# Patient Record
Sex: Female | Born: 1955 | Race: White | Hispanic: No | Smoking: Former smoker
Health system: Southern US, Community
[De-identification: ages and names within clinical notes are randomized; demographics above are authoritative.]

## PROBLEM LIST (undated history)

## (undated) DIAGNOSIS — M48061 Spinal stenosis, lumbar region without neurogenic claudication: Secondary | ICD-10-CM

## (undated) DIAGNOSIS — C4490 Unspecified malignant neoplasm of skin, unspecified: Secondary | ICD-10-CM

## (undated) DIAGNOSIS — K219 Gastro-esophageal reflux disease without esophagitis: Secondary | ICD-10-CM

## (undated) DIAGNOSIS — F32A Depression, unspecified: Secondary | ICD-10-CM

## (undated) DIAGNOSIS — K649 Unspecified hemorrhoids: Secondary | ICD-10-CM

## (undated) DIAGNOSIS — F329 Major depressive disorder, single episode, unspecified: Secondary | ICD-10-CM

## (undated) DIAGNOSIS — F419 Anxiety disorder, unspecified: Secondary | ICD-10-CM

## (undated) DIAGNOSIS — I10 Essential (primary) hypertension: Secondary | ICD-10-CM

## (undated) DIAGNOSIS — K760 Fatty (change of) liver, not elsewhere classified: Secondary | ICD-10-CM

## (undated) DIAGNOSIS — G8929 Other chronic pain: Secondary | ICD-10-CM

## (undated) DIAGNOSIS — K5790 Diverticulosis of intestine, part unspecified, without perforation or abscess without bleeding: Secondary | ICD-10-CM

## (undated) DIAGNOSIS — R160 Hepatomegaly, not elsewhere classified: Secondary | ICD-10-CM

## (undated) DIAGNOSIS — G510 Bell's palsy: Secondary | ICD-10-CM

## (undated) DIAGNOSIS — M549 Dorsalgia, unspecified: Secondary | ICD-10-CM

## (undated) DIAGNOSIS — C439 Malignant melanoma of skin, unspecified: Secondary | ICD-10-CM

## (undated) HISTORY — PX: MELANOMA EXCISION: SHX5266

## (undated) HISTORY — PX: LEG SURGERY: SHX1003

## (undated) HISTORY — DX: Gastro-esophageal reflux disease without esophagitis: K21.9

## (undated) HISTORY — DX: Malignant melanoma of skin, unspecified: C43.9

## (undated) HISTORY — DX: Other chronic pain: G89.29

## (undated) HISTORY — DX: Unspecified malignant neoplasm of skin, unspecified: C44.90

## (undated) HISTORY — DX: Bell's palsy: G51.0

## (undated) HISTORY — DX: Dorsalgia, unspecified: M54.9

## (undated) HISTORY — DX: Fatty (change of) liver, not elsewhere classified: K76.0

## (undated) HISTORY — DX: Hepatomegaly, not elsewhere classified: R16.0

---

## 2002-05-18 ENCOUNTER — Encounter: Payer: Self-pay | Admitting: Family Medicine

## 2002-05-18 ENCOUNTER — Ambulatory Visit (HOSPITAL_COMMUNITY): Admission: RE | Admit: 2002-05-18 | Discharge: 2002-05-18 | Payer: Self-pay | Admitting: Family Medicine

## 2005-07-22 ENCOUNTER — Ambulatory Visit (HOSPITAL_COMMUNITY): Admission: RE | Admit: 2005-07-22 | Discharge: 2005-07-22 | Payer: Self-pay | Admitting: Family Medicine

## 2005-11-24 ENCOUNTER — Emergency Department (HOSPITAL_COMMUNITY): Admission: EM | Admit: 2005-11-24 | Discharge: 2005-11-24 | Payer: Self-pay | Admitting: Emergency Medicine

## 2007-07-28 DIAGNOSIS — C439 Malignant melanoma of skin, unspecified: Secondary | ICD-10-CM

## 2007-07-28 HISTORY — DX: Malignant melanoma of skin, unspecified: C43.9

## 2007-08-18 ENCOUNTER — Ambulatory Visit (HOSPITAL_COMMUNITY): Admission: RE | Admit: 2007-08-18 | Discharge: 2007-08-18 | Payer: Self-pay | Admitting: Family Medicine

## 2007-10-26 ENCOUNTER — Ambulatory Visit (HOSPITAL_COMMUNITY): Admission: RE | Admit: 2007-10-26 | Discharge: 2007-10-26 | Payer: Self-pay | Admitting: Family Medicine

## 2007-11-23 ENCOUNTER — Ambulatory Visit (HOSPITAL_COMMUNITY): Admission: RE | Admit: 2007-11-23 | Discharge: 2007-11-23 | Payer: Self-pay | Admitting: Dermatology

## 2008-09-11 ENCOUNTER — Ambulatory Visit (HOSPITAL_COMMUNITY): Admission: RE | Admit: 2008-09-11 | Discharge: 2008-09-11 | Payer: Self-pay | Admitting: Family Medicine

## 2009-06-19 ENCOUNTER — Inpatient Hospital Stay (HOSPITAL_COMMUNITY): Admission: EM | Admit: 2009-06-19 | Discharge: 2009-06-28 | Payer: Self-pay | Admitting: Emergency Medicine

## 2010-08-23 ENCOUNTER — Emergency Department (HOSPITAL_COMMUNITY)
Admission: EM | Admit: 2010-08-23 | Discharge: 2010-08-23 | Payer: Self-pay | Source: Home / Self Care | Admitting: Emergency Medicine

## 2010-08-23 LAB — CBC
MCH: 29.7 pg (ref 26.0–34.0)
MCHC: 35.2 g/dL (ref 30.0–36.0)
MCV: 84.5 fL (ref 78.0–100.0)
Platelets: 268 10*3/uL (ref 150–400)
RDW: 13.2 % (ref 11.5–15.5)

## 2010-08-23 LAB — BASIC METABOLIC PANEL
BUN: 15 mg/dL (ref 6–23)
Creatinine, Ser: 1.02 mg/dL (ref 0.4–1.2)
GFR calc non Af Amer: 57 mL/min — ABNORMAL LOW (ref >60)

## 2010-08-23 LAB — DIFFERENTIAL
Eosinophils Absolute: 0 10*3/uL (ref 0.0–0.7)
Eosinophils Relative: 0 % (ref 0–5)
Lymphs Abs: 2 10*3/uL (ref 0.7–4.0)
Monocytes Relative: 6 % (ref 3–12)

## 2010-10-28 LAB — GLUCOSE, CAPILLARY
Glucose-Capillary: 121 mg/dL — ABNORMAL HIGH (ref 70–99)
Glucose-Capillary: 125 mg/dL — ABNORMAL HIGH (ref 70–99)
Glucose-Capillary: 127 mg/dL — ABNORMAL HIGH (ref 70–99)
Glucose-Capillary: 130 mg/dL — ABNORMAL HIGH (ref 70–99)
Glucose-Capillary: 141 mg/dL — ABNORMAL HIGH (ref 70–99)
Glucose-Capillary: 151 mg/dL — ABNORMAL HIGH (ref 70–99)

## 2010-10-28 LAB — BASIC METABOLIC PANEL
CO2: 28 mEq/L (ref 19–32)
Calcium: 10.4 mg/dL (ref 8.4–10.5)
Creatinine, Ser: 0.92 mg/dL (ref 0.4–1.2)
GFR calc Af Amer: >60 mL/min (ref >60)

## 2010-10-28 LAB — CBC
MCHC: 33.7 g/dL (ref 30.0–36.0)
Platelets: 473 10*3/uL — ABNORMAL HIGH (ref 150–400)
RBC: 4.11 MIL/uL (ref 3.87–5.11)
WBC: 7.4 10*3/uL (ref 4.0–10.5)

## 2010-10-28 LAB — DIFFERENTIAL
Basophils Relative: 1 % (ref 0–1)
Monocytes Relative: 9 % (ref 3–12)
Neutro Abs: 4.4 10*3/uL (ref 1.7–7.7)
Neutrophils Relative %: 60 % (ref 43–77)

## 2010-10-29 LAB — GLUCOSE, CAPILLARY
Glucose-Capillary: 107 mg/dL — ABNORMAL HIGH (ref 70–99)
Glucose-Capillary: 109 mg/dL — ABNORMAL HIGH (ref 70–99)
Glucose-Capillary: 110 mg/dL — ABNORMAL HIGH (ref 70–99)
Glucose-Capillary: 115 mg/dL — ABNORMAL HIGH (ref 70–99)
Glucose-Capillary: 124 mg/dL — ABNORMAL HIGH (ref 70–99)
Glucose-Capillary: 130 mg/dL — ABNORMAL HIGH (ref 70–99)
Glucose-Capillary: 137 mg/dL — ABNORMAL HIGH (ref 70–99)
Glucose-Capillary: 143 mg/dL — ABNORMAL HIGH (ref 70–99)
Glucose-Capillary: 144 mg/dL — ABNORMAL HIGH (ref 70–99)
Glucose-Capillary: 148 mg/dL — ABNORMAL HIGH (ref 70–99)
Glucose-Capillary: 94 mg/dL (ref 70–99)

## 2010-10-29 LAB — URINALYSIS, ROUTINE W REFLEX MICROSCOPIC
Glucose, UA: NEGATIVE mg/dL
Ketones, ur: NEGATIVE mg/dL
Protein, ur: NEGATIVE mg/dL
Urobilinogen, UA: 0.2 mg/dL (ref 0.0–1.0)

## 2010-10-29 LAB — CBC
HCT: 32.6 % — ABNORMAL LOW (ref 36.0–46.0)
HCT: 36 % (ref 36.0–46.0)
Hemoglobin: 11.2 g/dL — ABNORMAL LOW (ref 12.0–15.0)
Hemoglobin: 11.8 g/dL — ABNORMAL LOW (ref 12.0–15.0)
Hemoglobin: 11.8 g/dL — ABNORMAL LOW (ref 12.0–15.0)
Hemoglobin: 12.5 g/dL (ref 12.0–15.0)
MCHC: 33.7 g/dL (ref 30.0–36.0)
MCHC: 34.1 g/dL (ref 30.0–36.0)
MCHC: 34.6 g/dL (ref 30.0–36.0)
MCV: 85.4 fL (ref 78.0–100.0)
MCV: 86.4 fL (ref 78.0–100.0)
MCV: 86.9 fL (ref 78.0–100.0)
MCV: 87.1 fL (ref 78.0–100.0)
MCV: 87.4 fL (ref 78.0–100.0)
Platelets: 395 10*3/uL (ref 150–400)
Platelets: 418 10*3/uL — ABNORMAL HIGH (ref 150–400)
RBC: 3.94 MIL/uL (ref 3.87–5.11)
RBC: 3.97 MIL/uL (ref 3.87–5.11)
RBC: 4.22 MIL/uL (ref 3.87–5.11)
RDW: 13 % (ref 11.5–15.5)
RDW: 13.1 % (ref 11.5–15.5)
RDW: 13.3 % (ref 11.5–15.5)
RDW: 13.4 % (ref 11.5–15.5)
RDW: 13.4 % (ref 11.5–15.5)
WBC: 8.8 10*3/uL (ref 4.0–10.5)
WBC: 8.9 10*3/uL (ref 4.0–10.5)
WBC: 9 10*3/uL (ref 4.0–10.5)
WBC: 9.8 10*3/uL (ref 4.0–10.5)

## 2010-10-29 LAB — COMPREHENSIVE METABOLIC PANEL
Alkaline Phosphatase: 49 U/L (ref 39–117)
BUN: 12 mg/dL (ref 6–23)
Chloride: 103 mEq/L (ref 96–112)
Creatinine, Ser: 0.83 mg/dL (ref 0.4–1.2)
Glucose, Bld: 130 mg/dL — ABNORMAL HIGH (ref 70–99)
Potassium: 3.5 mEq/L (ref 3.5–5.1)
Total Bilirubin: 0.4 mg/dL (ref 0.3–1.2)

## 2010-10-29 LAB — MRSA CULTURE: Culture: NO GROWTH

## 2010-10-29 LAB — DIFFERENTIAL
Basophils Absolute: 0 10*3/uL (ref 0.0–0.1)
Basophils Absolute: 0.1 10*3/uL (ref 0.0–0.1)
Basophils Absolute: 0.1 10*3/uL (ref 0.0–0.1)
Basophils Absolute: 0.1 10*3/uL (ref 0.0–0.1)
Basophils Absolute: 0.1 10*3/uL (ref 0.0–0.1)
Basophils Absolute: 0.1 10*3/uL (ref 0.0–0.1)
Basophils Relative: 1 % (ref 0–1)
Basophils Relative: 1 % (ref 0–1)
Basophils Relative: 1 % (ref 0–1)
Basophils Relative: 1 % (ref 0–1)
Basophils Relative: 1 % (ref 0–1)
Eosinophils Absolute: 0.5 10*3/uL (ref 0.0–0.7)
Eosinophils Absolute: 0.6 10*3/uL (ref 0.0–0.7)
Eosinophils Absolute: 0.6 10*3/uL (ref 0.0–0.7)
Lymphocytes Relative: 17 % (ref 12–46)
Lymphocytes Relative: 21 % (ref 12–46)
Lymphocytes Relative: 23 % (ref 12–46)
Lymphocytes Relative: 25 % (ref 12–46)
Lymphs Abs: 2.3 10*3/uL (ref 0.7–4.0)
Monocytes Absolute: 0.6 10*3/uL (ref 0.1–1.0)
Monocytes Absolute: 0.8 10*3/uL (ref 0.1–1.0)
Monocytes Relative: 10 % (ref 3–12)
Monocytes Relative: 7 % (ref 3–12)
Neutro Abs: 4.2 10*3/uL (ref 1.7–7.7)
Neutro Abs: 5.5 10*3/uL (ref 1.7–7.7)
Neutro Abs: 5.6 10*3/uL (ref 1.7–7.7)
Neutro Abs: 5.6 10*3/uL (ref 1.7–7.7)
Neutro Abs: 6.5 10*3/uL (ref 1.7–7.7)
Neutro Abs: 6.8 10*3/uL (ref 1.7–7.7)
Neutrophils Relative %: 51 % (ref 43–77)
Neutrophils Relative %: 62 % (ref 43–77)
Neutrophils Relative %: 63 % (ref 43–77)
Neutrophils Relative %: 63 % (ref 43–77)
Neutrophils Relative %: 63 % (ref 43–77)
Neutrophils Relative %: 74 % (ref 43–77)

## 2010-10-29 LAB — URINE CULTURE
Colony Count: NO GROWTH
Culture: NO GROWTH
Special Requests: NEGATIVE

## 2010-10-29 LAB — BASIC METABOLIC PANEL
BUN: 12 mg/dL (ref 6–23)
CO2: 23 mEq/L (ref 19–32)
CO2: 28 mEq/L (ref 19–32)
CO2: 29 mEq/L (ref 19–32)
Calcium: 8.7 mg/dL (ref 8.4–10.5)
Calcium: 8.8 mg/dL (ref 8.4–10.5)
Calcium: 9.1 mg/dL (ref 8.4–10.5)
Calcium: 9.4 mg/dL (ref 8.4–10.5)
Chloride: 104 mEq/L (ref 96–112)
Chloride: 105 mEq/L (ref 96–112)
Chloride: 99 mEq/L (ref 96–112)
Creatinine, Ser: 0.84 mg/dL (ref 0.4–1.2)
Creatinine, Ser: 0.91 mg/dL (ref 0.4–1.2)
Creatinine, Ser: 0.94 mg/dL (ref 0.4–1.2)
Creatinine, Ser: 0.97 mg/dL (ref 0.4–1.2)
GFR calc Af Amer: >60 mL/min (ref >60)
GFR calc Af Amer: >60 mL/min (ref >60)
GFR calc Af Amer: >60 mL/min (ref >60)
GFR calc Af Amer: >60 mL/min (ref >60)
GFR calc Af Amer: >60 mL/min (ref >60)
GFR calc non Af Amer: 54 mL/min — ABNORMAL LOW (ref >60)
GFR calc non Af Amer: >60 mL/min (ref >60)
Glucose, Bld: 123 mg/dL — ABNORMAL HIGH (ref 70–99)
Glucose, Bld: 127 mg/dL — ABNORMAL HIGH (ref 70–99)
Glucose, Bld: 133 mg/dL — ABNORMAL HIGH (ref 70–99)
Glucose, Bld: 151 mg/dL — ABNORMAL HIGH (ref 70–99)
Potassium: 3.3 mEq/L — ABNORMAL LOW (ref 3.5–5.1)
Potassium: 4.1 mEq/L (ref 3.5–5.1)
Sodium: 137 mEq/L (ref 135–145)
Sodium: 140 mEq/L (ref 135–145)

## 2010-10-29 LAB — CULTURE, BLOOD (ROUTINE X 2)
Culture: NO GROWTH
Culture: NO GROWTH
Report Status: 11292010

## 2010-10-29 LAB — PROTIME-INR
INR: 1.18 (ref 0.00–1.49)
Prothrombin Time: 14.9 seconds (ref 11.6–15.2)

## 2010-10-29 LAB — VANCOMYCIN, TROUGH: Vancomycin Tr: 22.6 ug/mL — ABNORMAL HIGH (ref 10.0–20.0)

## 2010-10-29 LAB — APTT: aPTT: 53 seconds — ABNORMAL HIGH (ref 24–37)

## 2010-10-29 LAB — HEMOGLOBIN A1C: Hgb A1c MFr Bld: 6.3 % — ABNORMAL HIGH (ref 4.6–6.1)

## 2011-01-08 ENCOUNTER — Emergency Department (HOSPITAL_COMMUNITY): Payer: Self-pay

## 2011-01-08 ENCOUNTER — Ambulatory Visit (HOSPITAL_COMMUNITY)
Admit: 2011-01-08 | Discharge: 2011-01-08 | Disposition: A | Discharge status: Home / Self Care | Payer: Self-pay | Attending: Emergency Medicine | Admitting: Emergency Medicine

## 2011-01-08 ENCOUNTER — Emergency Department (HOSPITAL_COMMUNITY)
Admission: EM | Admit: 2011-01-08 | Discharge: 2011-01-08 | Disposition: A | Discharge status: Home / Self Care | Payer: Self-pay | Attending: Emergency Medicine | Admitting: Emergency Medicine

## 2011-01-08 ENCOUNTER — Encounter (HOSPITAL_COMMUNITY): Payer: Self-pay | Admitting: Radiology

## 2011-01-08 DIAGNOSIS — E119 Type 2 diabetes mellitus without complications: Secondary | ICD-10-CM | POA: Insufficient documentation

## 2011-01-08 DIAGNOSIS — I10 Essential (primary) hypertension: Secondary | ICD-10-CM | POA: Insufficient documentation

## 2011-01-08 DIAGNOSIS — G510 Bell's palsy: Secondary | ICD-10-CM | POA: Insufficient documentation

## 2011-01-08 DIAGNOSIS — R1011 Right upper quadrant pain: Secondary | ICD-10-CM | POA: Insufficient documentation

## 2011-01-08 DIAGNOSIS — R29810 Facial weakness: Secondary | ICD-10-CM | POA: Insufficient documentation

## 2011-01-08 HISTORY — DX: Bell's palsy: G51.0

## 2011-01-08 HISTORY — DX: Essential (primary) hypertension: I10

## 2011-01-08 LAB — DIFFERENTIAL
Basophils Absolute: 0.1 10*3/uL (ref 0.0–0.1)
Basophils Absolute: 0 10*3/uL (ref 0.0–0.1)
Basophils Relative: 0 % (ref 0–1)
Eosinophils Absolute: 0 10*3/uL (ref 0.0–0.7)
Eosinophils Relative: 6 % — ABNORMAL HIGH (ref 0–5)
Eosinophils Relative: 0 % (ref 0–5)
Lymphocytes Relative: 30 % (ref 12–46)
Lymphs Abs: 2.3 10*3/uL (ref 0.7–4.0)
Monocytes Absolute: 0.9 10*3/uL (ref 0.1–1.0)
Monocytes Relative: 11 % (ref 3–12)
Neutro Abs: 4 10*3/uL (ref 1.7–7.7)

## 2011-01-08 LAB — URINALYSIS, ROUTINE W REFLEX MICROSCOPIC
Bilirubin Urine: NEGATIVE
Hgb urine dipstick: NEGATIVE
Nitrite: NEGATIVE
Protein, ur: NEGATIVE mg/dL
Urobilinogen, UA: 0.2 mg/dL (ref 0.0–1.0)

## 2011-01-08 LAB — CBC
HCT: 39.1 % (ref 36.0–46.0)
Hemoglobin: 13.7 g/dL (ref 12.0–15.0)
MCH: 28.9 pg (ref 26.0–34.0)
MCHC: 35 g/dL (ref 30.0–36.0)
MCV: 82.5 fL (ref 78.0–100.0)
MCV: 86.9 fL (ref 78.0–100.0)
Platelets: 376 10*3/uL (ref 150–400)
RDW: 13.2 % (ref 11.5–15.5)
RDW: 13.1 % (ref 11.5–15.5)
WBC: 9.5 10*3/uL (ref 4.0–10.5)

## 2011-01-08 LAB — COMPREHENSIVE METABOLIC PANEL
Alkaline Phosphatase: 56 U/L (ref 39–117)
BUN: 14 mg/dL (ref 6–23)
Chloride: 98 mEq/L (ref 96–112)
GFR calc Af Amer: >60 mL/min (ref >60)
Glucose, Bld: 157 mg/dL — ABNORMAL HIGH (ref 70–99)
Potassium: 4 mEq/L (ref 3.5–5.1)
Total Bilirubin: 0.3 mg/dL (ref 0.3–1.2)

## 2011-01-08 LAB — BASIC METABOLIC PANEL
BUN: 13 mg/dL (ref 6–23)
Creatinine, Ser: 0.77 mg/dL (ref 0.4–1.2)
GFR calc non Af Amer: >60 mL/min (ref >60)
Glucose, Bld: 136 mg/dL — ABNORMAL HIGH (ref 70–99)
Potassium: 3.5 mEq/L (ref 3.5–5.1)

## 2011-01-08 LAB — URINE MICROSCOPIC-ADD ON

## 2011-01-08 LAB — HEPATIC FUNCTION PANEL
AST: 43 U/L — ABNORMAL HIGH (ref 0–37)
Bilirubin, Direct: 0.1 mg/dL (ref 0.0–0.3)
Indirect Bilirubin: 0.2 mg/dL — ABNORMAL LOW (ref 0.3–0.9)
Total Bilirubin: 0.3 mg/dL (ref 0.3–1.2)

## 2011-01-08 LAB — LIPASE, BLOOD: Lipase: 34 U/L (ref 11–59)

## 2011-01-08 MED ORDER — IOHEXOL 300 MG/ML  SOLN
100.0000 mL | Freq: Once | INTRAMUSCULAR | Status: AC | PRN
  Administered 2011-01-08: 100 mL via INTRAVENOUS

## 2011-01-29 ENCOUNTER — Encounter: Payer: Self-pay | Admitting: Urgent Care

## 2011-01-29 ENCOUNTER — Ambulatory Visit (INDEPENDENT_AMBULATORY_CARE_PROVIDER_SITE_OTHER): Payer: Self-pay | Admitting: Urgent Care

## 2011-01-29 VITALS — BP 152/88 | HR 98 | Temp 97.0°F | Ht 62.0 in | Wt 251.2 lb

## 2011-01-29 DIAGNOSIS — F411 Generalized anxiety disorder: Secondary | ICD-10-CM

## 2011-01-29 DIAGNOSIS — R7402 Elevation of levels of lactic acid dehydrogenase (LDH): Secondary | ICD-10-CM

## 2011-01-29 DIAGNOSIS — R1310 Dysphagia, unspecified: Secondary | ICD-10-CM | POA: Insufficient documentation

## 2011-01-29 DIAGNOSIS — R1011 Right upper quadrant pain: Secondary | ICD-10-CM

## 2011-01-29 DIAGNOSIS — K625 Hemorrhage of anus and rectum: Secondary | ICD-10-CM

## 2011-01-29 DIAGNOSIS — F419 Anxiety disorder, unspecified: Secondary | ICD-10-CM | POA: Insufficient documentation

## 2011-01-29 DIAGNOSIS — R7401 Elevation of levels of liver transaminase levels: Secondary | ICD-10-CM

## 2011-01-29 DIAGNOSIS — K219 Gastro-esophageal reflux disease without esophagitis: Secondary | ICD-10-CM

## 2011-01-29 DIAGNOSIS — R160 Hepatomegaly, not elsewhere classified: Secondary | ICD-10-CM

## 2011-01-29 MED ORDER — ALPRAZOLAM 0.25 MG PO TABS: 0.2500 mg | ORAL_TABLET | Freq: Three times a day (TID) | ORAL | Status: DC | PRN

## 2011-01-29 MED ORDER — HYDROCODONE-ACETAMINOPHEN 5-500 MG PO TABS: 1.0000 | ORAL_TABLET | Freq: Four times a day (QID) | ORAL | Status: DC | PRN

## 2011-01-29 NOTE — Patient Instructions (Addendum)
Avoid aleve No alcohol Use xanax & vicodin sparingly To ER if severe pain or bleeding

## 2011-01-29 NOTE — Assessment & Plan Note (Signed)
See RUQ pain

## 2011-01-29 NOTE — Assessment & Plan Note (Addendum)
In setting of fatty liver, mildly elevated transaminases.  ? NASH vs. Viral hepatitis (few risk factors) vs. Recurrent melanoma.    Check acute hepatitis panel

## 2011-01-29 NOTE — Progress Notes (Signed)
Cc to [facility] 

## 2011-01-29 NOTE — Assessment & Plan Note (Signed)
?   underlying malignancy.  Urged pt to complete PET asap.

## 2011-01-29 NOTE — Assessment & Plan Note (Signed)
See hepatomegaly

## 2011-01-29 NOTE — Assessment & Plan Note (Signed)
She has intermittent solid food dysphagia in setting of chronic GERD & RUQ pain.  Will need EGD w/ possible esophageal dilation to r/o complicated GERD with esophageal web, ring or stricture.  I have discussed risks & benefits which include, but are not limited to, bleeding, infection, perforation & drug reaction.  The patient agrees with this plan & written consent will be obtained.

## 2011-01-29 NOTE — Assessment & Plan Note (Signed)
Pt gives detailed hx of Ulcerative colitis on 2 previous colonoscopies but denies any maintenance therapy.  Has not had colonoscopy in several yrs.  She is now having large volume bleeding.  Colonoscopy w/ Dr Darrick Penna to r/o colorectal ca, polyp, ulcerative colitis, diverticular bleeding,  NSAID-induced changes, or benign anorectal source.  I have discussed risks & benefits which include, but are not limited to, bleeding, infection, perforation & drug reaction.  The patient agrees with this plan & written consent will be obtained.

## 2011-01-29 NOTE — Assessment & Plan Note (Addendum)
Shelby Morgan is a 55 y.o. caucasian female w/ almost 1 yr hx of right upper quadrant pain that radiates to her back & shoulder.  Recent CT shows hepatomegaly & fatty liver.  She also had mildly elevated AST & ALT.  She has typical GERD symptoms as well.  She has hx melanoma 3 yrs ago & recent CT also showed adenopathy abd/pelvis.  She is supposed to have PET for further evaluation, but has postponed due to awaiting insurance coverage.  She has a multitude of GI concerns today.    Etiology of pain is unclear.  Differentials include pain secondary to hepatomegaly, gallbladder disease, upper GI source such as PUD, gastritis or malignancy.  There is concern for recurrent melanoma.

## 2011-01-29 NOTE — Progress Notes (Signed)
Referring Provider: Kizzie Furnish [facility]) Primary Care Physician:  [facility] Primary Gastroenterologist:  Dr. Darrick Penna  Chief Complaint  Patient presents with  . Abdominal Pain  . Rectal Bleeding    HPI:  Shelby Morgan is a 55 y.o. female here as a referral from [facility] for RUQ pain, blood in stools & diarrhea.  Pt gives he of ER visit for Bell's Palsy 6/14.  Had labs & CT done at that time.  C/o RUQ pain, radiates to right shoulder x 1 yr.  "Felt like gallbladder"  Denies nausea or vomiting.  Constant pain 7/10.  Taking ALEVE once or twice daily.  Was taking ASA QID prior to this.  C/o heartburn & indigestion.  Takes OTC acid reducer (Zantac 150mg ) prn & TUMS prn -seems to help some.  C/o dysphagia w/ meats & breads.  Feels like stuck in mid-esophagus.    BM loose once daily w/ blood x 6 days.  Denies any recent diarrhea.  Hx chronic constipation.  Large amts bright red blood without clots.  C/o sores in mouth.  Gives hx being dx with Ulcerative colitis yrs ago on 2 colonoscopies by Dr Linna Darner.  She did not have any follow-up or long-term treatment for this.   Hx melanoma right knee 11/10/07 Dr. Margo Aye.  CT ABD/pelvis w/ IV contrast  01/08/2011->Hepatomegaly and hepatic steatosis, Mild lower thoracic and retroperitoneal abdominal adenopathy.  She is supposed to have PET scan for further evaluation, but as not arranged this because she does not have insurance.  LFTS 6/14 show AST 43 & ALT 58 (mildly elevated) & otherwise normal.  CBC, lipase & CMP normal except glucose 136 and CALCIUM 11.  No hx hepatitis.  No jaundice.  No hx IV drug use.  Occasional glasses of wine.  No tattoos or blood transfusion.  Less than 5 lifetime partners.  No chronic pain meds. Past Medical History  Diagnosis Date  . Diabetes mellitus   . Hypertension   . Bell's palsy 01/08/2011  . Hepatomegaly   . Chronic back pain   . Melanoma 2009    Dr Margo Aye, Stage 2,  required only surgery   Past Surgical History  Procedure Date  . Cesarean section   . Leg surgery     plates/screws/hx fx   Current Outpatient Prescriptions  Medication Sig Dispense Refill  . hydrochlorothiazide 25 MG tablet Take 25 mg by mouth daily.        . metformin (FORTAMET) 500 MG (OSM) 24 hr tablet Take 500 mg by mouth daily with breakfast.        . Multiple Vitamin (MULTIVITAMIN) capsule Take 1 capsule by mouth daily.        . naproxen sodium (ANAPROX) 220 MG tablet Take 220 mg by mouth 2 (two) times daily with a meal.        . ranitidine (ZANTAC) 150 MG tablet Take 150 mg by mouth 2 (two) times daily.        Marland Kitchen UNABLE TO FIND Reishi Mushroom 460 mg bid        . ALPRAZolam (XANAX) 0.25 MG tablet Take 1 tablet (0.25 mg total) by mouth 3 (three) times daily as needed for anxiety.  30 tablet  0  . HYDROcodone-acetaminophen (VICODIN) 5-500 MG per tablet Take 1 tablet by mouth every 6 (six) hours as needed for pain.  20 tablet  0  . DISCONTD: oxyCODONE-acetaminophen (PERCOCET) 5-325 MG per tablet Take 1 tablet by  mouth every 4 (four) hours as needed. Stop 2 weeks ago        . DISCONTD: predniSONE (DELTASONE) 20 MG tablet Take 20 mg by mouth daily. Stop 2 weeks ago         Allergies as of 01/29/2011 - Review Complete 01/29/2011  Allergen Reaction Noted  . Codeine Nausea And Vomiting 01/08/2011   Family History:  There is no known family history of colorectal carcinoma , liver disease, or inflammatory bowel disease.  History   Social History  . Marital Status: Married    Spouse Name: N/A    Number of Children: 1  . Years of Education: N/A   Occupational History  . [employer]     part time   Social History Main Topics  . Smoking status: Never Smoker   . Smokeless tobacco: Not on file  . Alcohol Use: Yes     wine 2-3 glasses per weekend  . Drug Use: No  . Sexually Active: Not on file   Review of Systems: Gen: Denies any fever, chills, sweats, anorexia, fatigue,  weakness, malaise, weight loss, and sleep disorder CV: Denies chest pain, angina, palpitations, syncope, orthopnea, PND, peripheral edema, and claudication. Resp: Denies dyspnea at rest, dyspnea with exercise, cough, sputum, wheezing, coughing up blood, and pleurisy. GI: Denies vomiting blood, jaundice, and fecal incontinence.   Denies dysphagia or odynophagia. GU : Denies urinary burning, blood in urine, urinary frequency, urinary hesitancy, nocturnal urination, and urinary incontinence. MS: c/o chronic back pain & degenerative disc disease.   Denies muscle weakness, cramps, atrophy.  Derm: RLE erythema-recent cellulitis 07/2010.  Denies itching, hives, moles, warts, or unhealing ulcers.  Psych: Denies depression.  C/o significant anxiety.  Not on meds "Afraid I have cancer"  Denies memory loss, suicidal ideation, hallucinations, paranoia, and confusion. Heme: Denies bruising, bleeding, and enlarged lymph nodes. Neuro:  Recent Bell's palsy.  Rash, numbness, tingling around mouth  Physical Exam: BP 152/88  Pulse 98  Temp(Src) 97 F (36.1 C) (Temporal)  Ht 5\' 2"  (1.575 m)  Wt 251 lb 3.2 oz (113.944 kg)  BMI 45.95 kg/m2 General:   Alert,  Well-developed, obese, pleasant and cooperative in NAD Head:  Normocephalic and atraumatic. Eyes:  Sclera clear, no icterus.   Conjunctiva pink. Ears:  Normal auditory acuity. Nose:  No deformity, discharge,  or lesions. Mouth:  No deformity or lesions, dentition normal.  Dry peeling peri-oral skin. Neck:  Supple; no masses or thyromegaly. Lungs:  Clear throughout to auscultation.   No wheezes, crackles, or rhonchi. No acute distress. Heart:  Regular rate and rhythm; no murmurs, clicks, rubs,  or gallops. Abdomen:  Soft, nontender and nondistended. Erythematous, cool to touch 3x1 cm rash intertrigo, slightly raised just below umbilicus.  No exudates.   +hepatomegaly liver palpable 5FB below RCM, TTP.  Unable to palpate splenomegaly.  No masses or hernias  noted. Normal bowel sounds, without guarding, and without rebound.  Exam limited given pt's body habitus.   Rectal:  Deferred until time of colonoscopy.   Msk:  Symmetrical without gross deformities. Normal posture. Pulses:  Normal pulses noted. Extremities:  Trace pretibial edema bilat.  Mild erythema bilat pretibial area. Neurologic:  Alert and  oriented x4;  grossly normal neurologically. Skin: see abd/extremities Cervical Nodes:  No significant cervical adenopathy. Psych:  Alert and cooperative. Normal mood and affect.

## 2011-01-29 NOTE — Assessment & Plan Note (Addendum)
Pt significantly anxious about her health.  Will give 1 time rx for xanax, however she is instructed to get all further meds & work-up from her PCP.  She agrees that this is not for long-term use and has not been on anxiolytics previously.   Avoid aleve No alcohol Use xanax & vicodin sparingly To ER if severe pain or bleeding

## 2011-01-30 LAB — HEPATITIS PANEL, ACUTE
Hep A IgM: NEGATIVE
Hep B C IgM: NEGATIVE
Hepatitis B Surface Ag: NEGATIVE

## 2011-02-03 ENCOUNTER — Telehealth: Payer: Self-pay

## 2011-02-03 NOTE — Telephone Encounter (Signed)
NOTED

## 2011-02-03 NOTE — Telephone Encounter (Signed)
Pt had to reschedule her procedures from 02/16/2011 til 03/09/2011 for insurance purposes. Shelby Morgan is aware.

## 2011-02-16 ENCOUNTER — Encounter: Payer: Self-pay | Admitting: Gastroenterology

## 2011-02-19 NOTE — Progress Notes (Signed)
Pt most likely has HM & elevated liver enzymes 2o to fatty liver disease/MORBID OBESITY. Doubt pt has recurrent melanoma or GB disease with nl HFP. Agree with endoscopy-NEEDS TO BE DONE IN THE OR DUE TO POLYPHARMACY AND PT'S BMI> 45.  RUQ PAIN most likely 2o to functional abd pain. If EGD nl, pt needs to see a [facility] for anxiety.

## 2011-02-23 NOTE — Progress Notes (Signed)
Please be sure procedures are scheduled in OR per [facility]'s note.

## 2011-02-23 NOTE — Progress Notes (Signed)
Pt was rescheduled to OR by Durward Mallard.

## 2011-02-24 ENCOUNTER — Telehealth: Payer: Self-pay

## 2011-02-24 NOTE — Telephone Encounter (Signed)
Reviewed pt's meds. She had to reschedule procedure due to new insurance coverage. She has not had any new medical problems.

## 2011-02-25 NOTE — Telephone Encounter (Signed)
Reviewed. Proceed w/ tcs/egd.

## 2011-02-26 NOTE — Telephone Encounter (Signed)
noted 

## 2011-03-02 ENCOUNTER — Ambulatory Visit: Payer: Self-pay | Admitting: Family Medicine

## 2011-03-04 ENCOUNTER — Encounter (HOSPITAL_COMMUNITY)
Admission: RE | Admit: 2011-03-04 | Discharge: 2011-03-04 | Disposition: A | Discharge status: Home / Self Care | Payer: PRIVATE HEALTH INSURANCE | Source: Ambulatory Visit | Attending: Gastroenterology | Admitting: Gastroenterology

## 2011-03-04 ENCOUNTER — Other Ambulatory Visit: Payer: Self-pay

## 2011-03-04 ENCOUNTER — Encounter (HOSPITAL_COMMUNITY): Payer: Self-pay

## 2011-03-04 HISTORY — DX: Depression, unspecified: F32.A

## 2011-03-04 HISTORY — DX: Anxiety disorder, unspecified: F41.9

## 2011-03-04 HISTORY — DX: Major depressive disorder, single episode, unspecified: F32.9

## 2011-03-04 LAB — BASIC METABOLIC PANEL
CO2: 26 mEq/L (ref 19–32)
Calcium: 10.2 mg/dL (ref 8.4–10.5)
Creatinine, Ser: 0.72 mg/dL (ref 0.50–1.10)
GFR calc Af Amer: >60 mL/min (ref >60)
GFR calc non Af Amer: >60 mL/min (ref >60)

## 2011-03-04 LAB — CBC
MCH: 28.4 pg (ref 26.0–34.0)
MCHC: 32.7 g/dL (ref 30.0–36.0)
MCV: 86.7 fL (ref 78.0–100.0)
Platelets: 317 10*3/uL (ref 150–400)
RDW: 12.9 % (ref 11.5–15.5)

## 2011-03-04 MED ORDER — LACTATED RINGERS IV SOLN: INTRAVENOUS | Status: DC

## 2011-03-04 NOTE — Patient Instructions (Addendum)
[address]  03/04/2011   Your procedure is scheduled on: 03/09/11  Report to [facility] at [place] AM.  Call this number if you have problems the morning of surgery: [phone]   Remember:   Do not eat food:After Midnight.  Do not drink clear liquids: After Midnight.  Take these medicines the morning of surgery with A SIP OF WATER:xanax, hydrocodone, HCTZ, zantac   Do not wear jewelry, make-up or nail polish.  Do not wear lotions, powders, or perfumes. You may wear deodorant.  Do not shave 48 hours prior to surgery.  Do not bring valuables to the hospital.  Contacts, dentures or bridgework may not be worn into surgery.  Leave suitcase in the car. After surgery it may be brought to your room.  For patients admitted to the hospital, checkout time is 11:00 AM the day of discharge.   Patients discharged the day of surgery will not be allowed to drive home.  Name and phone number of your driver: family  Special Instructions: N/A   Please read over the following fact sheets that you were given: Pain Booklet, Anesthesia Post-op Instructions and Care and Recovery After Surgery   PATIENT INSTRUCTIONS POST-ANESTHESIA  IMMEDIATELY FOLLOWING SURGERY:  Do not drive or operate machinery for the first twenty four hours after surgery.  Do not make any important decisions for twenty four hours after surgery or while taking narcotic pain medications or sedatives.  If you develop intractable nausea and vomiting or a severe headache please notify your doctor immediately.  FOLLOW-UP:  Please make an appointment with your surgeon as instructed. You do not need to follow up with anesthesia unless specifically instructed to do so.  WOUND CARE INSTRUCTIONS (if applicable):  Keep a dry clean dressing on the anesthesia/puncture wound site if there is drainage.  Once the wound has quit draining you may leave it open to air.  Generally you should leave the bandage intact for twenty four hours unless there is  drainage.  If the epidural site drains for more than 36-48 hours please call the anesthesia department.  QUESTIONS?:  Please feel free to call your physician or the hospital operator if you have any questions, and they will be happy to assist you.     [facility] Anesthesia Department [address] [place] [phone]

## 2011-03-09 ENCOUNTER — Ambulatory Visit (HOSPITAL_COMMUNITY): Payer: PRIVATE HEALTH INSURANCE | Admitting: Anesthesiology

## 2011-03-09 ENCOUNTER — Encounter: Payer: Self-pay | Admitting: Gastroenterology

## 2011-03-09 ENCOUNTER — Encounter (HOSPITAL_COMMUNITY)
Admission: RE | Disposition: A | Discharge status: Home / Self Care | Payer: Self-pay | Source: Ambulatory Visit | Attending: Gastroenterology

## 2011-03-09 ENCOUNTER — Other Ambulatory Visit: Payer: Self-pay | Admitting: Gastroenterology

## 2011-03-09 ENCOUNTER — Encounter (HOSPITAL_COMMUNITY): Payer: Self-pay | Admitting: Anesthesiology

## 2011-03-09 ENCOUNTER — Encounter (HOSPITAL_COMMUNITY): Payer: Self-pay | Admitting: *Deleted

## 2011-03-09 ENCOUNTER — Ambulatory Visit (HOSPITAL_COMMUNITY)
Admission: RE | Admit: 2011-03-09 | Discharge: 2011-03-09 | Disposition: A | Discharge status: Home / Self Care | Payer: PRIVATE HEALTH INSURANCE | Source: Ambulatory Visit | Attending: Gastroenterology | Admitting: Gastroenterology

## 2011-03-09 DIAGNOSIS — K644 Residual hemorrhoidal skin tags: Secondary | ICD-10-CM | POA: Insufficient documentation

## 2011-03-09 DIAGNOSIS — Z79899 Other long term (current) drug therapy: Secondary | ICD-10-CM | POA: Insufficient documentation

## 2011-03-09 DIAGNOSIS — K648 Other hemorrhoids: Secondary | ICD-10-CM | POA: Insufficient documentation

## 2011-03-09 DIAGNOSIS — K2970 Gastritis, unspecified, without bleeding: Secondary | ICD-10-CM

## 2011-03-09 DIAGNOSIS — E119 Type 2 diabetes mellitus without complications: Secondary | ICD-10-CM | POA: Insufficient documentation

## 2011-03-09 DIAGNOSIS — K449 Diaphragmatic hernia without obstruction or gangrene: Secondary | ICD-10-CM | POA: Insufficient documentation

## 2011-03-09 DIAGNOSIS — K625 Hemorrhage of anus and rectum: Secondary | ICD-10-CM | POA: Insufficient documentation

## 2011-03-09 DIAGNOSIS — K222 Esophageal obstruction: Secondary | ICD-10-CM

## 2011-03-09 DIAGNOSIS — I10 Essential (primary) hypertension: Secondary | ICD-10-CM | POA: Insufficient documentation

## 2011-03-09 DIAGNOSIS — K2940 Chronic atrophic gastritis without bleeding: Secondary | ICD-10-CM | POA: Insufficient documentation

## 2011-03-09 DIAGNOSIS — Z01810 Encounter for preprocedural cardiovascular examination: Secondary | ICD-10-CM | POA: Insufficient documentation

## 2011-03-09 DIAGNOSIS — K2980 Duodenitis without bleeding: Secondary | ICD-10-CM | POA: Insufficient documentation

## 2011-03-09 DIAGNOSIS — Z01812 Encounter for preprocedural laboratory examination: Secondary | ICD-10-CM | POA: Insufficient documentation

## 2011-03-09 DIAGNOSIS — K2990 Gastroduodenitis, unspecified, without bleeding: Secondary | ICD-10-CM

## 2011-03-09 DIAGNOSIS — K5730 Diverticulosis of large intestine without perforation or abscess without bleeding: Secondary | ICD-10-CM | POA: Insufficient documentation

## 2011-03-09 DIAGNOSIS — R1310 Dysphagia, unspecified: Secondary | ICD-10-CM

## 2011-03-09 HISTORY — PX: SAVORY DILATION: SHX5439

## 2011-03-09 HISTORY — PX: ESOPHAGOGASTRODUODENOSCOPY: SHX5428

## 2011-03-09 HISTORY — PX: COLONOSCOPY: SHX5424

## 2011-03-09 LAB — GLUCOSE, CAPILLARY: Glucose-Capillary: 141 mg/dL — ABNORMAL HIGH (ref 70–99)

## 2011-03-09 SURGERY — COLONOSCOPY
Anesthesia: Monitor Anesthesia Care

## 2011-03-09 MED ORDER — MIDAZOLAM HCL 2 MG/2ML IJ SOLN
INTRAMUSCULAR | Status: AC
  Filled 2011-03-09: qty 2

## 2011-03-09 MED ORDER — LACTATED RINGERS IV SOLN
INTRAVENOUS | Status: DC
  Administered 2011-03-09: 08:00:00 via INTRAVENOUS

## 2011-03-09 MED ORDER — MIDAZOLAM HCL 2 MG/2ML IJ SOLN
INTRAMUSCULAR | Status: AC
  Administered 2011-03-09: 2 mg via INTRAVENOUS
  Filled 2011-03-09: qty 2

## 2011-03-09 MED ORDER — PROPOFOL 10 MG/ML IV EMUL
INTRAVENOUS | Status: AC
  Filled 2011-03-09: qty 20

## 2011-03-09 MED ORDER — FENTANYL CITRATE 0.05 MG/ML IJ SOLN
INTRAMUSCULAR | Status: DC | PRN
  Administered 2011-03-09 (×2): 25 ug via INTRAVENOUS
  Administered 2011-03-09: 50 ug via INTRAVENOUS

## 2011-03-09 MED ORDER — FENTANYL CITRATE 0.05 MG/ML IJ SOLN: 25.0000 ug | INTRAMUSCULAR | Status: DC | PRN

## 2011-03-09 MED ORDER — STERILE WATER FOR IRRIGATION IR SOLN
Status: DC | PRN
  Administered 2011-03-09: 08:00:00

## 2011-03-09 MED ORDER — MIDAZOLAM HCL 2 MG/2ML IJ SOLN
1.0000 mg | INTRAMUSCULAR | Status: DC | PRN
Start: 2011-03-09 — End: 2011-03-09
  Administered 2011-03-09 (×2): 2 mg via INTRAVENOUS

## 2011-03-09 MED ORDER — ONDANSETRON HCL 4 MG/2ML IJ SOLN
4.0000 mg | Freq: Once | INTRAMUSCULAR | Status: AC
  Administered 2011-03-09: 4 mg via INTRAVENOUS

## 2011-03-09 MED ORDER — GLYCOPYRROLATE 0.2 MG/ML IJ SOLN
0.2000 mg | Freq: Once | INTRAMUSCULAR | Status: AC
  Administered 2011-03-09: 0.2 mg via INTRAVENOUS

## 2011-03-09 MED ORDER — PROPOFOL 10 MG/ML IV EMUL
INTRAVENOUS | Status: DC | PRN
  Administered 2011-03-09: 55 ug/kg/min via INTRAVENOUS

## 2011-03-09 MED ORDER — MINERAL OIL PO OIL
TOPICAL_OIL | ORAL | Status: AC
  Filled 2011-03-09: qty 30

## 2011-03-09 MED ORDER — FENTANYL CITRATE 0.05 MG/ML IJ SOLN
INTRAMUSCULAR | Status: AC
  Filled 2011-03-09: qty 2

## 2011-03-09 MED ORDER — LACTATED RINGERS IV SOLN
INTRAVENOUS | Status: DC | PRN
  Administered 2011-03-09: 07:00:00 via INTRAVENOUS

## 2011-03-09 MED ORDER — MIDAZOLAM HCL 5 MG/5ML IJ SOLN
INTRAMUSCULAR | Status: DC | PRN
  Administered 2011-03-09: 2 mg via INTRAVENOUS

## 2011-03-09 MED ORDER — LIDOCAINE HCL (PF) 1 % IJ SOLN
INTRAMUSCULAR | Status: AC
  Filled 2011-03-09: qty 5

## 2011-03-09 MED ORDER — LIDOCAINE IN DEXTROSE 5-7.5 % IV SOLN
INTRAVENOUS | Status: AC
  Filled 2011-03-09: qty 6

## 2011-03-09 MED ORDER — GLYCOPYRROLATE 0.2 MG/ML IJ SOLN
INTRAMUSCULAR | Status: AC
  Filled 2011-03-09: qty 1

## 2011-03-09 MED ORDER — MINERAL OIL LIGHT 100 % EX OIL
TOPICAL_OIL | CUTANEOUS | Status: DC | PRN
  Administered 2011-03-09: 1 via TOPICAL

## 2011-03-09 MED ORDER — ONDANSETRON HCL 4 MG/2ML IJ SOLN
INTRAMUSCULAR | Status: AC
  Filled 2011-03-09: qty 2

## 2011-03-09 MED ORDER — LIDOCAINE HCL 1 % IJ SOLN
INTRAMUSCULAR | Status: DC | PRN
  Administered 2011-03-09: 50 mg via INTRADERMAL

## 2011-03-09 MED ORDER — ONDANSETRON HCL 4 MG/2ML IJ SOLN: 4.0000 mg | Freq: Once | INTRAMUSCULAR | Status: DC | PRN

## 2011-03-09 MED ORDER — PANTOPRAZOLE SODIUM 40 MG PO TBEC: DELAYED_RELEASE_TABLET | ORAL | Status: DC

## 2011-03-09 MED ORDER — BUTAMBEN-TETRACAINE-BENZOCAINE 2-2-14 % EX AERO
1.0000 | INHALATION_SPRAY | Freq: Once | CUTANEOUS | Status: AC
  Administered 2011-03-09: 1 via TOPICAL
  Filled 2011-03-09: qty 56

## 2011-03-09 MED ORDER — MIDAZOLAM HCL 2 MG/2ML IJ SOLN: 1.0000 mg | INTRAMUSCULAR | Status: DC | PRN

## 2011-03-09 SURGICAL SUPPLY — 25 items
BLOCK BITE 60FR ADLT L/F BLUE (MISCELLANEOUS) ×2 IMPLANT
ELECT REM PT RETURN 9FT ADLT (ELECTROSURGICAL)
ELECTRODE REM PT RTRN 9FT ADLT (ELECTROSURGICAL) IMPLANT
FCP BXJMBJMB 240X2.8X (CUTTING FORCEPS)
FLOOR PAD 36X40 (MISCELLANEOUS) ×2
FORCEP RJ3 GP 1.8X160 W-NEEDLE (CUTTING FORCEPS) IMPLANT
FORCEPS BIOP RAD 4 LRG CAP 4 (CUTTING FORCEPS) ×2 IMPLANT
FORCEPS BIOP RJ4 240 W/NDL (CUTTING FORCEPS)
FORCEPS BXJMBJMB 240X2.8X (CUTTING FORCEPS) IMPLANT
INJECTOR/SNARE I SNARE (MISCELLANEOUS) IMPLANT
LUBRICANT JELLY 4.5OZ STERILE (MISCELLANEOUS) ×1 IMPLANT
NDL SCLEROTHERAPY 25GX240 (NEEDLE) IMPLANT
NEEDLE SCLEROTHERAPY 25GX240 (NEEDLE) IMPLANT
PAD FLOOR 36X40 (MISCELLANEOUS) ×1 IMPLANT
PROBE APC STR FIRE (PROBE) IMPLANT
PROBE INJECTION GOLD (MISCELLANEOUS)
PROBE INJECTION GOLD 7FR (MISCELLANEOUS) IMPLANT
SNARE ROTATE MED OVAL 20MM (MISCELLANEOUS) IMPLANT
SNARE SHORT THROW 13M SML OVAL (MISCELLANEOUS) IMPLANT
SYR 50ML LL SCALE MARK (SYRINGE) ×1 IMPLANT
TRAP SPECIMEN MUCOUS 40CC (MISCELLANEOUS) IMPLANT
TUBING ENDO SMARTCAP (MISCELLANEOUS) ×2 IMPLANT
TUBING ENDO SMARTCAP PENTAX (MISCELLANEOUS) IMPLANT
TUBING IRRIGATION ENDOGATOR (MISCELLANEOUS) ×2 IMPLANT
WATER STERILE IRR 1000ML POUR (IV SOLUTION) ×2 IMPLANT

## 2011-03-09 NOTE — Transfer of Care (Signed)
Immediate Anesthesia Transfer of Care Note  Patient: Shelby Morgan  Procedure(s) Performed:  COLONOSCOPY - with propofol sedation at cecum 0816 procedure completed at 0830; ESOPHAGOGASTRODUODENOSCOPY (EGD) - with propofol sedation procedure began at 0835; SAVORY DILATION - with propofol sedation  savory dilator 12,13,15,16  Patient Location: PACU and NICU  Anesthesia Type: MAC  Level of Consciousness: awake, alert , oriented and patient cooperative  Airway & Oxygen Therapy: Patient Spontanous Breathing and Patient connected to face mask oxygen  Post-op Assessment: Report given to PACU RN, Post -op Vital signs reviewed and stable and Patient moving all extremities X 4  Post vital signs: Reviewed and stable  Complications: No apparent anesthesia complications

## 2011-03-09 NOTE — Anesthesia Preprocedure Evaluation (Addendum)
Anesthesia Evaluation  Name, MR# and DOB Patient awake  General Assessment Comment  Reviewed: Allergy & Precautions, H&P , NPO status , Patient's Chart, lab work & pertinent test results  History of Anesthesia Complications Negative for: history of anesthetic complications  Airway Mallampati: I  Neck ROM: Full    Dental  (+) Teeth Intact   Pulmonary    pulmonary exam normalPulmonary Exam Normal     Cardiovascular hypertension, Pt. on medications Regular Normal    Neuro/Psych    (+) Anxiety, Depression,    GI/Hepatic/Renal (+)  GERD Medicated and Controlled     Endo/Other  (+) Diabetes mellitus-, Well Controlled, Type 2, Oral Hypoglycemic Agents,     Abdominal (+) obese,   Musculoskeletal   Hematology   Peds  Reproductive/Obstetrics    Anesthesia Other Findings             Anesthesia Physical Anesthesia Plan  ASA: III  Anesthesia Plan: MAC   Post-op Pain Management:    Induction: Intravenous  Airway Management Planned: Simple Face Mask  Additional Equipment:   Intra-op Plan:   Post-operative Plan:   Informed Consent: I have reviewed the patients History and Physical, chart, labs and discussed the procedure including the risks, benefits and alternatives for the proposed anesthesia with the patient or authorized representative who has indicated his/her understanding and acceptance.     Plan Discussed with:   Anesthesia Plan Comments:         Anesthesia Quick Evaluation

## 2011-03-09 NOTE — Anesthesia Postprocedure Evaluation (Signed)
  Anesthesia Post-op Note  Patient: Shelby Morgan  Procedure(s) Performed:  COLONOSCOPY - with propofol sedation at cecum 0816 procedure completed at 0830; ESOPHAGOGASTRODUODENOSCOPY (EGD) - with propofol sedation procedure began at 0835; SAVORY DILATION - with propofol sedation  savory dilator 12,13,15,16  Patient Location: PACU  Anesthesia Type: MAC  Level of Consciousness: awake, alert , oriented and patient cooperative  Airway and Oxygen Therapy: Patient Spontanous Breathing  Post-op Pain: none  Post-op Assessment: Post-op Vital signs reviewed, Patient's Cardiovascular Status Stable, Respiratory Function Stable, No signs of Nausea or vomiting and Pain level controlled  Post-op Vital Signs: Reviewed and stable  Complications: No apparent anesthesia complications

## 2011-03-09 NOTE — H&P (Addendum)
Current Vitals       Recorded User        01/29/2011  9:58 AM  Ginger L Walker, NT           BP Pulse Temp (Src) Resp Ht Wt    152/88  98  97 F (36.1 C) (Temporal)  N/A  5\' 2"  (1.575 m)  251 lb 3.2 oz (113.944 kg)       BMI SpO2 PF LMP    45.95 kg/m2  N/A  N/A  N/A          Progress Notes     Lorenza Burton, NP  01/29/2011  1:06 PM  Signed   Referring Provider: Kizzie Furnish [facility]) Primary Care Physician:  [facility] Primary Gastroenterologist:  Dr. Darrick Penna    Chief Complaint   Patient presents with   .  Abdominal Pain   .  Rectal Bleeding      HPI:  Shelby Morgan is a 55 y.o. female here as a referral from [facility] for RUQ pain, blood in stools & diarrhea.  Pt gives he of ER visit for Bell's Palsy 6/14.  Had labs & CT done at that time.  C/o RUQ pain, radiates to right shoulder x 1 yr.  "Felt like gallbladder"  Denies nausea or vomiting.  Constant pain 7/10.  Taking ALEVE once or twice daily.  Was taking ASA QID prior to this.  C/o heartburn & indigestion.  Takes OTC acid reducer (Zantac 150mg ) prn & TUMS prn -seems to help some.  C/o dysphagia w/ meats & breads.  Feels like stuck in mid-esophagus.     BM loose once daily w/ blood x 6 days.  Denies any recent diarrhea.  Hx chronic constipation.  Large amts bright red blood without clots.  C/o sores in mouth.  Gives hx being dx with Ulcerative colitis yrs ago on 2 colonoscopies by Dr Linna Darner.  She did not have any follow-up or long-term treatment for this.    Hx melanoma right knee 11/10/07 Dr. Margo Aye.  CT ABD/pelvis w/ IV contrast  01/08/2011->Hepatomegaly and hepatic steatosis, Mild lower thoracic and retroperitoneal abdominal adenopathy.  She is supposed to have PET scan for further evaluation, but as not arranged this because she does not have insurance.   LFTS 6/14 show AST 43 & ALT 58 (mildly elevated) & otherwise normal.  CBC, lipase & CMP normal except glucose 136  and CALCIUM 11.  No hx hepatitis.  No jaundice.  No hx IV drug use.  Occasional glasses of wine.  No tattoos or blood transfusion.  Less than 5 lifetime partners.  No chronic pain meds. Past Medical History   Diagnosis  Date   .  Diabetes mellitus     .  Hypertension     .  Bell's palsy  01/08/2011   .  Hepatomegaly     .  Chronic back pain     .  Melanoma  2009       Dr Margo Aye, Stage 2, required only surgery    Past Surgical History   Procedure  Date   .  Cesarean section     .  Leg surgery         plates/screws/hx fx    Current Outpatient Prescriptions   Medication  Sig  Dispense  Refill   .  hydrochlorothiazide 25 MG tablet  Take 25 mg by mouth daily.           Marland Kitchen  metformin (FORTAMET) 500 MG (OSM) 24 hr tablet  Take 500 mg by mouth daily with breakfast.           .  Multiple Vitamin (MULTIVITAMIN) capsule  Take 1 capsule by mouth daily.           .  naproxen sodium (ANAPROX) 220 MG tablet  Take 220 mg by mouth 2 (two) times daily with a meal.           .  ranitidine (ZANTAC) 150 MG tablet  Take 150 mg by mouth 2 (two) times daily.           Marland Kitchen  UNABLE TO FIND  Reishi Mushroom 460 mg bid            .  ALPRAZolam (XANAX) 0.25 MG tablet  Take 1 tablet (0.25 mg total) by mouth 3 (three) times daily as needed for anxiety.   30 tablet   0   .  HYDROcodone-acetaminophen (VICODIN) 5-500 MG per tablet  Take 1 tablet by mouth every 6 (six) hours as needed for pain.   20 tablet   0   .  DISCONTD: oxyCODONE-acetaminophen (PERCOCET) 5-325 MG per tablet  Take 1 tablet by mouth every 4 (four) hours as needed. Stop 2 weeks ago            .  DISCONTD: predniSONE (DELTASONE) 20 MG tablet  Take 20 mg by mouth daily. Stop 2 weeks ago             Allergies as of 01/29/2011 - Review Complete 01/29/2011   Allergen  Reaction  Noted   .  Codeine  Nausea And Vomiting  01/08/2011    Family History:  There is no known family history of colorectal carcinoma , liver disease, or inflammatory bowel disease.      History       Social History   .  Marital Status:  Married       Spouse Name:  N/A       Number of Children:  1   .  Years of Education:  N/A       Occupational History   .  [employer]/bookeeper         part time       Social History Main Topics   .  Smoking status:  Never Smoker    .  Smokeless tobacco:  Not on file   .  Alcohol Use:  Yes         wine 2-3 glasses per weekend   .  Drug Use:  No   .  Sexually Active:  Not on file      Review of Systems: Gen: Denies any fever, chills, sweats, anorexia, fatigue, weakness, malaise, weight loss, and sleep disorder CV: Denies chest pain, angina, palpitations, syncope, orthopnea, PND, peripheral edema, and claudication. Resp: Denies dyspnea at rest, dyspnea with exercise, cough, sputum, wheezing, coughing up blood, and pleurisy. GI: Denies vomiting blood, jaundice, and fecal incontinence.   Denies dysphagia or odynophagia. GU : Denies urinary burning, blood in urine, urinary frequency, urinary hesitancy, nocturnal urination, and urinary incontinence. MS: c/o chronic back pain & degenerative disc disease.   Denies muscle weakness, cramps, atrophy.   Derm: RLE erythema-recent cellulitis 07/2010.  Denies itching, hives, moles, warts, or unhealing ulcers.   Psych: Denies depression.  C/o significant anxiety.  Not on meds "Afraid I have cancer"  Denies memory loss, suicidal ideation, hallucinations, paranoia, and confusion. Heme: Denies bruising, bleeding,  and enlarged lymph nodes. Neuro:  Recent Bell's palsy.  Rash, numbness, tingling around mouth   Physical Exam: BP 152/88  Pulse 98  Temp(Src) 97 F (36.1 C) (Temporal)  Ht 5\' 2"  (1.575 m)  Wt 251 lb 3.2 oz (113.944 kg)  BMI 45.95 kg/m2 General:   Alert,  Well-developed, obese, pleasant and cooperative in NAD Head:  Normocephalic and atraumatic. Eyes:  Sclera clear, no icterus.   Conjunctiva pink. Ears:  Normal auditory acuity. Nose:  No deformity, discharge,  or lesions. Mouth:   No deformity or lesions, dentition normal.  Dry peeling peri-oral skin. Neck:  Supple; no masses or thyromegaly. Lungs:  Clear throughout to auscultation.   No wheezes, crackles, or rhonchi. No acute distress. Heart:  Regular rate and rhythm; no murmurs, clicks, rubs,  or gallops. Abdomen:  Soft, nontender and nondistended. Erythematous, cool to touch 3x1 cm rash intertrigo, slightly raised just below umbilicus.  No exudates.   +hepatomegaly liver palpable 5FB below RCM, TTP.  Unable to palpate splenomegaly.  No masses or hernias noted. Normal bowel sounds, without guarding, and without rebound.  Exam limited given pt's body habitus.    Rectal:  Deferred until time of colonoscopy.    Msk:  Symmetrical without gross deformities. Normal posture. Pulses:  Normal pulses noted. Extremities:  Trace pretibial edema bilat.  Mild erythema bilat pretibial area. Neurologic:  Alert and  oriented x4;  grossly normal neurologically. Skin: see abd/extremities Cervical Nodes:  No significant cervical adenopathy. Psych:  Alert and cooperative. Normal mood and affect.         Shelby Morgan  01/29/2011  3:17 PM  Signed Cc to [facility]  Jonette Eva, MD  02/19/2011  1:30 PM  Signed Pt most likely has HM & elevated liver enzymes 2o to fatty liver disease/MORBID OBESITY. Doubt pt has recurrent melanoma or GB disease with nl HFP. Agree with endoscopy-NEEDS TO BE DONE IN THE OR DUE TO POLYPHARMACY AND PT'S BMI> 45.  RUQ PAIN most likely 2o to functional abd pain. If EGD nl, pt needs to see a [facility] for anxiety.  Lorenza Burton, NP  02/23/2011  9:01 AM  Signed Please be sure procedures are scheduled in OR per [facility]'s note.  Cloria Spring, LPN, LPN  1/61/0960 45:40 AM  Signed Pt was rescheduled to OR by [facility].          RUQ pain Lorenza Burton, NP  01/29/2011 12:53 PM  Addendum Shelby Morgan is a 55 y.o. caucasian female w/ almost 1 yr hx of right upper quadrant pain that radiates to her back & shoulder.   Recent CT shows hepatomegaly & fatty liver.  She also had mildly elevated AST & ALT.  She has typical GERD symptoms as well.  She has hx melanoma 3 yrs ago & recent CT also showed adenopathy abd/pelvis.  She is supposed to have PET for further evaluation, but has postponed due to awaiting insurance coverage.  She has a multitude of GI concerns today.     Etiology of pain is unclear.  Differentials include pain secondary to hepatomegaly, gallbladder disease, upper GI source such as PUD, gastritis or malignancy.  There is concern for recurrent melanoma.  Previous Version  Hypercalcemia Lorenza Burton, NP  01/29/2011 12:54 PM  Signed ? underlying malignancy.  Urged pt to complete PET asap.    Hepatomegaly - Lorenza Burton, NP  01/29/2011  1:06 PM  Addendum In setting of fatty liver, mildly elevated transaminases.  ?  NASH vs. Viral hepatitis (few risk factors) vs. Recurrent melanoma.     Check acute hepatitis panel  Previous Version  Transaminitis Lorenza Burton, NP  01/29/2011 12:56 PM  Signed See hepatomegaly  Anxiety - Lorenza Burton, NP  01/29/2011  1:05 PM  Addendum Pt significantly anxious about her health.  Will give 1 time rx for xanax, however she is instructed to get all further meds & work-up from her PCP.  She agrees that this is not for long-term use and has not been on anxiolytics previously.     Avoid aleve No alcohol Use xanax & vicodin sparingly To ER if severe pain or bleeding  Previous Version  Dysphagia Lorenza Burton, NP  01/29/2011  1:01 PM  Signed She has intermittent solid food dysphagia in setting of chronic GERD & RUQ pain.  Will need EGD w/ possible esophageal dilation to r/o complicated GERD with esophageal web, ring or stricture.   I have discussed risks & benefits which include, but are not limited to, bleeding, infection, perforation & drug reaction.  The patient agrees with this plan & written consent will be obtained.       Rectal bleed Yetta Barre,  Tommy Rainwater, NP  01/29/2011  1:05 PM  Signed Pt gives detailed hx of Ulcerative colitis on 2 previous colonoscopies but denies any maintenance therapy.  Has not had colonoscopy in several yrs.  She is now having large volume bleeding.  Colonoscopy w/ Dr Darrick Penna to r/o colorectal ca, polyp, ulcerative colitis, diverticular bleeding,  NSAID-induced changes, or benign anorectal source.   I have discussed risks & benefits which include, but are not limited to, bleeding, infection, perforation & drug reaction.  The patient agrees with this plan & written consent will be obtained.       GERD (gastroesophageal reflux disease) Lorenza Burton, NP  01/29/2011  1:05 PM  Signed See RUQ pain     [id number] Primary Care Physician:  Colette Ribas, MD Primary Gastroenterologist:  Dr. Darrick Penna  Pre-Procedure History & Physical: HPI:  Shelby Morgan is a 55 y.o. female is here for an EGD.  Past Medical History  Diagnosis Date  . Diabetes mellitus   . Hypertension   . Bell's palsy 01/08/2011  . Hepatomegaly   . Chronic back pain   . Melanoma 2009    Dr Margo Aye, Stage 2, required only surgery  . Anxiety   . Depression     Past Surgical History  Procedure Date  . Cesarean section   . Leg surgery     plates/screws/hx fx-right leg  . Melanoma excision     right knee    Prior to Admission medications   Medication Sig Start Date End Date Taking? Authorizing Provider  calcium carbonate (TUMS - DOSED IN MG ELEMENTAL CALCIUM) 500 MG chewable tablet Chew 1 tablet by mouth daily. As needed    Yes Historical Provider, MD  fish oil-omega-3 fatty acids 1000 MG capsule Take 1 g by mouth daily.     Yes Historical Provider, MD  hydrochlorothiazide 25 MG tablet Take 25 mg by mouth daily.     Yes Historical Provider, MD  metformin (FORTAMET) 500 MG (OSM) 24 hr tablet Take 500 mg by mouth daily with breakfast.     Yes Historical Provider, MD  Multiple Vitamin (MULTIVITAMIN) capsule Take 1 capsule by mouth daily.     Yes  Historical Provider, MD  naproxen sodium (ANAPROX) 220 MG tablet Take 220 mg by mouth 2 (two) times daily  with a meal.     Yes Historical Provider, MD  ALPRAZolam (XANAX) 0.25 MG tablet Take 1 tablet (0.25 mg total) by mouth 3 (three) times daily as needed for anxiety. 01/29/11 01/29/12  Lorenza Burton, NP  HYDROcodone-acetaminophen (VICODIN) 5-500 MG per tablet Take 1 tablet by mouth every 6 (six) hours as needed for pain. 01/29/11 01/29/12  Lorenza Burton, NP  ranitidine (ZANTAC) 150 MG tablet Take 150 mg by mouth 2 (two) times daily as needed. Heartburn    Historical Provider, MD  UNABLE TO FIND Reishi Mushroom 460 mg bid      Historical Provider, MD    Allergies as of 01/29/2011 - Review Complete 01/29/2011  Allergen Reaction Noted  . Codeine Nausea And Vomiting 01/08/2011    Family History  Problem Relation Age of Onset  . Anesthesia problems Neg Hx   . Hypotension Neg Hx   . Malignant hyperthermia Neg Hx   . Pseudochol deficiency Neg Hx     History   Social History  . Marital Status: Married    Spouse Name: N/A    Number of Children: 1  . Years of Education: N/A   Occupational History  . [employer]/bookeeper     part time   Social History Main Topics  . Smoking status: Former Smoker -- 0.5 packs/day for 11 years    Types: Cigarettes    Quit date: 03/03/1993  . Smokeless tobacco: Not on file  . Alcohol Use: Yes     wine 2-3 glasses per weekend  . Drug Use: No  . Sexually Active: Yes    Birth Control/ Protection: Post-menopausal   Other Topics Concern  . Not on file   Social History Narrative  . No narrative on file    Review of Systems: See HPI, otherwise negative ROS  Physical Exam: BP 120/72  Temp(Src) 98 F (36.7 C) (Oral)  Resp 20  SpO2 97% General:   Alert,  pleasant and cooperative in NAD Head:  Normocephalic and atraumatic.  Lungs:  Clear throughout to auscultation.    Heart:  Regular rate and rhythm. Abdomen:  Soft, nontender and nondistended. Normal  bowel sounds, without guarding, and without rebound.     Impression/Plan: Shelby Morgan is here for an EGD/DIL/TCS to be performed for screening for varices and RUQ pain.  Risks, benefits, limitations, imponderables and alternatives regarding EGD/DIL/TCS have been reviewed with the patient. Questions have been answered. All parties agreeable.

## 2011-03-11 ENCOUNTER — Telehealth: Payer: Self-pay | Admitting: Gastroenterology

## 2011-03-11 NOTE — Telephone Encounter (Signed)
Please call pt. Her colon biopsies are normal. 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 TCS in 10 years. High fiber diet.

## 2011-03-11 NOTE — Telephone Encounter (Signed)
Results Cc to PCP  

## 2011-03-12 NOTE — Telephone Encounter (Signed)
Pt informed

## 2011-03-12 NOTE — Telephone Encounter (Signed)
LMOM to call.

## 2011-03-16 ENCOUNTER — Encounter (HOSPITAL_COMMUNITY): Payer: Self-pay | Admitting: Gastroenterology

## 2011-03-16 ENCOUNTER — Telehealth: Payer: Self-pay

## 2011-03-16 NOTE — Telephone Encounter (Signed)
What type of pain? If abd pain, needs OV. If other pain, needs to see PCP. Thanks

## 2011-03-16 NOTE — Telephone Encounter (Signed)
Pt called and said she had endoscopy/colonoscopy last week. Was told she has irritation in her stomach from Aleve. That is the only thing that she has had to take for her pain. Requesting something for pian. Said she was told at the ER that she has swollen liver and lymph nodes. She was only given reflux medication.

## 2011-03-16 NOTE — Telephone Encounter (Signed)
LMOM for pt to call. 

## 2011-03-16 NOTE — Telephone Encounter (Signed)
Pt uses [facility] in [place].

## 2011-03-16 NOTE — Telephone Encounter (Signed)
Patient called she is having pain in side,abd pain, and chest pain. She is coming in for an office appointment Tuesday at 11:30.

## 2011-03-16 NOTE — Telephone Encounter (Signed)
Reminder in epic to have tcs in 10 years 

## 2011-03-17 ENCOUNTER — Ambulatory Visit: Payer: PRIVATE HEALTH INSURANCE | Admitting: Gastroenterology

## 2011-03-25 ENCOUNTER — Other Ambulatory Visit (HOSPITAL_COMMUNITY): Payer: Self-pay | Admitting: Oncology

## 2011-03-25 ENCOUNTER — Encounter (HOSPITAL_COMMUNITY): Payer: PRIVATE HEALTH INSURANCE | Attending: Oncology | Admitting: Oncology

## 2011-03-25 ENCOUNTER — Encounter (HOSPITAL_COMMUNITY): Payer: Self-pay | Admitting: Oncology

## 2011-03-25 DIAGNOSIS — R1011 Right upper quadrant pain: Secondary | ICD-10-CM

## 2011-03-25 DIAGNOSIS — R160 Hepatomegaly, not elsewhere classified: Secondary | ICD-10-CM

## 2011-03-25 DIAGNOSIS — Z139 Encounter for screening, unspecified: Secondary | ICD-10-CM

## 2011-03-25 NOTE — Patient Instructions (Signed)
[facility]  Discharge Instructions  RECOMMENDATIONS MADE BY THE CONSULTANT AND ANY TEST RESULTS WILL BE SENT TO YOUR REFERRING DOCTOR.         SPECIAL INSTRUCTIONS/FOLLOW-UP: We will schedule another CT scan of your abdomen. We will be looking to see if there are any changes to your lymph nodes. It would be beneficial for you to lose some weight. We may not be able to find a cause for your side pain. Get some exercise. Return to clinic as scheduled.   I acknowledge that I have been informed and understand all the instructions given to me and received a copy. I do not have any more questions at this time, but understand that I may call the Specialty Clinic at [facility] at [phone] during business hours should I have any further questions or need assistance in obtaining follow-up care.    __________________________________________  _____________  __________ Signature of Patient or Authorized Representative            Date                   Time    __________________________________________ Nurse's Signature

## 2011-03-25 NOTE — Progress Notes (Signed)
CC:   Shelby Morgan, M.D. Shelby Morgan, M.D. [facility]  DIAGNOSES: 1. Hepatomegaly. 2. Mildly enlarged lymph node seen on CT scan from June, 2012 after a     visit in the ER. 3. History of Bell's palsy. 4. Cellulitis of the right leg on two occasions. She thinks the one     event was November, 2009 and that the second event was January,     2011. She was admitted for the first time for approximately two     weeks, the second time she states she was seen only in the     emergency room and given antibiotics. 5. Marked obesity. 6. History of melanoma, removed by Dr. Suan Halter from the right lower     leg at the knee laterally 3 years ago she states.  This is a pleasant lady who is 55, accompanied by her husband who has been married to her for approximately 26 years. They have a 66 year old son together. She was not pregnant any other times.  She has a history of melanoma which she had resected by Dr. Suan Halter, but we do not have the details on the depth of the melanoma, she thinks it was very early disease according to what he said.  She did not need any further therapy other than resection.  She has been worried about it ever since. She went to the emergency room for right upper quadrant pain in June.  She has had some blood in her stools and was referred to Dr. Jonette Morgan who did EGD and colonoscopy and was said to have esophagitis she states and diverticulosis. She had confused the diverticulosis with diverticulitis. It was recommended that she also start something for gastritis and the gastritis was felt to be due to Naproxen.  She was recommended a high fiber diet. She also mentions that Dr. Darrick Penna wanted her to lose some weight.  Mrs. Hurd is not aware of family history of colon cancer or melanoma. She has 3 brothers, younger, all in good health to the best of her knowledge.  She did have some biopsies of her colon, rectum and stomach which showed the  above-mentioned mild chronic gastritis, negative for H. Pylori and rectal mucosa biopsy showed no evidence for malignancy or inflammatory bowel disease.  LABORATORY DATA:  Her lab work most recently was done on the 8th of August.  She had normal renal function, normal calcium, normal electrolytes, normal CBC. She prior to that in June had an unremarkable CBC, unremarkable differential with a mild neutrophilia and she had an SGPT of only 51 which is minimally elevated. She had a hepatitis panel which was negative.  She had a urinalysis which showed 11 to 20 white cells at that time I believe they treated with some antibiotics.  She has lots of fears, lots of concerns about recurrent melanoma, why her right upper quadrant is hurting intermittently and no one can find a reason for it.  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  Her husband is with her today as I mentioned.  REVIEW OF SYSTEMS:  No fevers, no chills, no night sweats. She has some hot flashes, but no true night sweats. She is not of any lumps anywhere. She has not lost weight in the past six months. She has been over 200 pounds  ever since her baby was born 23 years ago. She was over 180 pounds before she got pregnant.  The rest of the Review of Systems is negative.  PHYSICAL EXAMINATION:  Vital Signs; She has a weight of 249 pounds. Her blood pressure is 133/82.  Her height is 5 feet 2 inches.  Her BMI is calculated to 45.95 kg per meter squared.  Pulse 80 and regular.  Respirations 16 to 18 and unlabored. Skin is warm and dry to touch.  She is in no acute distress, but does complain of pain in the right upper quadrant.  Her exam, she is obese, alert, oriented and in no acute distress. She has freckles on her back, but no lesions. The rest of the skin is unremarkable for any  suspicious lesions. She has no adenopathy in the cervical, supraclavicular or infraclavicular or axillary inguinal areas. Her abdomen is soft and nontender without obvious organomegaly or masses. Only in a certain place in the right upper quadrant is there minimal tenderness to direct palpation.  Bowel sounds are diminished. Breast exam is negative for masses.  Heart shows a regular rhythm and rate without murmur, gallop or rub.  Lungs are clear to auscultation and percussion. She has a trace pitting edema of the right lower extremity, nothing on the left. Pulses 1 to 2+ and symmetrical. There is minimal erythema to pinkness really around the ankle, very low in the leg. It has been there for as long as she can remember since the cellulitis took place. She has no inguinal nodes specifically. She is alert and oriented. Pupils are equally round and reactive to light.  Throat is clear. Tongue is normal in the midline.  I think this lady needs a repeat CT scan first and foremost to see if there is any evidence for changes in the liver or adenopathy. I suspect she has fatty infiltration of her liver from tremendous obesity. The lymph nodes that were mentioned on the CT scan were nonspecific and only minimally enlarged.  The one precaval node was 1.5 cm and a portacaval node was 1.3 cm and none were huge by any means.  I think it is time to repeat her scans as mentioned. I do not think that I need to do any blood work first. I think I would like to compare the CT scan with what we had before in June, rather than going right to a PET scan.  She is in agreement with this and I think that this is the best way to proceed at this time.  We will see her back after the scan and go from there.    ______________________________ Shelby Morgan. Shelby Sleet, MD ESN/MEDQ  D:  03/25/2011  T:  03/25/2011  Job:  [id number]

## 2011-03-25 NOTE — Progress Notes (Signed)
This office note has been dictated.

## 2011-03-26 ENCOUNTER — Ambulatory Visit (HOSPITAL_COMMUNITY)
Admission: RE | Admit: 2011-03-26 | Discharge: 2011-03-26 | Disposition: A | Discharge status: Home / Self Care | Payer: PRIVATE HEALTH INSURANCE | Source: Ambulatory Visit | Attending: Oncology | Admitting: Oncology

## 2011-03-26 DIAGNOSIS — R599 Enlarged lymph nodes, unspecified: Secondary | ICD-10-CM | POA: Insufficient documentation

## 2011-03-26 DIAGNOSIS — Z1231 Encounter for screening mammogram for malignant neoplasm of breast: Secondary | ICD-10-CM | POA: Insufficient documentation

## 2011-03-26 DIAGNOSIS — R1031 Right lower quadrant pain: Secondary | ICD-10-CM | POA: Insufficient documentation

## 2011-03-26 DIAGNOSIS — R160 Hepatomegaly, not elsewhere classified: Secondary | ICD-10-CM

## 2011-03-26 DIAGNOSIS — R1011 Right upper quadrant pain: Secondary | ICD-10-CM

## 2011-03-26 DIAGNOSIS — Z139 Encounter for screening, unspecified: Secondary | ICD-10-CM

## 2011-03-26 MED ORDER — IOHEXOL 300 MG/ML  SOLN
100.0000 mL | Freq: Once | INTRAMUSCULAR | Status: AC | PRN
  Administered 2011-03-26: 100 mL via INTRAVENOUS

## 2011-03-27 ENCOUNTER — Telehealth (HOSPITAL_COMMUNITY): Payer: Self-pay | Admitting: *Deleted

## 2011-03-27 NOTE — Telephone Encounter (Signed)
Message copied by Dennie Maizes on Fri Mar 27, 2011  4:50 PM ------      Message from: Mariel Sleet, ERIC S      Created: Fri Mar 27, 2011  4:02 PM       Call her-CT looks better than previous.

## 2011-03-27 NOTE — Telephone Encounter (Signed)
Message left on pt's answering machine as below. 

## 2011-04-02 NOTE — Progress Notes (Signed)
noted 

## 2011-04-20 ENCOUNTER — Encounter (HOSPITAL_COMMUNITY): Payer: Self-pay | Admitting: Oncology

## 2011-04-20 ENCOUNTER — Encounter (HOSPITAL_COMMUNITY): Payer: PRIVATE HEALTH INSURANCE | Attending: Oncology | Admitting: Oncology

## 2011-04-20 VITALS — BP 128/79 | HR 69 | Temp 98.0°F | Ht 62.0 in | Wt 248.4 lb

## 2011-04-20 DIAGNOSIS — Z85820 Personal history of malignant melanoma of skin: Secondary | ICD-10-CM

## 2011-04-20 DIAGNOSIS — R160 Hepatomegaly, not elsewhere classified: Secondary | ICD-10-CM

## 2011-04-20 NOTE — Patient Instructions (Signed)
[facility]  Discharge Instructions  RECOMMENDATIONS MADE BY THE CONSULTANT AND ANY TEST RESULTS WILL BE SENT TO YOUR REFERRING DOCTOR.    Released from clinic. Continue to follow up with your primary medical doctor.      I acknowledge that I have been informed and understand all the instructions given to me and received a copy. I do not have any more questions at this time, but understand that I may call the Specialty Clinic at [facility] at [phone] during business hours should I have any further questions or need assistance in obtaining follow-up care.    __________________________________________  _____________  __________ Signature of Patient or Authorized Representative            Date                   Time    __________________________________________ Nurse's Signature

## 2011-04-20 NOTE — Progress Notes (Signed)
This office note has been dictated.

## 2011-04-29 ENCOUNTER — Ambulatory Visit (HOSPITAL_COMMUNITY): Payer: PRIVATE HEALTH INSURANCE | Admitting: Oncology

## 2011-05-14 ENCOUNTER — Ambulatory Visit (INDEPENDENT_AMBULATORY_CARE_PROVIDER_SITE_OTHER): Payer: PRIVATE HEALTH INSURANCE | Admitting: Urgent Care

## 2011-05-14 ENCOUNTER — Encounter: Payer: Self-pay | Admitting: Urgent Care

## 2011-05-14 DIAGNOSIS — K7689 Other specified diseases of liver: Secondary | ICD-10-CM

## 2011-05-14 DIAGNOSIS — E669 Obesity, unspecified: Secondary | ICD-10-CM | POA: Insufficient documentation

## 2011-05-14 DIAGNOSIS — K219 Gastro-esophageal reflux disease without esophagitis: Secondary | ICD-10-CM

## 2011-05-14 DIAGNOSIS — K760 Fatty (change of) liver, not elsewhere classified: Secondary | ICD-10-CM | POA: Insufficient documentation

## 2011-05-14 DIAGNOSIS — K649 Unspecified hemorrhoids: Secondary | ICD-10-CM | POA: Insufficient documentation

## 2011-05-14 DIAGNOSIS — R1011 Right upper quadrant pain: Secondary | ICD-10-CM

## 2011-05-14 DIAGNOSIS — R1310 Dysphagia, unspecified: Secondary | ICD-10-CM

## 2011-05-14 MED ORDER — HYDROCORTISONE ACETATE 25 MG RE SUPP: 25.0000 mg | Freq: Two times a day (BID) | RECTAL | Status: DC

## 2011-05-14 MED ORDER — DOCUSATE SODIUM 100 MG PO CAPS: 100.0000 mg | ORAL_CAPSULE | Freq: Two times a day (BID) | ORAL | Status: AC | PRN

## 2011-05-14 MED ORDER — PANTOPRAZOLE SODIUM 40 MG PO TBEC: 40.0000 mg | DELAYED_RELEASE_TABLET | Freq: Every day | ORAL | Status: DC

## 2011-05-14 NOTE — Progress Notes (Signed)
Referring Provider: Colette Ribas, MD Primary Care Physician:  Colette Ribas, MD Primary Gastroenterologist:  Dr. Darrick Penna  Chief Complaint  Patient presents with  . Medication Refill    HPI:  Shelby Morgan is a 55 y.o. female here for follow up for NAFLD, NSAID-induced gastritis, and rectal bleeding.  She was last seen by Dr. Jettie Booze 03/09/11 and underwent colonoscopy and EGD. She was placed on protonic 40 mg daily, however she misinterpreted the prescription and so she was supposed to take it before each meal so she had actually been taking it 3 times a day. Some have esophageal stricture which was dilated with savory dilator. She has done well her. Her dysphagia is resolved. Her upper abdominal symptoms including upper and indigestion have resolved. Hx bleeding hemorrhoids.  She never picked up the prescription for hemorrhoid treatment at the pharmacy. She has been using treadmill.  Trying to eat healthy foods, more veggies, more whole wheat.   8/30 CT ABD/Pelvis w/ IV contrast->no acute intra-abdominal or pelvic pathology. Specifically, no abnormality is seen to explain the history of right-sided abdominal pain, for which the patient was previously scanned 01/08/2011, also with no demonstrable etiology. Previously seen pericaval and  aortocaval nodes are decreased in size, without new lymphadenopathy or other evidence for metastatic melanoma. She has been followed by Dr. Mariel Sleet given her history of melanoma and hepatomegaly. Lab Summary Latest Ref Rng 03/04/2011 01/08/2011 01/08/2011 01/08/2011  Hemoglobin 12.0 - 15.0 g/dL 16.1 09.6  04.5  Hematocrit 36.0 - 46.0 % 39.1 40.5  39.1  White count 4.0 - 10.5 K/uL 5.8 9.5  7.7  Platelet count 150 - 400 K/uL 317 376  329  Sodium 135 - 145 mEq/L 139 136  138  Potassium 3.5 - 5.1 mEq/L 4.1 4.0  3.5  Calcium 8.4 - 10.5 mg/dL 40.9 81.1  91.4 (H)  Creatinine 0.50 - 1.10 mg/dL 7.82 9.56  2.13  AST 0 - 37 U/L  31 43 (H)   Alk Phos 39 - 117 U/L  56  56   Bilirubin 0.3 - 1.2 mg/dL  0.3 0.3   Glucose 70 - 99 mg/dL 086 (H) 578 (H)  469 (H)  Total protein 6.0 - 8.3 g/dL  8.0 7.7   Albumin 3.5 - 5.2 g/dL  3.7 3.5    Past Medical History  Diagnosis Date  . Diabetes mellitus   . Hypertension   . Bell's palsy 01/08/2011  . Hepatomegaly   . Chronic back pain   . Melanoma 2009    Dr Margo Aye, Stage 2, required only surgery  . Anxiety   . Depression   . NAFLD (nonalcoholic fatty liver disease)    Past Surgical History  Procedure Date  . Cesarean section   . Leg surgery     plates/screws/hx fx-right leg  . Melanoma excision     right knee  . Colonoscopy 03/09/2011    Dr Darrick Penna diverticulosis, internal and external hemorrhoids  . Esophagogastroduodenoscopy 03/09/2011    Esophageal stricture dilated to 16 MM savory, onset induced gastritis and duodenitis  . Savory dilation 03/09/2011    Current Outpatient Prescriptions  Medication Sig Dispense Refill  . CALCIUM & MAGNESIUM CARBONATES PO Take 333 mg by mouth 2 (two) times daily. Calcium 333mg , mag 133mg , zinc 5mg  (combo pill)       . calcium carbonate (TUMS - DOSED IN MG ELEMENTAL CALCIUM) 500 MG chewable tablet Chew 1 tablet by mouth daily. As needed       . Echinacea  500 MG CAPS Take 760 mg by mouth 2 (two) times daily.        Marland Kitchen escitalopram (LEXAPRO) 10 MG tablet Take 10 mg by mouth daily.        . fish oil-omega-3 fatty acids 1000 MG capsule Take 1 g by mouth daily.        Marland Kitchen glucosamine-chondroitin 500-400 MG tablet Take 1 tablet by mouth 2 (two) times daily.        . hydrochlorothiazide 25 MG tablet Take 25 mg by mouth daily.        . metformin (FORTAMET) 500 MG (OSM) 24 hr tablet Take 500 mg by mouth daily with breakfast.        . Multiple Vitamin (MULTIVITAMIN) capsule Take 1 capsule by mouth daily.        . naproxen sodium (ANAPROX) 220 MG tablet Take 220 mg by mouth 2 (two) times daily with a meal.        . vitamin B-12 (CYANOCOBALAMIN) 500 MCG tablet Take 500 mcg by mouth  daily.        Marland Kitchen docusate sodium (COLACE) 100 MG capsule Take 1 capsule (100 mg total) by mouth 2 (two) times daily as needed for constipation.  62 capsule  5  . hydrocortisone (ANUSOL-HC) 25 MG suppository Place 1 suppository (25 mg total) rectally every 12 (twelve) hours.  20 suppository  0  . pantoprazole (PROTONIX) 40 MG tablet Take 1 tablet (40 mg total) by mouth daily.  30 tablet  11    Allergies as of 05/14/2011 - Review Complete 05/14/2011  Allergen Reaction Noted  . Codeine Nausea And Vomiting 01/08/2011    Review of Systems: Gen: Denies any fever, chills, sweats, anorexia, fatigue, weakness, malaise, weight loss, and sleep disorder CV: Denies chest pain, angina, palpitations, syncope, orthopnea, PND, peripheral edema, and claudication. Resp: Denies dyspnea at rest, dyspnea with exercise, cough, sputum, wheezing, coughing up blood, and pleurisy. GI: Denies vomiting blood, jaundice, and fecal incontinence.   Denies dysphagia or odynophagia. Derm: Denies rash, itching, dry skin, hives, moles, warts, or unhealing ulcers.  Psych: Denies depression, anxiety, memory loss, suicidal ideation, hallucinations, paranoia, and confusion. Heme: Denies bruising, bleeding, and enlarged lymph nodes.  Physical Exam: BP 128/80  Pulse 67  Temp(Src) 97.6 F (36.4 C) (Temporal)  Ht 5\' 2"  (1.575 m)  Wt 252 lb 9.6 oz (114.579 kg)  BMI 46.20 kg/m2 General:   Alert,  Well-developed, obese, pleasant and cooperative in NAD Head:  Normocephalic and atraumatic. Eyes:  Sclera clear, no icterus.   Conjunctiva pink. Mouth:  No deformity or lesions, oropharynx pink and moist. Neck:  Supple; no masses or thyromegaly. Heart:  Regular rate and rhythm; no murmurs, clicks, rubs,  or gallops. Abdomen:  Soft, obese, nontender and nondistended. No masses, hepatosplenomegaly or hernias noted. Normal bowel sounds, without guarding, and without rebound.   Msk:  Symmetrical without gross deformities. Normal  posture. Pulses:  Normal pulses noted. Extremities:  Without clubbing or edema. Neurologic:  Alert and  oriented x4;  grossly normal neurologically. Skin:  Intact without significant lesions or rashes. Cervical Nodes:  No significant cervical adenopathy. Psych:  Alert and cooperative. Normal mood and affect.

## 2011-05-14 NOTE — Assessment & Plan Note (Signed)
Anusol-HC suppositories per rectum twice a day for 10 days.

## 2011-05-14 NOTE — Assessment & Plan Note (Signed)
Well controlled on Protonix. Patient was inadvertently taking 3 pills per day instead of once daily. She will resume once daily protonic and call if she has any breakthrough symptoms. Encouraged weight loss.

## 2011-05-14 NOTE — Assessment & Plan Note (Signed)
Secondary to esophageal stricture. Resolved.

## 2011-05-14 NOTE — Assessment & Plan Note (Addendum)
Suspected hepatomegaly and mild transaminitis related to nonalcoholic fatty liver disease.  She was commended on her weight loss efforts and healthy diet. Diabetes mellitus has been controlled with diet and she tells me she is doing well. Instructions for fatty liver: Recommend 1-2# weight loss per week until ideal body weight through exercise & diet. Low fat/cholesterol diet. Gradually increase exercise from 15 min daily up to 1 hr per day 5 days/week. Limit alcohol use. Office visit in 6 months or sooner if needed. Recheck LFTs and hemoglobin A1c.

## 2011-05-14 NOTE — Assessment & Plan Note (Signed)
Gradual weight loss 

## 2011-05-14 NOTE — Progress Notes (Signed)
Cc to PCP 

## 2011-05-14 NOTE — Assessment & Plan Note (Signed)
Resolved

## 2011-05-14 NOTE — Patient Instructions (Addendum)
Anusol suppositories twice per day Instructions for fatty liver: Recommend 1-2# weight loss per week until ideal body weight through exercise & diet. Low fat/cholesterol diet. Gradually increase exercise from 15 min daily up to 1 hr per day 5 days/week. Limit alcohol use. Limit use of anti-inflammatories Change protonix to 40mg  daily   Fatty Liver Fatty liver is the accumulation of fat in liver cells. It is also called hepatosteatosis or steatohepatitis. It is normal for your liver to contain some fat. If fat is more than 5 to 10% of your liver's weight, you have fatty liver.  There are often no symptoms (problems) for years while damage is still occurring. People often learn about their fatty liver when they have medical tests for other reasons. Fat can damage your liver for years or even decades without causing problems. When it becomes severe, it can cause fatigue, weight loss, weakness, and confusion. This makes you more likely to develop more serious liver problems. The liver is the largest organ in the body. It does a lot of work and often gives no warning signs when it is sick until late in a disease. The liver has many important jobs including:  Breaking down foods.   Storing vitamins, iron, and other minerals.   Making proteins.   Making bile for food digestion.   Breaking down many products including medications, alcohol and some poisons.  CAUSES  There are a number of different conditions, medications, and poisons that can cause a fatty liver. Eating too many calories causes fat to build up in the liver. Not processing and breaking fats down normally may also cause this. Certain conditions, such as obesity, diabetes, and high triglycerides also cause this. Most fatty liver patients tend to be middle-aged and over weight.  Some causes of fatty liver are:  Alcohol over consumption.   Malnutrition.   Steroid use.   Valproic acid toxicity.   Obesity.   Cushing's  syndrome.   Poisons.   Tetracycline in high dosages.   Pregnancy.   Diabetes.   Hyperlipidemia.   Rapid weight loss.  Some people develop fatty liver even having none of these conditions. SYMPTOMS  Fatty liver most often causes no problems. This is called asymptomatic.  It can be diagnosed with blood tests and also by a liver biopsy.   It is one of the most common causes of minor elevations of liver enzymes on routine blood tests.   Specialized Imaging of the liver using ultrasound, CT (computed tomography) scan, or MRI (magnetic resonance imaging) can suggest a fatty liver but a biopsy is needed to confirm it.   A biopsy involves taking a small sample of liver tissue. This is done by using a needle. It is then looked at under a microscope by a specialist.  TREATMENT  It is important to treat the cause. Simple fatty liver without a medical reason may not need treatment.  Weight loss, fat restriction, and exercise in overweight patients produces inconsistent results but is worth trying.   Fatty liver due to alcohol toxicity may not improve even with stopping drinking.   Good control of diabetes may reduce fatty liver.   Lower your triglycerides through diet, medication or both.   Eat a balanced, healthy diet.   Increase your physical activity.   Get regular checkups from a liver specialist.   There are no medical or surgical treatments for a fatty liver or NASH, but improving your diet and increasing your exercise may help prevent or reverse  some of the damage.  PROGNOSIS  Fatty liver may cause no damage or it can lead to an inflammation of the liver. This is, called steatohepatitis. When it is linked to alcohol abuse, it is called alcoholic steatohepatitis. It often is not linked to alcohol. It is then called nonalcoholic steatohepatitis, or NASH. Over time the liver may become scarred and hardened. This condition is called cirrhosis. Cirrhosis is serious and may lead to  liver failure or cancer. NASH is one of the leading causes of cirrhosis. About 10-20% of Americans have fatty liver and a smaller 2-5% has NASH. Document Released: 08/28/2005 Document Revised: 03/25/2011 Document Reviewed: 10/21/2005 [facility] Patient Information 2012 [place], [place].

## 2011-05-16 LAB — HEPATIC FUNCTION PANEL
AST: 30 U/L (ref 0–37)
Alkaline Phosphatase: 53 U/L (ref 39–117)
Bilirubin, Direct: 0.1 mg/dL (ref 0.0–0.3)
Indirect Bilirubin: 0.4 mg/dL (ref 0.0–0.9)
Total Bilirubin: 0.5 mg/dL (ref 0.3–1.2)

## 2011-05-16 LAB — HEMOGLOBIN A1C: Hgb A1c MFr Bld: 6.6 % — ABNORMAL HIGH (ref <5.7)

## 2011-05-18 NOTE — Progress Notes (Signed)
Quick Note:  Please call patient. Liver tests are normal. Her hemoglobin A1c is 6.6. This is still too high. She needs to followup with Colette Ribas, MD Cc: Colette Ribas, MD  ______

## 2011-05-18 NOTE — Progress Notes (Signed)
Quick Note:  Pt returned call and was informed. ______ 

## 2011-05-19 NOTE — Progress Notes (Signed)
Results Cc to PCP  

## 2011-06-11 ENCOUNTER — Encounter: Payer: Self-pay | Admitting: Gastroenterology

## 2012-05-01 ENCOUNTER — Encounter (HOSPITAL_COMMUNITY): Payer: Self-pay

## 2012-05-01 ENCOUNTER — Inpatient Hospital Stay (HOSPITAL_COMMUNITY)
Admission: EM | Admit: 2012-05-01 | Discharge: 2012-05-04 | DRG: 603 | Disposition: A | Discharge status: Home / Self Care | Payer: MEDICAID | Attending: Internal Medicine | Admitting: Internal Medicine

## 2012-05-01 DIAGNOSIS — F419 Anxiety disorder, unspecified: Secondary | ICD-10-CM

## 2012-05-01 DIAGNOSIS — Z23 Encounter for immunization: Secondary | ICD-10-CM

## 2012-05-01 DIAGNOSIS — Z87891 Personal history of nicotine dependence: Secondary | ICD-10-CM

## 2012-05-01 DIAGNOSIS — R7989 Other specified abnormal findings of blood chemistry: Secondary | ICD-10-CM | POA: Diagnosis present

## 2012-05-01 DIAGNOSIS — A419 Sepsis, unspecified organism: Secondary | ICD-10-CM

## 2012-05-01 DIAGNOSIS — F329 Major depressive disorder, single episode, unspecified: Secondary | ICD-10-CM | POA: Diagnosis present

## 2012-05-01 DIAGNOSIS — F411 Generalized anxiety disorder: Secondary | ICD-10-CM | POA: Diagnosis present

## 2012-05-01 DIAGNOSIS — R739 Hyperglycemia, unspecified: Secondary | ICD-10-CM

## 2012-05-01 DIAGNOSIS — Z79899 Other long term (current) drug therapy: Secondary | ICD-10-CM

## 2012-05-01 DIAGNOSIS — L02419 Cutaneous abscess of limb, unspecified: Principal | ICD-10-CM | POA: Diagnosis present

## 2012-05-01 DIAGNOSIS — E871 Hypo-osmolality and hyponatremia: Secondary | ICD-10-CM

## 2012-05-01 DIAGNOSIS — F3289 Other specified depressive episodes: Secondary | ICD-10-CM | POA: Diagnosis present

## 2012-05-01 DIAGNOSIS — R51 Headache: Secondary | ICD-10-CM | POA: Diagnosis present

## 2012-05-01 DIAGNOSIS — Z85820 Personal history of malignant melanoma of skin: Secondary | ICD-10-CM

## 2012-05-01 DIAGNOSIS — L03119 Cellulitis of unspecified part of limb: Principal | ICD-10-CM

## 2012-05-01 DIAGNOSIS — E669 Obesity, unspecified: Secondary | ICD-10-CM

## 2012-05-01 DIAGNOSIS — E872 Acidosis, unspecified: Secondary | ICD-10-CM

## 2012-05-01 DIAGNOSIS — I10 Essential (primary) hypertension: Secondary | ICD-10-CM | POA: Diagnosis present

## 2012-05-01 DIAGNOSIS — E876 Hypokalemia: Secondary | ICD-10-CM

## 2012-05-01 DIAGNOSIS — L03115 Cellulitis of right lower limb: Secondary | ICD-10-CM | POA: Diagnosis present

## 2012-05-01 DIAGNOSIS — K760 Fatty (change of) liver, not elsewhere classified: Secondary | ICD-10-CM

## 2012-05-01 DIAGNOSIS — Z6841 Body Mass Index (BMI) 40.0 and over, adult: Secondary | ICD-10-CM

## 2012-05-01 DIAGNOSIS — E119 Type 2 diabetes mellitus without complications: Secondary | ICD-10-CM

## 2012-05-01 DIAGNOSIS — D649 Anemia, unspecified: Secondary | ICD-10-CM | POA: Diagnosis present

## 2012-05-01 DIAGNOSIS — K219 Gastro-esophageal reflux disease without esophagitis: Secondary | ICD-10-CM

## 2012-05-01 DIAGNOSIS — E1169 Type 2 diabetes mellitus with other specified complication: Secondary | ICD-10-CM | POA: Diagnosis present

## 2012-05-01 DIAGNOSIS — K7689 Other specified diseases of liver: Secondary | ICD-10-CM

## 2012-05-01 LAB — HEPATIC FUNCTION PANEL
AST: 39 U/L — ABNORMAL HIGH (ref 0–37)
Albumin: 3.4 g/dL — ABNORMAL LOW (ref 3.5–5.2)
Alkaline Phosphatase: 54 U/L (ref 39–117)
Total Bilirubin: 0.4 mg/dL (ref 0.3–1.2)

## 2012-05-01 LAB — CBC WITH DIFFERENTIAL/PLATELET
Eosinophils Absolute: 0 10*3/uL (ref 0.0–0.7)
HCT: 39.9 % (ref 36.0–46.0)
Hemoglobin: 13.5 g/dL (ref 12.0–15.0)
Lymphs Abs: 1.1 10*3/uL (ref 0.7–4.0)
MCH: 28.7 pg (ref 26.0–34.0)
Monocytes Absolute: 0.5 10*3/uL (ref 0.1–1.0)
Monocytes Relative: 3 % (ref 3–12)
Neutrophils Relative %: 91 % — ABNORMAL HIGH (ref 43–77)
RBC: 4.7 MIL/uL (ref 3.87–5.11)

## 2012-05-01 LAB — HEMOGLOBIN A1C: Hgb A1c MFr Bld: 7 % — ABNORMAL HIGH (ref <5.7)

## 2012-05-01 LAB — PROTIME-INR: INR: 1.09 (ref 0.00–1.49)

## 2012-05-01 LAB — GLUCOSE, CAPILLARY
Glucose-Capillary: 196 mg/dL — ABNORMAL HIGH (ref 70–99)
Glucose-Capillary: 192 mg/dL — ABNORMAL HIGH (ref 70–99)
Glucose-Capillary: 157 mg/dL — ABNORMAL HIGH (ref 70–99)
Glucose-Capillary: 119 mg/dL — ABNORMAL HIGH (ref 70–99)

## 2012-05-01 LAB — BASIC METABOLIC PANEL
CO2: 25 mEq/L (ref 19–32)
Calcium: 9.6 mg/dL (ref 8.4–10.5)
Glucose, Bld: 241 mg/dL — ABNORMAL HIGH (ref 70–99)
Potassium: 3.1 mEq/L — ABNORMAL LOW (ref 3.5–5.1)
Sodium: 132 mEq/L — ABNORMAL LOW (ref 135–145)

## 2012-05-01 LAB — TSH: TSH: 1.011 u[IU]/mL (ref 0.350–4.500)

## 2012-05-01 LAB — MAGNESIUM: Magnesium: 1.5 mg/dL (ref 1.5–2.5)

## 2012-05-01 MED ORDER — TRAZODONE HCL 50 MG PO TABS: 25.0000 mg | ORAL_TABLET | Freq: Every evening | ORAL | Status: DC | PRN

## 2012-05-01 MED ORDER — HYDROMORPHONE HCL PF 1 MG/ML IJ SOLN
0.5000 mg | INTRAMUSCULAR | Status: DC | PRN
  Administered 2012-05-01 – 2012-05-02 (×4): 0.5 mg via INTRAVENOUS
  Filled 2012-05-01 (×4): qty 1

## 2012-05-01 MED ORDER — BISACODYL 5 MG PO TBEC: 5.0000 mg | DELAYED_RELEASE_TABLET | Freq: Every day | ORAL | Status: DC | PRN

## 2012-05-01 MED ORDER — ONDANSETRON HCL 4 MG/2ML IJ SOLN: 4.0000 mg | Freq: Four times a day (QID) | INTRAMUSCULAR | Status: DC | PRN

## 2012-05-01 MED ORDER — ACETAMINOPHEN 650 MG RE SUPP: 650.0000 mg | Freq: Four times a day (QID) | RECTAL | Status: DC | PRN

## 2012-05-01 MED ORDER — SODIUM CHLORIDE 0.9 % IV BOLUS (SEPSIS)
1000.0000 mL | Freq: Once | INTRAVENOUS | Status: AC
  Administered 2012-05-01: 1000 mL via INTRAVENOUS

## 2012-05-01 MED ORDER — ENOXAPARIN SODIUM 40 MG/0.4ML ~~LOC~~ SOLN
40.0000 mg | SUBCUTANEOUS | Status: DC
  Administered 2012-05-01 – 2012-05-04 (×4): 40 mg via SUBCUTANEOUS
  Filled 2012-05-01 (×5): qty 0.4

## 2012-05-01 MED ORDER — MAGNESIUM SULFATE 50 % IJ SOLN: 2.0000 g | Freq: Once | INTRAVENOUS | Status: DC

## 2012-05-01 MED ORDER — POTASSIUM CHLORIDE IN NACL 20-0.9 MEQ/L-% IV SOLN
INTRAVENOUS | Status: DC
  Administered 2012-05-01 – 2012-05-02 (×2): via INTRAVENOUS
  Administered 2012-05-03: 20 mL/h via INTRAVENOUS

## 2012-05-01 MED ORDER — INSULIN ASPART 100 UNIT/ML ~~LOC~~ SOLN
0.0000 [IU] | Freq: Three times a day (TID) | SUBCUTANEOUS | Status: DC
  Administered 2012-05-01 (×3): 2 [IU] via SUBCUTANEOUS
  Administered 2012-05-02 (×2): 1 [IU] via SUBCUTANEOUS
  Administered 2012-05-02: 2 [IU] via SUBCUTANEOUS
  Administered 2012-05-03: 1 [IU] via SUBCUTANEOUS
  Administered 2012-05-03 – 2012-05-04 (×2): 2 [IU] via SUBCUTANEOUS
  Administered 2012-05-04: 1 [IU] via SUBCUTANEOUS

## 2012-05-01 MED ORDER — OMEGA-3-ACID ETHYL ESTERS 1 G PO CAPS
1.0000 | ORAL_CAPSULE | Freq: Every day | ORAL | Status: DC
  Administered 2012-05-01 – 2012-05-04 (×4): 1 g via ORAL
  Filled 2012-05-01 (×4): qty 1

## 2012-05-01 MED ORDER — ACETAMINOPHEN 500 MG PO TABS: 1000.0000 mg | ORAL_TABLET | Freq: Once | ORAL | Status: DC

## 2012-05-01 MED ORDER — METFORMIN HCL ER 500 MG PO TB24
500.0000 mg | ORAL_TABLET | Freq: Two times a day (BID) | ORAL | Status: DC
  Administered 2012-05-01 – 2012-05-04 (×7): 500 mg via ORAL
  Filled 2012-05-01 (×11): qty 1

## 2012-05-01 MED ORDER — PANTOPRAZOLE SODIUM 40 MG PO TBEC
40.0000 mg | DELAYED_RELEASE_TABLET | Freq: Every day | ORAL | Status: DC
  Administered 2012-05-01 – 2012-05-02 (×2): 40 mg via ORAL
  Filled 2012-05-01 (×2): qty 1

## 2012-05-01 MED ORDER — FLEET ENEMA 7-19 GM/118ML RE ENEM: 1.0000 | ENEMA | Freq: Once | RECTAL | Status: AC | PRN

## 2012-05-01 MED ORDER — ONDANSETRON HCL 4 MG PO TABS: 4.0000 mg | ORAL_TABLET | Freq: Four times a day (QID) | ORAL | Status: DC | PRN

## 2012-05-01 MED ORDER — ACETAMINOPHEN 500 MG PO TABS
1000.0000 mg | ORAL_TABLET | Freq: Once | ORAL | Status: AC
  Administered 2012-05-01: 1000 mg via ORAL
  Filled 2012-05-01: qty 2

## 2012-05-01 MED ORDER — POTASSIUM CHLORIDE CRYS ER 20 MEQ PO TBCR
40.0000 meq | EXTENDED_RELEASE_TABLET | Freq: Two times a day (BID) | ORAL | Status: AC
  Administered 2012-05-01 – 2012-05-02 (×2): 40 meq via ORAL
  Filled 2012-05-01 (×2): qty 2

## 2012-05-01 MED ORDER — VANCOMYCIN HCL IN DEXTROSE 1-5 GM/200ML-% IV SOLN
1000.0000 mg | Freq: Once | INTRAVENOUS | Status: AC
  Administered 2012-05-01: 1000 mg via INTRAVENOUS
  Filled 2012-05-01: qty 200

## 2012-05-01 MED ORDER — KETOROLAC TROMETHAMINE 30 MG/ML IJ SOLN
30.0000 mg | Freq: Once | INTRAMUSCULAR | Status: AC
  Administered 2012-05-01: 30 mg via INTRAVENOUS
  Filled 2012-05-01: qty 1

## 2012-05-01 MED ORDER — DOXYCYCLINE HYCLATE 100 MG IV SOLR
INTRAVENOUS | Status: AC
  Filled 2012-05-01: qty 100

## 2012-05-01 MED ORDER — DOXYCYCLINE HYCLATE 100 MG IV SOLR
100.0000 mg | Freq: Two times a day (BID) | INTRAVENOUS | Status: DC
  Administered 2012-05-01 – 2012-05-03 (×5): 100 mg via INTRAVENOUS
  Filled 2012-05-01 (×5): qty 100

## 2012-05-01 MED ORDER — ACETAMINOPHEN 500 MG PO TABS
500.0000 mg | ORAL_TABLET | Freq: Four times a day (QID) | ORAL | Status: DC | PRN
  Administered 2012-05-01 – 2012-05-03 (×3): 500 mg via ORAL
  Filled 2012-05-01 (×4): qty 1

## 2012-05-01 MED ORDER — HYDROMORPHONE HCL PF 1 MG/ML IJ SOLN
0.5000 mg | Freq: Once | INTRAMUSCULAR | Status: AC
  Administered 2012-05-01: 0.5 mg via INTRAVENOUS
  Filled 2012-05-01: qty 1

## 2012-05-01 MED ORDER — MAGNESIUM SULFATE 40 MG/ML IJ SOLN
2.0000 g | Freq: Once | INTRAMUSCULAR | Status: AC
  Administered 2012-05-01: 2 g via INTRAVENOUS
  Filled 2012-05-01: qty 50

## 2012-05-01 MED ORDER — SODIUM CHLORIDE 0.9 % IV SOLN
INTRAVENOUS | Status: DC
  Administered 2012-05-01: 04:00:00 via INTRAVENOUS

## 2012-05-01 MED ORDER — INSULIN ASPART 100 UNIT/ML ~~LOC~~ SOLN: 0.0000 [IU] | Freq: Every day | SUBCUTANEOUS | Status: DC

## 2012-05-01 NOTE — ED Notes (Signed)
Redness and pain to right lower leg, states she gets cellulitis in same leg at this time every year, states fevers at home today

## 2012-05-01 NOTE — ED Provider Notes (Signed)
History     CSN: [id number]  Arrival date & time 05/01/12  Rich Fuchs   First MD Initiated Contact with Patient 05/01/12 [phone]      Chief Complaint  Patient presents with  . Recurrent Skin Infections    (Consider location/radiation/quality/duration/timing/severity/associated sxs/prior treatment) HPI Comments: 56 year old female with a history of hypertension, diabetes and recurrent skin infections who presents with a complaint of right lower extremity pain swelling and redness. She states that she first noticed this earlier in the day yesterday October 5, it has gradually gotten worse, nothing makes it better or worse, associated with fevers as high as 103 at home. She does get recurrent skin infections similar to this and has been hospitalized in the past because of severe cellulitis. She denies coughing, shortness of breath, back pain, dysuria or diarrhea, headache, blurred vision, nausea or vomiting.  The history is provided by the patient and the spouse.    Past Medical History  Diagnosis Date  . Diabetes mellitus   . Hypertension   . Bell's palsy 01/08/2011  . Hepatomegaly   . Chronic back pain   . Melanoma 2009    Dr Margo Aye, Stage 2, required only surgery  . Anxiety   . Depression   . NAFLD (nonalcoholic fatty liver disease)     Past Surgical History  Procedure Date  . Cesarean section   . Leg surgery     plates/screws/hx fx-right leg  . Melanoma excision     right knee  . Colonoscopy 03/09/2011    Dr Darrick Penna diverticulosis, internal and external hemorrhoids  . Esophagogastroduodenoscopy 03/09/2011    Esophageal stricture dilated to 16 MM savory, NSAID- induced gastritis and duodenitis  . Savory dilation 03/09/2011    Family History  Problem Relation Age of Onset  . Anesthesia problems Neg Hx   . Hypotension Neg Hx   . Malignant hyperthermia Neg Hx   . Pseudochol deficiency Neg Hx     History  Substance Use Topics  . Smoking status: Former Smoker -- 0.5 packs/day for  11 years    Types: Cigarettes    Quit date: 03/03/1993  . Smokeless tobacco: Not on file  . Alcohol Use: No     wine 2-3 glasses per weekend    OB History    Grav Para Term Preterm Abortions TAB SAB Ect Mult Living                  Review of Systems  All other systems reviewed and are negative.    Allergies  Codeine  Home Medications   Current Outpatient Rx  Name Route Sig Dispense Refill  . OMEGA-3 FATTY ACIDS 1000 MG PO CAPS Oral Take 1 g by mouth daily.      Marland Kitchen HYDROCHLOROTHIAZIDE 25 MG PO TABS Oral Take 25 mg by mouth daily.      Marland Kitchen METFORMIN HCL ER (OSM) 500 MG PO TB24 Oral Take 500 mg by mouth daily with breakfast.      . MULTIVITAMINS PO CAPS Oral Take 1 capsule by mouth daily.      Marland Kitchen NAPROXEN SODIUM 220 MG PO TABS Oral Take 220 mg by mouth 2 (two) times daily with a meal.      . PANTOPRAZOLE SODIUM 40 MG PO TBEC Oral Take 1 tablet (40 mg total) by mouth daily. 30 tablet 11  . CALCIUM & MAGNESIUM CARBONATES PO Oral Take 333 mg by mouth 2 (two) times daily. Calcium 333mg , mag 133mg , zinc 5mg  (combo pill)     .  CALCIUM CARBONATE ANTACID 500 MG PO CHEW Oral Chew 1 tablet by mouth daily. As needed     . ECHINACEA 500 MG PO CAPS Oral Take 760 mg by mouth 2 (two) times daily.      Marland Kitchen ESCITALOPRAM OXALATE 10 MG PO TABS Oral Take 10 mg by mouth daily.      Marland Kitchen GLUCOSAMINE-CHONDROITIN 500-400 MG PO TABS Oral Take 1 tablet by mouth 2 (two) times daily.      Marland Kitchen HYDROCORTISONE ACETATE 25 MG RE SUPP Rectal Place 1 suppository (25 mg total) rectally every 12 (twelve) hours. 20 suppository 0  . VITAMIN B-12 500 MCG PO TABS Oral Take 500 mcg by mouth daily.        BP 111/62  Pulse 88  Temp 99.1 F (37.3 C) (Oral)  Resp 18  Ht 5\' 2"  (1.575 m)  Wt 260 lb (117.935 kg)  BMI 47.55 kg/m2  SpO2 97%  Physical Exam  Nursing note and vitals reviewed. Constitutional: She appears well-developed and well-nourished.       Obese, in no acute distress  HENT:  Head: Normocephalic and  atraumatic.  Mouth/Throat: Oropharynx is clear and moist. No oropharyngeal exudate.  Eyes: Conjunctivae normal and EOM are normal. Pupils are equal, round, and reactive to light. Right eye exhibits no discharge. Left eye exhibits no discharge. No scleral icterus.  Neck: Normal range of motion. Neck supple. No JVD present. No thyromegaly present.  Cardiovascular: Regular rhythm, normal heart sounds and intact distal pulses.  Exam reveals no gallop and no friction rub.   No murmur heard.      Tachycardic to 115 on my exam  Pulmonary/Chest: Effort normal and breath sounds normal. No respiratory distress. She has no wheezes. She has no rales.  Abdominal: Soft. Bowel sounds are normal. She exhibits no distension and no mass. There is no tenderness.  Musculoskeletal: Normal range of motion. She exhibits tenderness (tenderness over the cellulitis of the right lower extremity). She exhibits no edema.  Lymphadenopathy:    She has no cervical adenopathy.  Neurological: She is alert. Coordination normal.       Motor and sensation intact to the bilateral lower extremities  Skin: Skin is warm and dry. Rash noted. There is erythema.       Erythema and warmth surrounding the right lower extremity below the knee and above the ankle. There is no tenderness of the joints, no pustules or vesicles. It is not circumferential, there is sparing on the lateral lower extremity above the ankle  Psychiatric: She has a normal mood and affect. Her behavior is normal.    ED Course  Procedures (including critical care time)  Labs Reviewed  CBC WITH DIFFERENTIAL - Abnormal; Notable for the following:    WBC 16.8 (*)     Neutrophils Relative 91 (*)     Neutro Abs 15.3 (*)     Lymphocytes Relative 7 (*)     All other components within normal limits  LACTIC ACID, PLASMA - Abnormal; Notable for the following:    Lactic Acid, Venous 3.5 (*)     All other components within normal limits  BASIC METABOLIC PANEL - Abnormal;  Notable for the following:    Sodium 132 (*)     Potassium 3.1 (*)     Chloride 93 (*)     Glucose, Bld 241 (*)     GFR calc non Af Amer 81 (*)     All other components within normal limits   No  results found.   1. Sepsis   2. Hyponatremia   3. Hypokalemia   4. Hyperglycemia   5. Cellulitis of right lower leg       MDM  The patient has signs and symptoms consistent with a cellulitis, she is febrile and tachycardic which I suspect is related to pain and fever. Will check a CBC, treat with medications, obtain lactic acid to rule out severe sepsis, vancomycin, reevaluate.  White blood cell count is close to 17,000, there is a leftward shift, she is hyponatremic, hypokalemic but has preserved renal function. She does have mild hyperglycemia. Her lactic acid is 3.5 which is elevated and is consistent with early sepsis. Her blood pressure has remained stable, her tachycardia has improved and is currently just below 100 and her fever has started to defervesce after Tylenol. At this time I have discussed with her the indications for admission including extensive cellulitis of her right lower extremity along with an elevated lactic acid. I will call the hospitalist for admission to the hospital. IV antibiotics have been given.  CRITICAL CARE Performed by: Vida Roller   Total critical care time: 30  Critical care time was exclusive of separately billable procedures and treating other patients.  Critical care was necessary to treat or prevent imminent or life-threatening deterioration.  Critical care was time spent personally by me on the following activities: development of treatment plan with patient and/or surrogate as well as nursing, discussions with consultants, evaluation of patient's response to treatment, examination of patient, obtaining history from patient or surrogate, ordering and performing treatments and interventions, ordering and review of laboratory studies, ordering and  review of radiographic studies, pulse oximetry and re-evaluation of patient's condition.       Vida Roller, MD 05/01/12 [phone]

## 2012-05-01 NOTE — ED Notes (Signed)
MD at bedside. 

## 2012-05-01 NOTE — H&P (Signed)
[facility] History and Physical  Shelby Morgan  ZOX:[id number]  DOB: 12-25-1955   DOA: 05/01/2012   PCP:   Colette Ribas, MD   Chief Complaint:  Inflammation and redness of the right leg since today  HPI: Shelby Morgan is an 56 y.o. female.   Obese, Middle-aged, Caucasian lady with a history of nonalcoholic fatty liver disease, and recurrent episodes of cellulitis of the right leg occurring about once a, reports that she started having fever and chills yesterday during the day than yesterday evening noticed sudden onset of extensive redness of the right leg. She came to the emergency room immediately hoping to be able to be treated as an outpatient but because of abnormal lab is a hospitalist was called to assist with management.  She denies headache chest pains or shortness of breath she denies nausea or vomiting denies passage of bloody or black stool.   No evidence of trauma to the leg Rewiew of Systems:   All systems negative except as marked bold or noted in the HPI;  Constitutional: Negative for malaise, . ; fever and chills Eyes: Negative for eye pain, redness and discharge. ;  ENMT: Negative for ear pain, hoarseness, nasal congestion, sinus pressure and sore throat. ;  Cardiovascular: Negative for chest pain, palpitations, diaphoresis, dyspnea and peripheral edema. ;  Respiratory: Negative for cough, hemoptysis, wheezing and stridor. ;  Gastrointestinal: Negative for nausea, vomiting, diarrhea, constipation, abdominal pain, melena, blood in stool, hematemesis, jaundice and rectal bleeding. unusual weight loss..   Genitourinary: Negative for frequency, dysuria, incontinence,flank pain and hematuria; Musculoskeletal: Negative for back pain and neck pain. Negative for swelling and trauma.;  Skin: . Negative for pruritus, rash, abrasions, bruising and skin lesion.; ulcerations Neuro: Negative for headache, lightheadedness and neck stiffness. Negative for weakness,  altered level of consciousness , altered mental status, extremity weakness, burning feet, involuntary movement, seizure and syncope.  Psych: negative for insomnia, tearfulness, panic attacks, hallucinations, paranoia, suicidal or homicidal ideation    Past Medical History  Diagnosis Date  . Diabetes mellitus   . Hypertension   . Bell's palsy 01/08/2011  . Hepatomegaly   . Chronic back pain   . Melanoma 2009    Dr Margo Aye, Stage 2, required only surgery  . Anxiety   . Depression   . NAFLD (nonalcoholic fatty liver disease)     Past Surgical History  Procedure Date  . Cesarean section   . Leg surgery     plates/screws/hx fx-right leg  . Melanoma excision     right knee  . Colonoscopy 03/09/2011    Dr Darrick Penna diverticulosis, internal and external hemorrhoids  . Esophagogastroduodenoscopy 03/09/2011    Esophageal stricture dilated to 16 MM savory, NSAID- induced gastritis and duodenitis  . Savory dilation 03/09/2011    Medications:  HOME MEDS: Prior to Admission medications   Medication Sig Start Date End Date Taking? Authorizing Provider  fish oil-omega-3 fatty acids 1000 MG capsule Take 1 g by mouth daily.     Yes Historical Provider, MD  hydrochlorothiazide 25 MG tablet Take 25 mg by mouth daily.     Yes Historical Provider, MD  metformin (FORTAMET) 500 MG (OSM) 24 hr tablet Take 500 mg by mouth daily with breakfast.     Yes Historical Provider, MD  Multiple Vitamin (MULTIVITAMIN) capsule Take 1 capsule by mouth daily.     Yes Historical Provider, MD  naproxen sodium (ANAPROX) 220 MG tablet Take 220 mg by mouth 2 (two) times daily with  a meal.     Yes Historical Provider, MD  pantoprazole (PROTONIX) 40 MG tablet Take 1 tablet (40 mg total) by mouth daily. 05/14/11 05/13/12 Yes Joselyn Arrow, NP  CALCIUM & MAGNESIUM CARBONATES PO Take 333 mg by mouth 2 (two) times daily. Calcium 333mg , mag 133mg , zinc 5mg  (combo pill)     Historical Provider, MD  calcium carbonate (TUMS - DOSED  IN MG ELEMENTAL CALCIUM) 500 MG chewable tablet Chew 1 tablet by mouth daily. As needed     Historical Provider, MD  Echinacea 500 MG CAPS Take 760 mg by mouth 2 (two) times daily.      Historical Provider, MD  escitalopram (LEXAPRO) 10 MG tablet Take 10 mg by mouth daily.      Historical Provider, MD  glucosamine-chondroitin 500-400 MG tablet Take 1 tablet by mouth 2 (two) times daily.      Historical Provider, MD  hydrocortisone (ANUSOL-HC) 25 MG suppository Place 1 suppository (25 mg total) rectally every 12 (twelve) hours. 05/14/11 05/13/12  Joselyn Arrow, NP  vitamin B-12 (CYANOCOBALAMIN) 500 MCG tablet Take 500 mcg by mouth daily.      Historical Provider, MD     Allergies:  Allergies  Allergen Reactions  . Codeine Nausea And Vomiting    Social History:   reports that she quit smoking about 19 years ago. Her smoking use included Cigarettes. She has a 5.5 pack-year smoking history. She does not have any smokeless tobacco history on file. She reports that she does not drink alcohol or use illicit drugs.  Family History: Family History  Problem Relation Age of Onset  . Anesthesia problems Neg Hx   . Hypotension Neg Hx   . Malignant hyperthermia Neg Hx   . Pseudochol deficiency Neg Hx      Physical Exam: Filed Vitals:   05/01/12 0202 05/01/12 0225 05/01/12 0242 05/01/12 0312  BP:  112/66  111/62  Pulse:  94  88  Temp: 99.8 F (37.7 C)  98.9 F (37.2 C) 99.1 F (37.3 C)  TempSrc: Oral  Oral Oral  Resp:  18    Height:      Weight:      SpO2:  98%  97%   Blood pressure 111/62, pulse 88, temperature 99.1 F (37.3 C), temperature source Oral, resp. rate 18, height 5\' 2"  (1.575 m), weight 117.935 kg (260 lb), SpO2 97.00%.  GEN:  Pleasant Caucasian and lady lying in the stretcher in no acute distress; cooperative with exam PSYCH:  alert and oriented x4;  does appear somewhat anxious; affect is appropriate. HEENT: Mucous membranes pink and anicteric; PERRLA; EOM intact; no  cervical lymphadenopathy nor thyromegaly or carotid bruit; no JVD; Breasts:: Not examined CHEST WALL: No tenderness CHEST: Normal respiration, clear to auscultation bilaterally HEART: Regular rate and rhythm; no murmurs rubs or gallops BACK: No kyphosis or scoliosis; no CVA tenderness ABDOMEN: Obese, distended by fat, soft non-tender; no masses, no organomegaly, bilateral flank dullness normal abdominal bowel sounds; no pannus; no intertriginous candida. Rectal Exam: Not done EXTREMITIES: Redness, tenderness of the leg from about an inch above the ankle to the midcalf ; no ulcerations Genitalia: not examined PULSES: 2+ and symmetric SKIN: Normal hydration, other than noted above, no rash or ulceration CNS: Cranial nerves 2-12 grossly intact no focal lateralizing neurologic deficit   Labs on Admission:  Basic Metabolic Panel:  Lab 05/01/12 3086  NA 132*  K 3.1*  CL 93*  CO2 25  GLUCOSE 241*  BUN 14  CREATININE 0.80  CALCIUM 9.6  MG --  PHOS --   Liver Function Tests: No results found for this basename: AST:5,ALT:5,ALKPHOS:5,BILITOT:5,PROT:5,ALBUMIN:5 in the last 168 hours No results found for this basename: LIPASE:5,AMYLASE:5 in the last 168 hours No results found for this basename: AMMONIA:5 in the last 168 hours CBC:  Lab 05/01/12 0114  WBC 16.8*  NEUTROABS 15.3*  HGB 13.5  HCT 39.9  MCV 84.9  PLT 319   Cardiac Enzymes: No results found for this basename: CKTOTAL:5,CKMB:5,CKMBINDEX:5,TROPONINI:5 in the last 168 hours BNP: No components found with this basename: POCBNP:5 D-dimer: No components found with this basename: D-DIMER:5 CBG: No results found for this basename: GLUCAP:5 in the last 168 hours  Radiological Exams on Admission: No results found.    Assessment/Plan Present on Admission:  .Cellulitis of right leg .GERD (gastroesophageal reflux disease) .NAFLD (nonalcoholic fatty liver disease) .Anxiety .Obesity  .Diabetes mellitus type 2 in  obese   PLAN: Admit this lady for IV fluid and intravenous antibiotics. Continue management of her chronic medical conditions, including encouragement for weight loss  Other plans as per orders.  Code Status: FULL CODE  Family Communication: Plans discussed with patient and her husband was at the interview. Disposition Plan: Home in a couple of days on oral antibiotics   Katryn Plummer Nocturnist [facility] Pager [phone]   05/01/2012, 5:05 AM

## 2012-05-01 NOTE — Progress Notes (Signed)
The patient is a 56 year old woman with a history significant for right lower extremity cellulitis, diabetes mellitus, and nonalcoholic fatty liver disease, who was admitted this morning by Dr. Orvan Falconer for recurrent right lower extremity cellulitis. She was briefly seen. Her chart, vital signs, laboratory studies were reviewed. We'll continue antibiotics as ordered. We'll continue IV fluid hydration as ordered. We'll add potassium chloride supplementation. We'll give her IV magnesium sulfate for borderline hypomagnesemia. Will continue to treat her supportively.

## 2012-05-02 ENCOUNTER — Observation Stay (HOSPITAL_COMMUNITY): Payer: Self-pay

## 2012-05-02 DIAGNOSIS — R7989 Other specified abnormal findings of blood chemistry: Secondary | ICD-10-CM | POA: Diagnosis present

## 2012-05-02 DIAGNOSIS — E872 Acidosis, unspecified: Secondary | ICD-10-CM

## 2012-05-02 LAB — GLUCOSE, CAPILLARY: Glucose-Capillary: 147 mg/dL — ABNORMAL HIGH (ref 70–99)

## 2012-05-02 LAB — CBC
MCH: 28.7 pg (ref 26.0–34.0)
MCHC: 33.1 g/dL (ref 30.0–36.0)
RDW: 13.7 % (ref 11.5–15.5)

## 2012-05-02 LAB — COMPREHENSIVE METABOLIC PANEL
ALT: 48 U/L — ABNORMAL HIGH (ref 0–35)
Albumin: 2.7 g/dL — ABNORMAL LOW (ref 3.5–5.2)
Alkaline Phosphatase: 53 U/L (ref 39–117)
Calcium: 8.7 mg/dL (ref 8.4–10.5)
GFR calc Af Amer: 87 mL/min — ABNORMAL LOW (ref >90)
Potassium: 4 mEq/L (ref 3.5–5.1)
Sodium: 140 mEq/L (ref 135–145)
Total Protein: 6.5 g/dL (ref 6.0–8.3)

## 2012-05-02 MED ORDER — SODIUM CHLORIDE 0.9 % IJ SOLN
INTRAMUSCULAR | Status: AC
  Administered 2012-05-02: 10 mL
  Filled 2012-05-02: qty 3

## 2012-05-02 MED ORDER — VANCOMYCIN HCL IN DEXTROSE 1-5 GM/200ML-% IV SOLN
1000.0000 mg | Freq: Three times a day (TID) | INTRAVENOUS | Status: DC
  Administered 2012-05-02 – 2012-05-04 (×6): 1000 mg via INTRAVENOUS
  Filled 2012-05-02 (×9): qty 200

## 2012-05-02 MED ORDER — INFLUENZA VIRUS VACC SPLIT PF IM SUSP
0.5000 mL | INTRAMUSCULAR | Status: AC
  Administered 2012-05-03: 0.5 mL via INTRAMUSCULAR
  Filled 2012-05-02: qty 0.5

## 2012-05-02 MED ORDER — PNEUMOCOCCAL VAC POLYVALENT 25 MCG/0.5ML IJ INJ
0.5000 mL | INJECTION | INTRAMUSCULAR | Status: AC
  Administered 2012-05-03: 0.5 mL via INTRAMUSCULAR
  Filled 2012-05-02: qty 0.5

## 2012-05-02 MED ORDER — HYDROCODONE-ACETAMINOPHEN 5-325 MG PO TABS
1.0000 | ORAL_TABLET | ORAL | Status: DC | PRN
  Administered 2012-05-03 – 2012-05-04 (×3): 1 via ORAL
  Filled 2012-05-02 (×3): qty 1

## 2012-05-02 MED ORDER — IBUPROFEN 600 MG PO TABS
600.0000 mg | ORAL_TABLET | Freq: Three times a day (TID) | ORAL | Status: DC
  Administered 2012-05-02 – 2012-05-03 (×4): 600 mg via ORAL
  Filled 2012-05-02 (×4): qty 1

## 2012-05-02 NOTE — Progress Notes (Signed)
ANTIBIOTIC CONSULT NOTE - INITIAL  Pharmacy Consult for Vancomycin Indication: cellulitis  Allergies  Allergen Reactions  . Codeine Nausea And Vomiting    Patient Measurements: Height: 5\' 2"  (157.5 cm) Weight: 260 lb (117.935 kg) IBW/kg (Calculated) : 50.1   Vital Signs: Temp: 98.6 F (37 C) (10/07 1118) Temp src: Oral (10/07 1118) BP: 144/78 mmHg (10/07 1118) Pulse Rate: 75  (10/07 1118) Intake/Output from previous day: 10/06 0701 - 10/07 0700 In: 640 [P.O.:640] Out: -  Intake/Output from this shift: Total I/O In: 10 [I.V.:10] Out: -   Labs:  Basename 05/02/12 0513 05/01/12 0114  WBC 8.6 16.8*  HGB 11.7* 13.5  PLT 262 319  LABCREA -- --  CREATININE 0.86 0.80   Estimated Creatinine Clearance: 90.1 ml/min (by C-G formula based on Cr of 0.86). No results found for this basename: VANCOTROUGH:2,VANCOPEAK:2,VANCORANDOM:2,GENTTROUGH:2,GENTPEAK:2,GENTRANDOM:2,TOBRATROUGH:2,TOBRAPEAK:2,TOBRARND:2,AMIKACINPEAK:2,AMIKACINTROU:2,AMIKACIN:2, in the last 72 hours   Microbiology: No results found for this or any previous visit (from the past 720 hour(s)).  Medical History: Past Medical History  Diagnosis Date  . Diabetes mellitus   . Hypertension   . Bell's palsy 01/08/2011  . Hepatomegaly   . Chronic back pain   . Melanoma 2009    Dr Margo Aye, Stage 2, required only surgery  . Anxiety   . Depression   . NAFLD (nonalcoholic fatty liver disease)     Medications:  Scheduled:    . doxycycline (VIBRAMYCIN) IV  100 mg Intravenous Q12H  . enoxaparin (LOVENOX) injection  40 mg Subcutaneous Q24H  . ibuprofen  600 mg Oral TID  . insulin aspart  0-5 Units Subcutaneous QHS  . insulin aspart  0-9 Units Subcutaneous TID WC  . magnesium sulfate 1 - 4 g bolus IVPB  2 g Intravenous Once  . metformin  500 mg Oral BID WC  . omega-3 acid ethyl esters  1 capsule Oral Daily  . pantoprazole  40 mg Oral Daily  . potassium chloride  40 mEq Oral BID  . sodium chloride      . vancomycin   1,000 mg Intravenous Q8H   Assessment: 56yo female with recurrent LE cellulitis.  Currently on Doxycycline.  MD adding Vancomycin until improved.  Good renal fxn.  Pt is obese.  Estimated Creatinine Clearance: 90.1 ml/min (by C-G formula based on Cr of 0.86).  Goal of Therapy:  Vancomycin trough level 10-15 mcg/ml  Plan: Vancomycin 1gm iv q8hrs Trough at steady state Monitor labs, renal fxn, and cultures per protocol  Valrie Hart A 05/02/2012,11:19 AM

## 2012-05-02 NOTE — Progress Notes (Signed)
Subjective: The patient complains of a global headache. She is not sure if it's from the IV Dilaudid or the IV antibiotics. She denies visual changes, nausea, or dizziness.  Objective: Vital signs in last 24 hours: Filed Vitals:   05/01/12 1023 05/01/12 1458 05/01/12 2216 05/02/12 0525  BP: 135/58 116/72 119/79 117/78  Pulse: 80 86 77 85  Temp: 98.2 F (36.8 C) 98.8 F (37.1 C) 98.3 F (36.8 C) 98.7 F (37.1 C)  TempSrc: Oral Oral Oral Oral  Resp: 18 18 17 18   Height:      Weight:      SpO2: 95% 98% 97% 94%    Intake/Output Summary (Last 24 hours) at 05/02/12 1056 Last data filed at 05/02/12 0806  Gross per 24 hour  Intake    650 ml  Output      0 ml  Net    650 ml    Weight change:   Physical exam: General: Pleasant obese   56 year old Caucasian woman lying in bed, in no acute distress. HEENT: Head is normocephalic, nontraumatic. Pupils are equal, round, and reactive to light. Lungs: Clear to auscultation bilaterally. Heart: S1, S2, with a soft systolic murmur. Abdomen: Obese, positive bowel sounds, soft, nontender, nondistended. Extremities: Extensive erythema on the right leg, below the knee, and just above the ankle. There is some mild fading of the erythema below the knee. There is global 1+ nonpitting edema and mild to moderate warmth and tenderness to palpation. Pedal pulses palpable. Neurologic: She is alert and oriented x3. Cranial nerves II through XII are intact.   Lab Results: Basic Metabolic Panel:  Basename 05/02/12 0513 05/01/12 0114  NA 140 132*  K 4.0 3.1*  CL 105 93*  CO2 26 25  GLUCOSE 142* 241*  BUN 11 14  CREATININE 0.86 0.80  CALCIUM 8.7 9.6  MG -- 1.5  PHOS -- --   Liver Function Tests:  Basename 05/02/12 0513 05/01/12 0114  AST 26 39*  ALT 48* 72*  ALKPHOS 53 54  BILITOT 0.3 0.4  PROT 6.5 7.4  ALBUMIN 2.7* 3.4*   No results found for this basename: LIPASE:2,AMYLASE:2 in the last 72 hours No results found for this basename:  AMMONIA:2 in the last 72 hours CBC:  Basename 05/02/12 0513 05/01/12 0114  WBC 8.6 16.8*  NEUTROABS -- 15.3*  HGB 11.7* 13.5  HCT 35.4* 39.9  MCV 87.0 84.9  PLT 262 319   Cardiac Enzymes: No results found for this basename: CKTOTAL:3,CKMB:3,CKMBINDEX:3,TROPONINI:3 in the last 72 hours BNP: No results found for this basename: PROBNP:3 in the last 72 hours D-Dimer: No results found for this basename: DDIMER:2 in the last 72 hours CBG:  Basename 05/02/12 0729 05/01/12 2118 05/01/12 1631 05/01/12 1213 05/01/12 0746  GLUCAP 147* 119* 157* 192* 196*   Hemoglobin A1C:  Basename 05/01/12 0111  HGBA1C 7.0*   Fasting Lipid Panel: No results found for this basename: CHOL,HDL,LDLCALC,TRIG,CHOLHDL,LDLDIRECT in the last 72 hours Thyroid Function Tests:  Basename 05/01/12 0111  TSH 1.011  T4TOTAL --  FREET4 --  T3FREE --  THYROIDAB --   Anemia Panel: No results found for this basename: VITAMINB12,FOLATE,FERRITIN,TIBC,IRON,RETICCTPCT in the last 72 hours Coagulation:  Basename 05/01/12 0111  LABPROT 14.0  INR 1.09   Urine Drug Screen: Drugs of Abuse  No results found for this basename: labopia,  cocainscrnur,  labbenz,  amphetmu,  thcu,  labbarb    Alcohol Level: No results found for this basename: ETH:2 in the last 72 hours Urinalysis: No  results found for this basename: COLORURINE:2,APPERANCEUR:2,LABSPEC:2,PHURINE:2,GLUCOSEU:2,HGBUR:2,BILIRUBINUR:2,KETONESUR:2,PROTEINUR:2,UROBILINOGEN:2,NITRITE:2,LEUKOCYTESUR:2 in the last 72 hours Misc. Labs:   Micro: No results found for this or any previous visit (from the past 240 hour(s)).  Studies/Results: No results found.  Medications:  Scheduled:   . doxycycline (VIBRAMYCIN) IV  100 mg Intravenous Q12H  . enoxaparin (LOVENOX) injection  40 mg Subcutaneous Q24H  . ibuprofen  600 mg Oral TID  . insulin aspart  0-5 Units Subcutaneous QHS  . insulin aspart  0-9 Units Subcutaneous TID WC  . magnesium sulfate 1 - 4 g bolus  IVPB  2 g Intravenous Once  . metformin  500 mg Oral BID WC  . omega-3 acid ethyl esters  1 capsule Oral Daily  . pantoprazole  40 mg Oral Daily  . potassium chloride  40 mEq Oral BID  . sodium chloride       Continuous:   . 0.9 % NaCl with KCl 20 mEq / L 50 mL/hr at 05/02/12 1046   ZOX:[id number], bisacodyl, HYDROcodone-acetaminophen, ondansetron (ZOFRAN) IV, ondansetron, sodium phosphate, traZODone, DISCONTD:  HYDROmorphone (DILAUDID) injection  Assessment: Principal Problem:  *Cellulitis of right leg Active Problems:  Anxiety  GERD (gastroesophageal reflux disease)  NAFLD (nonalcoholic fatty liver disease)  Obesity  Diabetes mellitus type 2 in obese  Hypokalemia  Hyponatremia  Elevated LFTs  Lactic acidosis    1. Recurrent right lower extremity cellulitis. The erythema is virtually unchanged from yesterday, but her fever and leukocytosis have resolved. The patient is on IV doxycycline. Apparently she was given IV vancomycin in the emergency department, but it was not continued. Will add vancomycin and continue doxycycline until more noticeable improvement in the cellulitis.  Hyponatremia, secondary to hypovolemia. Resolved with IV fluid hydration.  Type 2 diabetes mellitus.  Improving blood glucose on metformin and add sliding scale NovoLog. Hemoglobin A1c is 7.0. Of note, her lactic acid level was elevated at 3.5 on admission. Will check another lactic acid level, and if it is still elevated, consider discontinuing metformin.  Hypokalemia and borderline hypomagnesemia. Resolved. Status post oral potassium chloride and IV magnesium sulfate.  Elevated LFTs secondary to nonalcoholic fatty liver disease. Stable.  Mild global headache. Possibly secondary to IV hydromorphone. This will be discontinued.  Anemia, likely dilutional from IV fluids.  Plan:  1. We'll add IV vancomycin. We'll continue IV doxycycline for another day or 2 for double coverage in the setting of  recurrent right leg cellulitis. 2. KVO IV fluids. 3. Discontinue IV hydromorphone. We'll start as needed hydrocodone. We'll also add ibuprofen 3 times a day for the next 24 hours to help with headache and inflammation. 4. Check another lactic acid level. If it is still elevated, consider discontinuing metformin. 5. We'll order a right lower extremity venous ultrasound to rule out DVT.   LOS: 1 day   Sie Formisano 05/02/2012, 10:56 AM

## 2012-05-02 NOTE — Care Management Note (Signed)
    Page 1 of 1   05/04/2012     2:46:02 PM   CARE MANAGEMENT NOTE 05/04/2012  Patient:  [facility]   Account Number:  [id number]  Date Initiated:  05/02/2012  Documentation initiated by:  Rosemary Holms  Subjective/Objective Assessment:   Pt admitted with cellulitis on the right leg. Lives at home with her spouse who "takes good care of her". States she has previously been hospitalized with this but it was worse and she had to go home on IV antibiotics. She does not...     Action/Plan:   anticipate DC on IVs but PO antibiotics.   Anticipated DC Date:  05/04/2012   Anticipated DC Plan:  HOME/SELF CARE      DC Planning Services  CM consult      Choice offered to / List presented to:             Status of service:  Completed, signed off Medicare Important Message given?   (If response is "NO", the following Medicare IM given date fields will be blank) Date Medicare IM given:   Date Additional Medicare IM given:    Discharge Disposition:  HOME/SELF CARE  Per UR Regulation:    If discussed at Long Length of Stay Meetings, dates discussed:    Comments:  05/04/12 1445 Jeriah Skufca Leanord Hawking RN BSN CM [facility] RN ordered. CM spoke to pt. She and spouse declined [facility] RN stating that they would watch for leg to worsen and follow up imediately.  05/02/12 1145 Layna Roeper Leanord Hawking RN BSN CM

## 2012-05-03 DIAGNOSIS — F411 Generalized anxiety disorder: Secondary | ICD-10-CM

## 2012-05-03 LAB — GLUCOSE, CAPILLARY
Glucose-Capillary: 154 mg/dL — ABNORMAL HIGH (ref 70–99)
Glucose-Capillary: 126 mg/dL — ABNORMAL HIGH (ref 70–99)
Glucose-Capillary: 120 mg/dL — ABNORMAL HIGH (ref 70–99)
Glucose-Capillary: 140 mg/dL — ABNORMAL HIGH (ref 70–99)

## 2012-05-03 LAB — VANCOMYCIN, TROUGH: Vancomycin Tr: 10.8 ug/mL (ref 10.0–20.0)

## 2012-05-03 MED ORDER — SODIUM CHLORIDE 0.9 % IJ SOLN
INTRAMUSCULAR | Status: AC
  Administered 2012-05-03: 17:00:00
  Filled 2012-05-03: qty 3

## 2012-05-03 MED ORDER — SODIUM CHLORIDE 0.9 % IJ SOLN
INTRAMUSCULAR | Status: AC
  Filled 2012-05-03: qty 3

## 2012-05-03 MED ORDER — DOXYCYCLINE HYCLATE 100 MG PO TABS
100.0000 mg | ORAL_TABLET | Freq: Two times a day (BID) | ORAL | Status: DC
  Administered 2012-05-03 – 2012-05-04 (×2): 100 mg via ORAL
  Filled 2012-05-03 (×2): qty 1

## 2012-05-03 MED ORDER — PANTOPRAZOLE SODIUM 40 MG PO TBEC
40.0000 mg | DELAYED_RELEASE_TABLET | Freq: Every day | ORAL | Status: DC
  Administered 2012-05-03 – 2012-05-04 (×2): 40 mg via ORAL
  Filled 2012-05-03 (×2): qty 1

## 2012-05-03 NOTE — Progress Notes (Signed)
ANTIBIOTIC CONSULT NOTE   Pharmacy Consult for Vancomycin Indication: cellulitis  Allergies  Allergen Reactions  . Codeine Nausea And Vomiting   Patient Measurements: Height: 5\' 2"  (157.5 cm) Weight: 231 lb 14.8 oz (105.2 kg) IBW/kg (Calculated) : 50.1   Vital Signs: Temp: 97.6 F (36.4 C) (10/08 1131) Temp src: Oral (10/08 1131) BP: 137/67 mmHg (10/08 0600) Pulse Rate: 68  (10/08 0832) Intake/Output from previous day: 10/07 0701 - 10/08 0700 In: 2349 [P.O.:960; I.V.:289; IV Piggyback:1100] Out: -  Intake/Output from this shift: Total I/O In: 480 [P.O.:480] Out: -   Labs:  Basename 05/02/12 0513 05/01/12 0114  WBC 8.6 16.8*  HGB 11.7* 13.5  PLT 262 319  LABCREA -- --  CREATININE 0.86 0.80   Estimated Creatinine Clearance: 84.1 ml/min (by C-G formula based on Cr of 0.86).  Basename 05/03/12 1240  VANCOTROUGH 10.8  VANCOPEAK --  VANCORANDOM --  GENTTROUGH --  GENTPEAK --  GENTRANDOM --  TOBRATROUGH --  TOBRAPEAK --  TOBRARND --  AMIKACINPEAK --  AMIKACINTROU --  AMIKACIN --    Microbiology: No results found for this or any previous visit (from the past 720 hour(s)).  Medical History: Past Medical History  Diagnosis Date  . Diabetes mellitus   . Hypertension   . Bell's palsy 01/08/2011  . Hepatomegaly   . Chronic back pain   . Melanoma 2009    Dr Margo Aye, Stage 2, required only surgery  . Anxiety   . Depression   . NAFLD (nonalcoholic fatty liver disease)    Medications:  Scheduled:     . doxycycline  100 mg Oral Q12H  . enoxaparin (LOVENOX) injection  40 mg Subcutaneous Q24H  . ibuprofen  600 mg Oral TID  . influenza  inactive virus vaccine  0.5 mL Intramuscular Tomorrow-1000  . insulin aspart  0-5 Units Subcutaneous QHS  . insulin aspart  0-9 Units Subcutaneous TID WC  . metformin  500 mg Oral BID WC  . omega-3 acid ethyl esters  1 capsule Oral Daily  . pantoprazole  40 mg Oral QAC breakfast  . pneumococcal 23 valent vaccine  0.5 mL  Intramuscular Tomorrow-1000  . sodium chloride      . vancomycin  1,000 mg Intravenous Q8H  . DISCONTD: doxycycline (VIBRAMYCIN) IV  100 mg Intravenous Q12H  . DISCONTD: pantoprazole  40 mg Oral Daily   Assessment: 56yo female with recurrent LE cellulitis.  Currently on Doxycycline.  MD adding Vancomycin until improved.  Good renal fxn.  Pt is obese.  Estimated Creatinine Clearance: 84.1 ml/min (by C-G formula based on Cr of 0.86).  Trough level is on target.  Goal of Therapy:  Vancomycin trough level 10-15 mcg/ml  Plan: Vancomycin 1gm iv q8hrs Trough weekly while on Vancomycin Monitor labs, renal fxn, and cultures per protocol  Valrie Hart A 05/03/2012,1:55 PM

## 2012-05-03 NOTE — Progress Notes (Signed)
UR Chart Review Completed  

## 2012-05-03 NOTE — Progress Notes (Signed)
Pt's IV leaking at site.  IV removed.  Pt tolerated well.

## 2012-05-03 NOTE — Plan of Care (Signed)
Problem: Phase III Progression Outcomes Goal: Discharge plan remains appropriate-arrangements made Outcome: Completed/Met Date Met:  05/03/12 Plans to discharge home.

## 2012-05-03 NOTE — Plan of Care (Signed)
Problem: Phase II Progression Outcomes Goal: Discharge plan established Outcome: Completed/Met Date Met:  05/03/12 Plans to discharge home.

## 2012-05-03 NOTE — Progress Notes (Signed)
[facility]             Progress Note   Subjective: Feels right leg is doing a little better today.  It is still swollen and somewhat painful, but she feels it is improving  Objective: Vital signs in last 24 hours: Temp:  [97.4 F (36.3 C)-98.4 F (36.9 C)] 97.6 F (36.4 C) (10/08 1131) Pulse Rate:  [68-79] 68  (10/08 0832) Resp:  [18-20] 20  (10/08 0600) BP: (123-137)/(67-93) 137/67 mmHg (10/08 0600) SpO2:  [96 %-99 %] 96 % (10/08 0832) Weight:  [105.2 kg (231 lb 14.8 oz)] 105.2 kg (231 lb 14.8 oz) (10/08 0500) Weight change:  Last BM Date: 05/02/12  Intake/Output from previous day: 10/07 0701 - 10/08 0700 In: 2349 [P.O.:960; I.V.:289; IV Piggyback:1100] Out: -  Total I/O In: 480 [P.O.:480] Out: -    Physical Exam: General: Alert, awake, oriented x3, in no acute distress. HEENT: No bruits, no goiter. Heart: Regular rate and rhythm, without murmurs, rubs, gallops. Lungs: Clear to auscultation bilaterally. Abdomen: Soft, nontender, nondistended, positive bowel sounds. Extremities: Right lower extremity erythema is retracting from pen marked borders, no visible drainage Neuro: Grossly intact, nonfocal.    Lab Results: Basic Metabolic Panel:  Basename 05/02/12 0513 05/01/12 0114  NA 140 132*  K 4.0 3.1*  CL 105 93*  CO2 26 25  GLUCOSE 142* 241*  BUN 11 14  CREATININE 0.86 0.80  CALCIUM 8.7 9.6  MG -- 1.5  PHOS -- --   Liver Function Tests:  Basename 05/02/12 0513 05/01/12 0114  AST 26 39*  ALT 48* 72*  ALKPHOS 53 54  BILITOT 0.3 0.4  PROT 6.5 7.4  ALBUMIN 2.7* 3.4*   No results found for this basename: LIPASE:2,AMYLASE:2 in the last 72 hours No results found for this basename: AMMONIA:2 in the last 72 hours CBC:  Basename 05/02/12 0513 05/01/12 0114  WBC 8.6 16.8*  NEUTROABS -- 15.3*  HGB 11.7* 13.5  HCT 35.4* 39.9  MCV 87.0 84.9  PLT 262 319   Cardiac Enzymes: No results found for this basename:  CKTOTAL:3,CKMB:3,CKMBINDEX:3,TROPONINI:3 in the last 72 hours BNP: No results found for this basename: PROBNP:3 in the last 72 hours D-Dimer: No results found for this basename: DDIMER:2 in the last 72 hours CBG:  Basename 05/03/12 1125 05/03/12 0721 05/02/12 2113 05/02/12 1643 05/02/12 1200 05/02/12 0729  GLUCAP 126* 154* 170* 126* 161* 147*   Hemoglobin A1C:  Basename 05/01/12 0111  HGBA1C 7.0*   Fasting Lipid Panel: No results found for this basename: CHOL,HDL,LDLCALC,TRIG,CHOLHDL,LDLDIRECT in the last 72 hours Thyroid Function Tests:  Basename 05/01/12 0111  TSH 1.011  T4TOTAL --  FREET4 --  T3FREE --  THYROIDAB --   Anemia Panel: No results found for this basename: VITAMINB12,FOLATE,FERRITIN,TIBC,IRON,RETICCTPCT in the last 72 hours Coagulation:  Basename 05/01/12 0111  LABPROT 14.0  INR 1.09   Urine Drug Screen: Drugs of Abuse  No results found for this basename: labopia, cocainscrnur, labbenz, amphetmu, thcu, labbarb    Alcohol Level: No results found for this basename: ETH:2 in the last 72 hours Urinalysis: No results found for this basename: COLORURINE:2,APPERANCEUR:2,LABSPEC:2,PHURINE:2,GLUCOSEU:2,HGBUR:2,BILIRUBINUR:2,KETONESUR:2,PROTEINUR:2,UROBILINOGEN:2,NITRITE:2,LEUKOCYTESUR:2 in the last 72 hours  No results found for this or any previous visit (from the past 240 hour(s)).  Studies/Results: US Venous Img Lower Unilateral Right  05/02/2012  *RADIOLOGY REPORT*  Clinical Data: Right lower extremity swelling and redness.  History of diabetes.  RIGHT LOWER EXTREMITY VENOUS DUPLEX ULTRASOUND  Technique:  Gray-scale sonography with graded compression, as well  as color Doppler and duplex ultrasound were performed to evaluate the deep venous system of the lower extremity from the level of the common femoral vein through the popliteal and proximal calf veins. Spectral Doppler was utilized to evaluate flow at rest and with distal augmentation maneuvers.   Comparison:  06/19/2009  Findings:  Normal compressibility of the common femoral, superficial femoral, and popliteal veins is demonstrated, as well as the visualized proximal calf veins.  No filling defects to suggest DVT on grayscale or color Doppler imaging.  Doppler waveforms show normal direction of venous flow, normal respiratory phasicity and response to augmentation. Soft tissue edema is noted in the right calf area.  IMPRESSION: No evidence of lower extremity deep vein thrombosis.   Original Report Authenticated By: Harrel Lemon, M.D.     Medications: Scheduled Meds:   . doxycycline  100 mg Oral Q12H  . enoxaparin (LOVENOX) injection  40 mg Subcutaneous Q24H  . ibuprofen  600 mg Oral TID  . influenza  inactive virus vaccine  0.5 mL Intramuscular Tomorrow-1000  . insulin aspart  0-5 Units Subcutaneous QHS  . insulin aspart  0-9 Units Subcutaneous TID WC  . metformin  500 mg Oral BID WC  . omega-3 acid ethyl esters  1 capsule Oral Daily  . pantoprazole  40 mg Oral QAC breakfast  . pneumococcal 23 valent vaccine  0.5 mL Intramuscular Tomorrow-1000  . sodium chloride      . vancomycin  1,000 mg Intravenous Q8H  . DISCONTD: doxycycline (VIBRAMYCIN) IV  100 mg Intravenous Q12H  . DISCONTD: pantoprazole  40 mg Oral Daily   Continuous Infusions:   . 0.9 % NaCl with KCl 20 mEq / L 20 mL/hr (05/03/12 0846)   PRN Meds:.acetaminophen, bisacodyl, HYDROcodone-acetaminophen, ondansetron (ZOFRAN) IV, ondansetron, traZODone  Assessment/Plan:  Principal Problem:  *Cellulitis of right leg Active Problems:  Anxiety  GERD (gastroesophageal reflux disease)  NAFLD (nonalcoholic fatty liver disease)  Obesity  Diabetes mellitus type 2 in obese  Hypokalemia  Hyponatremia  Elevated LFTs  Lactic acidosis  1. Cellulitis.  She is on empiric antibiotics. She is now afebrile and it appears that her cellulitis is improving. She has been encouraged to keep her leg elevated.  If she continues to  improve, then she can likely be transitioned to po abx tomorrow. Venous doppler negative for DVT.  2. Type 2 DM.  On metformin, a1c is 7  3. Elevated LFTs, likely due to NAFLD, trending down  4. Dispo.  If she continues to improve, then possible discharge home in AM     Time spent coordinating care:   LOS: 2 days   Hali Balgobin [facility] Pager: [phone] 05/03/2012, 3:26 PM

## 2012-05-04 LAB — GLUCOSE, CAPILLARY
Glucose-Capillary: 124 mg/dL — ABNORMAL HIGH (ref 70–99)
Glucose-Capillary: 158 mg/dL — ABNORMAL HIGH (ref 70–99)

## 2012-05-04 MED ORDER — ONDANSETRON HCL 4 MG/2ML IJ SOLN: 4.0000 mg | Freq: Three times a day (TID) | INTRAMUSCULAR | Status: DC | PRN

## 2012-05-04 MED ORDER — SULFAMETHOXAZOLE-TRIMETHOPRIM 800-160 MG PO TABS: 1.0000 | ORAL_TABLET | Freq: Two times a day (BID) | ORAL | Status: DC

## 2012-05-04 MED ORDER — VANCOMYCIN HCL IN DEXTROSE 1-5 GM/200ML-% IV SOLN: 1000.0000 mg | INTRAVENOUS | Status: DC

## 2012-05-04 MED ORDER — HYDROCODONE-ACETAMINOPHEN 5-325 MG PO TABS: 1.0000 | ORAL_TABLET | Freq: Four times a day (QID) | ORAL | Status: DC | PRN

## 2012-05-04 NOTE — Progress Notes (Signed)
Discharge instructions given to pt. With understanding and teach back given to RN. Pt. Placed in W/C and taken to car.  Pt. Refused Home Health at home.

## 2012-05-04 NOTE — Discharge Summary (Signed)
Physician Discharge Summary  Shelby Morgan:[id number] DOB: [birth year] DOA: 05/01/2012  PCP: Colette Ribas, MD  Admit date: 05/01/2012 Discharge date: 05/04/2012  Recommendations for Outpatient Follow-up:  1. Follow up with primary doctor in 1-2 weeks 2. Home health nursing will be set up to check on cellulitis  Discharge Diagnoses:  Principal Problem:  *Cellulitis of right leg Active Problems:  Anxiety  GERD (gastroesophageal reflux disease)  NAFLD (nonalcoholic fatty liver disease)  Obesity  Diabetes mellitus type 2 in obese  Hypokalemia  Hyponatremia  Elevated LFTs  Lactic acidosis   Discharge Condition: improved  Diet recommendation: low calorie, low salt  Filed Weights   05/01/12 0036 05/01/12 0557 05/03/12 0500  Weight: 117.935 kg (260 lb) 117.935 kg (260 lb) 105.2 kg (231 lb 14.8 oz)    History of present illness:  Shelby Morgan is an 56 y.o. female. Obese, Middle-aged, Caucasian lady with a history of nonalcoholic fatty liver disease, and recurrent episodes of cellulitis of the right leg occurring about once a, reports that she started having fever and chills yesterday during the day than yesterday evening noticed sudden onset of extensive redness of the right leg. She came to the emergency room immediately hoping to be able to be treated as an outpatient but because of abnormal lab is a hospitalist was called to assist with management.  She denies headache chest pains or shortness of breath she denies nausea or vomiting denies passage of bloody or black stool.   Hospital Course:  This lady was admitted to the hospital with a cellulitis. Venous dopplers of the right lower extremity did not reveal any underlying DVT.  She was started on empiric IV antibiotics. Through her hospital course her cellulitis has significantly improved.  She has not had a fever and has a normal wbc count.  Her erythema has significantly retracted from the marked margins.  She has  developed a large blister, but overall her swelling and erythema has improved.  She will be transitioned to po abx and will be discharged home today to follow up with her primary care doctor.  Procedures:  none  Consultations:  none  Discharge Exam: Filed Vitals:   05/03/12 2052 05/04/12 0200 05/04/12 0612 05/04/12 1047  BP: 138/83 137/86 142/81 136/84  Pulse: 74 72 71 71  Temp: 98 F (36.7 C) 98.4 F (36.9 C) 98.1 F (36.7 C) 99.2 F (37.3 C)  TempSrc: Oral Oral Oral Oral  Resp: 18 18 20 20   Height:      Weight:      SpO2: 97% 97%  94%    General: NAD Cardiovascular: s1 s2 rrr Respiratory: cta b Ext: redness over RLE is continuing to improve.  Discharge Instructions  Discharge Orders    Future Orders Please Complete By Expires   Diet - low sodium heart healthy      Home Health      Questions: Responses:   To provide the following care/treatments RN   Face-to-face encounter      Comments:   I Masaru Chamberlin certify that this patient is under my care and that I, or a nurse practitioner or physician's assistant working with me, had a face-to-face encounter that meets the physician face-to-face encounter requirements with this patient on 05/04/2012.   Questions: Responses:   The encounter with the patient was in whole, or in part, for the following medical condition, which is the primary reason for home health care cellulitis   I certify that, based on my findings,  the following services are medically necessary home health services Nursing   My clinical findings support the need for the above services Patients with multiple co-morbidities (wounds and uncontrolled DM for example)   Further, I certify that my clinical findings support that this patient is homebound due to: Open/draining pressure/stasis ulcer   To provide the following care/treatments RN   Increase activity slowly      Discharge wound care:      Comments:   Place dry clean gauze on blister after it  ruptures   Call MD for:  temperature >100.4      Call MD for:  redness, tenderness, or signs of infection (pain, swelling, redness, odor or green/yellow discharge around incision site)      Call MD for:  severe uncontrolled pain          Medication List     As of 05/04/2012  2:07 PM    TAKE these medications         ALEVE 220 MG tablet   Generic drug: naproxen sodium   Take 220 mg by mouth 2 (two) times daily as needed. Back pain.      fish oil-omega-3 fatty acids 1000 MG capsule   Take 1 g by mouth daily.      hydrochlorothiazide 25 MG tablet   Commonly known as: HYDRODIURIL   Take 25 mg by mouth daily.      HYDROcodone-acetaminophen 5-325 MG per tablet   Commonly known as: NORCO/VICODIN   Take 1 tablet by mouth every 6 (six) hours as needed for pain.      metFORMIN 500 MG 24 hr tablet   Commonly known as: GLUCOPHAGE-XR   Take 500 mg by mouth 2 (two) times daily.      multivitamin capsule   Take 1 capsule by mouth daily.      pantoprazole 40 MG tablet   Commonly known as: PROTONIX   Take 1 tablet (40 mg total) by mouth daily.      sulfamethoxazole-trimethoprim 800-160 MG per tablet   Commonly known as: BACTRIM DS,SEPTRA DS   Take 1 tablet by mouth 2 (two) times daily.           Follow-up Information    Follow up with Colette Ribas, MD. Schedule an appointment as soon as possible for a visit in 2 weeks.   Contact information:   [address]  [place] [address] [phone]           The results of significant diagnostics from this hospitalization (including imaging, microbiology, ancillary and laboratory) are listed below for reference.    Significant Diagnostic Studies: US Venous Img Lower Unilateral Right  05/02/2012  *RADIOLOGY REPORT*  Clinical Data: Right lower extremity swelling and redness.  History of diabetes.  RIGHT LOWER EXTREMITY VENOUS DUPLEX ULTRASOUND  Technique:  Gray-scale sonography with graded  compression, as well as color Doppler and duplex ultrasound were performed to evaluate the deep venous system of the lower extremity from the level of the common femoral vein through the popliteal and proximal calf veins. Spectral Doppler was utilized to evaluate flow at rest and with distal augmentation maneuvers.  Comparison:  06/19/2009  Findings:  Normal compressibility of the common femoral, superficial femoral, and popliteal veins is demonstrated, as well as the visualized proximal calf veins.  No filling defects to suggest DVT on grayscale or color Doppler imaging.  Doppler waveforms show normal direction of venous flow, normal respiratory phasicity and response to  augmentation. Soft tissue edema is noted in the right calf area.  IMPRESSION: No evidence of lower extremity deep vein thrombosis.   Original Report Authenticated By: Harrel Lemon, M.D.     Microbiology: No results found for this or any previous visit (from the past 240 hour(s)).   Labs: Basic Metabolic Panel:  Lab 05/02/12 1610 05/01/12 0114  NA 140 132*  K 4.0 3.1*  CL 105 93*  CO2 26 25  GLUCOSE 142* 241*  BUN 11 14  CREATININE 0.86 0.80  CALCIUM 8.7 9.6  MG -- 1.5  PHOS -- --   Liver Function Tests:  Lab 05/02/12 0513 05/01/12 0114  AST 26 39*  ALT 48* 72*  ALKPHOS 53 54  BILITOT 0.3 0.4  PROT 6.5 7.4  ALBUMIN 2.7* 3.4*   No results found for this basename: LIPASE:5,AMYLASE:5 in the last 168 hours No results found for this basename: AMMONIA:5 in the last 168 hours CBC:  Lab 05/02/12 0513 05/01/12 0114  WBC 8.6 16.8*  NEUTROABS -- 15.3*  HGB 11.7* 13.5  HCT 35.4* 39.9  MCV 87.0 84.9  PLT 262 319   Cardiac Enzymes: No results found for this basename: CKTOTAL:5,CKMB:5,CKMBINDEX:5,TROPONINI:5 in the last 168 hours BNP: BNP (last 3 results) No results found for this basename: PROBNP:3 in the last 8760 hours CBG:  Lab 05/04/12 1113 05/04/12 0727 05/03/12 2044 05/03/12 1639 05/03/12 1125  GLUCAP  158* 124* 140* 120* 126*    Time coordinating discharge: greater than 30 minutes  Signed:  Randle Shatzer  [facility] 05/04/2012, 2:07 PM

## 2012-06-06 ENCOUNTER — Other Ambulatory Visit: Payer: Self-pay

## 2012-06-06 MED ORDER — PANTOPRAZOLE SODIUM 40 MG PO TBEC: 40.0000 mg | DELAYED_RELEASE_TABLET | Freq: Every day | ORAL | Status: DC

## 2012-06-06 NOTE — Telephone Encounter (Signed)
Patient overdue for OV with [facility]. RX provided for Protonix.

## 2012-06-07 NOTE — Telephone Encounter (Signed)
Mailed Pt letter to call and make follow up appointment.

## 2013-01-11 ENCOUNTER — Telehealth: Payer: Self-pay | Admitting: Gastroenterology

## 2013-01-11 NOTE — Telephone Encounter (Signed)
Routing to Refill box 

## 2013-01-11 NOTE — Telephone Encounter (Signed)
Patient is asking for a refill on her Pantoprazole 40 mg called to [facility] in [place]

## 2013-01-12 MED ORDER — PANTOPRAZOLE SODIUM 40 MG PO TBEC: 40.0000 mg | DELAYED_RELEASE_TABLET | Freq: Every day | ORAL | Status: DC

## 2013-01-12 NOTE — Telephone Encounter (Signed)
RF X 3 given. Patient needs OV within next 3 months.

## 2013-01-12 NOTE — Addendum Note (Signed)
Addended by: Tiffany Kocher on: 01/12/2013 01:22 PM   Modules accepted: Orders

## 2013-01-16 NOTE — Telephone Encounter (Signed)
LMOM that OV appt needed.

## 2013-01-19 NOTE — Telephone Encounter (Signed)
Reminder in epic °

## 2013-05-01 ENCOUNTER — Other Ambulatory Visit: Payer: Self-pay

## 2013-05-01 NOTE — Telephone Encounter (Signed)
She has an a appointment on Oct 23 @ 10:30 with [facility]

## 2013-05-02 NOTE — Telephone Encounter (Signed)
As per 12/2012 note. Patient needs OV.

## 2013-05-18 ENCOUNTER — Encounter (INDEPENDENT_AMBULATORY_CARE_PROVIDER_SITE_OTHER): Payer: Self-pay

## 2013-05-18 ENCOUNTER — Encounter: Payer: Self-pay | Admitting: Gastroenterology

## 2013-05-18 ENCOUNTER — Ambulatory Visit (INDEPENDENT_AMBULATORY_CARE_PROVIDER_SITE_OTHER): Payer: BC Managed Care – PPO | Admitting: Gastroenterology

## 2013-05-18 VITALS — BP 132/82 | HR 91 | Temp 97.6°F | Wt 251.6 lb

## 2013-05-18 DIAGNOSIS — K7689 Other specified diseases of liver: Secondary | ICD-10-CM

## 2013-05-18 DIAGNOSIS — K219 Gastro-esophageal reflux disease without esophagitis: Secondary | ICD-10-CM

## 2013-05-18 DIAGNOSIS — K760 Fatty (change of) liver, not elsewhere classified: Secondary | ICD-10-CM

## 2013-05-18 DIAGNOSIS — K649 Unspecified hemorrhoids: Secondary | ICD-10-CM

## 2013-05-18 MED ORDER — PANTOPRAZOLE SODIUM 40 MG PO TBEC: 40.0000 mg | DELAYED_RELEASE_TABLET | Freq: Every day | ORAL | Status: DC

## 2013-05-18 MED ORDER — HYDROCORTISONE 2.5 % RE CREA: TOPICAL_CREAM | Freq: Two times a day (BID) | RECTAL | Status: DC

## 2013-05-18 NOTE — Progress Notes (Signed)
Referring Provider: Corrie Mckusick, MD Primary Care Physician:  Colette Ribas, MD Primary GI: Dr. Darrick Penna   Chief Complaint  Patient presents with  . Medication Refill    HPI:   Shelby Morgan presents today in follow-up with a history of NAFLD, NSAID-induced gastritis, dysphagia. Last TCS/EGD in Aug 2012 by Dr. Darrick Penna. Dilation of esophageal stricture at that time. Ran out of Protonix a few weeks ago. Can really tell a difference without it. Needs more. No dysphagia. No abdominal pain, constipation, diarrhea. Scant hematochezia with intermittent constipation. Rare. Has treadmill, walking. Cutting out bread and carbs. Trying to stick with veggies and protein.   Past Medical History  Diagnosis Date  . Diabetes mellitus   . Hypertension   . Bell's palsy 01/08/2011  . Hepatomegaly   . Chronic back pain   . Melanoma 2009    Dr Margo Aye, Stage 2, required only surgery  . Anxiety   . Depression   . NAFLD (nonalcoholic fatty liver disease)     Past Surgical History  Procedure Laterality Date  . Cesarean section    . Leg surgery      plates/screws/hx fx-right leg  . Melanoma excision      right knee  . Colonoscopy  03/09/2011    Dr Darrick Penna diverticulosis, internal and external hemorrhoids  . Esophagogastroduodenoscopy  03/09/2011    Esophageal stricture dilated to 16 MM savory, NSAID- induced gastritis and duodenitis  . Savory dilation  03/09/2011    Current Outpatient Prescriptions  Medication Sig Dispense Refill  . fish oil-omega-3 fatty acids 1000 MG capsule Take 1 g by mouth daily.        . hydrochlorothiazide 25 MG tablet Take 25 mg by mouth daily.        . metFORMIN (GLUCOPHAGE-XR) 500 MG 24 hr tablet Take 500 mg by mouth 2 (two) times daily.      . Multiple Vitamin (MULTIVITAMIN) capsule Take 1 capsule by mouth daily.        . naproxen sodium (ALEVE) 220 MG tablet Take 220 mg by mouth 2 (two) times daily as needed. Back pain.      . pantoprazole (PROTONIX) 40 MG  tablet Take 1 tablet (40 mg total) by mouth daily.  30 tablet  11  . hydrocortisone (ANUSOL-HC) 2.5 % rectal cream Place rectally 2 (two) times daily.  30 g  1   No current facility-administered medications for this visit.    Allergies as of 05/18/2013 - Review Complete 05/18/2013  Allergen Reaction Noted  . Codeine Nausea And Vomiting 01/08/2011    Family History  Problem Relation Age of Onset  . Anesthesia problems Neg Hx   . Hypotension Neg Hx   . Malignant hyperthermia Neg Hx   . Pseudochol deficiency Neg Hx   . Colon cancer Neg Hx     History   Social History  . Marital Status: Married    Spouse Name: N/A    Number of Children: 1  . Years of Education: N/A   Occupational History  . [employer]     part time   Social History Main Topics  . Smoking status: Former Smoker -- 0.50 packs/day for 11 years    Types: Cigarettes    Quit date: 03/03/1993  . Smokeless tobacco: None  . Alcohol Use: No     Comment: wine 2-3 glasses per weekend  . Drug Use: No  . Sexual Activity: Yes    Birth Control/ Protection: Post-menopausal  Other Topics Concern  . None   Social History Narrative  . None    Review of Systems: Negative unless mentioned in HPI.   Physical Exam: BP 132/82  Pulse 91  Temp(Src) 97.6 F (36.4 C) (Oral)  Wt 251 lb 9.6 oz (114.125 kg)  BMI 46.01 kg/m2 General:   Alert and oriented. No distress noted. Pleasant and cooperative.  Head:  Normocephalic and atraumatic. Eyes:  Conjuctiva clear without scleral icterus. Mouth:  Oral mucosa pink and moist. Good dentition. No lesions. Neck:  Supple, without mass or thyromegaly. Heart:  S1, S2 present without murmurs, rubs, or gallops. Regular rate and rhythm. Abdomen:  +BS, soft, largely obese, non-tender and non-distended. No rebound or guarding. Difficult to appreciate any HSM due to large body habitus.  Msk:  Symmetrical without gross deformities. Normal posture. Extremities:  Without  edema. Neurologic:  Alert and  oriented x4;  grossly normal neurologically. Skin:  Intact without significant lesions or rashes. Cervical Nodes:  No significant cervical adenopathy. Psych:  Alert and cooperative. Normal mood and affect.

## 2013-05-18 NOTE — Assessment & Plan Note (Signed)
Encouraged continued diet and lifestyle changes. Retrieve most recent LFTs from Dr. Lamar Blinks office.

## 2013-05-18 NOTE — Progress Notes (Signed)
Received HFP from PCP dated Aug 2014. All normal.  Will be abstracted.

## 2013-05-18 NOTE — Assessment & Plan Note (Signed)
Resolved dysphagia after dilation of esophageal stricture in Aug 2012. Doing well without complicating features on Protonix once daily. Refill X 1 year. Return in 1 year.

## 2013-05-18 NOTE — Assessment & Plan Note (Signed)
Known internal hemorrhoids, last colonoscopy 2012. Scant low-volume hematochezia in the setting of rare constipation. Discussed [facility] banding. Patient would like to think about this first. Brochure and education provided. Anusol cream sent to pharmacy in interim. Patient will call if she decides to proceed. Continue high fiber diet, avoid straining.

## 2013-05-18 NOTE — Progress Notes (Signed)
cc'd to pcp 

## 2013-05-18 NOTE — Patient Instructions (Signed)
I have sent Anusol cream to your pharmacy to use if you see any rectal bleeding. You can use this twice a day for 5 days. Review the handout on the banding procedure; I think you would do well with this.   We will see you back in 1 year! Please call if you would like to proceed with the banding procedure.

## 2013-05-24 LAB — COMPREHENSIVE METABOLIC PANEL
AST: 22 U/L
Albumin: 3.8
Alkaline Phosphatase: 55 U/L

## 2013-10-17 ENCOUNTER — Other Ambulatory Visit (HOSPITAL_COMMUNITY): Payer: Self-pay | Admitting: Family Medicine

## 2013-10-17 DIAGNOSIS — Z139 Encounter for screening, unspecified: Secondary | ICD-10-CM

## 2013-10-23 ENCOUNTER — Ambulatory Visit (HOSPITAL_COMMUNITY)
Admission: RE | Admit: 2013-10-23 | Discharge: 2013-10-23 | Disposition: A | Discharge status: Home / Self Care | Payer: BC Managed Care – PPO | Source: Ambulatory Visit | Attending: Family Medicine | Admitting: Family Medicine

## 2013-10-23 DIAGNOSIS — Z139 Encounter for screening, unspecified: Secondary | ICD-10-CM

## 2013-10-23 DIAGNOSIS — Z1231 Encounter for screening mammogram for malignant neoplasm of breast: Secondary | ICD-10-CM | POA: Insufficient documentation

## 2014-04-05 ENCOUNTER — Encounter: Payer: Self-pay | Admitting: Gastroenterology

## 2014-05-31 ENCOUNTER — Telehealth: Payer: Self-pay | Admitting: Gastroenterology

## 2014-05-31 NOTE — Telephone Encounter (Signed)
LMOM for pt that I would forward the request to the refill box.

## 2014-05-31 NOTE — Telephone Encounter (Signed)
Patient LMOM that she had received a letter from [place] that it was time to make a follow up visit and she was out of her acid reflux pills and her pharmacy should be calling us about that. I called patient back and LMOM that her OV would be on 12/17 at 0830 with SF and that I forwarded her message to the nurse's VM regarding her prescription.

## 2014-06-01 MED ORDER — PANTOPRAZOLE SODIUM 40 MG PO TBEC: 40.0000 mg | DELAYED_RELEASE_TABLET | Freq: Every day | ORAL | Status: DC

## 2014-06-01 NOTE — Addendum Note (Signed)
Addended by: Mahala Menghini on: 06/01/2014 11:15 AM   Modules accepted: Orders

## 2014-06-05 ENCOUNTER — Other Ambulatory Visit: Payer: Self-pay

## 2014-06-05 MED ORDER — PANTOPRAZOLE SODIUM 40 MG PO TBEC: 40.0000 mg | DELAYED_RELEASE_TABLET | Freq: Every day | ORAL | Status: DC

## 2014-07-12 ENCOUNTER — Ambulatory Visit: Payer: BC Managed Care – PPO | Admitting: Gastroenterology

## 2014-07-16 ENCOUNTER — Encounter: Payer: Self-pay | Admitting: Gastroenterology

## 2015-02-15 NOTE — Progress Notes (Signed)
REVIEWED.  

## 2015-07-11 ENCOUNTER — Other Ambulatory Visit: Payer: Self-pay | Admitting: Gastroenterology

## 2016-06-02 DIAGNOSIS — M25571 Pain in right ankle and joints of right foot: Secondary | ICD-10-CM | POA: Diagnosis not present

## 2016-06-02 DIAGNOSIS — Z1389 Encounter for screening for other disorder: Secondary | ICD-10-CM | POA: Diagnosis not present

## 2016-06-02 DIAGNOSIS — M79671 Pain in right foot: Secondary | ICD-10-CM | POA: Diagnosis not present

## 2016-06-02 DIAGNOSIS — B353 Tinea pedis: Secondary | ICD-10-CM | POA: Diagnosis not present

## 2016-06-02 DIAGNOSIS — Z6841 Body Mass Index (BMI) 40.0 and over, adult: Secondary | ICD-10-CM | POA: Diagnosis not present

## 2016-06-03 ENCOUNTER — Ambulatory Visit (HOSPITAL_COMMUNITY)
Admission: RE | Admit: 2016-06-03 | Discharge: 2016-06-03 | Disposition: A | Discharge status: Home / Self Care | Payer: BLUE CROSS/BLUE SHIELD | Source: Ambulatory Visit | Attending: Registered Nurse | Admitting: Registered Nurse

## 2016-06-03 ENCOUNTER — Other Ambulatory Visit (HOSPITAL_COMMUNITY): Payer: Self-pay | Admitting: Registered Nurse

## 2016-06-03 DIAGNOSIS — M19071 Primary osteoarthritis, right ankle and foot: Secondary | ICD-10-CM | POA: Diagnosis not present

## 2016-06-03 DIAGNOSIS — M25571 Pain in right ankle and joints of right foot: Secondary | ICD-10-CM

## 2016-06-05 DIAGNOSIS — Z681 Body mass index (BMI) 19 or less, adult: Secondary | ICD-10-CM | POA: Diagnosis not present

## 2016-06-05 DIAGNOSIS — C4371 Malignant melanoma of right lower limb, including hip: Secondary | ICD-10-CM | POA: Diagnosis not present

## 2016-06-05 DIAGNOSIS — M722 Plantar fascial fibromatosis: Secondary | ICD-10-CM | POA: Diagnosis not present

## 2016-06-05 DIAGNOSIS — L84 Corns and callosities: Secondary | ICD-10-CM | POA: Diagnosis not present

## 2016-06-05 DIAGNOSIS — Z23 Encounter for immunization: Secondary | ICD-10-CM | POA: Diagnosis not present

## 2016-06-05 DIAGNOSIS — Z1389 Encounter for screening for other disorder: Secondary | ICD-10-CM | POA: Diagnosis not present

## 2016-06-05 DIAGNOSIS — I10 Essential (primary) hypertension: Secondary | ICD-10-CM | POA: Diagnosis not present

## 2016-06-05 DIAGNOSIS — Z0000 Encounter for general adult medical examination without abnormal findings: Secondary | ICD-10-CM | POA: Diagnosis not present

## 2016-06-05 DIAGNOSIS — E119 Type 2 diabetes mellitus without complications: Secondary | ICD-10-CM | POA: Diagnosis not present

## 2016-06-16 DIAGNOSIS — Z6841 Body Mass Index (BMI) 40.0 and over, adult: Secondary | ICD-10-CM | POA: Diagnosis not present

## 2016-06-16 DIAGNOSIS — E1165 Type 2 diabetes mellitus with hyperglycemia: Secondary | ICD-10-CM | POA: Diagnosis not present

## 2016-06-16 DIAGNOSIS — B353 Tinea pedis: Secondary | ICD-10-CM | POA: Diagnosis not present

## 2016-06-16 DIAGNOSIS — L03115 Cellulitis of right lower limb: Secondary | ICD-10-CM | POA: Diagnosis not present

## 2016-07-18 ENCOUNTER — Other Ambulatory Visit: Payer: Self-pay | Admitting: Gastroenterology

## 2016-07-24 ENCOUNTER — Encounter: Payer: Self-pay | Admitting: Orthopedic Surgery

## 2016-08-07 DIAGNOSIS — Z961 Presence of intraocular lens: Secondary | ICD-10-CM | POA: Diagnosis not present

## 2016-08-07 DIAGNOSIS — H2511 Age-related nuclear cataract, right eye: Secondary | ICD-10-CM | POA: Diagnosis not present

## 2016-08-07 DIAGNOSIS — H02839 Dermatochalasis of unspecified eye, unspecified eyelid: Secondary | ICD-10-CM | POA: Diagnosis not present

## 2016-08-07 DIAGNOSIS — H35371 Puckering of macula, right eye: Secondary | ICD-10-CM | POA: Diagnosis not present

## 2016-08-07 DIAGNOSIS — H18413 Arcus senilis, bilateral: Secondary | ICD-10-CM | POA: Diagnosis not present

## 2016-08-19 ENCOUNTER — Ambulatory Visit: Payer: BLUE CROSS/BLUE SHIELD | Admitting: Gastroenterology

## 2016-08-27 DIAGNOSIS — D473 Essential (hemorrhagic) thrombocythemia: Secondary | ICD-10-CM | POA: Diagnosis not present

## 2016-08-28 DIAGNOSIS — H25811 Combined forms of age-related cataract, right eye: Secondary | ICD-10-CM | POA: Diagnosis not present

## 2016-08-28 DIAGNOSIS — H2511 Age-related nuclear cataract, right eye: Secondary | ICD-10-CM | POA: Diagnosis not present

## 2016-09-02 ENCOUNTER — Ambulatory Visit: Payer: BLUE CROSS/BLUE SHIELD | Admitting: Gastroenterology

## 2016-09-03 ENCOUNTER — Other Ambulatory Visit (HOSPITAL_COMMUNITY): Payer: Self-pay | Admitting: Registered Nurse

## 2016-09-03 DIAGNOSIS — Z1231 Encounter for screening mammogram for malignant neoplasm of breast: Secondary | ICD-10-CM

## 2016-09-04 DIAGNOSIS — H5213 Myopia, bilateral: Secondary | ICD-10-CM | POA: Diagnosis not present

## 2016-09-07 ENCOUNTER — Ambulatory Visit (HOSPITAL_COMMUNITY): Payer: BLUE CROSS/BLUE SHIELD

## 2016-09-10 ENCOUNTER — Other Ambulatory Visit (HOSPITAL_COMMUNITY): Payer: Self-pay | Admitting: Internal Medicine

## 2016-09-10 DIAGNOSIS — Z1231 Encounter for screening mammogram for malignant neoplasm of breast: Secondary | ICD-10-CM

## 2016-09-15 DIAGNOSIS — H35371 Puckering of macula, right eye: Secondary | ICD-10-CM | POA: Diagnosis not present

## 2016-09-17 ENCOUNTER — Ambulatory Visit (HOSPITAL_COMMUNITY)
Admission: RE | Admit: 2016-09-17 | Discharge: 2016-09-17 | Disposition: A | Discharge status: Home / Self Care | Payer: BLUE CROSS/BLUE SHIELD | Source: Ambulatory Visit | Attending: Internal Medicine | Admitting: Internal Medicine

## 2016-09-17 DIAGNOSIS — Z1231 Encounter for screening mammogram for malignant neoplasm of breast: Secondary | ICD-10-CM

## 2016-09-21 DIAGNOSIS — I10 Essential (primary) hypertension: Secondary | ICD-10-CM | POA: Diagnosis not present

## 2016-09-21 DIAGNOSIS — E782 Mixed hyperlipidemia: Secondary | ICD-10-CM | POA: Diagnosis not present

## 2016-09-21 DIAGNOSIS — Z6841 Body Mass Index (BMI) 40.0 and over, adult: Secondary | ICD-10-CM | POA: Diagnosis not present

## 2016-09-21 DIAGNOSIS — E119 Type 2 diabetes mellitus without complications: Secondary | ICD-10-CM | POA: Diagnosis not present

## 2016-09-21 DIAGNOSIS — N644 Mastodynia: Secondary | ICD-10-CM | POA: Diagnosis not present

## 2016-09-25 ENCOUNTER — Other Ambulatory Visit (HOSPITAL_COMMUNITY): Payer: Self-pay | Admitting: Registered Nurse

## 2016-09-25 DIAGNOSIS — N644 Mastodynia: Secondary | ICD-10-CM

## 2016-09-29 DIAGNOSIS — H35373 Puckering of macula, bilateral: Secondary | ICD-10-CM | POA: Diagnosis not present

## 2016-09-29 DIAGNOSIS — H3581 Retinal edema: Secondary | ICD-10-CM | POA: Diagnosis not present

## 2016-09-29 DIAGNOSIS — H43393 Other vitreous opacities, bilateral: Secondary | ICD-10-CM | POA: Diagnosis not present

## 2016-09-29 DIAGNOSIS — H35432 Paving stone degeneration of retina, left eye: Secondary | ICD-10-CM | POA: Diagnosis not present

## 2016-10-12 DIAGNOSIS — H3581 Retinal edema: Secondary | ICD-10-CM | POA: Diagnosis not present

## 2016-10-12 DIAGNOSIS — H35371 Puckering of macula, right eye: Secondary | ICD-10-CM | POA: Diagnosis not present

## 2016-10-13 ENCOUNTER — Ambulatory Visit (HOSPITAL_COMMUNITY)
Admission: RE | Admit: 2016-10-13 | Discharge: 2016-10-13 | Disposition: A | Discharge status: Home / Self Care | Payer: BLUE CROSS/BLUE SHIELD | Source: Ambulatory Visit | Attending: Registered Nurse | Admitting: Registered Nurse

## 2016-10-13 DIAGNOSIS — N6489 Other specified disorders of breast: Secondary | ICD-10-CM | POA: Diagnosis not present

## 2016-10-13 DIAGNOSIS — R928 Other abnormal and inconclusive findings on diagnostic imaging of breast: Secondary | ICD-10-CM | POA: Diagnosis not present

## 2016-10-13 DIAGNOSIS — N644 Mastodynia: Secondary | ICD-10-CM

## 2016-10-21 DIAGNOSIS — H35371 Puckering of macula, right eye: Secondary | ICD-10-CM | POA: Diagnosis not present

## 2016-10-21 DIAGNOSIS — H3581 Retinal edema: Secondary | ICD-10-CM | POA: Diagnosis not present

## 2016-10-21 DIAGNOSIS — H35373 Puckering of macula, bilateral: Secondary | ICD-10-CM | POA: Diagnosis not present

## 2016-11-11 DIAGNOSIS — E113211 Type 2 diabetes mellitus with mild nonproliferative diabetic retinopathy with macular edema, right eye: Secondary | ICD-10-CM | POA: Diagnosis not present

## 2016-11-11 DIAGNOSIS — H35372 Puckering of macula, left eye: Secondary | ICD-10-CM | POA: Diagnosis not present

## 2016-12-08 DIAGNOSIS — E113211 Type 2 diabetes mellitus with mild nonproliferative diabetic retinopathy with macular edema, right eye: Secondary | ICD-10-CM | POA: Diagnosis not present

## 2016-12-29 DIAGNOSIS — H43812 Vitreous degeneration, left eye: Secondary | ICD-10-CM | POA: Diagnosis not present

## 2016-12-29 DIAGNOSIS — E113213 Type 2 diabetes mellitus with mild nonproliferative diabetic retinopathy with macular edema, bilateral: Secondary | ICD-10-CM | POA: Diagnosis not present

## 2016-12-29 DIAGNOSIS — H35372 Puckering of macula, left eye: Secondary | ICD-10-CM | POA: Diagnosis not present

## 2016-12-29 DIAGNOSIS — H43392 Other vitreous opacities, left eye: Secondary | ICD-10-CM | POA: Diagnosis not present

## 2017-03-19 DIAGNOSIS — E113291 Type 2 diabetes mellitus with mild nonproliferative diabetic retinopathy without macular edema, right eye: Secondary | ICD-10-CM | POA: Diagnosis not present

## 2017-03-19 DIAGNOSIS — E113212 Type 2 diabetes mellitus with mild nonproliferative diabetic retinopathy with macular edema, left eye: Secondary | ICD-10-CM | POA: Diagnosis not present

## 2017-03-19 DIAGNOSIS — H35432 Paving stone degeneration of retina, left eye: Secondary | ICD-10-CM | POA: Diagnosis not present

## 2017-03-19 DIAGNOSIS — H35372 Puckering of macula, left eye: Secondary | ICD-10-CM | POA: Diagnosis not present

## 2017-05-26 DIAGNOSIS — I10 Essential (primary) hypertension: Secondary | ICD-10-CM | POA: Diagnosis not present

## 2017-05-26 DIAGNOSIS — E1165 Type 2 diabetes mellitus with hyperglycemia: Secondary | ICD-10-CM | POA: Diagnosis not present

## 2017-05-26 DIAGNOSIS — E1140 Type 2 diabetes mellitus with diabetic neuropathy, unspecified: Secondary | ICD-10-CM | POA: Diagnosis not present

## 2017-05-26 DIAGNOSIS — Z6841 Body Mass Index (BMI) 40.0 and over, adult: Secondary | ICD-10-CM | POA: Diagnosis not present

## 2017-05-26 DIAGNOSIS — Z23 Encounter for immunization: Secondary | ICD-10-CM | POA: Diagnosis not present

## 2017-05-26 DIAGNOSIS — E782 Mixed hyperlipidemia: Secondary | ICD-10-CM | POA: Diagnosis not present

## 2017-05-26 DIAGNOSIS — Z1389 Encounter for screening for other disorder: Secondary | ICD-10-CM | POA: Diagnosis not present

## 2017-06-22 DIAGNOSIS — E113211 Type 2 diabetes mellitus with mild nonproliferative diabetic retinopathy with macular edema, right eye: Secondary | ICD-10-CM | POA: Diagnosis not present

## 2017-06-22 DIAGNOSIS — E113292 Type 2 diabetes mellitus with mild nonproliferative diabetic retinopathy without macular edema, left eye: Secondary | ICD-10-CM | POA: Diagnosis not present

## 2017-06-22 DIAGNOSIS — H43812 Vitreous degeneration, left eye: Secondary | ICD-10-CM | POA: Diagnosis not present

## 2017-06-22 DIAGNOSIS — H43392 Other vitreous opacities, left eye: Secondary | ICD-10-CM | POA: Diagnosis not present

## 2017-07-26 DIAGNOSIS — H43812 Vitreous degeneration, left eye: Secondary | ICD-10-CM | POA: Diagnosis not present

## 2017-07-26 DIAGNOSIS — E113213 Type 2 diabetes mellitus with mild nonproliferative diabetic retinopathy with macular edema, bilateral: Secondary | ICD-10-CM | POA: Diagnosis not present

## 2017-07-26 DIAGNOSIS — H35432 Paving stone degeneration of retina, left eye: Secondary | ICD-10-CM | POA: Diagnosis not present

## 2017-07-26 DIAGNOSIS — H35372 Puckering of macula, left eye: Secondary | ICD-10-CM | POA: Diagnosis not present

## 2017-08-24 DIAGNOSIS — H35372 Puckering of macula, left eye: Secondary | ICD-10-CM | POA: Diagnosis not present

## 2017-08-24 DIAGNOSIS — E113511 Type 2 diabetes mellitus with proliferative diabetic retinopathy with macular edema, right eye: Secondary | ICD-10-CM | POA: Diagnosis not present

## 2017-08-24 DIAGNOSIS — E113312 Type 2 diabetes mellitus with moderate nonproliferative diabetic retinopathy with macular edema, left eye: Secondary | ICD-10-CM | POA: Diagnosis not present

## 2017-08-24 DIAGNOSIS — H43812 Vitreous degeneration, left eye: Secondary | ICD-10-CM | POA: Diagnosis not present

## 2017-09-21 DIAGNOSIS — H35372 Puckering of macula, left eye: Secondary | ICD-10-CM | POA: Diagnosis not present

## 2017-09-21 DIAGNOSIS — E113513 Type 2 diabetes mellitus with proliferative diabetic retinopathy with macular edema, bilateral: Secondary | ICD-10-CM | POA: Diagnosis not present

## 2017-09-21 DIAGNOSIS — H4053X4 Glaucoma secondary to other eye disorders, bilateral, indeterminate stage: Secondary | ICD-10-CM | POA: Diagnosis not present

## 2017-09-21 DIAGNOSIS — H211X3 Other vascular disorders of iris and ciliary body, bilateral: Secondary | ICD-10-CM | POA: Diagnosis not present

## 2017-09-23 DIAGNOSIS — E113391 Type 2 diabetes mellitus with moderate nonproliferative diabetic retinopathy without macular edema, right eye: Secondary | ICD-10-CM | POA: Diagnosis not present

## 2017-09-23 DIAGNOSIS — H4052X4 Glaucoma secondary to other eye disorders, left eye, indeterminate stage: Secondary | ICD-10-CM | POA: Diagnosis not present

## 2017-09-23 DIAGNOSIS — E113511 Type 2 diabetes mellitus with proliferative diabetic retinopathy with macular edema, right eye: Secondary | ICD-10-CM | POA: Diagnosis not present

## 2017-09-23 DIAGNOSIS — H4051X3 Glaucoma secondary to other eye disorders, right eye, severe stage: Secondary | ICD-10-CM | POA: Diagnosis not present

## 2017-09-23 DIAGNOSIS — H3582 Retinal ischemia: Secondary | ICD-10-CM | POA: Diagnosis not present

## 2017-10-04 DIAGNOSIS — E113592 Type 2 diabetes mellitus with proliferative diabetic retinopathy without macular edema, left eye: Secondary | ICD-10-CM | POA: Diagnosis not present

## 2017-10-04 DIAGNOSIS — H4052X3 Glaucoma secondary to other eye disorders, left eye, severe stage: Secondary | ICD-10-CM | POA: Diagnosis not present

## 2017-10-04 DIAGNOSIS — H4312 Vitreous hemorrhage, left eye: Secondary | ICD-10-CM | POA: Diagnosis not present

## 2017-10-04 DIAGNOSIS — H4052X4 Glaucoma secondary to other eye disorders, left eye, indeterminate stage: Secondary | ICD-10-CM | POA: Diagnosis not present

## 2017-10-05 DIAGNOSIS — H4052X4 Glaucoma secondary to other eye disorders, left eye, indeterminate stage: Secondary | ICD-10-CM | POA: Diagnosis not present

## 2017-10-05 DIAGNOSIS — E113592 Type 2 diabetes mellitus with proliferative diabetic retinopathy without macular edema, left eye: Secondary | ICD-10-CM | POA: Diagnosis not present

## 2017-11-23 DIAGNOSIS — E113513 Type 2 diabetes mellitus with proliferative diabetic retinopathy with macular edema, bilateral: Secondary | ICD-10-CM | POA: Diagnosis not present

## 2017-12-08 DIAGNOSIS — E113511 Type 2 diabetes mellitus with proliferative diabetic retinopathy with macular edema, right eye: Secondary | ICD-10-CM | POA: Diagnosis not present

## 2017-12-29 DIAGNOSIS — H3582 Retinal ischemia: Secondary | ICD-10-CM | POA: Diagnosis not present

## 2017-12-29 DIAGNOSIS — E113513 Type 2 diabetes mellitus with proliferative diabetic retinopathy with macular edema, bilateral: Secondary | ICD-10-CM | POA: Diagnosis not present

## 2017-12-29 DIAGNOSIS — E113511 Type 2 diabetes mellitus with proliferative diabetic retinopathy with macular edema, right eye: Secondary | ICD-10-CM | POA: Diagnosis not present

## 2018-01-12 DIAGNOSIS — H4389 Other disorders of vitreous body: Secondary | ICD-10-CM | POA: Diagnosis not present

## 2018-01-12 DIAGNOSIS — E113513 Type 2 diabetes mellitus with proliferative diabetic retinopathy with macular edema, bilateral: Secondary | ICD-10-CM | POA: Diagnosis not present

## 2018-01-19 DIAGNOSIS — E113512 Type 2 diabetes mellitus with proliferative diabetic retinopathy with macular edema, left eye: Secondary | ICD-10-CM | POA: Diagnosis not present

## 2018-01-19 DIAGNOSIS — H3582 Retinal ischemia: Secondary | ICD-10-CM | POA: Diagnosis not present

## 2018-01-19 DIAGNOSIS — H4053X4 Glaucoma secondary to other eye disorders, bilateral, indeterminate stage: Secondary | ICD-10-CM | POA: Diagnosis not present

## 2018-01-19 DIAGNOSIS — E113513 Type 2 diabetes mellitus with proliferative diabetic retinopathy with macular edema, bilateral: Secondary | ICD-10-CM | POA: Diagnosis not present

## 2018-02-08 DIAGNOSIS — H4053X4 Glaucoma secondary to other eye disorders, bilateral, indeterminate stage: Secondary | ICD-10-CM | POA: Diagnosis not present

## 2018-02-08 DIAGNOSIS — E113513 Type 2 diabetes mellitus with proliferative diabetic retinopathy with macular edema, bilateral: Secondary | ICD-10-CM | POA: Diagnosis not present

## 2018-02-20 DIAGNOSIS — W57XXXA Bitten or stung by nonvenomous insect and other nonvenomous arthropods, initial encounter: Secondary | ICD-10-CM | POA: Diagnosis not present

## 2018-02-20 DIAGNOSIS — L089 Local infection of the skin and subcutaneous tissue, unspecified: Secondary | ICD-10-CM | POA: Diagnosis not present

## 2018-02-20 DIAGNOSIS — S90861A Insect bite (nonvenomous), right foot, initial encounter: Secondary | ICD-10-CM | POA: Diagnosis not present

## 2018-02-20 DIAGNOSIS — A46 Erysipelas: Secondary | ICD-10-CM | POA: Diagnosis not present

## 2018-03-09 ENCOUNTER — Telehealth: Payer: Self-pay | Admitting: Gastroenterology

## 2018-03-09 NOTE — Telephone Encounter (Signed)
Forwarding to refill box and I have changed to correct pharmacy.

## 2018-03-09 NOTE — Telephone Encounter (Signed)
[phone]  Patient said her pharmacy was supposed to send [place] a refill request for her medication and it needs to go to [facility] on [address]. It's  pantoprazole

## 2018-03-10 NOTE — Telephone Encounter (Signed)
Noted, filled per Rx Refill box.

## 2018-03-25 DIAGNOSIS — Z1389 Encounter for screening for other disorder: Secondary | ICD-10-CM | POA: Diagnosis not present

## 2018-03-25 DIAGNOSIS — E1140 Type 2 diabetes mellitus with diabetic neuropathy, unspecified: Secondary | ICD-10-CM | POA: Diagnosis not present

## 2018-03-25 DIAGNOSIS — I10 Essential (primary) hypertension: Secondary | ICD-10-CM | POA: Diagnosis not present

## 2018-03-25 DIAGNOSIS — E782 Mixed hyperlipidemia: Secondary | ICD-10-CM | POA: Diagnosis not present

## 2018-03-25 DIAGNOSIS — Z6841 Body Mass Index (BMI) 40.0 and over, adult: Secondary | ICD-10-CM | POA: Diagnosis not present

## 2018-03-29 DIAGNOSIS — E119 Type 2 diabetes mellitus without complications: Secondary | ICD-10-CM | POA: Diagnosis not present

## 2018-03-30 DIAGNOSIS — Z1389 Encounter for screening for other disorder: Secondary | ICD-10-CM | POA: Diagnosis not present

## 2018-03-30 DIAGNOSIS — Z6841 Body Mass Index (BMI) 40.0 and over, adult: Secondary | ICD-10-CM | POA: Diagnosis not present

## 2018-04-12 DIAGNOSIS — H26491 Other secondary cataract, right eye: Secondary | ICD-10-CM | POA: Diagnosis not present

## 2018-04-12 DIAGNOSIS — H35371 Puckering of macula, right eye: Secondary | ICD-10-CM | POA: Diagnosis not present

## 2018-04-12 DIAGNOSIS — E113513 Type 2 diabetes mellitus with proliferative diabetic retinopathy with macular edema, bilateral: Secondary | ICD-10-CM | POA: Diagnosis not present

## 2018-04-12 DIAGNOSIS — H3582 Retinal ischemia: Secondary | ICD-10-CM | POA: Diagnosis not present

## 2018-05-12 DIAGNOSIS — Z23 Encounter for immunization: Secondary | ICD-10-CM | POA: Diagnosis not present

## 2018-05-16 ENCOUNTER — Encounter (HOSPITAL_COMMUNITY): Payer: Self-pay

## 2018-05-16 ENCOUNTER — Other Ambulatory Visit (HOSPITAL_COMMUNITY): Payer: Self-pay | Admitting: Physician Assistant

## 2018-05-16 ENCOUNTER — Ambulatory Visit (HOSPITAL_COMMUNITY)
Admission: RE | Admit: 2018-05-16 | Discharge: 2018-05-16 | Disposition: A | Discharge status: Home / Self Care | Payer: BLUE CROSS/BLUE SHIELD | Source: Ambulatory Visit | Attending: Physician Assistant | Admitting: Physician Assistant

## 2018-05-16 ENCOUNTER — Ambulatory Visit (HOSPITAL_COMMUNITY): Payer: Self-pay

## 2018-05-16 DIAGNOSIS — I10 Essential (primary) hypertension: Secondary | ICD-10-CM | POA: Diagnosis not present

## 2018-05-16 DIAGNOSIS — R600 Localized edema: Secondary | ICD-10-CM | POA: Diagnosis not present

## 2018-05-16 DIAGNOSIS — M25561 Pain in right knee: Secondary | ICD-10-CM

## 2018-05-16 DIAGNOSIS — S8991XA Unspecified injury of right lower leg, initial encounter: Secondary | ICD-10-CM | POA: Diagnosis not present

## 2018-05-16 DIAGNOSIS — M1711 Unilateral primary osteoarthritis, right knee: Secondary | ICD-10-CM | POA: Insufficient documentation

## 2018-05-16 DIAGNOSIS — Z1389 Encounter for screening for other disorder: Secondary | ICD-10-CM | POA: Diagnosis not present

## 2018-05-16 DIAGNOSIS — Z6839 Body mass index (BMI) 39.0-39.9, adult: Secondary | ICD-10-CM | POA: Diagnosis not present

## 2018-05-16 DIAGNOSIS — I872 Venous insufficiency (chronic) (peripheral): Secondary | ICD-10-CM | POA: Diagnosis not present

## 2018-05-24 ENCOUNTER — Ambulatory Visit: Payer: BLUE CROSS/BLUE SHIELD | Admitting: Orthopaedic Surgery

## 2018-06-06 ENCOUNTER — Other Ambulatory Visit: Payer: Self-pay

## 2018-06-06 ENCOUNTER — Observation Stay (HOSPITAL_COMMUNITY)
Admission: EM | Admit: 2018-06-06 | Discharge: 2018-06-07 | Disposition: A | Discharge status: Home / Self Care | Payer: BLUE CROSS/BLUE SHIELD | Attending: Internal Medicine | Admitting: Internal Medicine

## 2018-06-06 ENCOUNTER — Emergency Department (HOSPITAL_COMMUNITY): Payer: BLUE CROSS/BLUE SHIELD

## 2018-06-06 ENCOUNTER — Encounter (HOSPITAL_COMMUNITY): Payer: Self-pay | Admitting: Emergency Medicine

## 2018-06-06 DIAGNOSIS — E669 Obesity, unspecified: Secondary | ICD-10-CM | POA: Diagnosis present

## 2018-06-06 DIAGNOSIS — E119 Type 2 diabetes mellitus without complications: Secondary | ICD-10-CM | POA: Insufficient documentation

## 2018-06-06 DIAGNOSIS — E785 Hyperlipidemia, unspecified: Secondary | ICD-10-CM | POA: Diagnosis not present

## 2018-06-06 DIAGNOSIS — Z79899 Other long term (current) drug therapy: Secondary | ICD-10-CM | POA: Diagnosis not present

## 2018-06-06 DIAGNOSIS — K0889 Other specified disorders of teeth and supporting structures: Secondary | ICD-10-CM

## 2018-06-06 DIAGNOSIS — R079 Chest pain, unspecified: Secondary | ICD-10-CM | POA: Diagnosis not present

## 2018-06-06 DIAGNOSIS — R590 Localized enlarged lymph nodes: Secondary | ICD-10-CM | POA: Diagnosis not present

## 2018-06-06 DIAGNOSIS — Z7982 Long term (current) use of aspirin: Secondary | ICD-10-CM | POA: Diagnosis not present

## 2018-06-06 DIAGNOSIS — Z7984 Long term (current) use of oral hypoglycemic drugs: Secondary | ICD-10-CM | POA: Insufficient documentation

## 2018-06-06 DIAGNOSIS — R0789 Other chest pain: Secondary | ICD-10-CM | POA: Diagnosis not present

## 2018-06-06 DIAGNOSIS — R7989 Other specified abnormal findings of blood chemistry: Secondary | ICD-10-CM | POA: Diagnosis not present

## 2018-06-06 DIAGNOSIS — I10 Essential (primary) hypertension: Secondary | ICD-10-CM | POA: Diagnosis not present

## 2018-06-06 DIAGNOSIS — Z87891 Personal history of nicotine dependence: Secondary | ICD-10-CM | POA: Insufficient documentation

## 2018-06-06 DIAGNOSIS — K219 Gastro-esophageal reflux disease without esophagitis: Secondary | ICD-10-CM | POA: Diagnosis present

## 2018-06-06 DIAGNOSIS — K760 Fatty (change of) liver, not elsewhere classified: Secondary | ICD-10-CM | POA: Diagnosis not present

## 2018-06-06 DIAGNOSIS — E876 Hypokalemia: Secondary | ICD-10-CM | POA: Diagnosis not present

## 2018-06-06 DIAGNOSIS — E1169 Type 2 diabetes mellitus with other specified complication: Secondary | ICD-10-CM | POA: Diagnosis present

## 2018-06-06 DIAGNOSIS — I152 Hypertension secondary to endocrine disorders: Secondary | ICD-10-CM | POA: Insufficient documentation

## 2018-06-06 DIAGNOSIS — E1159 Type 2 diabetes mellitus with other circulatory complications: Secondary | ICD-10-CM | POA: Insufficient documentation

## 2018-06-06 LAB — CBC
HCT: 38 % (ref 36.0–46.0)
Hemoglobin: 11.9 g/dL — ABNORMAL LOW (ref 12.0–15.0)
MCH: 26.8 pg (ref 26.0–34.0)
MCHC: 31.3 g/dL (ref 30.0–36.0)
MCV: 85.6 fL (ref 80.0–100.0)
PLATELETS: 336 10*3/uL (ref 150–400)
RBC: 4.44 MIL/uL (ref 3.87–5.11)
RDW: 13.7 % (ref 11.5–15.5)
WBC: 9.1 10*3/uL (ref 4.0–10.5)
nRBC: 0 % (ref 0.0–0.2)

## 2018-06-06 LAB — BASIC METABOLIC PANEL
Anion gap: 8 (ref 5–15)
BUN: 20 mg/dL (ref 8–23)
CALCIUM: 9.1 mg/dL (ref 8.9–10.3)
CO2: 27 mmol/L (ref 22–32)
Chloride: 102 mmol/L (ref 98–111)
Creatinine, Ser: 0.8 mg/dL (ref 0.44–1.00)
GFR calc Af Amer: >60 mL/min (ref >60)
Glucose, Bld: 102 mg/dL — ABNORMAL HIGH (ref 70–99)
Potassium: 3.4 mmol/L — ABNORMAL LOW (ref 3.5–5.1)
SODIUM: 137 mmol/L (ref 135–145)

## 2018-06-06 LAB — HEPATIC FUNCTION PANEL
ALBUMIN: 4 g/dL (ref 3.5–5.0)
ALK PHOS: 45 U/L (ref 38–126)
ALT: 21 U/L (ref 0–44)
AST: 21 U/L (ref 15–41)
BILIRUBIN TOTAL: 0.9 mg/dL (ref 0.3–1.2)
Bilirubin, Direct: 0.1 mg/dL (ref 0.0–0.2)
Indirect Bilirubin: 0.8 mg/dL (ref 0.3–0.9)
TOTAL PROTEIN: 7.9 g/dL (ref 6.5–8.1)

## 2018-06-06 LAB — TROPONIN I: Troponin I: <0.03 ng/mL (ref <0.03)

## 2018-06-06 LAB — D-DIMER, QUANTITATIVE: D-Dimer, Quant: 1.08 ug/mL-FEU — ABNORMAL HIGH (ref 0.00–0.50)

## 2018-06-06 LAB — GLUCOSE, CAPILLARY: GLUCOSE-CAPILLARY: 115 mg/dL — AB (ref 70–99)

## 2018-06-06 MED ORDER — ALUM & MAG HYDROXIDE-SIMETH 200-200-20 MG/5ML PO SUSP
30.0000 mL | Freq: Once | ORAL | Status: AC
  Administered 2018-06-06: 30 mL via ORAL
  Filled 2018-06-06: qty 30

## 2018-06-06 MED ORDER — POTASSIUM CHLORIDE 20 MEQ/15ML (10%) PO SOLN
40.0000 meq | Freq: Once | ORAL | Status: AC
  Administered 2018-06-06: 40 meq via ORAL
  Filled 2018-06-06: qty 30

## 2018-06-06 MED ORDER — ENOXAPARIN SODIUM 40 MG/0.4ML ~~LOC~~ SOLN
40.0000 mg | SUBCUTANEOUS | Status: DC
  Administered 2018-06-06: 40 mg via SUBCUTANEOUS
  Filled 2018-06-06: qty 0.4

## 2018-06-06 MED ORDER — IOPAMIDOL (ISOVUE-370) INJECTION 76%
100.0000 mL | Freq: Once | INTRAVENOUS | Status: AC | PRN
  Administered 2018-06-06: 100 mL via INTRAVENOUS

## 2018-06-06 MED ORDER — ACETAMINOPHEN 325 MG PO TABS
650.0000 mg | ORAL_TABLET | ORAL | Status: DC | PRN
  Administered 2018-06-07: 650 mg via ORAL
  Filled 2018-06-06: qty 2

## 2018-06-06 MED ORDER — NITROGLYCERIN 0.4 MG SL SUBL: 0.4000 mg | SUBLINGUAL_TABLET | SUBLINGUAL | Status: DC | PRN

## 2018-06-06 MED ORDER — ONDANSETRON HCL 4 MG/2ML IJ SOLN: 4.0000 mg | Freq: Four times a day (QID) | INTRAMUSCULAR | Status: DC | PRN

## 2018-06-06 MED ORDER — NITROGLYCERIN 0.4 MG SL SUBL
0.4000 mg | SUBLINGUAL_TABLET | Freq: Once | SUBLINGUAL | Status: AC
  Administered 2018-06-06: 0.4 mg via SUBLINGUAL
  Filled 2018-06-06: qty 1

## 2018-06-06 MED ORDER — ASPIRIN 325 MG PO TABS
325.0000 mg | ORAL_TABLET | Freq: Once | ORAL | Status: DC
  Filled 2018-06-06: qty 1

## 2018-06-06 MED ORDER — NITROGLYCERIN 0.4 MG SL SUBL
0.4000 mg | SUBLINGUAL_TABLET | Freq: Once | SUBLINGUAL | Status: AC
  Administered 2018-06-06: 0.4 mg via SUBLINGUAL

## 2018-06-06 MED ORDER — PANTOPRAZOLE SODIUM 40 MG PO TBEC
40.0000 mg | DELAYED_RELEASE_TABLET | Freq: Every morning | ORAL | Status: DC
  Administered 2018-06-07: 40 mg via ORAL
  Filled 2018-06-06: qty 1

## 2018-06-06 MED ORDER — LIDOCAINE VISCOUS HCL 2 % MT SOLN
15.0000 mL | Freq: Once | OROMUCOSAL | Status: AC
  Administered 2018-06-06: 15 mL via ORAL
  Filled 2018-06-06: qty 15

## 2018-06-06 MED ORDER — SIMVASTATIN 20 MG PO TABS
40.0000 mg | ORAL_TABLET | Freq: Every evening | ORAL | Status: DC
  Administered 2018-06-06: 40 mg via ORAL
  Filled 2018-06-06: qty 4

## 2018-06-06 MED ORDER — INSULIN ASPART 100 UNIT/ML ~~LOC~~ SOLN
0.0000 [IU] | Freq: Three times a day (TID) | SUBCUTANEOUS | Status: DC
  Administered 2018-06-07: 1 [IU] via SUBCUTANEOUS

## 2018-06-06 MED ORDER — LISINOPRIL 10 MG PO TABS
10.0000 mg | ORAL_TABLET | Freq: Every morning | ORAL | Status: DC
  Administered 2018-06-07: 10 mg via ORAL
  Filled 2018-06-06: qty 1

## 2018-06-06 MED ORDER — ASPIRIN EC 81 MG PO TBEC
81.0000 mg | DELAYED_RELEASE_TABLET | Freq: Every day | ORAL | Status: DC
  Administered 2018-06-07: 81 mg via ORAL
  Filled 2018-06-06: qty 1

## 2018-06-06 NOTE — ED Triage Notes (Signed)
Pt c/o L sided CP that radiates to L neck, shoulder, and down L arm that has been intermittent for a year.

## 2018-06-06 NOTE — ED Notes (Signed)
Pt has taken a full strength ASA today.

## 2018-06-06 NOTE — ED Provider Notes (Addendum)
[facility] EMERGENCY DEPARTMENT Provider Note   CSN: [id number] Arrival date & time: 06/06/18  1412     History   Chief Complaint Chief Complaint  Patient presents with  . Chest Pain    HPI Shelby Morgan is a 62 y.o. female.  Aching chest pain for several months, worse the past few days.  Pain radiates to left shoulder, left neck, left jaw, left back.  No dyspnea, diaphoresis, nausea.  Cardiac risk factors include diabetes, hypertension, hypercholesterolemia.  No cigarette smoking or family history of hart disease.  Past medical history includes melanoma (stage II) 8 years ago.  Certain positions make pain worse.  Severity is moderate.  Review of systems positive for toothache left lower anterior tooth     Past Medical History:  Diagnosis Date  . Anxiety   . Bell's palsy 01/08/2011  . Chronic back pain   . Depression   . Diabetes mellitus   . Hepatomegaly   . Hypertension   . Melanoma [facility]) 2009   Dr Nevada Crane, Stage 2, required only surgery  . NAFLD (nonalcoholic fatty liver disease)     Patient Active Problem List   Diagnosis Date Noted  . Chest pain 06/06/2018  . Elevated LFTs 05/02/2012  . Lactic acidosis 05/02/2012  . Cellulitis of right leg 05/01/2012  . Diabetes mellitus type 2 in obese ([place]) 05/01/2012  . Hypokalemia 05/01/2012  . Hyponatremia 05/01/2012  . NAFLD (nonalcoholic fatty liver disease) 05/14/2011  . Obesity 05/14/2011  . Hemorrhoids 05/14/2011  . Transaminitis 01/29/2011  . Hepatomegaly 01/29/2011  . Rectal bleed 01/29/2011  . Dysphagia 01/29/2011  . RUQ pain 01/29/2011  . Hypercalcemia 01/29/2011  . Anxiety 01/29/2011  . GERD (gastroesophageal reflux disease) 01/29/2011    Past Surgical History:  Procedure Laterality Date  . CESAREAN SECTION    . COLONOSCOPY  03/09/2011   Dr Oneida Alar diverticulosis, internal and external hemorrhoids  . ESOPHAGOGASTRODUODENOSCOPY  03/09/2011   Esophageal stricture dilated to 16 MM savory, NSAID- induced  gastritis and duodenitis  . LEG SURGERY     plates/screws/hx fx-right leg  . MELANOMA EXCISION     right knee  . SAVORY DILATION  03/09/2011     OB History   None      Home Medications    Prior to Admission medications   Medication Sig Start Date End Date Taking? Authorizing Provider  acetaminophen (TYLENOL) 500 MG tablet Take 1,000 mg by mouth every 6 (six) hours as needed for mild pain or moderate pain.   Yes [provider]  aspirin 325 MG tablet Take 325 mg by mouth once as needed for mild pain or moderate pain.   Yes [provider]  co-enzyme Q-10 30 MG capsule Take 30 mg by mouth daily.   Yes [provider]  dorzolamide (TRUSOPT) 2 % ophthalmic solution Place 1 drop into the right eye 2 (two) times daily.   Yes [provider]  furosemide (LASIX) 20 MG tablet Take 20 mg by mouth every morning.   Yes [provider]  lisinopril (PRINIVIL,ZESTRIL) 10 MG tablet Take 10 mg by mouth every morning.  05/16/18  Yes [provider]  meloxicam (MOBIC) 15 MG tablet Take 15 mg by mouth every morning.   Yes [provider]  metFORMIN (GLUCOPHAGE-XR) 500 MG 24 hr tablet Take 1,000 mg by mouth 2 (two) times daily.    Yes [provider]  Multiple Vitamins-Minerals (MULTI-BETIC DIABETES) TABS Take 2 tablets by mouth daily.   Yes  [provider]  naproxen sodium (ALEVE) 220 MG tablet Take 220 mg by mouth 2 (two) times daily as needed (for back pain).    Yes [provider]  NON FORMULARY Take 1 capsule by mouth 3 (three) times daily.   Yes [provider]  pantoprazole (PROTONIX) 40 MG tablet TAKE ONE TABLET BY MOUTH ONCE DAILY Patient taking differently: Take 40 mg by mouth every morning.  07/12/15  Yes Mahala Menghini, PA-C  simvastatin (ZOCOR) 40 MG tablet Take 40 mg by mouth every evening.   Yes [provider]  sitaGLIPtin (JANUVIA) 100 MG tablet Take 100 mg by mouth every morning.     Yes [provider]    Family History Family History  Problem Relation Age of Onset  . Anesthesia problems Neg Hx   . Hypotension Neg Hx   . Malignant hyperthermia Neg Hx   . Pseudochol deficiency Neg Hx   . Colon cancer Neg Hx     Social History Social History   Tobacco Use  . Smoking status: Former Smoker    Packs/day: 0.50    Years: 11.00    Pack years: 5.50    Types: Cigarettes    Last attempt to quit: 03/03/1993    Years since quitting: 25.2  . Smokeless tobacco: Never Used  Substance Use Topics  . Alcohol use: No    Comment: wine 2-3 glasses per weekend  . Drug use: No     Allergies   Codeine   Review of Systems Review of Systems  All other systems reviewed and are negative.    Physical Exam Updated Vital Signs BP (!) 150/70   Pulse 73   Temp (!) 97.1 F (36.2 C) (Oral)   Resp 13   SpO2 99%   Physical Exam  Constitutional: She is oriented to person, place, and time. She appears well-developed and well-nourished.  Overweight;  nad  HENT:  Head: Normocephalic and atraumatic.  Oral exam: Missing tooth left lower anterior with gingival tenderness  Eyes: Conjunctivae are normal.  Neck: Neck supple.  Cardiovascular: Normal rate and regular rhythm.  Pulmonary/Chest: Effort normal and breath sounds normal.  Abdominal: Soft. Bowel sounds are normal.  Musculoskeletal: Normal range of motion.  Neurological: She is alert and oriented to person, place, and time.  Skin: Skin is warm and dry.  Psychiatric: She has a normal mood and affect. Her behavior is normal.  Nursing note and vitals reviewed.    ED Treatments / Results  Labs (all labs ordered are listed, but only abnormal results are displayed) Labs Reviewed  BASIC METABOLIC PANEL - Abnormal; Notable for the following components:      Result Value   Potassium 3.4 (*)    Glucose, Bld 102 (*)    All other components within normal limits  CBC - Abnormal; Notable for the following  components:   Hemoglobin 11.9 (*)    All other components within normal limits  D-DIMER, QUANTITATIVE (NOT AT [facility]) - Abnormal; Notable for the following components:   D-Dimer, Quant 1.08 (*)    All other components within normal limits  TROPONIN I  TROPONIN I    EKG EKG Interpretation  Date/Time:  Monday June 06 2018 14:19:52 EST Ventricular Rate:  76 PR Interval:  170 QRS Duration: 76 QT Interval:  372 QTC Calculation: 418 R Axis:   -11 Text Interpretation:  Normal sinus rhythm Left ventricular hypertrophy Possible Lateral infarct , age undetermined Abnormal ECG Confirmed by Nat Christen [id number])  on 06/06/2018 6:04:30 PM   Radiology Dg Chest 2 View  Result Date: 06/06/2018 CLINICAL DATA:  Chest and left arm pain for the past month. History of diabetes and hypertension. Former smoker. EXAM: CHEST - 2 VIEW COMPARISON:  PA and lateral chest x-ray of January 08, 2011 FINDINGS: The lungs are adequately inflated and clear. There is no pneumothorax or pleural effusion. The heart and pulmonary vascularity are normal. The mediastinum is normal in width. There is tortuosity of the descending thoracic aorta. There is faint calcification in the wall of the aortic arch. There is multilevel degenerative disc disease of the thoracic spine. IMPRESSION: There is no pneumonia, CHF, nor other acute cardiopulmonary abnormality. Thoracic aortic atherosclerosis. Electronically Signed   By: David  Martinique M.D.   On: 06/06/2018 15:07   Ct Angio Chest Pe W And/or Wo Contrast  Result Date: 06/06/2018 CLINICAL DATA:  62 year old female with a history of positive D-dimer EXAM: CT ANGIOGRAPHY CHEST WITH CONTRAST TECHNIQUE: Multidetector CT imaging of the chest was performed using the standard protocol during bolus administration of intravenous contrast. Multiplanar CT image reconstructions and MIPs were obtained to evaluate the vascular anatomy. CONTRAST:  165mL ISOVUE-370 IOPAMIDOL (ISOVUE-370) INJECTION 76%  COMPARISON:  None. FINDINGS: Cardiovascular: Heart: No cardiomegaly. No pericardial fluid/thickening. No significant coronary calcifications. Aorta: Unremarkable course, caliber, contour of the thoracic aorta. No aneurysm or dissection flap. No periaortic fluid. Pulmonary arteries: No central, lobar, segmental, or proximal subsegmental filling defects. Mediastinum/Nodes: Multiple mediastinal lymph nodes, with numerous small lymph nodes of the upper mediastinum, including prevascular, paratracheal, AP window nodes. There are enlarged nodes in the lowest paratracheal nodal stations, on the left measuring 19 mm, subcarinal measuring 12 mm. Borderline enlarged nodes of the bilateral hilar region. Unremarkable appearance of the thoracic esophagus. Unremarkable appearance of the thyroid. Lungs/Pleura: Central airways are clear. No pleural effusion. No confluent airspace disease. No pneumothorax. Upper Abdomen: No acute. Musculoskeletal: No acute displaced fracture. Degenerative changes of the spine. Review of the MIP images confirms the above findings. IMPRESSION: CT is negative for pulmonary embolism. No acute CT finding. Mediastinal and bilateral hilar adenopathy, without pulmonary findings to account for the changes. Referral for primary care/oncologic follow-up is recommended given the patient's history, with correlation with labs and potentially PET imaging. Electronically Signed   By: Corrie Mckusick D.O.   On: 06/06/2018 17:32    Procedures Procedures (including critical care time)  Medications Ordered in ED Medications  aspirin tablet 325 mg (has no administration in time range)  nitroGLYCERIN (NITROSTAT) SL tablet 0.4 mg (0.4 mg Sublingual Given 06/06/18 1629)  nitroGLYCERIN (NITROSTAT) SL tablet 0.4 mg (0.4 mg Sublingual Given 06/06/18 1654)  iopamidol (ISOVUE-370) 76 % injection 100 mL (100 mLs Intravenous Contrast Given 06/06/18 1713)     Initial Impression / Assessment and Plan / ED Course  I  have reviewed the triage vital signs and the nursing notes.  Pertinent labs & imaging results that were available during my care of the patient were reviewed by me and considered in my medical decision making (see chart for details).     Patient with multiple cardiac risk factors presents with worsening chest pain.  EKG, troponin negative.  CT angiogram reveals extensive mediastinal adenopathy.  Will discuss chest pain issue with cardiologist.  Nitroglycerin and aspirin administered.  Discussed mediastinal adenopathy with the patient.  She understands she will need further follow-up for this.  2100: Discussed with hospitalist.  Admit to general medicine.  CRITICAL CARE Performed by: Nat Christen Total  critical care time: 30 minutes Critical care time was exclusive of separately billable procedures and treating other patients. Critical care was necessary to treat or prevent imminent or life-threatening deterioration. Critical care was time spent personally by me on the following activities: development of treatment plan with patient and/or surrogate as well as nursing, discussions with consultants, evaluation of patient's response to treatment, examination of patient, obtaining history from patient or surrogate, ordering and performing treatments and interventions, ordering and review of laboratory studies, ordering and review of radiographic studies, pulse oximetry and re-evaluation of patient's condition.  Final Clinical Impressions(s) / ED Diagnoses   Final diagnoses:  Chest pain, unspecified type  Mediastinal adenopathy  Toothache    ED Discharge Orders    None       Nat Christen, MD 06/06/18 [place]    Nat Christen, MD 06/06/18 2122    Nat Christen, MD 06/06/18 2137    Nat Christen, MD 06/06/18 2222

## 2018-06-06 NOTE — H&P (Signed)
History and Physical    ARDEAN SIMONICH ZOX:[id number] DOB: [birth year] DOA: 06/06/2018  PCP: Redmond School, MD  Patient coming from: Home  I have personally briefly reviewed patient's old medical records in [place]  Chief Complaint: Left-sided chest pain  HPI: ADDILEE NEU is a 62 y.o. female with medical history significant for type 2 diabetes, hypertension, hyperlipidemia, NAFLD, depression/anxiety, obesity, and reported hx of melanoma s/p excision who presents to the ED with chest pain.  Patient reports the death of her husband over 1 year ago.  She says since then she has been having a lot of stress.  She has noticed having on and off episodes of left-sided chest aching/throbbing sensation which has radiated to her left shoulder, left upper back, left upper arm, and up her neck to her left jaw.  These episodes can occur at rest but more noticeable and worse when either laying flat or bending forward.  Some of the pain is exacerbated by lifting her left arm straight up overhead.  She says this time her pain began 2 days ago and persisted therefore she decided to come to the emergency department.  Her pain was 8/10 and improved to 5/10 with nitroglycerin.  She took two Aspirin 325 mg tablets for her jaw pain.  She reports associated palpitations and some lightheadedness without syncope.  She takes meloxicam daily for back pain and over-the-counter Tylenol and/or Aleve as needed for back pain.  She denies any dyspnea, diaphoresis, nausea, vomiting.  ED Course:  Initial vitals showed BP 174/83, pulse 94, RR 18, temp 97.14F, SPO2 96% on room air.  Lab work was notable for K 3.4, Cr 0.8, WBC 9.1, Hgb 11.9, Plt 336.  EKG showed normal sinus rhythm with low voltage in the lateral leads, no acute ischemic changes or significant changes compared to prior on 03/04/2011.  Initial troponin I was <0.03.  D-dimer was 1.08.  A CTA chest PE study was obtained which was negative for pulmonary  embolism or acute finding but did show mediastinal and bilateral hilar adenopathy without any pulmonary findings to account for the changes.  Per EDP, case was discussed with on-call cardiologist who recommended admission for chest pain observation.  The hospitalist service was consulted for admission.  Review of Systems: As per HPI otherwise 10 point review of systems negative.    Past Medical History:  Diagnosis Date  . Anxiety   . Bell's palsy 01/08/2011  . Chronic back pain   . Depression   . Diabetes mellitus   . Hepatomegaly   . Hypertension   . Melanoma [facility]) 2009   Dr [place] Crane, Stage 2, required only surgery  . NAFLD (nonalcoholic fatty liver disease)     Past Surgical History:  Procedure Laterality Date  . CESAREAN SECTION    . COLONOSCOPY  03/09/2011   Dr Oneida Alar diverticulosis, internal and external hemorrhoids  . ESOPHAGOGASTRODUODENOSCOPY  03/09/2011   Esophageal stricture dilated to 16 MM savory, NSAID- induced gastritis and duodenitis  . LEG SURGERY     plates/screws/hx fx-right leg  . MELANOMA EXCISION     right knee  . SAVORY DILATION  03/09/2011     reports that she quit smoking about 25 years ago. Her smoking use included cigarettes. She has a 5.50 pack-year smoking history. She has never used smokeless tobacco. She reports that she drinks alcohol. She reports that she does not use drugs.  Allergies  Allergen Reactions  . Codeine Nausea And Vomiting    Family  History  Problem Relation Age of Onset  . Heart disease Maternal Grandmother   . Heart disease Maternal Grandfather   . Anesthesia problems Neg Hx   . Hypotension Neg Hx   . Malignant hyperthermia Neg Hx   . Pseudochol deficiency Neg Hx   . Colon cancer Neg Hx      Prior to Admission medications   Medication Sig Start Date End Date Taking? Authorizing Provider  acetaminophen (TYLENOL) 500 MG tablet Take 1,000 mg by mouth every 6 (six) hours as needed for mild pain or moderate pain.   Yes  [provider]  aspirin 325 MG tablet Take 325 mg by mouth once as needed for mild pain or moderate pain.   Yes [provider]  co-enzyme Q-10 30 MG capsule Take 30 mg by mouth daily.   Yes [provider]  dorzolamide (TRUSOPT) 2 % ophthalmic solution Place 1 drop into the right eye 2 (two) times daily.   Yes [provider]  furosemide (LASIX) 20 MG tablet Take 20 mg by mouth every morning.   Yes [provider]  lisinopril (PRINIVIL,ZESTRIL) 10 MG tablet Take 10 mg by mouth every morning.  05/16/18  Yes [provider]  meloxicam (MOBIC) 15 MG tablet Take 15 mg by mouth every morning.   Yes [provider]  metFORMIN (GLUCOPHAGE-XR) 500 MG 24 hr tablet Take 1,000 mg by mouth 2 (two) times daily.    Yes [provider]  Multiple Vitamins-Minerals (MULTI-BETIC DIABETES) TABS Take 2 tablets by mouth daily.   Yes [provider]  naproxen sodium (ALEVE) 220 MG tablet Take 220 mg by mouth 2 (two) times daily as needed (for back pain).    Yes [provider]  NON FORMULARY Take 1 capsule by mouth 3 (three) times daily.   Yes [provider]  pantoprazole (PROTONIX) 40 MG tablet TAKE ONE TABLET BY MOUTH ONCE DAILY Patient taking differently: Take 40 mg by mouth every morning.  07/12/15  Yes Mahala Menghini, PA-C  simvastatin (ZOCOR) 40 MG tablet Take 40 mg by mouth every evening.   Yes [provider]  sitaGLIPtin (JANUVIA) 100 MG tablet Take 100 mg by mouth every morning.    Yes [provider]    Physical Exam: Vitals:   06/06/18 1730 06/06/18 1800 06/06/18 1834 06/06/18 2058  BP: (!) 142/70 139/71 (!) 150/70   Pulse: 75 77 71 73  Resp: 15 19 13 13   Temp:      TempSrc:      SpO2: 100% 99% 100% 99%    Constitutional: Obese woman, NAD, calm, comfortable Eyes: PERRL, lids and conjunctivae normal ENMT: Mucous membranes are moist. Posterior pharynx clear of any exudate or  lesions. Poor dentition. Neck: normal, supple, no masses. Respiratory: clear to auscultation bilaterally, no wheezing, no crackles. Normal respiratory effort. No accessory muscle use.  Cardiovascular: Regular rate and rhythm, no murmurs / rubs / gallops. No extremity edema. 2+ pedal pulses. Abdomen: no tenderness, no masses palpated. No hepatosplenomegaly. Bowel sounds positive.  Musculoskeletal: Mild reproducible chest tenderness with palpation across the lower left pectoralis muscle and over the left AC joint. No clubbing / cyanosis. No joint deformity upper and lower extremities. Good ROM, no contractures. Normal muscle tone.  Skin: no rashes, lesions, ulcers. No induration Neurologic: CN 2-12 grossly intact. Sensation intact. Strength 5/5 in all 4.  Psychiatric: Normal judgment and insight. Alert and oriented x 3. Normal mood.     Labs on Admission:  I have personally reviewed following labs and imaging studies  CBC: Recent Labs  Lab 06/06/18 1530  WBC 9.1  HGB 11.9*  HCT 38.0  MCV 85.6  PLT 505   Basic Metabolic Panel: Recent Labs  Lab 06/06/18 1530  NA 137  K 3.4*  CL 102  CO2 27  GLUCOSE 102*  BUN 20  CREATININE 0.80  CALCIUM 9.1   GFR: CrCl cannot be calculated (Unknown ideal weight.). Liver Function Tests: No results for input(s): AST, ALT, ALKPHOS, BILITOT, PROT, ALBUMIN in the last 168 hours. No results for input(s): LIPASE, AMYLASE in the last 168 hours. No results for input(s): AMMONIA in the last 168 hours. Coagulation Profile: No results for input(s): INR, PROTIME in the last 168 hours. Cardiac Enzymes: Recent Labs  Lab 06/06/18 1530 06/06/18 2108  TROPONINI <0.03 <0.03   BNP (last 3 results) No results for input(s): PROBNP in the last 8760 hours. HbA1C: No results for input(s): HGBA1C in the last 72 hours. CBG: No results for input(s): GLUCAP in the last 168 hours. Lipid Profile: No results for input(s): CHOL, HDL, LDLCALC, TRIG, CHOLHDL,  LDLDIRECT in the last 72 hours. Thyroid Function Tests: No results for input(s): TSH, T4TOTAL, FREET4, T3FREE, THYROIDAB in the last 72 hours. Anemia Panel: No results for input(s): VITAMINB12, FOLATE, FERRITIN, TIBC, IRON, RETICCTPCT in the last 72 hours. Urine analysis:    Component Value Date/Time   COLORURINE YELLOW 01/08/2011 1649   APPEARANCEUR HAZY (A) 01/08/2011 1649   LABSPEC 1.025 01/08/2011 1649   PHURINE 5.5 01/08/2011 1649   GLUCOSEU NEGATIVE 01/08/2011 1649   HGBUR NEGATIVE 01/08/2011 1649   BILIRUBINUR NEGATIVE 01/08/2011 1649   KETONESUR NEGATIVE 01/08/2011 1649   PROTEINUR NEGATIVE 01/08/2011 1649   UROBILINOGEN 0.2 01/08/2011 1649   NITRITE NEGATIVE 01/08/2011 1649   LEUKOCYTESUR MODERATE (A) 01/08/2011 1649    Radiological Exams on Admission: Dg Chest 2 View  Result Date: 06/06/2018 CLINICAL DATA:  Chest and left arm pain for the past month. History of diabetes and hypertension. Former smoker. EXAM: CHEST - 2 VIEW COMPARISON:  PA and lateral chest x-ray of January 08, 2011 FINDINGS: The lungs are adequately inflated and clear. There is no pneumothorax or pleural effusion. The heart and pulmonary vascularity are normal. The mediastinum is normal in width. There is tortuosity of the descending thoracic aorta. There is faint calcification in the wall of the aortic arch. There is multilevel degenerative disc disease of the thoracic spine. IMPRESSION: There is no pneumonia, CHF, nor other acute cardiopulmonary abnormality. Thoracic aortic atherosclerosis. Electronically Signed   By: David  Martinique M.D.   On: 06/06/2018 15:07   Ct Angio Chest Pe W And/or Wo Contrast  Result Date: 06/06/2018 CLINICAL DATA:  62 year old female with a history of positive D-dimer EXAM: CT ANGIOGRAPHY CHEST WITH CONTRAST TECHNIQUE: Multidetector CT imaging of the chest was performed using the standard protocol during bolus administration of intravenous contrast. Multiplanar CT image reconstructions  and MIPs were obtained to evaluate the vascular anatomy. CONTRAST:  133mL ISOVUE-370 IOPAMIDOL (ISOVUE-370) INJECTION 76% COMPARISON:  None. FINDINGS: Cardiovascular: Heart: No cardiomegaly. No pericardial fluid/thickening. No significant coronary calcifications. Aorta: Unremarkable course, caliber, contour of the thoracic aorta. No aneurysm or dissection flap. No periaortic fluid. Pulmonary arteries: No central, lobar, segmental, or proximal subsegmental filling defects. Mediastinum/Nodes: Multiple mediastinal lymph nodes, with numerous small lymph nodes of the upper mediastinum, including prevascular, paratracheal, AP window nodes. There are enlarged nodes in the lowest paratracheal nodal stations, on the left measuring 19  mm, subcarinal measuring 12 mm. Borderline enlarged nodes of the bilateral hilar region. Unremarkable appearance of the thoracic esophagus. Unremarkable appearance of the thyroid. Lungs/Pleura: Central airways are clear. No pleural effusion. No confluent airspace disease. No pneumothorax. Upper Abdomen: No acute. Musculoskeletal: No acute displaced fracture. Degenerative changes of the spine. Review of the MIP images confirms the above findings. IMPRESSION: CT is negative for pulmonary embolism. No acute CT finding. Mediastinal and bilateral hilar adenopathy, without pulmonary findings to account for the changes. Referral for primary care/oncologic follow-up is recommended given the patient's history, with correlation with labs and potentially PET imaging. Electronically Signed   By: Corrie Mckusick D.O.   On: 06/06/2018 17:32    EKG: Independently reviewed. Normal sinus rhythm with low voltage in the lateral leads, no acute ischemic changes or significant changes compared to prior on 03/04/2011.  Assessment/Plan Principal Problem:   Chest pain Active Problems:   GERD (gastroesophageal reflux disease)   NAFLD (nonalcoholic fatty liver disease)   Diabetes mellitus type 2 in obese (HCC)    Hypokalemia   Hypertension associated with diabetes ([place])   Hyperlipidemia associated with type 2 diabetes mellitus ([place])   Mediastinal adenopathy   LETTA CARGILE is a 62 y.o. female with medical history significant for type 2 diabetes, hypertension, hyperlipidemia, NAFLD, depression/anxiety, obesity, and reported hx of melanoma s/p excision who presents to the ED with chest pain.  Chest pain: Has atypical chest pain with some musculoskeletal features, however does have risk factors including T2DM, HTN, HLD, and obesity.  She also reports significant stress which could be contributing.  First 2 troponins are negative and EKG is without any acute ischemic changes.  Her left jaw pain may be related to poor dentition. -Repeat Troponin -ASA 81 mg daily -NTG prn -Consider inpatient vs outpatient stress test  T2DM: Takes metformin and Januvia at home. -Hold home oral meds -SSI -Check A1c  HTN: -Resume home lisinopril  HLD: -Resume home simvastatin  NAFLD: Previously seen by gastroenterology.  She has been working on weight loss. -Check LFTs -Encouraged on continued weight loss efforts and dietary changes  Hypokalemia: K 3.4 on admission. Replete with 40 mEq once.  Mediastinal and Hilar Adenopathy: Seen on CTA chest without pulmonary findings to account for changes.  She reports a history of melanoma s/p excision about 10 years ago. -Can likely follow up with oncology outpatient  GERD: Continue home Protonix   DVT prophylaxis: Lovenox Code Status: Full code Family Communication: No family present, patient's friend and pastor at bedside Disposition Plan: Pending inpatient versus outpatient stress testing Consults called: none  Admission status: Observation   Zada Finders MD [facility] Pager [phone]  If 7PM-7AM, please contact night-coverage [email or web address] Password [email or web address]  06/06/2018, 10:16 PM

## 2018-06-06 NOTE — ED Notes (Signed)
Went to give 325mg  aspirin, pt stated she took home aspirin an hour ago. 2 81mg  tablets. Dr Posey Pronto in room. Told to hold off for now.

## 2018-06-06 NOTE — ED Notes (Addendum)
EKG handed off to Dr. Lacinda Axon

## 2018-06-07 ENCOUNTER — Encounter (HOSPITAL_COMMUNITY): Payer: Self-pay | Admitting: *Deleted

## 2018-06-07 ENCOUNTER — Observation Stay (HOSPITAL_BASED_OUTPATIENT_CLINIC_OR_DEPARTMENT_OTHER): Payer: BLUE CROSS/BLUE SHIELD

## 2018-06-07 ENCOUNTER — Other Ambulatory Visit: Payer: Self-pay

## 2018-06-07 DIAGNOSIS — R590 Localized enlarged lymph nodes: Secondary | ICD-10-CM

## 2018-06-07 DIAGNOSIS — E1169 Type 2 diabetes mellitus with other specified complication: Secondary | ICD-10-CM

## 2018-06-07 DIAGNOSIS — E1159 Type 2 diabetes mellitus with other circulatory complications: Secondary | ICD-10-CM

## 2018-06-07 DIAGNOSIS — I10 Essential (primary) hypertension: Secondary | ICD-10-CM

## 2018-06-07 DIAGNOSIS — R079 Chest pain, unspecified: Secondary | ICD-10-CM

## 2018-06-07 DIAGNOSIS — K219 Gastro-esophageal reflux disease without esophagitis: Secondary | ICD-10-CM

## 2018-06-07 DIAGNOSIS — E785 Hyperlipidemia, unspecified: Secondary | ICD-10-CM

## 2018-06-07 DIAGNOSIS — E669 Obesity, unspecified: Secondary | ICD-10-CM

## 2018-06-07 LAB — TROPONIN I: Troponin I: <0.03 ng/mL (ref <0.03)

## 2018-06-07 LAB — GLUCOSE, CAPILLARY
GLUCOSE-CAPILLARY: 126 mg/dL — AB (ref 70–99)
Glucose-Capillary: 116 mg/dL — ABNORMAL HIGH (ref 70–99)

## 2018-06-07 LAB — NM MYOCAR MULTI W/SPECT W/WALL MOTION / EF
CHL CUP NUCLEAR SDS: 4
CHL CUP RESTING HR STRESS: 65 {beats}/min
CSEPPHR: 96 {beats}/min
LVDIAVOL: 83 mL (ref 46–106)
LVSYSVOL: 27 mL
RATE: 0.55
SRS: 5
SSS: 9
TID: 1.36

## 2018-06-07 LAB — HEMOGLOBIN A1C
Hgb A1c MFr Bld: 6.5 % — ABNORMAL HIGH (ref 4.8–5.6)
Mean Plasma Glucose: 139.85 mg/dL

## 2018-06-07 MED ORDER — REGADENOSON 0.4 MG/5ML IV SOLN
INTRAVENOUS | Status: AC
  Administered 2018-06-07: 0.4 mg via INTRAVENOUS
  Filled 2018-06-07: qty 5

## 2018-06-07 MED ORDER — TECHNETIUM TC 99M TETROFOSMIN IV KIT
30.0000 | PACK | Freq: Once | INTRAVENOUS | Status: AC | PRN
  Administered 2018-06-07: 30 via INTRAVENOUS

## 2018-06-07 MED ORDER — TECHNETIUM TC 99M TETROFOSMIN IV KIT
10.0000 | PACK | Freq: Once | INTRAVENOUS | Status: AC | PRN
  Administered 2018-06-07: 10.3 via INTRAVENOUS

## 2018-06-07 MED ORDER — SODIUM CHLORIDE 0.9% FLUSH
INTRAVENOUS | Status: AC
  Administered 2018-06-07: 10 mL via INTRAVENOUS
  Filled 2018-06-07: qty 10

## 2018-06-07 NOTE — Progress Notes (Signed)
Patient states understanding of discharge instructions.  

## 2018-06-07 NOTE — Consult Note (Addendum)
Cardiology Consult    Patient ID: Shelby Morgan; [id number]; 1955-10-25   Admit date: 06/06/2018 Date of Consult: 06/07/2018  Primary Care Provider: Redmond School, MD Primary Cardiologist: New to [facility] - Dr. Bronson Ing  Patient Profile    Shelby Morgan is a 62 y.o. female with past medical history of HTN, HLD, and Type 2 DM who is being seen today for the evaluation of chest pain at the request of Dr. Manuella Ghazi.   History of Present Illness    Shelby Morgan presented to [facility] ED on 06/06/2018 for evaluation of left-sided chest discomfort.  In talking with the patient today, she reports having intermittent episodes of left-sided chest pain over the past year which can occur a few times per week. Reports that her pain typically comes on when lifting objects off the floor or being in certain positions. She has not noticed any association with exertion. Was evaluated by her PCP for this several months ago and watchful waiting was recommended with her not having been referred for further evaluation.  She reports that starting 2 days ago, she developed pain that radiated into her neck and shoulder which was new for her. The pain started in the early morning hours and persisted throughout the entire day. Pain was not worse with inspiration or exertion. She does report having to bend down to pick up an object and her pain intensified with this. No recent orthopnea, PND, or lower extremity edema.   She has a known history of HTN, HLD, Type II DM, and family history of CAD in her grandparents.  Denies any known history of CAD for herself. Reports social alcohol use but denies any binge drinking or recreational drug use. She is a former smoker but quit 20+ years ago. Does report being under increased stress over the past year due to the death of her husband.   Initial labs showed WBC 9.1, Hgb 11.9, platelets 336, Na+ 137, K+ 3.4, and creatinine 0.80.  D-dimer elevated to 1.08.  Initial and cyclic  troponin values have been negative. EKG shows NSR, HR 76, borderline LVH, and no acute ST changes when compared to prior tracings. CXR showed no acute cardiopulmonary abnormalities with thoracic aortic atherosclerosis present. CTA was negative for a PE but did show mediastinal and bilateral hilar adenopathy. The report does mention no significant coronary calcifications were present.  Past Medical History:  Diagnosis Date  . Anxiety   . Bell's palsy 01/08/2011  . Chronic back pain   . Depression   . Diabetes mellitus   . Hepatomegaly   . Hypertension   . Melanoma [facility]) 2009   Dr Nevada Crane, Stage 2, required only surgery  . NAFLD (nonalcoholic fatty liver disease)     Past Surgical History:  Procedure Laterality Date  . CESAREAN SECTION    . COLONOSCOPY  03/09/2011   Dr Oneida Alar diverticulosis, internal and external hemorrhoids  . ESOPHAGOGASTRODUODENOSCOPY  03/09/2011   Esophageal stricture dilated to 16 MM savory, NSAID- induced gastritis and duodenitis  . LEG SURGERY     plates/screws/hx fx-right leg  . MELANOMA EXCISION     right knee  . SAVORY DILATION  03/09/2011     Home Medications:  Prior to Admission medications   Medication Sig Start Date End Date Taking? Authorizing Provider  acetaminophen (TYLENOL) 500 MG tablet Take 1,000 mg by mouth every 6 (six) hours as needed for mild pain or moderate pain.   Yes [provider]  aspirin 325 MG  tablet Take 325 mg by mouth once as needed for mild pain or moderate pain.   Yes [provider]  co-enzyme Q-10 30 MG capsule Take 30 mg by mouth daily.   Yes [provider]  dorzolamide (TRUSOPT) 2 % ophthalmic solution Place 1 drop into the right eye 2 (two) times daily.   Yes [provider]  furosemide (LASIX) 20 MG tablet Take 20 mg by mouth every morning.   Yes [provider]  lisinopril (PRINIVIL,ZESTRIL) 10 MG tablet Take 10 mg by mouth every morning.  05/16/18  Yes [provider]    meloxicam (MOBIC) 15 MG tablet Take 15 mg by mouth every morning.   Yes [provider]  metFORMIN (GLUCOPHAGE-XR) 500 MG 24 hr tablet Take 1,000 mg by mouth 2 (two) times daily.    Yes [provider]  Multiple Vitamins-Minerals (MULTI-BETIC DIABETES) TABS Take 2 tablets by mouth daily.   Yes [provider]  naproxen sodium (ALEVE) 220 MG tablet Take 220 mg by mouth 2 (two) times daily as needed (for back pain).    Yes [provider]  NON FORMULARY Take 1 capsule by mouth 3 (three) times daily.   Yes [provider]  pantoprazole (PROTONIX) 40 MG tablet TAKE ONE TABLET BY MOUTH ONCE DAILY Patient taking differently: Take 40 mg by mouth every morning.  07/12/15  Yes Mahala Menghini, PA-C  simvastatin (ZOCOR) 40 MG tablet Take 40 mg by mouth every evening.   Yes [provider]  sitaGLIPtin (JANUVIA) 100 MG tablet Take 100 mg by mouth every morning.    Yes [provider]    Inpatient Medications: Scheduled Meds: . aspirin EC  81 mg Oral Daily  . enoxaparin (LOVENOX) injection  40 mg Subcutaneous Q24H  . insulin aspart  0-9 Units Subcutaneous TID WC  . lisinopril  10 mg Oral q morning - 10a  . pantoprazole  40 mg Oral q morning - 10a  . simvastatin  40 mg Oral QPM   Continuous Infusions:  PRN Meds: acetaminophen, nitroGLYCERIN, ondansetron (ZOFRAN) IV  Allergies:    Allergies  Allergen Reactions  . Codeine Nausea And Vomiting    Social History:   Social History   Socioeconomic History  . Marital status: Married    Spouse name: Not on file  . Number of children: 1  . Years of education: Not on file  . Highest education level: Not on file  Occupational History  . 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 Financial resource strain: Patient refused  . Food insecurity:    Worry: Patient refused    Inability: Patient refused  . Transportation needs:     Medical: Patient refused    Non-medical: Patient refused  Tobacco Use  . Smoking status: Former Smoker    Packs/day: 0.50    Years: 11.00    Pack years: 5.50    Types: Cigarettes    Last attempt to quit: 03/03/1993    Years since quitting: 25.2  . Smokeless tobacco: Never Used  Substance and Sexual Activity  . Alcohol use: Yes    Comment: wine 2-3 glasses per weekend  . Drug use: No  . Sexual activity: Yes    Birth control/protection: Post-menopausal  Lifestyle  . Physical activity:    Days per week: Patient refused    Minutes per session: Patient refused  . Stress: Patient refused  Relationships  .  Social connections:    Talks on phone: Patient refused    Gets together: Patient refused    Attends religious service: Patient refused    Active member of club or organization: Patient refused    Attends meetings of clubs or organizations: Patient refused    Relationship status: Patient refused  . Intimate partner violence:    Fear of current or ex partner: Patient refused    Emotionally abused: Patient refused    Physically abused: Patient refused    Forced sexual activity: Patient refused  Other Topics Concern  . Not on file  Social History Narrative  . Not on file     Family History:    Family History  Problem Relation Age of Onset  . Heart disease Maternal Grandmother   . Heart disease Maternal Grandfather   . Anesthesia problems Neg Hx   . Hypotension Neg Hx   . Malignant hyperthermia Neg Hx   . Pseudochol deficiency Neg Hx   . Colon cancer Neg Hx       Review of Systems    General:  No chills, fever, night sweats or weight changes.  Cardiovascular:  No dyspnea on exertion, edema, orthopnea, palpitations, paroxysmal nocturnal dyspnea. Positive for chest pain.  Dermatological: No rash, lesions/masses Respiratory: No cough, dyspnea Urologic: No hematuria, dysuria Abdominal:   No nausea, vomiting, diarrhea, bright red blood per rectum, melena, or  hematemesis Neurologic:  No visual changes, wkns, changes in mental status. All other systems reviewed and are otherwise negative except as noted above.  Physical Exam/Data    Vitals:   06/07/18 0604 06/07/18 0630 06/07/18 0816 06/07/18 0820  BP:   (!) 163/89   Pulse: 66 62 75   Resp: 14 15 20    Temp:   98.4 F (36.9 C)   TempSrc:   Oral   SpO2: 95% 93% 100%   Weight:    99.5 kg  Height:    5\' 2"  (1.575 m)    Intake/Output Summary (Last 24 hours) at 06/07/2018 1035 Last data filed at 06/07/2018 0642 Gross per 24 hour  Intake -  Output 500 ml  Net -500 ml   Filed Weights   06/07/18 0820  Weight: 99.5 kg   Body mass index is 40.12 kg/m.   General: Pleasant, obese Caucasian female, currently in NAD Psych: Normal affect. Neuro: Alert and oriented X 3. Moves all extremities spontaneously. HEENT: Normal  Neck: Supple without bruits or JVD. Lungs:  Resp regular and unlabored, CTA without wheezing or rales. Heart: RRR no s3, s4, or murmurs. Abdomen: Soft, non-tender, non-distended, BS + x 4.  Extremities: No clubbing, cyanosis or lower extremity edema. Right knee in brace.  DP/PT/Radials 2+ and equal bilaterally.    Labs/Studies     Relevant CV Studies:  None on file.   Laboratory Data:  Chemistry Recent Labs  Lab 06/06/18 1530  NA 137  K 3.4*  CL 102  CO2 27  GLUCOSE 102*  BUN 20  CREATININE 0.80  CALCIUM 9.1  GFRNONAA >60  GFRAA >60  ANIONGAP 8    Recent Labs  Lab 06/06/18 1530  PROT 7.9  ALBUMIN 4.0  AST 21  ALT 21  ALKPHOS 45  BILITOT 0.9   Hematology Recent Labs  Lab 06/06/18 1530  WBC 9.1  RBC 4.44  HGB 11.9*  HCT 38.0  MCV 85.6  MCH 26.8  MCHC 31.3  RDW 13.7  PLT 336   Cardiac Enzymes Recent Labs  Lab 06/06/18 1530 06/06/18  2108 06/07/18 0317  TROPONINI <0.03 <0.03 <0.03   No results for input(s): TROPIPOC in the last 168 hours.  BNPNo results for input(s): BNP, PROBNP in the last 168 hours.  DDimer  Recent Labs   Lab 06/06/18 1536  DDIMER 1.08*    Radiology/Studies:  Dg Chest 2 View  Result Date: 06/06/2018 CLINICAL DATA:  Chest and left arm pain for the past month. History of diabetes and hypertension. Former smoker. EXAM: CHEST - 2 VIEW COMPARISON:  PA and lateral chest x-ray of January 08, 2011 FINDINGS: The lungs are adequately inflated and clear. There is no pneumothorax or pleural effusion. The heart and pulmonary vascularity are normal. The mediastinum is normal in width. There is tortuosity of the descending thoracic aorta. There is faint calcification in the wall of the aortic arch. There is multilevel degenerative disc disease of the thoracic spine. IMPRESSION: There is no pneumonia, CHF, nor other acute cardiopulmonary abnormality. Thoracic aortic atherosclerosis. Electronically Signed   By: David  Martinique M.D.   On: 06/06/2018 15:07   Ct Angio Chest Pe W And/or Wo Contrast  Result Date: 06/06/2018 CLINICAL DATA:  62 year old female with a history of positive D-dimer EXAM: CT ANGIOGRAPHY CHEST WITH CONTRAST TECHNIQUE: Multidetector CT imaging of the chest was performed using the standard protocol during bolus administration of intravenous contrast. Multiplanar CT image reconstructions and MIPs were obtained to evaluate the vascular anatomy. CONTRAST:  113mL ISOVUE-370 IOPAMIDOL (ISOVUE-370) INJECTION 76% COMPARISON:  None. FINDINGS: Cardiovascular: Heart: No cardiomegaly. No pericardial fluid/thickening. No significant coronary calcifications. Aorta: Unremarkable course, caliber, contour of the thoracic aorta. No aneurysm or dissection flap. No periaortic fluid. Pulmonary arteries: No central, lobar, segmental, or proximal subsegmental filling defects. Mediastinum/Nodes: Multiple mediastinal lymph nodes, with numerous small lymph nodes of the upper mediastinum, including prevascular, paratracheal, AP window nodes. There are enlarged nodes in the lowest paratracheal nodal stations, on the left measuring  19 mm, subcarinal measuring 12 mm. Borderline enlarged nodes of the bilateral hilar region. Unremarkable appearance of the thoracic esophagus. Unremarkable appearance of the thyroid. Lungs/Pleura: Central airways are clear. No pleural effusion. No confluent airspace disease. No pneumothorax. Upper Abdomen: No acute. Musculoskeletal: No acute displaced fracture. Degenerative changes of the spine. Review of the MIP images confirms the above findings. IMPRESSION: CT is negative for pulmonary embolism. No acute CT finding. Mediastinal and bilateral hilar adenopathy, without pulmonary findings to account for the changes. Referral for primary care/oncologic follow-up is recommended given the patient's history, with correlation with labs and potentially PET imaging. Electronically Signed   By: Corrie Mckusick D.O.   On: 06/06/2018 17:32     Assessment & Plan    1. Atypical Chest Pain - She presents with intermittent episodes of left-sided chest discomfort over the past year which acutely worsened over the past 2 days. Her discomfort has now started to radiate into her neck and shoulders. Her pain can last for an entire day and is sometimes worse with leaning forward or positional changes. No association with exertion. - She denies any known history of CAD but does have multiple cardiac risk factors including HTN, HLD, Type 2 DM, family history of CAD, and prior tobacco use.  - D-dimer elevated to 1.08  Initial and cyclic troponin values have been negative. EKG shows no acute ST changes when compared to prior tracings. CTA was negative for a PE but did show adenopathy as outlined below. Reviewed with Dr. Bronson Ing and will plan for a Lexiscan Myoview this morning for ischemic evaluation.  She has been NPO since midnight. Continue ASA and statin therapy.   2. HTN - BP has been variable at 113/55 - 174/89 since admission.  - remains on Lisinopril 10mg  daily which can be further titrated if BP remains above goal.     3. HLD - followed by PCP. She has been continued on PTA Simvastatin 40mg  daily.   4. Type 2 DM - Hgb A1c is pending. On Metformin and Januvia as an outpatient.   5. Abnormal Chest CT - CTA on admission did show mediastinal and bilateral hilar adenopathy, without pulmonary findings to account for the changes. The patient is aware of these findings and plans to follow-up with Oncology.    For questions or updates, please contact [place] Please consult [email or web address] for contact info under Cardiology/STEMI.  Signed, Erma Heritage, PA-C 06/07/2018, 10:35 AM Pager: [phone]  The patient was seen and examined, and I agree with the history, physical exam, assessment and plan as documented above, with modifications as noted below. I have also personally reviewed all relevant documentation, old records, labs, and both radiographic and cardiovascular studies. I have also independently interpreted old and new ECG's.  Briefly, this is a 62 year old woman with a history of morbid obesity, hypertension, hyperlipidemia, and type 2 diabetes mellitus who presented to the ED with chest pain.  Is been going on for over a year and can occur both at rest and with exertion.  She noticed it before her husband passed away a year ago.  He had CHF and was followed by the [place].  Over the past week and she noticed it occurring more frequently and with greater intensity.  She describes it as a soreness over the upper part of her left breast.  She also noticed left shoulder soreness and pain in the left side of her neck.  She mentioned being at [facility] and bending down to pick up something off the shelf and chest pain got worse.  She denies orthopnea, leg swelling, and paroxysmal nocturnal dyspnea.  Troponins have been normal.  ECG has nonspecific T wave abnormalities and LVH.  Chest x-ray showed no acute cardiopulmonary abnormalities.  Chest CT showed no evidence of pulmonary embolism but did  show bilateral hilar and mediastinal lymphadenopathy.  She does have a history of melanoma.  No coronary artery calcifications were noted.  Symptoms are somewhat atypical with a positional component noted.  That being said, she has several cardiovascular risk factors as detailed above.    We will plan for an inpatient [employer] for further evaluation.  She will need outpatient follow-up with oncology regarding her history of melanoma with bilateral hilar and mediastinal lymphadenopathy.   Kate Sable, MD, [facility]  06/07/2018 11:07 AM

## 2018-06-07 NOTE — Discharge Summary (Signed)
Physician Discharge Summary  Shelby Morgan STM:[id number] DOB: 12-12-1955 DOA: 06/06/2018  PCP: Redmond School, MD  Admit date: 06/06/2018  Discharge date: 06/07/2018  Admitted From:Home   Disposition:  Home  Recommendations for Outpatient Follow-up:  1. Follow up with PCP in 1-2 weeks 2. Will need further evaluation of chest pain to see whether there is GI etiology or anxiety or musculoskeletal involvement 3. Will need follow-up to oncology for evaluation of mediastinal and bilateral hilar adenopathy with prior history of melanoma.  Please refer to oncologist of choice.  Home Health: None  Equipment/Devices: None  Discharge Condition: Stable  CODE STATUS: Full  Diet recommendation: Heart Healthy-carb modified  Brief/Interim Summary: Shelby Morgan is a 62 y.o. female with medical history significant for type 2 diabetes, hypertension, hyperlipidemia, NAFLD, depression/anxiety, obesity, and reported hx of melanoma s/p excision who presents to the ED with chest pain.  Patient reports the death of her husband over 1 year ago.  She says since then she has been having a lot of stress.  She has noticed having on and off episodes of left-sided chest aching/throbbing sensation which has radiated to her left shoulder, left upper back, left upper arm, and up her neck to her left jaw.    Patient was admitted with atypical chest pain and was noted to have negative cardiac enzymes as well as EKG with no suspicion of MI.  Cardiology was consulted with inpatient stress test that was also found to be within normal limits with EF 68%.  She was encouraged to follow-up with her PCP regarding other etiology of her chest pain and to follow-up with oncology regarding mediastinal and hilar lymphadenopathy noted incidentally on CT chest, especially in light of prior history of melanoma.  Discharge Diagnoses:  Principal Problem:   Chest pain Active Problems:   GERD (gastroesophageal reflux disease)    NAFLD (nonalcoholic fatty liver disease)   Diabetes mellitus type 2 in obese (HCC)   Hypokalemia   Hypertension associated with diabetes ([place])   Hyperlipidemia associated with type 2 diabetes mellitus ([place])   Mediastinal adenopathy  Principal diagnosis: Atypical chest pain.  Discharge Instructions  Discharge Instructions    Diet - low sodium heart healthy   Complete by:  As directed    Increase activity slowly   Complete by:  As directed      Allergies as of 06/07/2018      Reactions   Codeine Nausea And Vomiting      Medication List    TAKE these medications   acetaminophen 500 MG tablet Commonly known as:  TYLENOL Take 1,000 mg by mouth every 6 (six) hours as needed for mild pain or moderate pain.   ALEVE 220 MG tablet Generic drug:  naproxen sodium Take 220 mg by mouth 2 (two) times daily as needed (for back pain).   aspirin 325 MG tablet Take 325 mg by mouth once as needed for mild pain or moderate pain.   co-enzyme Q-10 30 MG capsule Take 30 mg by mouth daily.   dorzolamide 2 % ophthalmic solution Commonly known as:  TRUSOPT Place 1 drop into the right eye 2 (two) times daily.   furosemide 20 MG tablet Commonly known as:  LASIX Take 20 mg by mouth every morning.   lisinopril 10 MG tablet Commonly known as:  PRINIVIL,ZESTRIL Take 10 mg by mouth every morning.   meloxicam 15 MG tablet Commonly known as:  MOBIC Take 15 mg by mouth every morning.   metFORMIN 500  MG 24 hr tablet Commonly known as:  GLUCOPHAGE-XR Take 1,000 mg by mouth 2 (two) times daily.   MULTI-BETIC DIABETES Tabs Take 2 tablets by mouth daily.   NON FORMULARY Take 1 capsule by mouth 3 (three) times daily.   pantoprazole 40 MG tablet Commonly known as:  PROTONIX TAKE ONE TABLET BY MOUTH ONCE DAILY What changed:  when to take this   simvastatin 40 MG tablet Commonly known as:  ZOCOR Take 40 mg by mouth every evening.   sitaGLIPtin 100 MG tablet Commonly known as:   JANUVIA Take 100 mg by mouth every morning.      Follow-up Information    Redmond School, MD. Schedule an appointment as soon as possible for a visit in 1 week(s).   Specialty:  Internal Medicine Contact information: [address] [place] [address] [phone]          Allergies  Allergen Reactions  . Codeine Nausea And Vomiting    Consultations:  Cardiology-Dr. Bronson Ing   Procedures/Studies: Dg Chest 2 View  Result Date: 06/06/2018 CLINICAL DATA:  Chest and left arm pain for the past month. History of diabetes and hypertension. Former smoker. EXAM: CHEST - 2 VIEW COMPARISON:  PA and lateral chest x-ray of January 08, 2011 FINDINGS: The lungs are adequately inflated and clear. There is no pneumothorax or pleural effusion. The heart and pulmonary vascularity are normal. The mediastinum is normal in width. There is tortuosity of the descending thoracic aorta. There is faint calcification in the wall of the aortic arch. There is multilevel degenerative disc disease of the thoracic spine. IMPRESSION: There is no pneumonia, CHF, nor other acute cardiopulmonary abnormality. Thoracic aortic atherosclerosis. Electronically Signed   By: David  Martinique M.D.   On: 06/06/2018 15:07   Ct Angio Chest Pe W And/or Wo Contrast  Result Date: 06/06/2018 CLINICAL DATA:  62 year old female with a history of positive D-dimer EXAM: CT ANGIOGRAPHY CHEST WITH CONTRAST TECHNIQUE: Multidetector CT imaging of the chest was performed using the standard protocol during bolus administration of intravenous contrast. Multiplanar CT image reconstructions and MIPs were obtained to evaluate the vascular anatomy. CONTRAST:  125mL ISOVUE-370 IOPAMIDOL (ISOVUE-370) INJECTION 76% COMPARISON:  None. FINDINGS: Cardiovascular: Heart: No cardiomegaly. No pericardial fluid/thickening. No significant coronary calcifications. Aorta: Unremarkable course, caliber, contour of the thoracic aorta. No aneurysm or  dissection flap. No periaortic fluid. Pulmonary arteries: No central, lobar, segmental, or proximal subsegmental filling defects. Mediastinum/Nodes: Multiple mediastinal lymph nodes, with numerous small lymph nodes of the upper mediastinum, including prevascular, paratracheal, AP window nodes. There are enlarged nodes in the lowest paratracheal nodal stations, on the left measuring 19 mm, subcarinal measuring 12 mm. Borderline enlarged nodes of the bilateral hilar region. Unremarkable appearance of the thoracic esophagus. Unremarkable appearance of the thyroid. Lungs/Pleura: Central airways are clear. No pleural effusion. No confluent airspace disease. No pneumothorax. Upper Abdomen: No acute. Musculoskeletal: No acute displaced fracture. Degenerative changes of the spine. Review of the MIP images confirms the above findings. IMPRESSION: CT is negative for pulmonary embolism. No acute CT finding. Mediastinal and bilateral hilar adenopathy, without pulmonary findings to account for the changes. Referral for primary care/oncologic follow-up is recommended given the patient's history, with correlation with labs and potentially PET imaging. Electronically Signed   By: Corrie Mckusick D.O.   On: 06/06/2018 17:32   Nm Myocar Multi W/spect W/wall Motion / Ef  Result Date: 06/07/2018  There was no ST segment deviation noted during stress.  Defect 1: There is a  medium defect of mild severity present in the mid inferior and apical inferior location. This is likely due to soft tissue attenuation.  The study is normal. No myocardial ischemia or scar.  This is a low risk study.  Nuclear stress EF: 68%.    Dg Knee Complete 4 Views Right  Result Date: 05/17/2018 CLINICAL DATA:  62 year old female injured knee while moving boxes. Initial encounter. EXAM: RIGHT KNEE - COMPLETE 4+ VIEW COMPARISON:  None. FINDINGS: No fracture or dislocation.  No joint effusion noted. Minimal lateral tibiofemoral joint space narrowing.  Minimal patellofemoral joint degenerative changes. 9 mm nonspecific eggshell calcification anteromedial aspect of the proximal tibia. IMPRESSION: 1.  No fracture or dislocation. 2. Minimal lateral tibiofemoral joint space narrowing. 3. Minimal patellofemoral joint degenerative changes. Electronically Signed   By: Genia Del M.D.   On: 05/17/2018 08:40    Discharge Exam: Vitals:   06/07/18 0630 06/07/18 0816  BP:  (!) 163/89  Pulse: 62 75  Resp: 15 20  Temp:  98.4 F (36.9 C)  SpO2: 93% 100%   Vitals:   06/07/18 0604 06/07/18 0630 06/07/18 0816 06/07/18 0820  BP:   (!) 163/89   Pulse: 66 62 75   Resp: 14 15 20    Temp:   98.4 F (36.9 C)   TempSrc:   Oral   SpO2: 95% 93% 100%   Weight:    99.5 kg  Height:    5\' 2"  (1.575 m)    General: Pt is alert, awake, not in acute distress Cardiovascular: RRR, S1/S2 +, no rubs, no gallops Respiratory: CTA bilaterally, no wheezing, no rhonchi Abdominal: Soft, NT, ND, bowel sounds + Extremities: no edema, no cyanosis    The results of significant diagnostics from this hospitalization (including imaging, microbiology, ancillary and laboratory) are listed below for reference.     Microbiology: No results found for this or any previous visit (from the past 240 hour(s)).   Labs: BNP (last 3 results) No results for input(s): BNP in the last 8760 hours. Basic Metabolic Panel: Recent Labs  Lab 06/06/18 1530  NA 137  K 3.4*  CL 102  CO2 27  GLUCOSE 102*  BUN 20  CREATININE 0.80  CALCIUM 9.1   Liver Function Tests: Recent Labs  Lab 06/06/18 1530  AST 21  ALT 21  ALKPHOS 45  BILITOT 0.9  PROT 7.9  ALBUMIN 4.0   No results for input(s): LIPASE, AMYLASE in the last 168 hours. No results for input(s): AMMONIA in the last 168 hours. CBC: Recent Labs  Lab 06/06/18 1530  WBC 9.1  HGB 11.9*  HCT 38.0  MCV 85.6  PLT 336   Cardiac Enzymes: Recent Labs  Lab 06/06/18 1530 06/06/18 2108 06/07/18 0317  TROPONINI <0.03  <0.03 <0.03   BNP: Invalid input(s): POCBNP CBG: Recent Labs  Lab 06/06/18 2330 06/07/18 0849 06/07/18 1202  GLUCAP 115* 116* 126*   D-Dimer Recent Labs    06/06/18 1536  DDIMER 1.08*   Hgb A1c Recent Labs    06/06/18 1530  HGBA1C 6.5*   Lipid Profile No results for input(s): CHOL, HDL, LDLCALC, TRIG, CHOLHDL, LDLDIRECT in the last 72 hours. Thyroid function studies No results for input(s): TSH, T4TOTAL, T3FREE, THYROIDAB in the last 72 hours.  Invalid input(s): FREET3 Anemia work up No results for input(s): VITAMINB12, FOLATE, FERRITIN, TIBC, IRON, RETICCTPCT in the last 72 hours. Urinalysis    Component Value Date/Time   COLORURINE YELLOW 01/08/2011 1649   APPEARANCEUR HAZY (A) 01/08/2011  1649   LABSPEC 1.025 01/08/2011 1649   PHURINE 5.5 01/08/2011 1649   GLUCOSEU NEGATIVE 01/08/2011 1649   HGBUR NEGATIVE 01/08/2011 1649   BILIRUBINUR NEGATIVE 01/08/2011 1649   KETONESUR NEGATIVE 01/08/2011 1649   PROTEINUR NEGATIVE 01/08/2011 1649   UROBILINOGEN 0.2 01/08/2011 1649   NITRITE NEGATIVE 01/08/2011 1649   LEUKOCYTESUR MODERATE (A) 01/08/2011 1649   Sepsis Labs Invalid input(s): PROCALCITONIN,  WBC,  LACTICIDVEN Microbiology No results found for this or any previous visit (from the past 240 hour(s)).   Time coordinating discharge: 35 minutes  SIGNED:   Rodena Goldmann, DO [facility] 06/07/2018, 2:53 PM Pager [phone]  If 7PM-7AM, please contact night-coverage [email or web address] Password [email or web address]

## 2018-06-08 ENCOUNTER — Ambulatory Visit: Payer: BLUE CROSS/BLUE SHIELD | Admitting: Orthopaedic Surgery

## 2018-06-08 ENCOUNTER — Encounter: Payer: Self-pay | Admitting: Orthopaedic Surgery

## 2018-06-08 VITALS — BP 150/87 | HR 82 | Ht 62.0 in | Wt 221.0 lb

## 2018-06-08 DIAGNOSIS — G8929 Other chronic pain: Secondary | ICD-10-CM

## 2018-06-08 DIAGNOSIS — M25561 Pain in right knee: Secondary | ICD-10-CM | POA: Diagnosis not present

## 2018-06-08 LAB — HIV ANTIBODY (ROUTINE TESTING W REFLEX): HIV Screen 4th Generation wRfx: NONREACTIVE

## 2018-06-08 NOTE — Progress Notes (Signed)
Subjective:    Patient ID: Shelby Morgan, female    DOB: 1955-11-02, 62 y.o.   MRN: [id number]  HPI She has had pain in the right knee for six weeks.  She took a Loss adjuster, chartered. Her pain continued.  She has had swelling, popping and medial pain with the feeling it will give way but has not.  She went to [facility] and was seen on October 21st.  X-rays were done and were negative except for slight DJD.  She continues to hurt. She has been on Mobic and also tried Aleve with little help.  She uses a brace which helps.  She has no new trauma.  She is concerned it is still hurting.  She has no redness, no numbness.  She has chronic lower back pain.   Review of Systems  Constitutional: Positive for activity change.  Musculoskeletal: Positive for arthralgias, back pain, gait problem and joint swelling.  Psychiatric/Behavioral: The patient is nervous/anxious.   All other systems reviewed and are negative.  For Review of Systems, all other systems reviewed and are negative.  The following is a summary of the past history medically, past history surgically, known current medicines, social history and family history.  This information is gathered electronically by the computer from prior information and documentation.  I review this each visit and have found including this information at this point in the chart is beneficial and informative.   Past Medical History:  Diagnosis Date  . Anxiety   . Bell's palsy 01/08/2011  . Chronic back pain   . Depression   . Diabetes mellitus   . GERD (gastroesophageal reflux disease)   . Hepatomegaly   . Hypertension   . Melanoma [facility]) 2009   Dr Nevada Crane, Stage 2, required only surgery  . NAFLD (nonalcoholic fatty liver disease)     Past Surgical History:  Procedure Laterality Date  . CESAREAN SECTION    . COLONOSCOPY  03/09/2011   Dr Oneida Alar diverticulosis, internal and external hemorrhoids  . ESOPHAGOGASTRODUODENOSCOPY  03/09/2011   Esophageal stricture  dilated to 16 MM savory, NSAID- induced gastritis and duodenitis  . LEG SURGERY     plates/screws/hx fx-right leg  . MELANOMA EXCISION     right knee  . SAVORY DILATION  03/09/2011    Current Outpatient Medications on File Prior to Visit  Medication Sig Dispense Refill  . acetaminophen (TYLENOL) 500 MG tablet Take 1,000 mg by mouth every 6 (six) hours as needed for mild pain or moderate pain.    Marland Kitchen aspirin 325 MG tablet Take 325 mg by mouth once as needed for mild pain or moderate pain.    Marland Kitchen co-enzyme Q-10 30 MG capsule Take 30 mg by mouth daily.    . dorzolamide (TRUSOPT) 2 % ophthalmic solution Place 1 drop into the right eye 2 (two) times daily.    . furosemide (LASIX) 20 MG tablet Take 20 mg by mouth every morning.    Marland Kitchen lisinopril (PRINIVIL,ZESTRIL) 10 MG tablet Take 10 mg by mouth every morning.   0  . meloxicam (MOBIC) 15 MG tablet Take 15 mg by mouth every morning.    . metFORMIN (GLUCOPHAGE-XR) 500 MG 24 hr tablet Take 1,000 mg by mouth 2 (two) times daily.     . Multiple Vitamins-Minerals (MULTI-BETIC DIABETES) TABS Take 2 tablets by mouth daily.    . naproxen sodium (ALEVE) 220 MG tablet Take 220 mg by mouth 2 (two) times daily as needed (for back pain).     Marland Kitchen  NON FORMULARY Take 1 capsule by mouth 3 (three) times daily.    . pantoprazole (PROTONIX) 40 MG tablet TAKE ONE TABLET BY MOUTH ONCE DAILY (Patient taking differently: Take 40 mg by mouth every morning. ) 30 tablet 11  . simvastatin (ZOCOR) 40 MG tablet Take 40 mg by mouth every evening.    . sitaGLIPtin (JANUVIA) 100 MG tablet Take 100 mg by mouth every morning.      No current facility-administered medications on file prior to visit.     Social History   Socioeconomic History  . Marital status: Married    Spouse name: Not on file  . Number of children: 1  . Years of education: Not on file  . Highest education level: Not on file  Occupational History  . 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 Financial resource strain: Patient refused  . Food insecurity:    Worry: Patient refused    Inability: Patient refused  . Transportation needs:    Medical: Patient refused    Non-medical: Patient refused  Tobacco Use  . Smoking status: Former Smoker    Packs/day: 0.50    Years: 11.00    Pack years: 5.50    Types: Cigarettes    Last attempt to quit: 03/03/1993    Years since quitting: 25.2  . Smokeless tobacco: Never Used  Substance and Sexual Activity  . Alcohol use: Yes    Comment: wine 2-3 glasses per weekend  . Drug use: No  . Sexual activity: Yes    Birth control/protection: Post-menopausal  Lifestyle  . Physical activity:    Days per week: Patient refused    Minutes per session: Patient refused  . Stress: Patient refused  Relationships  . Social connections:    Talks on phone: Patient refused    Gets together: Patient refused    Attends religious service: Patient refused    Active member of club or organization: Patient refused    Attends meetings of clubs or organizations: Patient refused    Relationship status: Patient refused  . Intimate partner violence:    Fear of current or ex partner: Patient refused    Emotionally abused: Patient refused    Physically abused: Patient refused    Forced sexual activity: Patient refused  Other Topics Concern  . Not on file  Social History Narrative  . Not on file    Family History  Problem Relation Age of Onset  . Heart disease Mother   . Heart disease Maternal Grandmother   . Heart disease Maternal Grandfather   . COPD Father   . Bladder Cancer Father   . Anesthesia problems Neg Hx   . Hypotension Neg Hx   . Malignant hyperthermia Neg Hx   . Pseudochol deficiency Neg Hx   . Colon cancer Neg Hx     BP (!) 150/87   Pulse 82   Ht 5\' 2"  (1.575 m)   Wt 221 lb (100.2 kg)   BMI 40.42 kg/m   Body mass index is 40.42 kg/m.      Objective:   Physical Exam  Constitutional:  She is oriented to person, place, and time. She appears well-developed and well-nourished.  HENT:  Head: Normocephalic and atraumatic.  Eyes: Pupils are equal, round, and reactive to light. Conjunctivae and EOM are normal.  Neck: Normal range of motion. Neck supple.  Cardiovascular: Normal rate, regular rhythm and intact distal pulses.  Pulmonary/Chest:  Effort normal.  Abdominal: Soft.  Musculoskeletal:       Right knee: She exhibits decreased range of motion, swelling and effusion. Tenderness found. Medial joint line tenderness noted.       Legs: Neurological: She is alert and oriented to person, place, and time. She has normal reflexes. She displays normal reflexes. No cranial nerve deficit. She exhibits normal muscle tone. Coordination normal.  Skin: Skin is warm and dry.  Psychiatric: She has a normal mood and affect. Her behavior is normal. Judgment and thought content normal.    I have reviewed the notes from [facility], the x-rays and report.      Assessment & Plan:   Encounter Diagnosis  Name Primary?  . Chronic pain of right knee Yes   I am concerned about a medial meniscus injury.  She has to wait six weeks from when first seen before her insurance will pay for MRI.  She has to wait another three weeks.  PROCEDURE NOTE:  The patient requests injections of the right knee , verbal consent was obtained.  The right knee was prepped appropriately after time out was performed.   Sterile technique was observed and injection of 1 cc of Depo-Medrol 40 mg with several cc's of plain xylocaine. Anesthesia was provided by ethyl chloride and a 20-gauge needle was used to inject the knee area. The injection was tolerated well.  A band aid dressing was applied.  The patient was advised to apply ice later today and tomorrow to the injection sight as needed.  She is to continue her knee sleeve and present medicine.  I will see in three weeks. Order MRI then if not improved. Call  if any problem.  Precautions discussed.   Electronically Signed Sanjuana Kava, MD 11/13/20192:32 PM

## 2018-06-08 NOTE — Patient Instructions (Signed)

## 2018-06-13 DIAGNOSIS — Z0001 Encounter for general adult medical examination with abnormal findings: Secondary | ICD-10-CM | POA: Diagnosis not present

## 2018-06-13 DIAGNOSIS — F419 Anxiety disorder, unspecified: Secondary | ICD-10-CM | POA: Diagnosis not present

## 2018-06-13 DIAGNOSIS — Z6839 Body mass index (BMI) 39.0-39.9, adult: Secondary | ICD-10-CM | POA: Diagnosis not present

## 2018-06-13 DIAGNOSIS — Z1389 Encounter for screening for other disorder: Secondary | ICD-10-CM | POA: Diagnosis not present

## 2018-06-13 DIAGNOSIS — R590 Localized enlarged lymph nodes: Secondary | ICD-10-CM | POA: Diagnosis not present

## 2018-06-17 DIAGNOSIS — R590 Localized enlarged lymph nodes: Secondary | ICD-10-CM | POA: Diagnosis not present

## 2018-06-17 DIAGNOSIS — Z0001 Encounter for general adult medical examination with abnormal findings: Secondary | ICD-10-CM | POA: Diagnosis not present

## 2018-06-17 DIAGNOSIS — Z6839 Body mass index (BMI) 39.0-39.9, adult: Secondary | ICD-10-CM | POA: Diagnosis not present

## 2018-06-20 ENCOUNTER — Encounter (HOSPITAL_COMMUNITY): Payer: Self-pay | Admitting: Hematology

## 2018-06-20 ENCOUNTER — Inpatient Hospital Stay (HOSPITAL_COMMUNITY): Payer: BLUE CROSS/BLUE SHIELD

## 2018-06-20 ENCOUNTER — Other Ambulatory Visit: Payer: Self-pay

## 2018-06-20 ENCOUNTER — Inpatient Hospital Stay (HOSPITAL_COMMUNITY): Payer: BLUE CROSS/BLUE SHIELD | Attending: Hematology | Admitting: Hematology

## 2018-06-20 VITALS — BP 131/71 | HR 64 | Temp 98.2°F | Resp 20 | Ht 62.0 in | Wt 221.0 lb

## 2018-06-20 DIAGNOSIS — Z79899 Other long term (current) drug therapy: Secondary | ICD-10-CM | POA: Diagnosis not present

## 2018-06-20 DIAGNOSIS — K760 Fatty (change of) liver, not elsewhere classified: Secondary | ICD-10-CM | POA: Insufficient documentation

## 2018-06-20 DIAGNOSIS — R079 Chest pain, unspecified: Secondary | ICD-10-CM | POA: Insufficient documentation

## 2018-06-20 DIAGNOSIS — K219 Gastro-esophageal reflux disease without esophagitis: Secondary | ICD-10-CM | POA: Insufficient documentation

## 2018-06-20 DIAGNOSIS — Z8052 Family history of malignant neoplasm of bladder: Secondary | ICD-10-CM | POA: Diagnosis not present

## 2018-06-20 DIAGNOSIS — Z7982 Long term (current) use of aspirin: Secondary | ICD-10-CM | POA: Diagnosis not present

## 2018-06-20 DIAGNOSIS — E119 Type 2 diabetes mellitus without complications: Secondary | ICD-10-CM | POA: Diagnosis not present

## 2018-06-20 DIAGNOSIS — F419 Anxiety disorder, unspecified: Secondary | ICD-10-CM | POA: Insufficient documentation

## 2018-06-20 DIAGNOSIS — Z803 Family history of malignant neoplasm of breast: Secondary | ICD-10-CM | POA: Diagnosis not present

## 2018-06-20 DIAGNOSIS — Z87891 Personal history of nicotine dependence: Secondary | ICD-10-CM | POA: Insufficient documentation

## 2018-06-20 DIAGNOSIS — Z7984 Long term (current) use of oral hypoglycemic drugs: Secondary | ICD-10-CM | POA: Diagnosis not present

## 2018-06-20 DIAGNOSIS — R590 Localized enlarged lymph nodes: Secondary | ICD-10-CM | POA: Insufficient documentation

## 2018-06-20 DIAGNOSIS — Z791 Long term (current) use of non-steroidal anti-inflammatories (NSAID): Secondary | ICD-10-CM | POA: Diagnosis not present

## 2018-06-20 DIAGNOSIS — Z85820 Personal history of malignant melanoma of skin: Secondary | ICD-10-CM | POA: Diagnosis not present

## 2018-06-20 DIAGNOSIS — I10 Essential (primary) hypertension: Secondary | ICD-10-CM | POA: Insufficient documentation

## 2018-06-20 LAB — LACTATE DEHYDROGENASE: LDH: 133 U/L (ref 98–192)

## 2018-06-20 NOTE — Patient Instructions (Signed)
[facility] Discharge Instructions     Thank you for choosing [facility] at [facility] to provide your oncology and hematology care.  To afford each patient quality time with our provider, please arrive at least 15 minutes before your scheduled appointment time.   If you have a lab appointment with the Cancer Center please come in thru the  Main Entrance and check in at the main information desk  You need to re-schedule your appointment should you arrive 10 or more minutes late.  We strive to give you quality time with our providers, and arriving late affects you and other patients whose appointments are after yours.  Also, if you no show three or more times for appointments you may be dismissed from the clinic at the providers discretion.     Again, thank you for choosing [facility].  Our hope is that these requests will decrease the amount of time that you wait before being seen by our physicians.       _____________________________________________________________  Should you have questions after your visit to [facility], please contact our office at [phone] between the hours of 8:00 a.m. and 4:30 p.m.  Voicemails left after 4:00 p.m. will not be returned until the following business day.  For prescription refill requests, have your pharmacy contact our office and allow 72 hours.    Cancer Center Support Programs:   > Cancer Support Group  2nd Tuesday of the month 1pm-2pm, Journey Room    

## 2018-06-20 NOTE — Assessment & Plan Note (Signed)
1.  Mediastinal and hilar adenopathy: - History of early-stage melanoma, resected with wide excision in 2012 by Dr. Nevada Crane - Patient was followed by Dr. Tressie Stalker and discharged from the clinic.  She had fatty liver and some pericaval adenopathy at that time. - She reports having chest pain on and off for over a year since her husband passed away.  After the chest pain radiated to her neck and shoulder, she went to the ER on 06/06/2018. -CT scan of the chest PE protocol was done which showed multiple mediastinal lymph nodes in the prevascular, paratracheal, AP window.  Largest measures 19 mm.  Subcarinal lymph node measures 12 mm.  Borderline enlarged nodes of the bilateral hilar region.  No pulmonary findings. - I have also reviewed CT of the abdomen and pelvis from 03/26/2011 which showed a pericaval lymph node measuring 0.8 cm, higher to kill node measuring 1.1 cm. -Denies any fevers, night sweats or weight loss in the last 6 months. - Given her history of melanoma, I have recommended doing a whole-body PET CT scan.  We will schedule it.  We will see her back after the PET CT scan. -I have reviewed her labs from recent hospitalization which are grossly unremarkable.  I will order LDH level.

## 2018-06-20 NOTE — Progress Notes (Signed)
[facility] NOTE  Patient Care Team: Redmond School, MD as PCP - General (Internal Medicine) Danie Binder, MD (Gastroenterology) Sharilyn Sites, MD (Family Medicine)  CHIEF COMPLAINTS/PURPOSE OF CONSULTATION:  Mediastinal and hilar adenopathy.  HISTORY OF PRESENTING ILLNESS:  Shelby Morgan 62 y.o. female is seen in consultation today for further work-up and management of adenopathy on recent CT scan.  She has been having chest pain on and off for the last 1 year, since her husband passed away.  As the pain has radiated to her neck and left shoulder for 2 to 3 days, she presented to the ER on 06/06/2018.  Cardiac etiology was ruled out.  CT scan of the chest with PE protocol showed multiple mediastinal lymph nodes involving prevascular, pretracheal, AP window nodes.  Largest node was measuring 1.9 cm.  Subcarinal node measures 1.2 cm.  Borderline enlarged lymph nodes in the bilateral hilar regions.  She denies any fevers, night sweats or weight loss in the last 6 months.  Denies any cough or hemoptysis.  She smoked one third of pack of cigarettes for close to 10 years in her 20s.  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  She is scheduled for a mammogram.  She had Pap smear on Friday. She reportedly had at least his melanoma resected from the skin of right lateral knee in 2012.  She has seen Dr. Abran Duke and a CT of the abdomen and pelvis was done in 2012.  This also showed some pericaval adenopathy at that time which improved on the subsequent scan.  Energy levels and appetite are 100%. Family history significant for maternal grandfather had bladder cancer.  Maternal aunt had breast cancer.  MEDICAL HISTORY:  Past Medical History:  Diagnosis Date  . Anxiety   . Bell's palsy 01/08/2011  . Chronic back pain   . Depression   . Diabetes mellitus   . GERD (gastroesophageal  reflux disease)   . Hepatomegaly   . Hypertension   . Melanoma [facility]) 2009   Dr Nevada Crane, Stage 2, required only surgery  . NAFLD (nonalcoholic fatty liver disease)   . Skin cancer     SURGICAL HISTORY: Past Surgical History:  Procedure Laterality Date  . CESAREAN SECTION    . COLONOSCOPY  03/09/2011   Dr Oneida Alar diverticulosis, internal and external hemorrhoids  . ESOPHAGOGASTRODUODENOSCOPY  03/09/2011   Esophageal stricture dilated to 16 MM savory, NSAID- induced gastritis and duodenitis  . LEG SURGERY     plates/screws/hx fx-right leg  . MELANOMA EXCISION     right knee  . SAVORY DILATION  03/09/2011    SOCIAL HISTORY: Social History   Socioeconomic History  . Marital status: Widowed    Spouse name: Not on file  . Number of children: 1  . Years of education: Not on file  . Highest education level: Not on file  Occupational History  . 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 Financial resource strain: Patient refused  . Food insecurity:    Worry: Patient refused    Inability: Patient refused  . Transportation needs:    Medical: Patient refused    Non-medical: Patient refused  Tobacco Use  . Smoking status: Former Smoker    Packs/day: 0.50    Years: 11.00    Pack years: 5.50  Types: Cigarettes    Last attempt to quit: 03/03/1993    Years since quitting: 25.3  . Smokeless tobacco: Never Used  Substance and Sexual Activity  . Alcohol use: Yes    Comment: wine 2-3 glasses per weekend  . Drug use: No  . Sexual activity: Yes    Birth control/protection: Post-menopausal  Lifestyle  . Physical activity:    Days per week: Patient refused    Minutes per session: Patient refused  . Stress: Patient refused  Relationships  . Social connections:    Talks on phone: Patient refused    Gets together: Patient refused    Attends religious service: Patient refused    Active member of club or organization: Patient  refused    Attends meetings of clubs or organizations: Patient refused    Relationship status: Patient refused  . Intimate partner violence:    Fear of current or ex partner: Patient refused    Emotionally abused: Patient refused    Physically abused: Patient refused    Forced sexual activity: Patient refused  Other Topics Concern  . Not on file  Social History Narrative  . Not on file    FAMILY HISTORY: Family History  Problem Relation Age of Onset  . Heart disease Mother   . Heart disease Maternal Grandmother   . Heart disease Maternal Grandfather   . COPD Father   . Bladder Cancer Father   . Anesthesia problems Neg Hx   . Hypotension Neg Hx   . Malignant hyperthermia Neg Hx   . Pseudochol deficiency Neg Hx   . Colon cancer Neg Hx     ALLERGIES:  is allergic to codeine.  MEDICATIONS:  Current Outpatient Medications  Medication Sig Dispense Refill  . aspirin EC 81 MG tablet Take 81 mg by mouth daily.    . busPIRone (BUSPAR) 5 MG tablet   5  . co-enzyme Q-10 30 MG capsule Take 30 mg by mouth daily.    . dorzolamide (TRUSOPT) 2 % ophthalmic solution Place 1 drop into the right eye 2 (two) times daily.    . furosemide (LASIX) 20 MG tablet Take 20 mg by mouth every morning.    Marland Kitchen lisinopril (PRINIVIL,ZESTRIL) 10 MG tablet Take 10 mg by mouth every morning.   0  . meloxicam (MOBIC) 15 MG tablet Take 15 mg by mouth every morning.    . metFORMIN (GLUCOPHAGE-XR) 500 MG 24 hr tablet Take 1,000 mg by mouth 2 (two) times daily.     . Multiple Vitamins-Minerals (MULTI-BETIC DIABETES) TABS Take 2 tablets by mouth daily.    . naproxen sodium (ALEVE) 220 MG tablet Take 220 mg by mouth 2 (two) times daily as needed (for back pain).     . NON FORMULARY Take 1 capsule by mouth 3 (three) times daily.    . pantoprazole (PROTONIX) 40 MG tablet TAKE ONE TABLET BY MOUTH ONCE DAILY (Patient taking differently: Take 40 mg by mouth every morning. ) 30 tablet 11  . simvastatin (ZOCOR) 40 MG tablet  Take 40 mg by mouth every evening.    . sitaGLIPtin (JANUVIA) 100 MG tablet Take 100 mg by mouth every morning.     . traZODone (DESYREL) 100 MG tablet   5   No current facility-administered medications for this visit.     REVIEW OF SYSTEMS:   Constitutional: Denies fevers, chills or abnormal night sweats Eyes: Denies blurriness of vision, double vision or watery eyes Ears, nose, mouth, throat, and face: Denies mucositis  or sore throat Respiratory: Denies cough, dyspnea or wheezes Cardiovascular: Denies palpitation, chest discomfort or lower extremity swelling Gastrointestinal:  Denies nausea, heartburn or change in bowel habits Skin: Denies abnormal skin rashes Lymphatics: Denies new lymphadenopathy or easy bruising Neurological: Numbness in the toes present. Behavioral/Psych: Mood is stable, no new changes  All other systems were reviewed with the patient and are negative.  PHYSICAL EXAMINATION: ECOG PERFORMANCE STATUS: 0 - Asymptomatic  Vitals:   06/20/18 1327  BP: 131/71  Pulse: 64  Resp: 20  Temp: 98.2 F (36.8 C)  SpO2: 98%   Filed Weights   06/20/18 1327  Weight: 221 lb (100.2 kg)    GENERAL:alert, no distress and comfortable SKIN: skin color, texture, turgor are normal, no rashes or significant lesions EYES: normal, conjunctiva are pink and non-injected, sclera clear OROPHARYNX:no exudate, no erythema and lips, buccal mucosa, and tongue normal  NECK: supple, thyroid normal size, non-tender, without nodularity LYMPH:  no palpable lymphadenopathy in the cervical, axillary or inguinal LUNGS: clear to auscultation and percussion with normal breathing effort HEART: regular rate & rhythm and no murmurs and no lower extremity edema ABDOMEN:abdomen soft, non-tender and normal bowel sounds Musculoskeletal:no cyanosis of digits and no clubbing  PSYCH: alert & oriented x 3 with fluent speech NEURO: no focal motor/sensory deficits  LABORATORY DATA:  I have reviewed the  data as listed Lab Results  Component Value Date   WBC 9.1 06/06/2018   HGB 11.9 (L) 06/06/2018   HCT 38.0 06/06/2018   MCV 85.6 06/06/2018   PLT 336 06/06/2018     Chemistry      Component Value Date/Time   NA 137 06/06/2018 1530   K 3.4 (L) 06/06/2018 1530   CL 102 06/06/2018 1530   CO2 27 06/06/2018 1530   BUN 20 06/06/2018 1530   CREATININE 0.80 06/06/2018 1530      Component Value Date/Time   CALCIUM 9.1 06/06/2018 1530   ALKPHOS 45 06/06/2018 1530   ALKPHOS 55 03/06/2013   AST 21 06/06/2018 1530   AST 22 03/06/2013   ALT 21 06/06/2018 1530   BILITOT 0.9 06/06/2018 1530   BILITOT 0.4 03/06/2013       RADIOGRAPHIC STUDIES: I have personally reviewed the radiological images as listed and agreed with the findings in the report. Dg Chest 2 View  Result Date: 06/06/2018 CLINICAL DATA:  Chest and left arm pain for the past month. History of diabetes and hypertension. Former smoker. EXAM: CHEST - 2 VIEW COMPARISON:  PA and lateral chest x-ray of January 08, 2011 FINDINGS: The lungs are adequately inflated and clear. There is no pneumothorax or pleural effusion. The heart and pulmonary vascularity are normal. The mediastinum is normal in width. There is tortuosity of the descending thoracic aorta. There is faint calcification in the wall of the aortic arch. There is multilevel degenerative disc disease of the thoracic spine. IMPRESSION: There is no pneumonia, CHF, nor other acute cardiopulmonary abnormality. Thoracic aortic atherosclerosis. Electronically Signed   By: David  Martinique M.D.   On: 06/06/2018 15:07   Ct Angio Chest Pe W And/or Wo Contrast  Result Date: 06/06/2018 CLINICAL DATA:  62 year old female with a history of positive D-dimer EXAM: CT ANGIOGRAPHY CHEST WITH CONTRAST TECHNIQUE: Multidetector CT imaging of the chest was performed using the standard protocol during bolus administration of intravenous contrast. Multiplanar CT image reconstructions and MIPs were  obtained to evaluate the vascular anatomy. CONTRAST:  121mL ISOVUE-370 IOPAMIDOL (ISOVUE-370) INJECTION 76% COMPARISON:  None. FINDINGS:  Cardiovascular: Heart: No cardiomegaly. No pericardial fluid/thickening. No significant coronary calcifications. Aorta: Unremarkable course, caliber, contour of the thoracic aorta. No aneurysm or dissection flap. No periaortic fluid. Pulmonary arteries: No central, lobar, segmental, or proximal subsegmental filling defects. Mediastinum/Nodes: Multiple mediastinal lymph nodes, with numerous small lymph nodes of the upper mediastinum, including prevascular, paratracheal, AP window nodes. There are enlarged nodes in the lowest paratracheal nodal stations, on the left measuring 19 mm, subcarinal measuring 12 mm. Borderline enlarged nodes of the bilateral hilar region. Unremarkable appearance of the thoracic esophagus. Unremarkable appearance of the thyroid. Lungs/Pleura: Central airways are clear. No pleural effusion. No confluent airspace disease. No pneumothorax. Upper Abdomen: No acute. Musculoskeletal: No acute displaced fracture. Degenerative changes of the spine. Review of the MIP images confirms the above findings. IMPRESSION: CT is negative for pulmonary embolism. No acute CT finding. Mediastinal and bilateral hilar adenopathy, without pulmonary findings to account for the changes. Referral for primary care/oncologic follow-up is recommended given the patient's history, with correlation with labs and potentially PET imaging. Electronically Signed   By: Corrie Mckusick D.O.   On: 06/06/2018 17:32   Nm Myocar Multi W/spect W/wall Motion / Ef  Result Date: 06/07/2018  There was no ST segment deviation noted during stress.  Defect 1: There is a medium defect of mild severity present in the mid inferior and apical inferior location. This is likely due to soft tissue attenuation.  The study is normal. No myocardial ischemia or scar.  This is a low risk study.  Nuclear stress  EF: 68%.     ASSESSMENT & PLAN:  Mediastinal adenopathy 1.  Mediastinal and hilar adenopathy: - History of early-stage melanoma, resected with wide excision in 2012 by Dr. Nevada Crane - Patient was followed by Dr. Tressie Stalker and discharged from the clinic.  She had fatty liver and some pericaval adenopathy at that time. - She reports having chest pain on and off for over a year since her husband passed away.  After the chest pain radiated to her neck and shoulder, she went to the ER on 06/06/2018. -CT scan of the chest PE protocol was done which showed multiple mediastinal lymph nodes in the prevascular, paratracheal, AP window.  Largest measures 19 mm.  Subcarinal lymph node measures 12 mm.  Borderline enlarged nodes of the bilateral hilar region.  No pulmonary findings. - I have also reviewed CT of the abdomen and pelvis from 03/26/2011 which showed a pericaval lymph node measuring 0.8 cm, higher to kill node measuring 1.1 cm. -Denies any fevers, night sweats or weight loss in the last 6 months. - Given her history of melanoma, I have recommended doing a whole-body PET CT scan.  We will schedule it.  We will see her back after the PET CT scan. -I have reviewed her labs from recent hospitalization which are grossly unremarkable.  I will order LDH level.  Orders Placed This Encounter  Procedures  . NM PET Image Initial (PI) Whole Body    Standing Status:   Future    Standing Expiration Date:   06/20/2019    Order Specific Question:   ** REASON FOR EXAM (FREE TEXT)    Answer:   history of melanoma resected 2012, recent CT showing chest adenopathy    Order Specific Question:   If indicated for the ordered procedure, I authorize the administration of a radiopharmaceutical per Radiology protocol    Answer:   Yes    Order Specific Question:   Preferred imaging location?  Answer:   [facility]    Order Specific Question:   Radiology Contrast Protocol - do NOT remove file path    Answer:    \\[email or web address]  . Lactate dehydrogenase    Standing Status:   Future    Number of Occurrences:   1    Standing Expiration Date:   06/20/2019    All questions were answered. The patient knows to call the clinic with any problems, questions or concerns.     Derek Jack, MD 06/20/2018 1:59 PM

## 2018-06-29 ENCOUNTER — Ambulatory Visit: Payer: BLUE CROSS/BLUE SHIELD | Admitting: Orthopaedic Surgery

## 2018-07-01 ENCOUNTER — Encounter (HOSPITAL_COMMUNITY): Payer: BLUE CROSS/BLUE SHIELD

## 2018-07-05 ENCOUNTER — Ambulatory Visit (HOSPITAL_COMMUNITY): Payer: BLUE CROSS/BLUE SHIELD | Admitting: Hematology

## 2018-07-05 ENCOUNTER — Other Ambulatory Visit (HOSPITAL_COMMUNITY): Payer: Self-pay | Admitting: Nurse Practitioner

## 2018-07-05 DIAGNOSIS — R590 Localized enlarged lymph nodes: Secondary | ICD-10-CM

## 2018-07-05 DIAGNOSIS — Z85820 Personal history of malignant melanoma of skin: Secondary | ICD-10-CM

## 2018-07-06 ENCOUNTER — Ambulatory Visit: Payer: BLUE CROSS/BLUE SHIELD | Admitting: Orthopaedic Surgery

## 2018-07-07 ENCOUNTER — Encounter (HOSPITAL_COMMUNITY): Payer: BLUE CROSS/BLUE SHIELD

## 2018-07-08 ENCOUNTER — Ambulatory Visit (HOSPITAL_COMMUNITY): Admission: RE | Admit: 2018-07-08 | Payer: BLUE CROSS/BLUE SHIELD | Source: Ambulatory Visit

## 2018-07-14 ENCOUNTER — Ambulatory Visit: Payer: BLUE CROSS/BLUE SHIELD | Admitting: Student

## 2018-07-28 ENCOUNTER — Other Ambulatory Visit (HOSPITAL_COMMUNITY): Payer: Self-pay | Admitting: Physician Assistant

## 2018-07-28 DIAGNOSIS — Z1231 Encounter for screening mammogram for malignant neoplasm of breast: Secondary | ICD-10-CM

## 2018-08-01 ENCOUNTER — Ambulatory Visit (HOSPITAL_COMMUNITY): Payer: BLUE CROSS/BLUE SHIELD | Admitting: Hematology

## 2018-08-05 ENCOUNTER — Encounter (HOSPITAL_COMMUNITY): Payer: Self-pay

## 2018-08-05 ENCOUNTER — Ambulatory Visit (HOSPITAL_COMMUNITY)
Admission: RE | Admit: 2018-08-05 | Discharge: 2018-08-05 | Disposition: A | Discharge status: Home / Self Care | Payer: BLUE CROSS/BLUE SHIELD | Source: Ambulatory Visit | Attending: Nurse Practitioner | Admitting: Nurse Practitioner

## 2018-08-05 ENCOUNTER — Ambulatory Visit (HOSPITAL_COMMUNITY)
Admission: RE | Admit: 2018-08-05 | Discharge: 2018-08-05 | Disposition: A | Discharge status: Home / Self Care | Payer: BLUE CROSS/BLUE SHIELD | Source: Ambulatory Visit | Attending: Physician Assistant | Admitting: Physician Assistant

## 2018-08-05 DIAGNOSIS — Z1231 Encounter for screening mammogram for malignant neoplasm of breast: Secondary | ICD-10-CM | POA: Diagnosis not present

## 2018-08-05 DIAGNOSIS — R590 Localized enlarged lymph nodes: Secondary | ICD-10-CM | POA: Diagnosis not present

## 2018-08-05 DIAGNOSIS — Z85820 Personal history of malignant melanoma of skin: Secondary | ICD-10-CM | POA: Diagnosis not present

## 2018-08-05 DIAGNOSIS — K5730 Diverticulosis of large intestine without perforation or abscess without bleeding: Secondary | ICD-10-CM | POA: Diagnosis not present

## 2018-08-05 LAB — POCT I-STAT CREATININE: CREATININE: 1.2 mg/dL — AB (ref 0.44–1.00)

## 2018-08-05 MED ORDER — IOPAMIDOL (ISOVUE-300) INJECTION 61%
100.0000 mL | Freq: Once | INTRAVENOUS | Status: AC | PRN
  Administered 2018-08-05: 100 mL via INTRAVENOUS

## 2018-08-11 ENCOUNTER — Inpatient Hospital Stay (HOSPITAL_COMMUNITY): Payer: BLUE CROSS/BLUE SHIELD | Attending: Hematology | Admitting: Hematology

## 2018-08-11 ENCOUNTER — Encounter (HOSPITAL_COMMUNITY): Payer: Self-pay | Admitting: Hematology

## 2018-08-11 ENCOUNTER — Other Ambulatory Visit: Payer: Self-pay

## 2018-08-11 VITALS — BP 131/74 | HR 65 | Temp 98.4°F | Resp 18 | Wt 221.4 lb

## 2018-08-11 DIAGNOSIS — E119 Type 2 diabetes mellitus without complications: Secondary | ICD-10-CM

## 2018-08-11 DIAGNOSIS — Z85820 Personal history of malignant melanoma of skin: Secondary | ICD-10-CM

## 2018-08-11 DIAGNOSIS — Z87891 Personal history of nicotine dependence: Secondary | ICD-10-CM | POA: Diagnosis not present

## 2018-08-11 DIAGNOSIS — I10 Essential (primary) hypertension: Secondary | ICD-10-CM | POA: Diagnosis not present

## 2018-08-11 DIAGNOSIS — Z7984 Long term (current) use of oral hypoglycemic drugs: Secondary | ICD-10-CM | POA: Diagnosis not present

## 2018-08-11 DIAGNOSIS — Z7982 Long term (current) use of aspirin: Secondary | ICD-10-CM | POA: Diagnosis not present

## 2018-08-11 DIAGNOSIS — R590 Localized enlarged lymph nodes: Secondary | ICD-10-CM | POA: Diagnosis not present

## 2018-08-11 DIAGNOSIS — Z79899 Other long term (current) drug therapy: Secondary | ICD-10-CM

## 2018-08-11 NOTE — Progress Notes (Signed)
Shelby Morgan, [place] [id number]   CLINIC:  Medical Oncology/Hematology  PCP:  Cory Munch, PA-C [place] [address] [phone]   REASON FOR VISIT: Follow-up for mediastinal and hilar adenopathy.  CURRENT THERAPY: observation   INTERVAL HISTORY:  Shelby Morgan 63 y.o. female returns for routine follow-up for mediastinal and hilar adenopathy. Shelby Morgan is still having right sided pain. Shelby Morgan still has occasional chest wall pain that comes and goes. Denies any nausea, vomiting, or diarrhea. Denies any new pains. Had not noticed any recent bleeding such as epistaxis, hematuria or hematochezia. Denies recent chest pain on exertion, shortness of breath on minimal exertion, pre-syncopal episodes, or palpitations. Denies any numbness or tingling in hands or feet. Denies any recent fevers, infections, or recent hospitalizations. Shelby Morgan reports appetite at 100% and energy level at 100%.   REVIEW OF SYSTEMS:  Review of Systems  Musculoskeletal: Positive for flank pain.  All other systems reviewed and are negative.    PAST MEDICAL/SURGICAL HISTORY:  Past Medical History:  Diagnosis Date  . Anxiety   . Bell's palsy 01/08/2011  . Chronic back pain   . Depression   . Diabetes mellitus   . GERD (gastroesophageal reflux disease)   . Hepatomegaly   . Hypertension   . Melanoma [facility]) 2009   Dr Nevada Crane, Stage 2, required only surgery  . NAFLD (nonalcoholic fatty liver disease)   . Skin cancer    Past Surgical History:  Procedure Laterality Date  . CESAREAN SECTION    . COLONOSCOPY  03/09/2011   Dr Oneida Alar diverticulosis, internal and external hemorrhoids  . ESOPHAGOGASTRODUODENOSCOPY  03/09/2011   Esophageal stricture dilated to 16 MM savory, NSAID- induced gastritis and duodenitis  . LEG SURGERY     plates/screws/hx fx-right leg  . MELANOMA EXCISION     right knee  . SAVORY DILATION  03/09/2011     SOCIAL HISTORY:  Social  History   Socioeconomic History  . Marital status: Widowed    Spouse name: Not on file  . Number of children: 1  . Years of education: Not on file  . Highest education level: Not on file  Occupational History  . 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 Financial resource strain: Shelby Morgan refused  . Food insecurity:    Worry: Shelby Morgan refused    Inability: Shelby Morgan refused  . Transportation needs:    Medical: Shelby Morgan refused    Non-medical: Shelby Morgan refused  Tobacco Use  . Smoking status: Former Smoker    Packs/day: 0.50    Years: 11.00    Pack years: 5.50    Types: Cigarettes    Last attempt to quit: 03/03/1993    Years since quitting: 25.4  . Smokeless tobacco: Never Used  Substance and Sexual Activity  . Alcohol use: Yes    Comment: wine 2-3 glasses per weekend  . Drug use: No  . Sexual activity: Yes    Birth control/protection: Post-menopausal  Lifestyle  . Physical activity:    Days per week: Shelby Morgan refused    Minutes per session: Shelby Morgan refused  . Stress: Shelby Morgan refused  Relationships  . Social connections:    Talks on phone: Shelby Morgan refused    Gets together: Shelby Morgan refused    Attends religious service: Shelby Morgan refused    Active member of club or organization: Shelby Morgan refused    Attends meetings of clubs or organizations:  Shelby Morgan refused    Relationship status: Shelby Morgan refused  . Intimate partner violence:    Fear of current or ex partner: Shelby Morgan refused    Emotionally abused: Shelby Morgan refused    Physically abused: Shelby Morgan refused    Forced sexual activity: Shelby Morgan refused  Other Topics Concern  . Not on file  Social History Narrative  . Not on file    FAMILY HISTORY:  Family History  Problem Relation Age of Onset  . Heart disease Mother   . Heart disease Maternal Grandmother   . Heart disease Maternal Grandfather   . COPD Father   . Bladder Cancer Father   . Anesthesia problems Neg Hx   .  Hypotension Neg Hx   . Malignant hyperthermia Neg Hx   . Pseudochol deficiency Neg Hx   . Colon cancer Neg Hx     CURRENT MEDICATIONS:  Outpatient Encounter Medications as of 08/11/2018  Medication Sig  . aspirin EC 81 MG tablet Take 81 mg by mouth daily.  . busPIRone (BUSPAR) 5 MG tablet   . dorzolamide (TRUSOPT) 2 % ophthalmic solution Place 1 drop into the right eye 2 (two) times daily.  . furosemide (LASIX) 20 MG tablet Take 20 mg by mouth every morning.  Marland Kitchen lisinopril (PRINIVIL,ZESTRIL) 10 MG tablet Take 10 mg by mouth every morning.   . meloxicam (MOBIC) 15 MG tablet Take 15 mg by mouth every morning.  . metFORMIN (GLUCOPHAGE-XR) 500 MG 24 hr tablet Take 1,000 mg by mouth 2 (two) times daily.   . Multiple Vitamins-Minerals (MULTI-BETIC DIABETES) TABS Take 2 tablets by mouth daily.  . pantoprazole (PROTONIX) 40 MG tablet TAKE ONE TABLET BY MOUTH ONCE DAILY (Shelby Morgan taking differently: Take 40 mg by mouth every morning. )  . simvastatin (ZOCOR) 40 MG tablet Take 40 mg by mouth every evening.  . sitaGLIPtin (JANUVIA) 100 MG tablet Take 100 mg by mouth every morning.   . traZODone (DESYREL) 100 MG tablet   . naproxen sodium (ALEVE) 220 MG tablet Take 220 mg by mouth 2 (two) times daily as needed (for back pain).   . [DISCONTINUED] co-enzyme Q-10 30 MG capsule Take 30 mg by mouth daily.  . [DISCONTINUED] NON FORMULARY Take 1 capsule by mouth 3 (three) times daily.   No facility-administered encounter medications on file as of 08/11/2018.     ALLERGIES:  Allergies  Allergen Reactions  . Codeine Nausea And Vomiting     PHYSICAL EXAM:  ECOG Performance status: 1  Vitals:   08/11/18 1100  BP: 131/74  Pulse: 65  Resp: 18  Temp: 98.4 F (36.9 C)  SpO2: 99%   Filed Weights   08/11/18 1100  Weight: 221 lb 6 oz (100.4 kg)    Physical Exam Constitutional:      Appearance: Normal appearance. Shelby Morgan is normal weight.  Musculoskeletal: Normal range of motion.  Skin:    General:  Skin is warm and dry.  Neurological:     Mental Status: Shelby Morgan is alert and oriented to person, place, and time. Mental status is at baseline.  Psychiatric:        Mood and Affect: Mood normal.        Behavior: Behavior normal.        Thought Content: Thought content normal.        Judgment: Judgment normal.      LABORATORY DATA:  I have reviewed the labs as listed.  CBC    Component Value Date/Time   WBC 9.1 06/06/2018 1530  RBC 4.44 06/06/2018 1530   HGB 11.9 (L) 06/06/2018 1530   HCT 38.0 06/06/2018 1530   PLT 336 06/06/2018 1530   MCV 85.6 06/06/2018 1530   MCH 26.8 06/06/2018 1530   MCHC 31.3 06/06/2018 1530   RDW 13.7 06/06/2018 1530   LYMPHSABS 1.1 05/01/2012 0114   MONOABS 0.5 05/01/2012 0114   EOSABS 0.0 05/01/2012 0114   BASOSABS 0.0 05/01/2012 0114   CMP Latest Ref Rng & Units 08/05/2018 06/06/2018 03/06/2013  Glucose 70 - 99 mg/dL - 102(H) -  BUN 8 - 23 mg/dL - 20 -  Creatinine 0.44 - 1.00 mg/dL 1.20(H) 0.80 -  Sodium 135 - 145 mmol/L - 137 -  Potassium 3.5 - 5.1 mmol/L - 3.4(L) -  Chloride 98 - 111 mmol/L - 102 -  CO2 22 - 32 mmol/L - 27 -  Calcium 8.9 - 10.3 mg/dL - 9.1 -  Total Protein 6.5 - 8.1 g/dL - 7.9 7.0  Total Bilirubin 0.3 - 1.2 mg/dL - 0.9 0.4  Alkaline Phos 38 - 126 U/L - 45 55  AST 15 - 41 U/L - 21 22  ALT 0 - 44 U/L - 21 29       DIAGNOSTIC IMAGING:  I have independently reviewed the scans and discussed with the Shelby Morgan.   I have reviewed Francene Finders, NP's note and agree with the documentation.  I personally performed a face-to-face visit, made revisions and my assessment and plan is as follows.    ASSESSMENT & PLAN:   History of melanoma 1.  Mediastinal and hilar adenopathy: - History of early-stage melanoma, resected with wide excision in 2012 by Dr. Nevada Crane - Shelby Morgan was followed by Dr. Tressie Stalker and discharged from the clinic.  Shelby Morgan had fatty liver and some pericaval adenopathy at that time. - Shelby Morgan reports having chest pain on  and off for over a year since her husband passed away.  After the chest pain radiated to her neck and shoulder, Shelby Morgan went to the ER on 06/06/2018. -CT scan of the chest PE protocol was done which showed multiple mediastinal lymph nodes in the prevascular, paratracheal, AP window.  Largest measures 19 mm.  Subcarinal lymph node measures 12 mm.  Borderline enlarged nodes of the bilateral hilar region.  No pulmonary findings. - CT of the abdomen and pelvis on 03/26/2011 showed pericaval lymph node measuring 0.8 cm.  -Denies any fevers, night sweats or weight loss in the last 6 months.  Denies any new onset pains.  Shelby Morgan does report some tenderness in the right lower lateral rib area. -We have ordered a PET scan, however her insurance denied it. -We discussed the results of the CT of the abdomen and pelvis dated 08/05/2018 which did not show suspicious metastatic disease in the abdomen or pelvis.  Mild aortocaval adenopathy stable since 2012.  Mild subcarinal adenopathy stable since 06/06/2018 chest CT, measuring 12 mm. - An LDH level was normal. -I have recommended a repeat CT of the chest to follow-up on hilar adenopathy in the next 2 months.  We will see her back after the CT scan. -Shelby Morgan was told to follow-up with her dermatologist.      Orders placed this encounter:  Orders Placed This Encounter  Procedures  . CT Chest W Contrast  . Lactate dehydrogenase  . CBC with Differential/Platelet  . Comprehensive metabolic panel      Derek Jack, MD [place] [phone]

## 2018-08-11 NOTE — Patient Instructions (Addendum)
[facility] Discharge Instructions  Follow up in 2 months with labs and scans   Thank you for choosing [facility] at [facility] to provide your oncology and hematology care.  To afford each patient quality time with our provider, please arrive at least 15 minutes before your scheduled appointment time.   If you have a lab appointment with the Cancer Center please come in thru the  Main Entrance and check in at the main information desk  You need to re-schedule your appointment should you arrive 10 or more minutes late.  We strive to give you quality time with our providers, and arriving late affects you and other patients whose appointments are after yours.  Also, if you no show three or more times for appointments you may be dismissed from the clinic at the providers discretion.     Again, thank you for choosing [facility].  Our hope is that these requests will decrease the amount of time that you wait before being seen by our physicians.       _____________________________________________________________  Should you have questions after your visit to [facility], please contact our office at [phone] between the hours of 8:00 a.m. and 4:30 p.m.  Voicemails left after 4:00 p.m. will not be returned until the following business day.  For prescription refill requests, have your pharmacy contact our office and allow 72 hours.    Cancer Center Support Programs:   > Cancer Support Group  2nd Tuesday of the month 1pm-2pm, Journey Room    

## 2018-08-11 NOTE — Assessment & Plan Note (Signed)
1.  Mediastinal and hilar adenopathy: - History of early-stage melanoma, resected with wide excision in 2012 by Dr. Nevada Crane - Patient was followed by Dr. Tressie Stalker and discharged from the clinic.  She had fatty liver and some pericaval adenopathy at that time. - She reports having chest pain on and off for over a year since her husband passed away.  After the chest pain radiated to her neck and shoulder, she went to the ER on 06/06/2018. -CT scan of the chest PE protocol was done which showed multiple mediastinal lymph nodes in the prevascular, paratracheal, AP window.  Largest measures 19 mm.  Subcarinal lymph node measures 12 mm.  Borderline enlarged nodes of the bilateral hilar region.  No pulmonary findings. - CT of the abdomen and pelvis on 03/26/2011 showed pericaval lymph node measuring 0.8 cm.  -Denies any fevers, night sweats or weight loss in the last 6 months.  Denies any new onset pains.  She does report some tenderness in the right lower lateral rib area. -We have ordered a PET scan, however her insurance denied it. -We discussed the results of the CT of the abdomen and pelvis dated 08/05/2018 which did not show suspicious metastatic disease in the abdomen or pelvis.  Mild aortocaval adenopathy stable since 2012.  Mild subcarinal adenopathy stable since 06/06/2018 chest CT, measuring 12 mm. - An LDH level was normal. -I have recommended a repeat CT of the chest to follow-up on hilar adenopathy in the next 2 months.  We will see her back after the CT scan. -She was told to follow-up with her dermatologist.

## 2018-08-16 ENCOUNTER — Ambulatory Visit: Payer: BLUE CROSS/BLUE SHIELD | Admitting: Student

## 2018-10-10 ENCOUNTER — Ambulatory Visit (HOSPITAL_COMMUNITY): Admission: RE | Admit: 2018-10-10 | Payer: BLUE CROSS/BLUE SHIELD | Source: Ambulatory Visit

## 2018-10-10 ENCOUNTER — Other Ambulatory Visit (HOSPITAL_COMMUNITY): Payer: BLUE CROSS/BLUE SHIELD

## 2018-10-13 ENCOUNTER — Ambulatory Visit (HOSPITAL_COMMUNITY): Payer: BLUE CROSS/BLUE SHIELD | Admitting: Hematology

## 2019-07-05 DIAGNOSIS — E119 Type 2 diabetes mellitus without complications: Secondary | ICD-10-CM | POA: Diagnosis not present

## 2019-07-05 DIAGNOSIS — E1140 Type 2 diabetes mellitus with diabetic neuropathy, unspecified: Secondary | ICD-10-CM | POA: Diagnosis not present

## 2019-07-05 DIAGNOSIS — Z6841 Body Mass Index (BMI) 40.0 and over, adult: Secondary | ICD-10-CM | POA: Diagnosis not present

## 2019-07-05 DIAGNOSIS — I10 Essential (primary) hypertension: Secondary | ICD-10-CM | POA: Diagnosis not present

## 2019-10-20 ENCOUNTER — Ambulatory Visit: Payer: BLUE CROSS/BLUE SHIELD | Attending: Internal Medicine

## 2019-10-20 DIAGNOSIS — Z23 Encounter for immunization: Secondary | ICD-10-CM

## 2019-10-20 NOTE — Progress Notes (Signed)
   Covid-19 Vaccination Clinic  Name:  [facility]    MRN: [id number] DOB: May 22, 1956  10/20/2019  Ms. Herrmann was observed post Covid-19 immunization for 15 minutes without incident. She was provided with Vaccine Information Sheet and instruction to access the V-Safe system.   Ms. Gillooly was instructed to call 911 with any severe reactions post vaccine: Marland Kitchen Difficulty breathing  . Swelling of face and throat  . A fast heartbeat  . A bad rash all over body  . Dizziness and weakness   Immunizations Administered    Name Date Dose VIS Date Route   Moderna COVID-19 Vaccine 10/20/2019 11:15 AM 0.5 mL 06/27/2019 Intramuscular   Manufacturer: Moderna   Lot: [id number]   [place]:[id number]

## 2019-11-21 ENCOUNTER — Ambulatory Visit: Payer: Self-pay | Attending: Internal Medicine

## 2019-11-21 DIAGNOSIS — Z23 Encounter for immunization: Secondary | ICD-10-CM

## 2019-11-21 NOTE — Progress Notes (Signed)
   Covid-19 Vaccination Clinic  Name:  Shelby Morgan    MRN: [id number] DOB: 1955-11-26  11/21/2019  [name] was observed post Covid-19 immunization for 15 minutes without incident. She was provided with Vaccine Information Sheet and instruction to access the V-Safe system.   [name] was instructed to call 911 with any severe reactions post vaccine: Marland Kitchen Difficulty breathing  . Swelling of face and throat  . A fast heartbeat  . A bad rash all over body  . Dizziness and weakness   Immunizations Administered    Name Date Dose VIS Date Route   Moderna COVID-19 Vaccine 11/21/2019 12:15 PM 0.5 mL 06/2019 Intramuscular   Manufacturer: Moderna   Lot: [id number]   [place]:[id number]

## 2020-02-23 IMAGING — DX DG CHEST 2V
2 series · 2 of 2 positions shown · non-contrast
Comparison: PA and lateral chest x-ray January 08, 2011

CLINICAL DATA: Chest and left arm pain for the past month. History
of diabetes and hypertension. Former smoker.

EXAM:
CHEST - 2 VIEW

[chest pa]
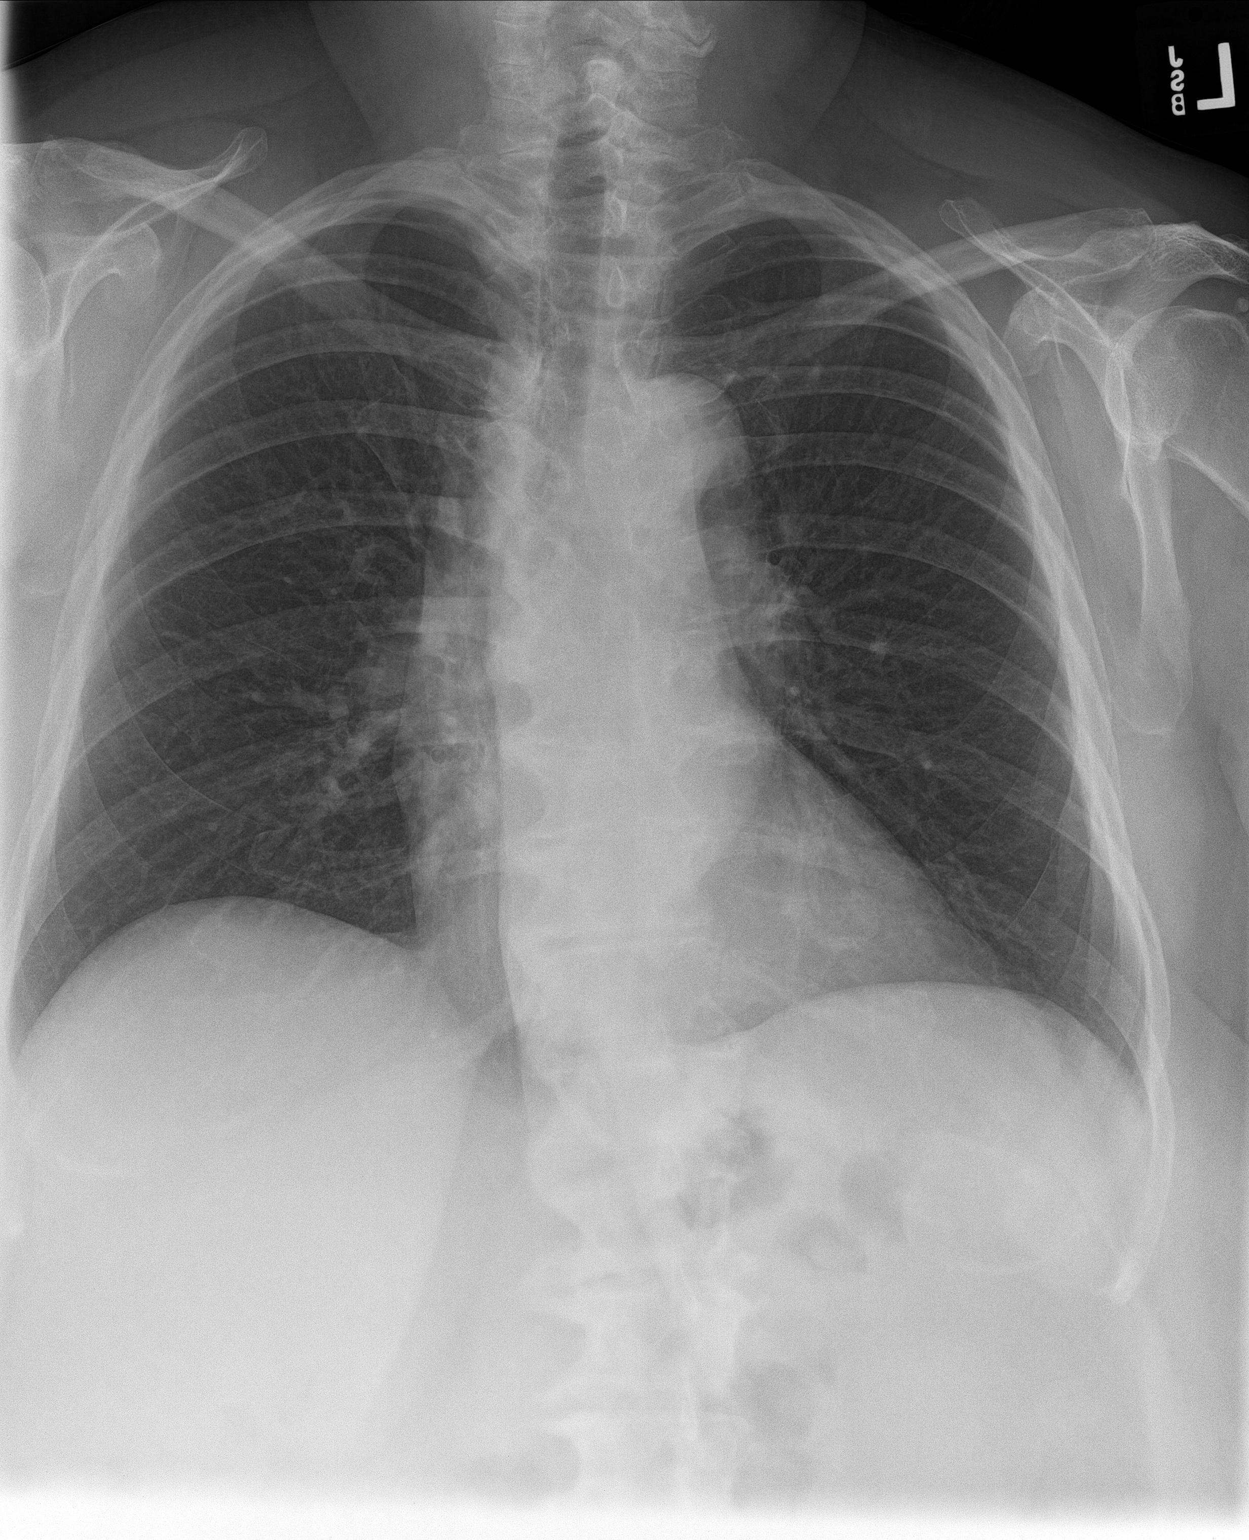

[chest lat]
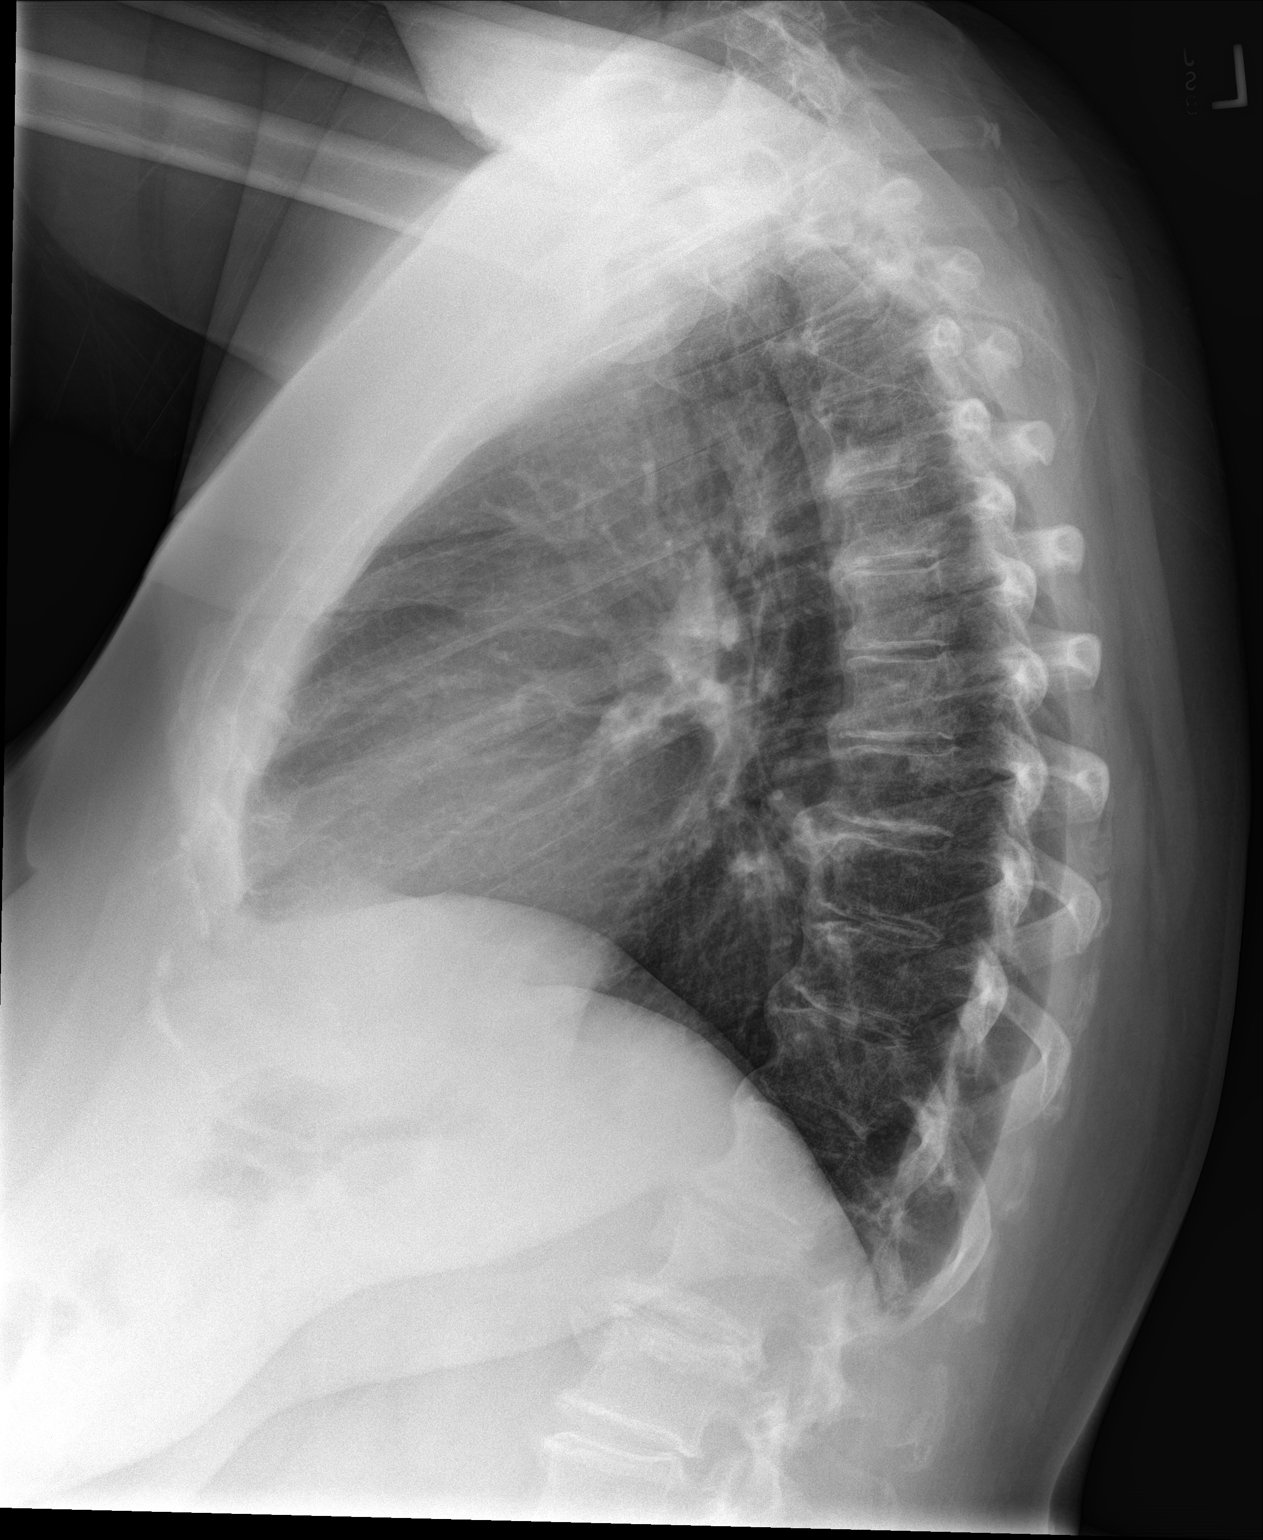

[2 of 2 positions shown; findings below may reference images not displayed]

FINDINGS: The lungs are adequately inflated and clear. There is no
pneumothorax or pleural effusion. The heart and pulmonary
vascularity are normal. The mediastinum is normal in width. There is
tortuosity of the descending thoracic aorta. There is faint
calcification in the wall of the aortic arch. There is multilevel
degenerative disc disease of the thoracic spine.
IMPRESSION: There is no pneumonia, CHF, nor other acute cardiopulmonary
abnormality.

Thoracic aortic atherosclerosis.

## 2020-09-06 ENCOUNTER — Telehealth: Payer: Self-pay | Admitting: Physical Medicine and Rehabilitation

## 2020-09-06 NOTE — Telephone Encounter (Signed)
Received referral from [facility]. Left message #1 to schedule an OV for lumbar radiculopathy. [facility].

## 2020-09-18 NOTE — Telephone Encounter (Signed)
Scheduled for 3/2 at 1030.

## 2020-09-25 ENCOUNTER — Other Ambulatory Visit: Payer: Self-pay

## 2020-09-25 ENCOUNTER — Encounter: Payer: Self-pay | Admitting: Physical Medicine and Rehabilitation

## 2020-09-25 ENCOUNTER — Ambulatory Visit (INDEPENDENT_AMBULATORY_CARE_PROVIDER_SITE_OTHER): Payer: 59

## 2020-09-25 ENCOUNTER — Telehealth: Payer: Self-pay | Admitting: Physical Medicine and Rehabilitation

## 2020-09-25 ENCOUNTER — Ambulatory Visit: Payer: Self-pay | Admitting: Physical Medicine and Rehabilitation

## 2020-09-25 VITALS — BP 172/85 | HR 66

## 2020-09-25 DIAGNOSIS — G8929 Other chronic pain: Secondary | ICD-10-CM

## 2020-09-25 DIAGNOSIS — R202 Paresthesia of skin: Secondary | ICD-10-CM

## 2020-09-25 DIAGNOSIS — M4126 Other idiopathic scoliosis, lumbar region: Secondary | ICD-10-CM

## 2020-09-25 DIAGNOSIS — M5441 Lumbago with sciatica, right side: Secondary | ICD-10-CM | POA: Diagnosis not present

## 2020-09-25 DIAGNOSIS — Q762 Congenital spondylolisthesis: Secondary | ICD-10-CM | POA: Diagnosis not present

## 2020-09-25 DIAGNOSIS — M4316 Spondylolisthesis, lumbar region: Secondary | ICD-10-CM | POA: Diagnosis not present

## 2020-09-25 DIAGNOSIS — M5416 Radiculopathy, lumbar region: Secondary | ICD-10-CM

## 2020-09-25 NOTE — Progress Notes (Signed)
Right buttock pain radiating to right hip and leg. Lateral thigh pain- "sore to the touch all the time." Numeric Pain Rating Scale and Functional Assessment Average Pain 7 Pain Right Now 5 My pain is constant, dull and aching Pain is worse with: walking, sitting and standing Pain improves with: rest   In the last MONTH (on 0-10 scale) has pain interfered with the following?  1. General activity like being  able to carry out your everyday physical activities such as walking, climbing stairs, carrying groceries, or moving a chair?  Rating(10)  2. Relation with others like being able to carry out your usual social activities and roles such as  activities at home, at work and in your community. Rating(10)  3. Enjoyment of life such that you have  been bothered by emotional problems such as feeling anxious, depressed or irritable?  Rating(9)

## 2020-09-25 NOTE — Telephone Encounter (Signed)
Needs auth for right S1 TF. Patient can be scheduled for a Thursday afternoon when authorized.

## 2020-09-26 ENCOUNTER — Encounter: Payer: Self-pay | Admitting: Physical Medicine and Rehabilitation

## 2020-09-26 NOTE — Progress Notes (Signed)
Shelby Morgan - 66 y.o. female MRN [id number]  Date of birth: 1956-07-27  Office Visit Note: Visit Date: 09/25/2020 PCP: Cory Munch, PA-C Referred by: Ginger Organ  Subjective: Chief Complaint  Patient presents with  . Lower Back - Pain   HPI: Shelby Morgan is a 65 y.o. female who comes in today At the request of [facility] where her primary provider is Collene Mares, PA-C for evaluation management of chronic worsening severe right buttock pain with referral into the right hip and leg in an L5 and S1 distribution in terms of dermatomal radiation.  She reports 7 out of 10 average pain which is a constant dull and aching tooth pain worse with really any activity walking sitting and standing but really predominantly more with standing.  She gets some improvement with rest and the use of Tylenol and Aleve.  Her case is complicated by history of type 2 diabetes, obesity and anxiety.  She reports to me today that the symptoms have been going on now for many months and she is not really gotten much relief as she thought this was just her sciatic nerve and she tried to do home exercises and stretching which she has been doing religiously.  She has had physical therapy in the remote past for chronic back pain issues.  She reports no specific injury.  No other trauma no focal weakness.  She does endorse paresthesias and tingling in the foot but she also endorses that on the left foot as well.  No left-sided pain or hip pain or leg pain.  She reports that she may have neuropathy but does not carry a diagnosis of neuropathy and no history of electrodiagnostic studies.  She reports that the symptoms severely limit what she can do at this point.  She is really unable to do any other exercise or walk etc.  Most of her pain is with getting up and weightbearing.  She does not endorse any groin pain or any other joint type pain.  She reports some knee pain in general.  The tingling in  the feet seems to be unrelated to when the right hip and leg hurt.  Her diabetes is controlled without insulin.  Her last hemoglobin A1c that we have access to was 6.5.  This was approximately 2 years ago.  She has not had any recent imaging of the spine.  I did obtain x-rays today of the lumbar spine and this is reviewed with the patient with spine models and imaging.  The x-ray read is below but basically shows a rightward scoliosis centered at about L2.  There seems to be a transitional segment with almost fully lumbarized first sacral level.  She has a 10 mm spondylolisthesis of L5 on S1 that seems to be related to a pars defect but hard to see.  She has multilevel facet arthropathy and degenerative changes throughout.  She has no prior history of lumbar injections.  She reports that her brother sees me and is a gotten good relief with injections in the past.  Review of Systems  Musculoskeletal: Positive for back pain and joint pain.       Right hip and leg pain  Neurological: Positive for tingling.  All other systems reviewed and are negative.  Otherwise per HPI.  Assessment & Plan: Visit Diagnoses:    ICD-10-CM   1. Chronic right-sided low back pain with right-sided sciatica  M54.41 XR Lumbar Spine 2-3 Views  G89.29   2. 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 Lumbar radiculopathy  M54.16   5. Other idiopathic scoliosis, lumbar region  M41.26   6. Paresthesia of skin  R20.2      Plan: Findings:  1. Chronic many year history of low back pain with now several month history of right radicular leg pain in a pretty consistent L5 or S1 distribution.  Probably more S1 distribution posterior laterally.  She does have a transitional segment on imaging and this can get somewhat confusing from a dermatome standpoint when numbering those levels.  She has a pretty significant scoliosis centered at L2.  And likely pars defect with listhesis of L5 on S1.  She  clearly has reasons probably to have a radiculopathy radicular pain.  Could be related to stenosis or disc protrusion at the level of stenosis.  We had a long talk about "bulging disc "and "sciatic nerves ".  I think she has a better understanding now or looking at.  I think given the amount of pain that she is having it would be wise to complete a diagnostic and hopefully therapeutic L5-S1 interlaminar epidural steroid injection.  She has tried and failed time through her primary care physician and this is been over 3 months now.  She has had medication management and she is done home exercises and stretching.  Depending on relief diagnostically would either look at MRI imaging of the lumbar spine versus regrouping with a physical therapist for more core strengthening and perhaps neural flossing etc.  She will continue on current medications at this point.  Would consider the use of other nerve membrane stabilizing medications.  2.  Tingling paresthesias in the right and left foot.  Right foot paresthesias at least to some degree seem to coincide with an S1 radicular problem.  The fact that she gets some in the toes bilaterally probably indicate some level of neuropathy.  She does have type 2 diabetes non-insulin-dependent.  At this point I would defer treatment to her primary care physician and I think depending on treatment of her spine it should reveal itself as to how much of this is the radicular symptom on the right.  Consider the use of gabapentin.    Meds & Orders: No orders of the defined types were placed in this encounter.   Orders Placed This Encounter  Procedures  . XR Lumbar Spine 2-3 Views    Follow-up: Return for Right S1 transforaminal epidural steroid injection..   Procedures: No procedures performed      Clinical History: No specialty comments available.   She reports that she quit smoking about 27 years ago. Her smoking use included cigarettes. She has a 5.50 pack-year  smoking history. She has never used smokeless tobacco. No results for input(s): HGBA1C, LABURIC in the last 8760 hours.  Objective:  VS:  HT:    WT:   BMI:     BP:(!) 172/85  HR:66bpm  TEMP: ( )  RESP:  Physical Exam Vitals and nursing note reviewed.  Constitutional:      General: She is not in acute distress.    Appearance: Normal appearance. She is obese. She is not ill-appearing.  HENT:     Head: Normocephalic and atraumatic.     Right Ear: External ear normal.     Left Ear: External ear normal.  Eyes:     Extraocular Movements: Extraocular movements intact.  Cardiovascular:  Rate and Rhythm: Normal rate.     Pulses: Normal pulses.  Pulmonary:     Effort: Pulmonary effort is normal. No respiratory distress.  Abdominal:     General: There is no distension.     Palpations: Abdomen is soft.  Musculoskeletal:        General: Tenderness present.     Cervical back: Neck supple.     Right lower leg: No edema.     Left lower leg: No edema.     Comments: Patient has good distal strength with no pain over the greater trochanters.  No clonus or focal weakness.  She has some decreased right ankle mobility to 2 prior ankle surgery with hardware.  She has some dysesthesias in the right S1 distribution.  Normal sensation on the left.  She has a positive slump test on the right and negative slump test on the left.  She has no pain with hip rotation.  She does have pain with facet loading and extension.  Skin:    Findings: No erythema, lesion or rash.  Neurological:     General: No focal deficit present.     Mental Status: She is alert and oriented to person, place, and time.     Sensory: No sensory deficit.     Motor: No weakness or abnormal muscle tone.     Coordination: Coordination normal.  Psychiatric:        Mood and Affect: Mood normal.        Behavior: Behavior normal.     Ortho Exam  Imaging: XR Lumbar Spine 2-3 Views  Result Date: 09/26/2020 AP and lateral lumbar  spine shows rotated pelvis to the left with moderate lumbar scoliosis centered at about L2.  Alignment is interesting that she appears to have an S1 lumbarized transitional segment given the ribs at T12.  She has minimal degenerative changes of the hips bilaterally.  Lateral view shows grade one 9 mm listhesis of L5 on S1 likely from pars defects although hard to see.  There is by foraminal narrowing here.  There is some anterior osteophytes in the upper lumbar region.  Degenerative changes throughout.   Past Medical/Family/Surgical/Social History: Medications & Allergies reviewed per EMR, new medications updated. Patient Active Problem List   Diagnosis Date Noted  . History of melanoma 06/20/2018  . Chest pain 06/06/2018  . Hypertension associated with diabetes ([place]) 06/06/2018  . Hyperlipidemia associated with type 2 diabetes mellitus ([place]) 06/06/2018  . Mediastinal adenopathy 06/06/2018  . Elevated LFTs 05/02/2012  . Lactic acidosis 05/02/2012  . Cellulitis of right leg 05/01/2012  . Diabetes mellitus type 2 in obese ([place]) 05/01/2012  . Hypokalemia 05/01/2012  . Hyponatremia 05/01/2012  . NAFLD (nonalcoholic fatty liver disease) 05/14/2011  . Obesity 05/14/2011  . Hemorrhoids 05/14/2011  . Transaminitis 01/29/2011  . Hepatomegaly 01/29/2011  . Rectal bleed 01/29/2011  . Dysphagia 01/29/2011  . RUQ pain 01/29/2011  . Hypercalcemia 01/29/2011  . Anxiety 01/29/2011  . GERD (gastroesophageal reflux disease) 01/29/2011   Past Medical History:  Diagnosis Date  . Anxiety   . Bell's palsy 01/08/2011  . Chronic back pain   . Depression   . Diabetes mellitus   . GERD (gastroesophageal reflux disease)   . Hepatomegaly   . Hypertension   . Melanoma [facility]) 2009   Dr Nevada Crane, Stage 2, required only surgery  . NAFLD (nonalcoholic fatty liver disease)   . Skin cancer    Family History  Problem Relation Age of Onset  .  Heart disease Mother   . Heart disease Maternal Grandmother   .  Heart disease Maternal Grandfather   . COPD Father   . Bladder Cancer Father   . Anesthesia problems Neg Hx   . Hypotension Neg Hx   . Malignant hyperthermia Neg Hx   . Pseudochol deficiency Neg Hx   . Colon cancer Neg Hx    Past Surgical History:  Procedure Laterality Date  . CESAREAN SECTION    . COLONOSCOPY  03/09/2011   Dr Oneida Alar diverticulosis, internal and external hemorrhoids  . ESOPHAGOGASTRODUODENOSCOPY  03/09/2011   Esophageal stricture dilated to 16 MM savory, NSAID- induced gastritis and duodenitis  . LEG SURGERY     plates/screws/hx fx-right leg  . MELANOMA EXCISION     right knee  . SAVORY DILATION  03/09/2011   Social History   Occupational History  . 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 Smoking status: Former Smoker    Packs/day: 0.50    Years: 11.00    Pack years: 5.50    Types: Cigarettes    Quit date: 03/03/1993    Years since quitting: 27.5  . Smokeless tobacco: Never Used  Vaping Use  . Vaping Use: Never used  Substance and Sexual Activity  . Alcohol use: Yes    Comment: wine 2-3 glasses per weekend  . Drug use: No  . Sexual activity: Yes    Birth control/protection: Post-menopausal

## 2020-09-27 NOTE — Telephone Encounter (Signed)
Pt was approve Auth# [id number]

## 2020-09-27 NOTE — Telephone Encounter (Signed)
Pt insurance is PENDING. 

## 2020-09-27 NOTE — Telephone Encounter (Signed)
Called pt and sch 3/10

## 2020-09-27 NOTE — Telephone Encounter (Signed)
Called pt and LVM #1 

## 2020-10-03 ENCOUNTER — Ambulatory Visit: Payer: Self-pay

## 2020-10-03 ENCOUNTER — Encounter: Payer: Self-pay | Admitting: Physical Medicine and Rehabilitation

## 2020-10-03 ENCOUNTER — Other Ambulatory Visit: Payer: Self-pay

## 2020-10-03 ENCOUNTER — Ambulatory Visit (INDEPENDENT_AMBULATORY_CARE_PROVIDER_SITE_OTHER): Payer: 59 | Admitting: Physical Medicine and Rehabilitation

## 2020-10-03 VITALS — BP 177/84 | HR 73

## 2020-10-03 DIAGNOSIS — M5416 Radiculopathy, lumbar region: Secondary | ICD-10-CM

## 2020-10-03 DIAGNOSIS — S39012A Strain of muscle, fascia and tendon of lower back, initial encounter: Secondary | ICD-10-CM | POA: Diagnosis not present

## 2020-10-03 MED ORDER — BETAMETHASONE SOD PHOS & ACET 6 (3-3) MG/ML IJ SUSP
12.0000 mg | Freq: Once | INTRAMUSCULAR | Status: AC
  Administered 2020-10-03: 12 mg

## 2020-10-03 NOTE — Patient Instructions (Signed)

## 2020-10-03 NOTE — Progress Notes (Signed)
Wreck same day I saw her. T-Bone. Driver. Pt state lower back pain that mostly on her right side. Pt state walking and standing makes the pain worse. Pt state she take over the counter meds and ice to help ease the pain.  Numeric Pain Rating Scale and Functional Assessment Average Pain 7   In the last MONTH (on 0-10 scale) has pain interfered with the following?  1. General activity like being  able to carry out your everyday physical activities such as walking, climbing stairs, carrying groceries, or moving a chair?  Rating(10)   +Driver, -BT, -Dye Allergies.

## 2020-10-15 ENCOUNTER — Telehealth: Payer: Self-pay | Admitting: Physical Medicine and Rehabilitation

## 2020-10-15 DIAGNOSIS — M5416 Radiculopathy, lumbar region: Secondary | ICD-10-CM

## 2020-10-15 NOTE — Telephone Encounter (Signed)
Patient called requesting to set an appt for back pains. Patient also stated the back injection received really didn't help. Please call patient at [phone].

## 2020-10-15 NOTE — Telephone Encounter (Signed)
MRI order placed and left message to notify patient.

## 2020-10-15 NOTE — Telephone Encounter (Signed)
Right S1 TF 10/03/20. Please advise.

## 2020-10-15 NOTE — Telephone Encounter (Signed)
Needs MRI Lspine, per my notes

## 2020-10-30 ENCOUNTER — Encounter: Payer: Self-pay | Admitting: Physical Medicine and Rehabilitation

## 2020-10-30 NOTE — Procedures (Signed)
S1 Lumbosacral Transforaminal Epidural Steroid Injection - Sub-Pedicular Approach with Fluoroscopic Guidance   Patient: Shelby Morgan      Date of Birth: [birth year] MRN: [id number] PCP: Cory Munch, PA-C      Visit Date: 10/03/2020   Universal Protocol:    Date/Time: 04/06/225:51 AM  Consent Given By: the patient  Position:  PRONE  Additional Comments: Vital signs were monitored before and after the procedure. Patient was prepped and draped in the usual sterile fashion. The correct patient, procedure, and site was verified.   Injection Procedure Details:  Procedure Site One Meds Administered:  Meds ordered this encounter  Medications  . betamethasone acetate-betamethasone sodium phosphate (CELESTONE) injection 12 mg    Laterality: Right  Location/Site:  S1 Foramen   Needle size: 22 ga.  Needle type: Spinal  Needle Placement: Transforaminal  Findings:   -Comments: Excellent flow of contrast along the nerve, nerve root and into the epidural space.  Epidurogram: Contrast epidurogram showed no nerve root cut off or restricted flow pattern.  Procedure Details: After squaring off the sacral end-plate to get a true AP view, the C-arm was positioned so that the best possible view of the S1 foramen was visualized. The soft tissues overlying this structure were infiltrated with 2-3 ml. of 1% Lidocaine without Epinephrine.    The spinal needle was inserted toward the target using a "trajectory" view along the fluoroscope beam.  Under AP and lateral visualization, the needle was advanced so it did not puncture dura. Biplanar projections were used to confirm position. Aspiration was confirmed to be negative for CSF and/or blood. A 1-2 ml. volume of Isovue-250 was injected and flow of contrast was noted at each level. Radiographs were obtained for documentation purposes.   After attaining the desired flow of contrast documented above, a 0.5 to 1.0 ml test dose of 0.25%  Marcaine was injected into each respective transforaminal space.  The patient was observed for 90 seconds post injection.  After no sensory deficits were reported, and normal lower extremity motor function was noted,   the above injectate was administered so that equal amounts of the injectate were placed at each foramen (level) into the transforaminal epidural space.   Additional Comments:  The patient tolerated the procedure well Dressing: Band-Aid with 2 x 2 sterile gauze    Post-procedure details: Patient was observed during the procedure. Post-procedure instructions were reviewed.  Patient left the clinic in stable condition.
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
## 2020-11-01 ENCOUNTER — Other Ambulatory Visit: Payer: Self-pay

## 2020-11-01 ENCOUNTER — Ambulatory Visit (HOSPITAL_COMMUNITY)
Admission: RE | Admit: 2020-11-01 | Discharge: 2020-11-01 | Disposition: A | Discharge status: Home / Self Care | Payer: 59 | Source: Ambulatory Visit | Attending: Physical Medicine and Rehabilitation | Admitting: Physical Medicine and Rehabilitation

## 2020-11-01 DIAGNOSIS — M5416 Radiculopathy, lumbar region: Secondary | ICD-10-CM | POA: Insufficient documentation

## 2020-11-04 ENCOUNTER — Telehealth: Payer: Self-pay | Admitting: Physical Medicine and Rehabilitation

## 2020-11-04 NOTE — Telephone Encounter (Signed)
Left message #1 to schedule OV for MRI review.

## 2020-11-04 NOTE — Telephone Encounter (Signed)
-----   Message from Magnus Sinning, MD sent at 11/04/2020  8:19 AM EDT ----- Regarding: mri Need OV to review MRI

## 2020-11-07 NOTE — Telephone Encounter (Signed)
Scheduled for 5/4 at 1030.

## 2020-11-07 NOTE — Telephone Encounter (Signed)
Left message #2

## 2020-11-27 ENCOUNTER — Other Ambulatory Visit: Payer: Self-pay

## 2020-11-27 ENCOUNTER — Encounter: Payer: Self-pay | Admitting: Physical Medicine and Rehabilitation

## 2020-11-27 ENCOUNTER — Ambulatory Visit (INDEPENDENT_AMBULATORY_CARE_PROVIDER_SITE_OTHER): Payer: 59 | Admitting: Physical Medicine and Rehabilitation

## 2020-11-27 VITALS — BP 143/88 | HR 78

## 2020-11-27 DIAGNOSIS — M5416 Radiculopathy, lumbar region: Secondary | ICD-10-CM | POA: Diagnosis not present

## 2020-11-27 DIAGNOSIS — Q762 Congenital spondylolisthesis: Secondary | ICD-10-CM

## 2020-11-27 DIAGNOSIS — M48062 Spinal stenosis, lumbar region with neurogenic claudication: Secondary | ICD-10-CM | POA: Diagnosis not present

## 2020-11-27 DIAGNOSIS — M48061 Spinal stenosis, lumbar region without neurogenic claudication: Secondary | ICD-10-CM

## 2020-11-27 NOTE — Progress Notes (Signed)
Pt state lower back pain that travels to her right hip and down her leg..Pt state walking for a long period of time makes the pain worse. Pt state getting up out if the bed makes the pain worse. Pt state she takes over the counter pain meds to help ease her pain.  Numeric Pain Rating Scale and Functional Assessment Average Pain 8   In the last MONTH (on 0-10 scale) has pain interfered with the following?  1. General activity like being  able to carry out your everyday physical activities such as walking, climbing stairs, carrying groceries, or moving a chair?  Rating(8)   +Driver, -BT, -Dye Allergies.

## 2020-11-28 ENCOUNTER — Encounter: Payer: Self-pay | Admitting: Physical Medicine and Rehabilitation

## 2020-11-28 NOTE — Progress Notes (Signed)
Shelby Morgan - 65 y.o. female MRN [id number]  Date of birth: [birth year]  Office Visit Note: Visit Date: 11/27/2020 PCP: Cory Munch, PA-C Referred by: Ginger Organ  Subjective: Chief Complaint  Patient presents with  . Lower Back - Pain  . Right Hip - Pain  . Right Leg - Pain   HPI: Shelby Morgan is a 65 y.o. female who comes in today For continued evaluation and management of severe lower back pain with referral into the posterior lateral hip and leg to the ankle.  By way of review she has had this ongoing now for years but with many months now with just worsening symptoms and those notes can be reviewed when I first saw her.  She has no history of prior lumbar surgery.  She continues to report 8 out of 10 back and right hip pain.  It is worse getting up out of bed and worse from a seated to standing worse with standing and walking.  She does use meloxicam and Aleve as well as Tylenol but without much pain relief overall.  She has had physical therapy in the past and has been working with Dr. Luna Glasgow for her knees for some time.  She continues with home exercise program is trying to lose weight.  Her case is complicated by obesity and type 2 diabetes.  She has not noted any increasing symptoms since I last saw her just no improvement.  No focal weakness no red flag complaints.  We actually completed a diagnostic S1 transforaminal injection on the right Sharol Given her symptoms when we first saw her did the amount of pain that she was having.  Unfortunately when this only helped very temporarily and not very much we did obtain MRI of the lumbar spine.  MRIs reviewed with the patient today using spine images and model limits.  This is reviewed below in the notes.  It basically shows multilevel severe issues in her lumbar spine.  She has severe stenosis at L2-3 which could be causing most of her symptoms.  She also has bilateral pars defects at L5-S1 with very small listhesis but with right  more than left foraminal narrowing.  Review of Systems  Musculoskeletal: Positive for back pain and joint pain.  Neurological: Positive for tremors.  All other systems reviewed and are negative.  Otherwise per HPI.  Assessment & Plan: Visit Diagnoses:    ICD-10-CM   1. Spinal stenosis of lumbar region with neurogenic claudication  M48.062   2. Lumbar radiculopathy  M54.16   3. 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 Foraminal stenosis of lumbar region  M48.061      Plan: Findings:  Chronic recalcitrant severe low back pain with right hip and leg pain consistent with radicular type pain either from stenosis at L2-3 which is quite severe or foraminal narrowing at L5-S1 do to small slippage and pars defect.  Did explain to her both of these issues and the types of pain that would be involved with either in either of these could cause her symptoms.  It does make sense that the S1 transforaminal injection would not help as much because maybe it needed to be an L5 transforaminal injection versus an injection higher up in the lumbar spine given the severe stenosis.  I would recommend differential injection at this point what which would be an L3-4 interlaminar injection just to see how much relief she gets with medication through that level of stenosis.  If  it was a great deal of relief then therapeutically she could have 1 of these every so often.  It would also diagnose that the stenosis was the main issue and she may benefit from 1 level lumbar decompression.  If she does not get much relief I would look at an L5 transforaminal injection diagnostically and again looking at potential for surgical decompression at that level if needed.  No change in medication at this point but could consider nerve membrane stabilizing medication such as Lyrica or gabapentin.  She will continue with home exercise and we could talk about regrouping with physical therapist or chiropractor.  She does not really like the idea of  a chiropractor.  Neck step is right L3-4 interlaminar epidural steroid injection fluoroscopic guidance from a diagnostic standpoint.  All injections done as part of a comprehensive pain management type approach with access to orthopedic surgery and behavioral health/psychosocial therapy if needed for coping strategies etc.    Meds & Orders: No orders of the defined types were placed in this encounter.  No orders of the defined types were placed in this encounter.   Follow-up: Return for Right L3-4 interlaminar epidural steroid injection.   Procedures: No procedures performed      Clinical History: MRI LUMBAR SPINE WITHOUT CONTRAST  TECHNIQUE: Multiplanar, multisequence MR imaging of the lumbar spine was performed. No intravenous contrast was administered.  COMPARISON:  Lumbar spine radiographs 09/25/2020  FINDINGS: Segmentation:  5 lumbar vertebrae.  Hypoplastic ribs at T12.  Alignment: Mild lumbar levoscoliosis. Trace retrolisthesis of L2 on L3. Bilateral L5 pars defects with 8 mm anterolisthesis of L5 on S1.  Vertebrae: No fracture or suspicious osseous lesion. Prominent degenerative endplate changes asymmetrically greater on the right at L1-2 and L2-3 including degenerative edema at L2-3.  Conus medullaris and cauda equina: Conus extends to the L1 level. Conus and cauda equina appear normal.  Paraspinal and other soft tissues: Unremarkable.  Disc levels:  Disc desiccation throughout the lumbar spine. Moderate to severe disc space narrowing at L1-2, L2-3, and L5-S1 with moderate narrowing at L3-4 and mild-to-moderate narrowing at L4-5.  T12-L1: Negative.  L1-2: Right eccentric disc bulging, endplate spurring, and mild facet hypertrophy result in mild right lateral recess stenosis and mild right neural foraminal stenosis without significant spinal stenosis.  L2-3: Prominent circumferential disc bulging eccentric to the right, endplate spurring, prominent  dorsal epidural fat, and severe facet and ligamentum flavum hypertrophy result in severe spinal stenosis and severe right and mild-to-moderate left neural foraminal stenosis.  L3-4: Prominent circumferential disc bulging eccentric to the right, endplate spurring, and severe facet and ligamentum flavum hypertrophy result in mild spinal stenosis and moderate right neural foraminal stenosis.  L4-5: Disc bulging eccentric to the left and moderate facet and ligamentum flavum hypertrophy result in mild bilateral lateral recess stenosis and mild right and moderate left neural foraminal stenosis without spinal stenosis.  L5-S1: Anterolisthesis with bulging uncovered disc, endplate spurring, and spurring at the pars defects result in moderate right and severe left neural foraminal stenosis without spinal stenosis.  IMPRESSION: 1. 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 2. Severe spinal stenosis and severe right neural foraminal stenosis at L2-3. 3. Grade 2 anterolisthesis at L5-S1 due to L5 pars defects with moderate right and severe left neural foraminal stenosis. 4. Mild spinal stenosis at L3-4.   Electronically Signed   By: Logan Bores M.D.   On: 11/01/2020 16:55   She reports that she quit smoking about 27 years ago.  Her smoking use included cigarettes. She has a 5.50 pack-year smoking history. She has never used smokeless tobacco. No results for input(s): HGBA1C, LABURIC in the last 8760 hours.  Objective:  VS:  HT:    WT:   BMI:     BP:(!) 143/88  HR:78bpm  TEMP: ( )  RESP:  Physical Exam Vitals and nursing note reviewed.  Constitutional:      General: She is not in acute distress.    Appearance: Normal appearance. She is obese. She is not ill-appearing.  HENT:     Head: Normocephalic and atraumatic.     Right Ear: External ear normal.     Left Ear: External ear normal.  Eyes:     Extraocular Movements: Extraocular movements intact.   Cardiovascular:     Rate and Rhythm: Normal rate.     Pulses: Normal pulses.  Pulmonary:     Effort: Pulmonary effort is normal. No respiratory distress.  Abdominal:     General: There is no distension.     Palpations: Abdomen is soft.  Musculoskeletal:        General: Tenderness present.     Cervical back: Neck supple.     Right lower leg: No edema.     Left lower leg: No edema.     Comments: Patient has good distal strength with no pain over the greater trochanters.  No clonus or focal weakness. Patient somewhat slow to rise from a seated position to full extension.  There is concordant low back pain with facet loading and lumbar spine extension rotation.  There are no definitive trigger points but the patient is somewhat tender across the lower back and PSIS.  There is no pain with hip rotation.  Equivocally positive straight leg raise on the right.   Skin:    Findings: No erythema, lesion or rash.  Neurological:     General: No focal deficit present.     Mental Status: She is alert and oriented to person, place, and time.     Sensory: No sensory deficit.     Motor: No weakness or abnormal muscle tone.     Coordination: Coordination normal.  Psychiatric:        Mood and Affect: Mood normal.        Behavior: Behavior normal.     Ortho Exam  Imaging: No results found.  Past Medical/Family/Surgical/Social History: Medications & Allergies reviewed per EMR, new medications updated. Patient Active Problem List   Diagnosis Date Noted  . History of melanoma 06/20/2018  . Chest pain 06/06/2018  . Hypertension associated with diabetes ([place]) 06/06/2018  . Hyperlipidemia associated with type 2 diabetes mellitus ([place]) 06/06/2018  . Mediastinal adenopathy 06/06/2018  . Elevated LFTs 05/02/2012  . Lactic acidosis 05/02/2012  . Cellulitis of right leg 05/01/2012  . Diabetes mellitus type 2 in obese ([place]) 05/01/2012  . Hypokalemia 05/01/2012  . Hyponatremia 05/01/2012  . NAFLD  (nonalcoholic fatty liver disease) 05/14/2011  . Obesity 05/14/2011  . Hemorrhoids 05/14/2011  . Transaminitis 01/29/2011  . Hepatomegaly 01/29/2011  . Rectal bleed 01/29/2011  . Dysphagia 01/29/2011  . RUQ pain 01/29/2011  . Hypercalcemia 01/29/2011  . Anxiety 01/29/2011  . GERD (gastroesophageal reflux disease) 01/29/2011   Past Medical History:  Diagnosis Date  . Anxiety   . Bell's palsy 01/08/2011  . Chronic back pain   . Depression   . Diabetes mellitus   . GERD (gastroesophageal reflux disease)   . Hepatomegaly   . Hypertension   .  Melanoma [facility]) 2009   Dr [place] Crane, Stage 2, required only surgery  . NAFLD (nonalcoholic fatty liver disease)   . Skin cancer    Family History  Problem Relation Age of Onset  . Heart disease Mother   . Heart disease Maternal Grandmother   . Heart disease Maternal Grandfather   . COPD Father   . Bladder Cancer Father   . Anesthesia problems Neg Hx   . Hypotension Neg Hx   . Malignant hyperthermia Neg Hx   . Pseudochol deficiency Neg Hx   . Colon cancer Neg Hx    Past Surgical History:  Procedure Laterality Date  . CESAREAN SECTION    . COLONOSCOPY  03/09/2011   Dr Oneida Alar diverticulosis, internal and external hemorrhoids  . ESOPHAGOGASTRODUODENOSCOPY  03/09/2011   Esophageal stricture dilated to 16 MM savory, NSAID- induced gastritis and duodenitis  . LEG SURGERY     plates/screws/hx fx-right leg  . MELANOMA EXCISION     right knee  . SAVORY DILATION  03/09/2011   Social History   Occupational History  . 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 Smoking status: Former Smoker    Packs/day: 0.50    Years: 11.00    Pack years: 5.50    Types: Cigarettes    Quit date: 03/03/1993    Years since quitting: 27.7  . Smokeless tobacco: Never Used  Vaping Use  . Vaping Use: Never used  Substance and Sexual Activity  . Alcohol use: Yes    Comment: wine 2-3 glasses per weekend  .  Drug use: No  . Sexual activity: Yes    Birth control/protection: Post-menopausal

## 2020-12-12 ENCOUNTER — Other Ambulatory Visit: Payer: Self-pay

## 2020-12-12 ENCOUNTER — Encounter: Payer: Self-pay | Admitting: Physical Medicine and Rehabilitation

## 2020-12-12 ENCOUNTER — Ambulatory Visit: Payer: Self-pay

## 2020-12-12 ENCOUNTER — Ambulatory Visit (INDEPENDENT_AMBULATORY_CARE_PROVIDER_SITE_OTHER): Payer: 59 | Admitting: Physical Medicine and Rehabilitation

## 2020-12-12 VITALS — BP 162/84 | HR 80

## 2020-12-12 DIAGNOSIS — M48062 Spinal stenosis, lumbar region with neurogenic claudication: Secondary | ICD-10-CM

## 2020-12-12 DIAGNOSIS — M5416 Radiculopathy, lumbar region: Secondary | ICD-10-CM | POA: Diagnosis not present

## 2020-12-12 MED ORDER — BETAMETHASONE SOD PHOS & ACET 6 (3-3) MG/ML IJ SUSP
12.0000 mg | Freq: Once | INTRAMUSCULAR | Status: AC
  Administered 2020-12-12: 12 mg

## 2020-12-12 NOTE — Patient Instructions (Signed)

## 2020-12-12 NOTE — Procedures (Signed)
Lumbar Epidural Steroid Injection - Interlaminar Approach with Fluoroscopic Guidance  Patient: Shelby Morgan      Date of Birth: 18-Sep-1955 MRN: [id number] PCP: Cory Munch, PA-C      Visit Date: 12/12/2020   Universal Protocol:     Consent Given By: the patient  Position: PRONE  Additional Comments: Vital signs were monitored before and after the procedure. Patient was prepped and draped in the usual sterile fashion. The correct patient, procedure, and site was verified.   Injection Procedure Details:   Procedure diagnoses: Spinal stenosis of lumbar region with neurogenic claudication [M48.062]   Meds Administered:  Meds ordered this encounter  Medications  . betamethasone acetate-betamethasone sodium phosphate (CELESTONE) injection 12 mg     Laterality: Right  Location/Site:  L3-L4  Needle: 3.5 in., 20 ga. Tuohy  Needle Placement: Paramedian epidural  Findings:   -Comments: Excellent flow of contrast into the epidural space.  Procedure Details: Using a paramedian approach from the side mentioned above, the region overlying the inferior lamina was localized under fluoroscopic visualization and the soft tissues overlying this structure were infiltrated with 4 ml. of 1% Lidocaine without Epinephrine. The Tuohy needle was inserted into the epidural space using a paramedian approach.   The epidural space was localized using loss of resistance along with counter oblique bi-planar fluoroscopic views.  After negative aspirate for air, blood, and CSF, a 2 ml. volume of Isovue-250 was injected into the epidural space and the flow of contrast was observed. Radiographs were obtained for documentation purposes.    The injectate was administered into the level noted above.   Additional Comments:  The patient tolerated the procedure well Dressing: 2 x 2 sterile gauze and Band-Aid    Post-procedure details: Patient was observed during the procedure. Post-procedure  instructions were reviewed.  Patient left the clinic in stable condition.

## 2020-12-12 NOTE — Progress Notes (Signed)
SAMYAH Morgan - 65 y.o. female MRN [id number]  Date of birth: 1956/06/16  Office Visit Note: Visit Date: 12/12/2020 PCP: Cory Munch, PA-C Referred by: Ginger Organ  Subjective: Chief Complaint  Patient presents with  . Lower Back - Pain   HPI:  Shelby Morgan is a 65 y.o. female who comes in today for planned Right L3-L4 Lumbar Interlaminar epidural steroid injection with fluoroscopic guidance.  The patient has failed conservative care including home exercise, medications, time and activity modification.  This injection will be diagnostic and hopefully therapeutic.  Please see requesting physician notes for further details and justification. MRI reviewed with images and spine model.  MRI reviewed in the note below.    ROS Otherwise per HPI.  Assessment & Plan: Visit Diagnoses:    ICD-10-CM   1. Spinal stenosis of lumbar region with neurogenic claudication  M48.062 XR C-ARM NO REPORT    Epidural Steroid injection    betamethasone acetate-betamethasone sodium phosphate (CELESTONE) injection 12 mg  2. Lumbar radiculopathy  M54.16 XR C-ARM NO REPORT    Epidural Steroid injection    betamethasone acetate-betamethasone sodium phosphate (CELESTONE) injection 12 mg    Plan: No additional findings.   Meds & Orders:  Meds ordered this encounter  Medications  . betamethasone acetate-betamethasone sodium phosphate (CELESTONE) injection 12 mg    Orders Placed This Encounter  Procedures  . XR C-ARM NO REPORT  . Epidural Steroid injection    Follow-up: Return if symptoms worsen or fail to improve.   Procedures: No procedures performed  Lumbar Epidural Steroid Injection - Interlaminar Approach with Fluoroscopic Guidance  Patient: Shelby Morgan      Date of Birth: 08-May-1956 MRN: [id number] PCP: Cory Munch, PA-C      Visit Date: 12/12/2020   Universal Protocol:     Consent Given By: the patient  Position: PRONE  Additional Comments: Vital signs were  monitored before and after the procedure. Patient was prepped and draped in the usual sterile fashion. The correct patient, procedure, and site was verified.   Injection Procedure Details:   Procedure diagnoses: Spinal stenosis of lumbar region with neurogenic claudication [M48.062]   Meds Administered:  Meds ordered this encounter  Medications  . betamethasone acetate-betamethasone sodium phosphate (CELESTONE) injection 12 mg     Laterality: Right  Location/Site:  L3-L4  Needle: 3.5 in., 20 ga. Tuohy  Needle Placement: Paramedian epidural  Findings:   -Comments: Excellent flow of contrast into the epidural space.  Procedure Details: Using a paramedian approach from the side mentioned above, the region overlying the inferior lamina was localized under fluoroscopic visualization and the soft tissues overlying this structure were infiltrated with 4 ml. of 1% Lidocaine without Epinephrine. The Tuohy needle was inserted into the epidural space using a paramedian approach.   The epidural space was localized using loss of resistance along with counter oblique bi-planar fluoroscopic views.  After negative aspirate for air, blood, and CSF, a 2 ml. volume of Isovue-250 was injected into the epidural space and the flow of contrast was observed. Radiographs were obtained for documentation purposes.    The injectate was administered into the level noted above.   Additional Comments:  The patient tolerated the procedure well Dressing: 2 x 2 sterile gauze and Band-Aid    Post-procedure details: Patient was observed during the procedure. Post-procedure instructions were reviewed.  Patient left the clinic in stable condition.     Clinical History: MRI LUMBAR SPINE WITHOUT CONTRAST  TECHNIQUE: Multiplanar, multisequence MR imaging of the lumbar spine was performed. No intravenous contrast was administered.  COMPARISON:  Lumbar spine radiographs  09/25/2020  FINDINGS: Segmentation:  5 lumbar vertebrae.  Hypoplastic ribs at T12.  Alignment: Mild lumbar levoscoliosis. Trace retrolisthesis of L2 on L3. Bilateral L5 pars defects with 8 mm anterolisthesis of L5 on S1.  Vertebrae: No fracture or suspicious osseous lesion. Prominent degenerative endplate changes asymmetrically greater on the right at L1-2 and L2-3 including degenerative edema at L2-3.  Conus medullaris and cauda equina: Conus extends to the L1 level. Conus and cauda equina appear normal.  Paraspinal and other soft tissues: Unremarkable.  Disc levels:  Disc desiccation throughout the lumbar spine. Moderate to severe disc space narrowing at L1-2, L2-3, and L5-S1 with moderate narrowing at L3-4 and mild-to-moderate narrowing at L4-5.  T12-L1: Negative.  L1-2: Right eccentric disc bulging, endplate spurring, and mild facet hypertrophy result in mild right lateral recess stenosis and mild right neural foraminal stenosis without significant spinal stenosis.  L2-3: Prominent circumferential disc bulging eccentric to the right, endplate spurring, prominent dorsal epidural fat, and severe facet and ligamentum flavum hypertrophy result in severe spinal stenosis and severe right and mild-to-moderate left neural foraminal stenosis.  L3-4: Prominent circumferential disc bulging eccentric to the right, endplate spurring, and severe facet and ligamentum flavum hypertrophy result in mild spinal stenosis and moderate right neural foraminal stenosis.  L4-5: Disc bulging eccentric to the left and moderate facet and ligamentum flavum hypertrophy result in mild bilateral lateral recess stenosis and mild right and moderate left neural foraminal stenosis without spinal stenosis.  L5-S1: Anterolisthesis with bulging uncovered disc, endplate spurring, and spurring at the pars defects result in moderate right and severe left neural foraminal stenosis without  spinal stenosis.  IMPRESSION: 1. 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 2. Severe spinal stenosis and severe right neural foraminal stenosis at L2-3. 3. Grade 2 anterolisthesis at L5-S1 due to L5 pars defects with moderate right and severe left neural foraminal stenosis. 4. Mild spinal stenosis at L3-4.   Electronically Signed   By: Logan Bores M.D.   On: 11/01/2020 16:55     Objective:  VS:  HT:    WT:   BMI:     BP:(!) 162/84  HR:80bpm  TEMP: ( )  RESP:  Physical Exam Vitals and nursing note reviewed.  Constitutional:      General: She is not in acute distress.    Appearance: Normal appearance. She is obese. She is not ill-appearing.  HENT:     Head: Normocephalic and atraumatic.     Right Ear: External ear normal.     Left Ear: External ear normal.  Eyes:     Extraocular Movements: Extraocular movements intact.  Cardiovascular:     Rate and Rhythm: Normal rate.     Pulses: Normal pulses.  Pulmonary:     Effort: Pulmonary effort is normal. No respiratory distress.  Abdominal:     General: There is no distension.     Palpations: Abdomen is soft.  Musculoskeletal:        General: Tenderness present.     Cervical back: Neck supple.     Right lower leg: No edema.     Left lower leg: No edema.     Comments: Patient has good distal strength with no pain over the greater trochanters.  No clonus or focal weakness.  Skin:    Findings: No erythema, lesion or rash.  Neurological:  General: No focal deficit present.     Mental Status: She is alert and oriented to person, place, and time.     Sensory: No sensory deficit.     Motor: No weakness or abnormal muscle tone.     Coordination: Coordination normal.  Psychiatric:        Mood and Affect: Mood normal.        Behavior: Behavior normal.      Imaging: No results found.

## 2020-12-12 NOTE — Progress Notes (Signed)
Pt state lower back pain. Pt state walking, standing and sitting makes the pain worse. Pt state she take pain meds to help ease her pain.  Numeric Pain Rating Scale and Functional Assessment Average Pain 10   In the last MONTH (on 0-10 scale) has pain interfered with the following?  1. General activity like being  able to carry out your everyday physical activities such as walking, climbing stairs, carrying groceries, or moving a chair?  Rating(10)   +Driver, -BT, -Dye Allergies.

## 2021-02-13 ENCOUNTER — Encounter: Payer: Self-pay | Admitting: *Deleted

## 2021-04-10 ENCOUNTER — Encounter (HOSPITAL_COMMUNITY): Payer: Self-pay

## 2021-04-10 ENCOUNTER — Observation Stay (HOSPITAL_COMMUNITY): Payer: 59

## 2021-04-10 ENCOUNTER — Emergency Department (HOSPITAL_COMMUNITY): Payer: 59

## 2021-04-10 ENCOUNTER — Other Ambulatory Visit: Payer: Self-pay

## 2021-04-10 ENCOUNTER — Observation Stay (HOSPITAL_COMMUNITY)
Admission: EM | Admit: 2021-04-10 | Discharge: 2021-04-11 | Disposition: A | Discharge status: Home / Self Care | Payer: 59 | Attending: Family Medicine | Admitting: Family Medicine

## 2021-04-10 DIAGNOSIS — I10 Essential (primary) hypertension: Secondary | ICD-10-CM | POA: Diagnosis not present

## 2021-04-10 DIAGNOSIS — I63532 Cerebral infarction due to unspecified occlusion or stenosis of left posterior cerebral artery: Secondary | ICD-10-CM | POA: Diagnosis not present

## 2021-04-10 DIAGNOSIS — Z85828 Personal history of other malignant neoplasm of skin: Secondary | ICD-10-CM | POA: Diagnosis not present

## 2021-04-10 DIAGNOSIS — Y900 Blood alcohol level of less than 20 mg/100 ml: Secondary | ICD-10-CM | POA: Insufficient documentation

## 2021-04-10 DIAGNOSIS — Z8673 Personal history of transient ischemic attack (TIA), and cerebral infarction without residual deficits: Secondary | ICD-10-CM

## 2021-04-10 DIAGNOSIS — E119 Type 2 diabetes mellitus without complications: Secondary | ICD-10-CM | POA: Diagnosis not present

## 2021-04-10 DIAGNOSIS — R200 Anesthesia of skin: Secondary | ICD-10-CM

## 2021-04-10 DIAGNOSIS — Z79899 Other long term (current) drug therapy: Secondary | ICD-10-CM | POA: Insufficient documentation

## 2021-04-10 DIAGNOSIS — Z7984 Long term (current) use of oral hypoglycemic drugs: Secondary | ICD-10-CM | POA: Insufficient documentation

## 2021-04-10 DIAGNOSIS — Z87891 Personal history of nicotine dependence: Secondary | ICD-10-CM | POA: Diagnosis not present

## 2021-04-10 DIAGNOSIS — Z20822 Contact with and (suspected) exposure to covid-19: Secondary | ICD-10-CM | POA: Diagnosis not present

## 2021-04-10 DIAGNOSIS — I639 Cerebral infarction, unspecified: Principal | ICD-10-CM | POA: Insufficient documentation

## 2021-04-10 LAB — RESP PANEL BY RT-PCR (FLU A&B, COVID) ARPGX2
Influenza A by PCR: NEGATIVE
Influenza B by PCR: NEGATIVE
SARS Coronavirus 2 by RT PCR: NEGATIVE

## 2021-04-10 LAB — URINALYSIS, ROUTINE W REFLEX MICROSCOPIC
Bilirubin Urine: NEGATIVE
Glucose, UA: NEGATIVE mg/dL
Hgb urine dipstick: NEGATIVE
Ketones, ur: NEGATIVE mg/dL
Leukocytes,Ua: NEGATIVE
Nitrite: NEGATIVE
Protein, ur: NEGATIVE mg/dL
Specific Gravity, Urine: 1.018 (ref 1.005–1.030)
pH: 6 (ref 5.0–8.0)

## 2021-04-10 LAB — CBC
HCT: 39.1 % (ref 36.0–46.0)
Hemoglobin: 12.5 g/dL (ref 12.0–15.0)
MCH: 28.3 pg (ref 26.0–34.0)
MCHC: 32 g/dL (ref 30.0–36.0)
MCV: 88.7 fL (ref 80.0–100.0)
Platelets: 348 10*3/uL (ref 150–400)
RBC: 4.41 MIL/uL (ref 3.87–5.11)
RDW: 13.4 % (ref 11.5–15.5)
WBC: 8 10*3/uL (ref 4.0–10.5)
nRBC: 0 % (ref 0.0–0.2)

## 2021-04-10 LAB — HEMOGLOBIN A1C
Hgb A1c MFr Bld: 6.4 % — ABNORMAL HIGH (ref 4.8–5.6)
Mean Plasma Glucose: 136.98 mg/dL

## 2021-04-10 LAB — COMPREHENSIVE METABOLIC PANEL
ALT: 31 U/L (ref 0–44)
AST: 28 U/L (ref 15–41)
Albumin: 3.8 g/dL (ref 3.5–5.0)
Alkaline Phosphatase: 63 U/L (ref 38–126)
Anion gap: 8 (ref 5–15)
BUN: 19 mg/dL (ref 8–23)
CO2: 29 mmol/L (ref 22–32)
Calcium: 9.4 mg/dL (ref 8.9–10.3)
Chloride: 100 mmol/L (ref 98–111)
Creatinine, Ser: 0.81 mg/dL (ref 0.44–1.00)
GFR, Estimated: >60 mL/min (ref >60)
Glucose, Bld: 147 mg/dL — ABNORMAL HIGH (ref 70–99)
Potassium: 4 mmol/L (ref 3.5–5.1)
Sodium: 137 mmol/L (ref 135–145)
Total Bilirubin: 0.4 mg/dL (ref 0.3–1.2)
Total Protein: 7.4 g/dL (ref 6.5–8.1)

## 2021-04-10 LAB — DIFFERENTIAL
Abs Immature Granulocytes: 0.03 10*3/uL (ref 0.00–0.07)
Basophils Absolute: 0.1 10*3/uL (ref 0.0–0.1)
Basophils Relative: 1 %
Eosinophils Absolute: 0.4 10*3/uL (ref 0.0–0.5)
Eosinophils Relative: 5 %
Immature Granulocytes: 0 %
Lymphocytes Relative: 19 %
Lymphs Abs: 1.5 10*3/uL (ref 0.7–4.0)
Monocytes Absolute: 0.8 10*3/uL (ref 0.1–1.0)
Monocytes Relative: 10 %
Neutro Abs: 5.3 10*3/uL (ref 1.7–7.7)
Neutrophils Relative %: 65 %

## 2021-04-10 LAB — ETHANOL: Alcohol, Ethyl (B): <10 mg/dL (ref <10)

## 2021-04-10 LAB — RAPID URINE DRUG SCREEN, HOSP PERFORMED
Amphetamines: NOT DETECTED
Barbiturates: NOT DETECTED
Benzodiazepines: NOT DETECTED
Cocaine: NOT DETECTED
Opiates: NOT DETECTED
Tetrahydrocannabinol: NOT DETECTED

## 2021-04-10 LAB — PROTIME-INR
INR: 1 (ref 0.8–1.2)
Prothrombin Time: 12.7 seconds (ref 11.4–15.2)

## 2021-04-10 LAB — CBG MONITORING, ED: Glucose-Capillary: 93 mg/dL (ref 70–99)

## 2021-04-10 LAB — APTT: aPTT: 28 seconds (ref 24–36)

## 2021-04-10 LAB — TSH: TSH: 2.453 u[IU]/mL (ref 0.350–4.500)

## 2021-04-10 MED ORDER — TRAZODONE HCL 50 MG PO TABS
100.0000 mg | ORAL_TABLET | Freq: Every evening | ORAL | Status: DC | PRN
  Administered 2021-04-10: 100 mg via ORAL
  Filled 2021-04-10: qty 2

## 2021-04-10 MED ORDER — ACETAMINOPHEN 650 MG RE SUPP: 650.0000 mg | RECTAL | Status: DC | PRN

## 2021-04-10 MED ORDER — ACETAMINOPHEN 160 MG/5ML PO SOLN: 650.0000 mg | ORAL | Status: DC | PRN

## 2021-04-10 MED ORDER — LINAGLIPTIN 5 MG PO TABS
5.0000 mg | ORAL_TABLET | Freq: Every day | ORAL | Status: DC
  Administered 2021-04-10 – 2021-04-11 (×2): 5 mg via ORAL
  Filled 2021-04-10 (×2): qty 1

## 2021-04-10 MED ORDER — BUSPIRONE HCL 5 MG PO TABS
5.0000 mg | ORAL_TABLET | Freq: Two times a day (BID) | ORAL | Status: DC
  Administered 2021-04-10 – 2021-04-11 (×2): 5 mg via ORAL
  Filled 2021-04-10 (×2): qty 1

## 2021-04-10 MED ORDER — HYDRALAZINE HCL 25 MG PO TABS: 25.0000 mg | ORAL_TABLET | Freq: Four times a day (QID) | ORAL | Status: DC | PRN

## 2021-04-10 MED ORDER — ACETAMINOPHEN 325 MG PO TABS: 650.0000 mg | ORAL_TABLET | Freq: Four times a day (QID) | ORAL | Status: DC | PRN

## 2021-04-10 MED ORDER — SIMVASTATIN 10 MG PO TABS
40.0000 mg | ORAL_TABLET | Freq: Every evening | ORAL | Status: DC
  Administered 2021-04-10: 40 mg via ORAL
  Filled 2021-04-10: qty 4

## 2021-04-10 MED ORDER — ENOXAPARIN SODIUM 40 MG/0.4ML IJ SOSY
40.0000 mg | PREFILLED_SYRINGE | INTRAMUSCULAR | Status: DC
  Administered 2021-04-10: 40 mg via SUBCUTANEOUS
  Filled 2021-04-10: qty 0.4

## 2021-04-10 MED ORDER — VITAMIN B-12 100 MCG PO TABS
100.0000 ug | ORAL_TABLET | Freq: Every day | ORAL | Status: DC
  Administered 2021-04-11: 100 ug via ORAL
  Filled 2021-04-10 (×2): qty 1

## 2021-04-10 MED ORDER — INSULIN ASPART 100 UNIT/ML IJ SOLN
0.0000 [IU] | Freq: Three times a day (TID) | INTRAMUSCULAR | Status: DC
  Administered 2021-04-11: 2 [IU] via SUBCUTANEOUS
  Filled 2021-04-10: qty 1

## 2021-04-10 MED ORDER — IOHEXOL 350 MG/ML SOLN
75.0000 mL | Freq: Once | INTRAVENOUS | Status: AC | PRN
  Administered 2021-04-10: 75 mL via INTRAVENOUS

## 2021-04-10 MED ORDER — SODIUM CHLORIDE 0.9 % IV SOLN: INTRAVENOUS | Status: DC

## 2021-04-10 MED ORDER — SENNOSIDES-DOCUSATE SODIUM 8.6-50 MG PO TABS: 1.0000 | ORAL_TABLET | Freq: Every evening | ORAL | Status: DC | PRN

## 2021-04-10 MED ORDER — PANTOPRAZOLE SODIUM 40 MG PO TBEC
40.0000 mg | DELAYED_RELEASE_TABLET | Freq: Every morning | ORAL | Status: DC
  Administered 2021-04-11: 40 mg via ORAL
  Filled 2021-04-10 (×2): qty 1

## 2021-04-10 MED ORDER — TRAMADOL HCL 50 MG PO TABS
50.0000 mg | ORAL_TABLET | ORAL | Status: DC | PRN
Start: 2021-04-10 — End: 2021-04-11

## 2021-04-10 MED ORDER — LORAZEPAM 0.5 MG PO TABS: 0.5000 mg | ORAL_TABLET | Freq: Four times a day (QID) | ORAL | Status: DC | PRN

## 2021-04-10 MED ORDER — ACETAMINOPHEN 325 MG PO TABS: 650.0000 mg | ORAL_TABLET | ORAL | Status: DC | PRN

## 2021-04-10 MED ORDER — STROKE: EARLY STAGES OF RECOVERY BOOK
Freq: Once | Status: DC
  Filled 2021-04-10: qty 1

## 2021-04-10 MED ORDER — ASPIRIN EC 81 MG PO TBEC
81.0000 mg | DELAYED_RELEASE_TABLET | Freq: Every day | ORAL | Status: DC
  Administered 2021-04-10 – 2021-04-11 (×2): 81 mg via ORAL
  Filled 2021-04-10 (×2): qty 1

## 2021-04-10 NOTE — ED Notes (Signed)
Pt ambulated to restroom with assistance at this time Shelby Morgan

## 2021-04-10 NOTE — H&P (Signed)
History and Physical    Shelby Morgan [id number] DOB: [birth year] DOA: 04/10/2021  PCP: Cory Munch, PA-C (Confirm with patient/family/NH records and if not entered, this has to be entered at [facility] point of entry) Patient coming from: Home  I have personally briefly reviewed patient's old medical records in [place]  Chief Complaint: Right sided numbness  HPI: Shelby Morgan is a 65 y.o. female with medical history significant of HTN, IIDM, HLD, chronic back pain, anxiety/depression, presented with new onset of right-sided numbness.  Patient started to feel right leg numbness last night around 930.  Treatment went to sleep, but woke up with persistent right leg numbness, and new onset of right arm numbness and right-sided face numbness. "Feels stiff on her right finger and wrist" but denied any weakness of any of the limbs.  No headache no blurry vision. She also complains about occasional "skipping beats in my heart", but no history of A. fib before.  At baseline, patient has mild " pins-and-needles feelings on the right toes".  She has severe back pain failed injection treatment, has been on narcotics, she was on aspirin before but decided to stop it for the wondering about interaction aspirin with her pain meds.  ED Course: Blood pressure elevated, CT head without contrast no acute findings.  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  Neurology made aware.  Review of Systems: As per HPI otherwise 14 point review of systems negative.    Past Medical History:  Diagnosis Date   Anxiety    Bell's palsy 01/08/2011   Chronic back pain    Depression    Diabetes mellitus    GERD (gastroesophageal reflux disease)    Hepatomegaly    Hypertension    Melanoma (Progress) 2009   Dr [place] Crane, Stage 2, required only surgery   NAFLD (nonalcoholic fatty liver disease)    Skin cancer     Past Surgical  History:  Procedure Laterality Date   CESAREAN SECTION     COLONOSCOPY  03/09/2011   Dr Oneida Alar diverticulosis, internal and external hemorrhoids   ESOPHAGOGASTRODUODENOSCOPY  03/09/2011   Esophageal stricture dilated to 16 MM savory, NSAID- induced gastritis and duodenitis   LEG SURGERY     plates/screws/hx fx-right leg   MELANOMA EXCISION     right knee   SAVORY DILATION  03/09/2011     reports that she quit smoking about 28 years ago. Her smoking use included cigarettes. She has a 5.50 pack-year smoking history. She has never used smokeless tobacco. She reports current alcohol use. She reports that she does not use drugs.  Allergies  Allergen Reactions   Codeine Nausea And Vomiting    Family History  Problem Relation Age of Onset   Heart disease Mother    Heart disease Maternal Grandmother    Heart disease Maternal Grandfather    COPD Father    Bladder Cancer Father    Anesthesia problems Neg Hx    Hypotension Neg Hx    Malignant hyperthermia Neg Hx    Pseudochol deficiency Neg Hx    Colon cancer Neg Hx      Prior to Admission medications   Medication Sig Start Date End Date Taking? Authorizing Provider  acetaminophen (TYLENOL) 325 MG tablet Take 650 mg by mouth every 6 (six) hours as needed for moderate pain or mild pain.   Yes [provider]  busPIRone (BUSPAR)  5 MG tablet Take 5 mg by mouth 2 (two) times daily. 06/13/18  Yes [provider]  cyanocobalamin 100 MCG tablet Take 100 mcg by mouth daily.   Yes [provider]  furosemide (LASIX) 20 MG tablet Take 20 mg by mouth every morning.   Yes [provider]  lisinopril (PRINIVIL,ZESTRIL) 10 MG tablet Take 10 mg by mouth every morning.  05/16/18  Yes [provider]  meloxicam (MOBIC) 15 MG tablet Take 15 mg by mouth every morning.   Yes [provider]  metFORMIN (GLUCOPHAGE-XR) 500 MG 24 hr tablet Take 1,000 mg by mouth 2 (two) times daily.    Yes [provider]  naproxen sodium (ALEVE) 220 MG tablet Take 220 mg by mouth 2 (two) times daily as needed (for back pain).   Yes [provider]  Omega-3 Fatty Acids (FISH OIL) 1000 MG CAPS Take 1 capsule by mouth daily at 6 (six) AM.   Yes [provider]  pantoprazole (PROTONIX) 40 MG tablet TAKE ONE TABLET BY MOUTH ONCE DAILY Patient taking differently: Take 40 mg by mouth every morning. 07/12/15  Yes Mahala Menghini, PA-C  simvastatin (ZOCOR) 40 MG tablet Take 40 mg by mouth every evening.   Yes [provider]  sitaGLIPtin (JANUVIA) 100 MG tablet Take 100 mg by mouth every morning.   Yes [provider]  traMADol (ULTRAM) 50 MG tablet Take 50 mg by mouth every 4 (four) hours as needed. 04/08/21  Yes [provider]  traZODone (DESYREL) 100 MG tablet Take 100 mg by mouth at bedtime as needed for sleep. 06/13/18  Yes [provider]  Multiple Vitamins-Minerals (MULTI-BETIC DIABETES) TABS Take 2 tablets by mouth daily.    [provider]    Physical Exam: Vitals:   04/10/21 1456 04/10/21 1501 04/10/21 1506 04/10/21 1546  BP:  (!) 170/82  (!) 182/76  Pulse: 74 73 71 72  Resp: (!) '26 18 14 10  '$ Temp:      TempSrc:      SpO2: 94% 93% 97% 93%  Weight:      Height:        Constitutional: NAD, calm, comfortable Vitals:   04/10/21 1456 04/10/21 1501 04/10/21 1506 04/10/21 1546  BP:  (!) 170/82  (!) 182/76  Pulse: 74 73 71 72  Resp: (!) '26 18 14 10  '$ Temp:      TempSrc:      SpO2: 94% 93% 97% 93%  Weight:      Height:       Eyes: PERRL, lids and conjunctivae normal ENMT: Mucous membranes are moist. Posterior pharynx clear of any exudate or lesions.Normal dentition.  Neck: normal, supple, no masses, no thyromegaly Respiratory: clear to auscultation bilaterally, no wheezing, no crackles. Normal respiratory effort. No accessory muscle use.  Cardiovascular: Regular rate and rhythm, no murmurs / rubs / gallops. No extremity  edema. 2+ pedal pulses. No carotid bruits.  Abdomen: no tenderness, no masses palpated. No hepatosplenomegaly. Bowel sounds positive.  Musculoskeletal: no clubbing / cyanosis. No joint deformity upper and lower extremities. Good ROM, no contractures. Normal muscle tone.  Skin: no rashes, lesions, ulcers. No induration Neurologic: No facial droops or tongue deviation.  Light touch sensation decreased on right face, right arm and right hand and fingers as well as the right leg and right foot compared to the left side, DTR normal. Strength 5/5 in all 4.  Psychiatric: Normal judgment and insight. Alert and oriented x 3.  Normal mood.     Labs on Admission: I have personally reviewed following labs and imaging studies  CBC: Recent Labs  Lab 04/10/21 1013  WBC 8.0  NEUTROABS 5.3  HGB 12.5  HCT 39.1  MCV 88.7  PLT 0000000   Basic Metabolic Panel: Recent Labs  Lab 04/10/21 1013  NA 137  K 4.0  CL 100  CO2 29  GLUCOSE 147*  BUN 19  CREATININE 0.81  CALCIUM 9.4   GFR: Estimated Creatinine Clearance: 78.3 mL/min (by C-G formula based on SCr of 0.81 mg/dL). Liver Function Tests: Recent Labs  Lab 04/10/21 1013  AST 28  ALT 31  ALKPHOS 63  BILITOT 0.4  PROT 7.4  ALBUMIN 3.8   No results for input(s): LIPASE, AMYLASE in the last 168 hours. No results for input(s): AMMONIA in the last 168 hours. Coagulation Profile: Recent Labs  Lab 04/10/21 1013  INR 1.0   Cardiac Enzymes: No results for input(s): CKTOTAL, CKMB, CKMBINDEX, TROPONINI in the last 168 hours. BNP (last 3 results) No results for input(s): PROBNP in the last 8760 hours. HbA1C: No results for input(s): HGBA1C in the last 72 hours. CBG: No results for input(s): GLUCAP in the last 168 hours. Lipid Profile: No results for input(s): CHOL, HDL, LDLCALC, TRIG, CHOLHDL, LDLDIRECT in the last 72 hours. Thyroid Function Tests: No results for input(s): TSH, T4TOTAL, FREET4, T3FREE, THYROIDAB in the last 72  hours. Anemia Panel: No results for input(s): VITAMINB12, FOLATE, FERRITIN, TIBC, IRON, RETICCTPCT in the last 72 hours. Urine analysis:    Component Value Date/Time   COLORURINE STRAW (A) 04/10/2021 1301   APPEARANCEUR CLEAR 04/10/2021 1301   LABSPEC 1.018 04/10/2021 1301   PHURINE 6.0 04/10/2021 1301   GLUCOSEU NEGATIVE 04/10/2021 1301   HGBUR NEGATIVE 04/10/2021 1301   BILIRUBINUR NEGATIVE 04/10/2021 1301   KETONESUR NEGATIVE 04/10/2021 1301   PROTEINUR NEGATIVE 04/10/2021 1301   UROBILINOGEN 0.2 01/08/2011 1649   NITRITE NEGATIVE 04/10/2021 1301   LEUKOCYTESUR NEGATIVE 04/10/2021 1301    Radiological Exams on Admission: CT Angio Head W or Wo Contrast  Result Date: 04/10/2021 CLINICAL DATA:  Acute neuro deficit. Right-sided decreased sensation. EXAM: CT ANGIOGRAPHY HEAD AND NECK TECHNIQUE: Multidetector CT imaging of the head and neck was performed using the standard protocol during bolus administration of intravenous contrast. Multiplanar CT image reconstructions and MIPs were obtained to evaluate the vascular anatomy. Carotid stenosis measurements (when applicable) are obtained utilizing NASCET criteria, using the distal internal carotid diameter as the denominator. CONTRAST:  24m OMNIPAQUE IOHEXOL 350 MG/ML SOLN COMPARISON:  MRI head 01/08/2011 FINDINGS: CT HEAD FINDINGS Brain: No evidence of acute infarction, hemorrhage, hydrocephalus, extra-axial collection or mass lesion/mass effect. Vascular: Negative for hyperdense vessel Skull: Negative Sinuses: Paranasal sinuses clear. Orbits: Bilateral cataract extraction Review of the MIP images confirms the above findings CTA NECK FINDINGS Aortic arch: Standard branching. Imaged portion shows no evidence of aneurysm or dissection. No significant stenosis of the major arch vessel origins. Mild atherosclerotic calcification aortic arch. Right carotid system: No significant right carotid stenosis. Mild atherosclerotic calcification proximal right  internal carotid artery Left carotid system: No significant left carotid stenosis. Mild atherosclerotic disease left carotid bifurcation and internal carotid artery. Vertebral arteries: Both vertebral arteries widely patent. No significant stenosis. 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 Disc degeneration and spurring in the cervical spine most prominent C5-6. Multilevel facet degeneration. No acute skeletal abnormality. Other neck: No acute soft tissue abnormality. Upper chest: Lung apices clear bilaterally. Review of the MIP images confirms  the above findings CTA HEAD FINDINGS Anterior circulation: Atherosclerotic calcification in the cavernous carotid bilaterally without significant stenosis. Anterior and middle cerebral arteries patent without significant stenosis. Posterior circulation: Both vertebral arteries patent to the basilar. PICA patent bilaterally. Basilar widely patent. Occlusion proximal right posterior cerebral artery of indeterminate age. Moderate stenosis left P2 branch. Occluded left P3 branch . Venous sinuses: Normal venous enhancement Anatomic variants: None Review of the MIP images confirms the above findings IMPRESSION: 1. CT head negative for acute abnormality 2. Occlusion proximal right posterior cerebral artery of indeterminate age. Correlate with symptoms. 3. Moderate stenosis left P2 segment. Occluded left P3 branch. Correlate with symptoms and MRI. 4. These results were called by telephone at the time of interpretation on 04/10/2021 at 1:04 pm to provider [facility], who verbally acknowledged these results. Electronically Signed   By: Franchot Gallo M.D.   On: 04/10/2021 13:05   CT Angio Neck W and/or Wo Contrast  Result Date: 04/10/2021 CLINICAL DATA:  Acute neuro deficit. Right-sided decreased sensation. EXAM: CT ANGIOGRAPHY HEAD AND NECK TECHNIQUE: Multidetector CT imaging of the head and neck was performed using the standard protocol during bolus administration of intravenous contrast.  Multiplanar CT image reconstructions and MIPs were obtained to evaluate the vascular anatomy. Carotid stenosis measurements (when applicable) are obtained utilizing NASCET criteria, using the distal internal carotid diameter as the denominator. CONTRAST:  28m OMNIPAQUE IOHEXOL 350 MG/ML SOLN COMPARISON:  MRI head 01/08/2011 FINDINGS: CT HEAD FINDINGS Brain: No evidence of acute infarction, hemorrhage, hydrocephalus, extra-axial collection or mass lesion/mass effect. Vascular: Negative for hyperdense vessel Skull: Negative Sinuses: Paranasal sinuses clear. Orbits: Bilateral cataract extraction Review of the MIP images confirms the above findings CTA NECK FINDINGS Aortic arch: Standard branching. Imaged portion shows no evidence of aneurysm or dissection. No significant stenosis of the major arch vessel origins. Mild atherosclerotic calcification aortic arch. Right carotid system: No significant right carotid stenosis. Mild atherosclerotic calcification proximal right internal carotid artery Left carotid system: No significant left carotid stenosis. Mild atherosclerotic disease left carotid bifurcation and internal carotid artery. Vertebral arteries: Both vertebral arteries widely patent. No significant stenosis. 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 Disc degeneration and spurring in the cervical spine most prominent C5-6. Multilevel facet degeneration. No acute skeletal abnormality. Other neck: No acute soft tissue abnormality. Upper chest: Lung apices clear bilaterally. Review of the MIP images confirms the above findings CTA HEAD FINDINGS Anterior circulation: Atherosclerotic calcification in the cavernous carotid bilaterally without significant stenosis. Anterior and middle cerebral arteries patent without significant stenosis. Posterior circulation: Both vertebral arteries patent to the basilar. PICA patent bilaterally. Basilar widely patent. Occlusion proximal right posterior cerebral artery of indeterminate age.  Moderate stenosis left P2 branch. Occluded left P3 branch . Venous sinuses: Normal venous enhancement Anatomic variants: None Review of the MIP images confirms the above findings IMPRESSION: 1. CT head negative for acute abnormality 2. Occlusion proximal right posterior cerebral artery of indeterminate age. Correlate with symptoms. 3. Moderate stenosis left P2 segment. Occluded left P3 branch. Correlate with symptoms and MRI. 4. These results were called by telephone at the time of interpretation on 04/10/2021 at 1:04 pm [facility] who verbally acknowledged these results. Electronically Signed   By: CFranchot GalloM.D.   On: 04/10/2021 13:05    EKG: Independently reviewed. Sinus, LVH  Assessment/Plan Active Problems:   CV[place] (cerebral vascular accident) ([place]  (please populate well all problems here in Problem List. (For example, if patient is on BP meds at home and you  resume or decide to hold them, it is a problem that needs to be her. Same for CAD, COPD, HLD and so on)  CVA -With right sided paresthesia -Telemetry neurology reviewed CT angiogram report, impression is the left P2 and P3 lesions were too small in caliber for intervention.  Not a candidate for tPA given low stroke score. As there is no CT signs of bleeding, will start aspirin.  Continue statin. -MRI, echocardiogram, telemetry monitoring x24 hours. -allow permissive hypertension for 48 hours -PT evaluation  IIDM -Hold metformin for 72 hours -Continue Amaryl and add sliding scale  HTN -Hold home BP meds, PRN Hydralazine  Palpitations -Tele x24 hours, if negative, recommend her to go see outpatient cardiology -TSH  Chronic back pain -Outpatient follow-up with pain management.  DVT prophylaxis: Lovenox Code Status: Full Code Family Communication: None at bedside Disposition Plan: Expect less than 2 midnight hospitalization Consults called: Tele neural Admission status: Tele obs   Lequita Halt MD [facility] Pager [phone]  04/10/2021, 4:01 PM

## 2021-04-10 NOTE — ED Notes (Signed)
Unable to obtain vital signs - patient in CT

## 2021-04-10 NOTE — Progress Notes (Signed)
MRI resulted, D/W patient about the result, suspecting underlying uncontrolled HTN.

## 2021-04-10 NOTE — Consult Note (Addendum)
Addendum CTA IMPRESSION:  1. CT head negative for acute abnormality 2. Occlusion proximal right posterior cerebral artery of indeterminate age. Correlate with symptoms. 3. Moderate stenosis left P2 segment. Occluded left P3 branch. Correlate with symptoms and MRI MRA of the head and neck. ED MD updated. Not a candidate for NIR based on low NIH of one   [facility] TeleNeurology Consult Services  Stat Consult  Date of Service:   04/10/2021 11:32:30  Diagnosis:       I63.00 - Cerebrovascular accident (CVA) due to thrombosis of precerebral artery ([place])  Impression Patient presented with right-sided numbness that started yesterday evening. Her blood pressure was markedly elevated initially. Symptoms could be due to a lacunar left brain stroke versus small bleed. The plain CT has not been done yet I discussed the case with the ER provider and recommended a stat CT of the head to make sure there is no small bleed. I would also suggest doing a CTA of the head and neck and if the studies are negative the plan should be to admit her to hospital for an MRI of the head. I suggested not to give her aspirin right now until the plain CT is negative for a bleed. I also suggested to get the CT done as soon as possible. Please feel free to call us if you have any further questions. Patient should be admitted to the hospital as long as the CT is negative for a bleed, for further MRI of the head and stroke work-up. She will need PT OT and speech eval's and permissive hypertension if the CT is negative for bleed.  CT HEAD:  not done yet  Advanced Imaging: CTA Head and Neck Completed.  LVO: will follow  Patient doesn't meet criteria for emergent NIR consideration   Our recommendations are outlined below.  Diagnostic Studies: Recommend MRI brain without contrast  Laboratory Studies: Recommend Lipid panel Hemoglobin A1c  Nursing Recommendations: Telemetry, IV Fluids, avoid  dextrose containing fluids, Maintain euglycemia Neuro checks q4 hrs x 24 hrs and then per shift Head of bed 30 degrees  Consultations: Recommend Speech therapy if failed dysphagia screen Physical therapy/Occupational therapy  DVT Prophylaxis: Choice of Primary Team   Metrics: [facility] Notification Time: 04/10/2021 11:30:16 Stamp Time: 04/10/2021 11:32:30 Callback Response Time: 04/10/2021 11:33:02   ----------------------------------------------------------------------------------------------------  Chief Complaint: Right leg and arm, right-sided facial numbness  History of Present Illness: Patient is a 65 year old Female.  17-year female presented to ED with right-sided arm numbness that started yesterday evening. LKW was 21:00 the previous night. She woke with right leg and right facial numbness in addition to the right arm numbness. She reports the right arm numbness was there, before she went to sleep. The patient reported that her symptoms are getting worse. The patient denies any headache, neck pain, nausea, or vomiting. No history of head injury/trauma to the head was reported. There are no speech difficulties or visual disturbances reported. The Patient is not a on any antiplatelets.She had a past medical history of melanoma, fatty liver, anxiety, and GERD.    Past Medical History: Hypertension Diabetes Mellitus Hyperlipidemia Melanoma, Fatty liver, Anxiety, GERD, Lower back pain  Anticoagulant use:  No  Antiplatelet use: No    Examination: BP(174/92), Pulse(63), Blood Glucose(147) 1A: Level of Consciousness - Alert; keenly responsive + 0 1B: Ask Month and Age - Both Questions Right + 0 1C: Blink Eyes & Squeeze Hands - Performs Both Tasks + 0 2: Test Horizontal Extraocular Movements -  Normal + 0 3: Test Visual Fields - No Visual Loss + 0 4: Test Facial Palsy (Use Grimace if Obtunded) - Normal symmetry + 0 5A: Test Left Arm Motor Drift - No Drift for 10  Seconds + 0 5B: Test Right Arm Motor Drift - No Drift for 10 Seconds + 0 6A: Test Left Leg Motor Drift - No Drift for 5 Seconds + 0 6B: Test Right Leg Motor Drift - No Drift for 5 Seconds + 0 7: Test Limb Ataxia (FNF/Heel-Shin) - No Ataxia + 0 8: Test Sensation - Mild-Moderate Loss: Less Sharp/More Dull + 1 9: Test Language/Aphasia - Normal; No aphasia + 0 10: Test Dysarthria - Normal + 0 11: Test Extinction/Inattention - No abnormality + 0  NIHSS Score: 1     Patient / Family was informed the Neurology Consult would occur via TeleHealth consult by way of interactive audio and video telecommunications and consented to receiving care in this manner.  Patient is being evaluated for possible acute neurologic impairment and high probability of imminent or life - threatening deterioration.I spent total of 30 minutes providing care to this patient, including time for face to face visit via telemedicine, review of medical records, imaging studies and discussion of findings with providers, the patient and / or family.   Dr Faustino Congress   [facility] [phone]  Case [id number]

## 2021-04-10 NOTE — ED Notes (Signed)
Patient transported to CT 

## 2021-04-10 NOTE — ED Notes (Signed)
Pt ambulated to the bathroom.  

## 2021-04-10 NOTE — ED Triage Notes (Signed)
Pt to er, pt states that she is here for decreased sensation on her R side. States that she has some spinal problems with a pinched nerve; however, she started having some decreased sensation in her R arm last night and then her leg was numb when she woke this am.  Pt oriented, moving all extremities, equal strength bilaterally, no facial droop.  Pt states that her last known well was 9pm

## 2021-04-10 NOTE — ED Provider Notes (Signed)
[facility] Provider Note   CSN: [id number] Arrival date & time: 04/10/21  [id number]     History Chief Complaint  Patient presents with   Numbness    Shelby Morgan is a 65 y.o. female with a history of type 2 diabetes, hypertension, history of Bell's palsy presenting with a 12-hour history of right-sided numbness.  Before bed last night she noted she had numbness in her right arm described as sensation like her the arm was asleep, she felt this a little bit in her upper right leg as well but proceeded to bed.  This morning when she woke her entire right side including her face arm and leg feel numb.  She denies weakness, she has felt a little off balance this morning but has had no difficulty ambulating.  She denies headache, nausea or vomiting.  She does have a history of lumbar radiculopathy which she initially thought might be the source of her symptoms therefore did not seek emergent care.  She has had no treatment prior to arrival.  The history is provided by the patient.      Past Medical History:  Diagnosis Date   Anxiety    Bell's palsy 01/08/2011   Chronic back pain    Depression    Diabetes mellitus    GERD (gastroesophageal reflux disease)    Hepatomegaly    Hypertension    Melanoma ([place]) 2009   Dr [place] Crane, Stage 2, required only surgery   NAFLD (nonalcoholic fatty liver disease)    Skin cancer     Patient Active Problem List   Diagnosis Date Noted   History of melanoma 06/20/2018   Chest pain 06/06/2018   Hypertension associated with diabetes ([place]) 06/06/2018   Hyperlipidemia associated with type 2 diabetes mellitus ([place]) 06/06/2018   Mediastinal adenopathy 06/06/2018   Elevated LFTs 05/02/2012   Lactic acidosis 05/02/2012   Cellulitis of right leg 05/01/2012   Diabetes mellitus type 2 in obese ([place]) 05/01/2012   Hypokalemia 05/01/2012   Hyponatremia 05/01/2012   NAFLD (nonalcoholic fatty liver disease) 05/14/2011   Obesity 05/14/2011    Hemorrhoids 05/14/2011   Transaminitis 01/29/2011   Hepatomegaly 01/29/2011   Rectal bleed 01/29/2011   Dysphagia 01/29/2011   RUQ pain 01/29/2011   Hypercalcemia 01/29/2011   Anxiety 01/29/2011   GERD (gastroesophageal reflux disease) 01/29/2011    Past Surgical History:  Procedure Laterality Date   CESAREAN SECTION     COLONOSCOPY  03/09/2011   Dr Oneida Alar diverticulosis, internal and external hemorrhoids   ESOPHAGOGASTRODUODENOSCOPY  03/09/2011   Esophageal stricture dilated to 16 MM savory, NSAID- induced gastritis and duodenitis   LEG SURGERY     plates/screws/hx fx-right leg   MELANOMA EXCISION     right knee   SAVORY DILATION  03/09/2011     OB History   No obstetric history on file.     Family History  Problem Relation Age of Onset   Heart disease Mother    Heart disease Maternal Grandmother    Heart disease Maternal Grandfather    COPD Father    Bladder Cancer Father    Anesthesia problems Neg Hx    Hypotension Neg Hx    Malignant hyperthermia Neg Hx    Pseudochol deficiency Neg Hx    Colon cancer Neg Hx     Social History   Tobacco Use   Smoking status: Former    Packs/day: 0.50    Years: 11.00    Pack years: 5.50  Types: Cigarettes    Quit date: 03/03/1993    Years since quitting: 28.1   Smokeless tobacco: Never  Vaping Use   Vaping Use: Never used  Substance Use Topics   Alcohol use: Yes    Comment: wine 2-3 glasses per weekend   Drug use: No    Home Medications Prior to Admission medications   Medication Sig Start Date End Date Taking? Authorizing Provider  acetaminophen (TYLENOL) 325 MG tablet Take 650 mg by mouth every 6 (six) hours as needed for moderate pain or mild pain.   Yes [provider]  busPIRone (BUSPAR) 5 MG tablet Take 5 mg by mouth 2 (two) times daily. 06/13/18  Yes [provider]  cyanocobalamin 100 MCG tablet Take 100 mcg by mouth daily.   Yes [provider]  furosemide (LASIX) 20 MG tablet  Take 20 mg by mouth every morning.   Yes [provider]  lisinopril (PRINIVIL,ZESTRIL) 10 MG tablet Take 10 mg by mouth every morning.  05/16/18  Yes [provider]  meloxicam (MOBIC) 15 MG tablet Take 15 mg by mouth every morning.   Yes [provider]  metFORMIN (GLUCOPHAGE-XR) 500 MG 24 hr tablet Take 1,000 mg by mouth 2 (two) times daily.    Yes [provider]  naproxen sodium (ALEVE) 220 MG tablet Take 220 mg by mouth 2 (two) times daily as needed (for back pain).   Yes [provider]  Omega-3 Fatty Acids (FISH OIL) 1000 MG CAPS Take 1 capsule by mouth daily at 6 (six) AM.   Yes [provider]  pantoprazole (PROTONIX) 40 MG tablet TAKE ONE TABLET BY MOUTH ONCE DAILY Patient taking differently: Take 40 mg by mouth every morning. 07/12/15  Yes Mahala Menghini, PA-C  simvastatin (ZOCOR) 40 MG tablet Take 40 mg by mouth every evening.   Yes [provider]  sitaGLIPtin (JANUVIA) 100 MG tablet Take 100 mg by mouth every morning.   Yes [provider]  traMADol (ULTRAM) 50 MG tablet Take 50 mg by mouth every 4 (four) hours as needed. 04/08/21  Yes [provider]  traZODone (DESYREL) 100 MG tablet Take 100 mg by mouth at bedtime as needed for sleep. 06/13/18  Yes [provider]  Multiple Vitamins-Minerals (MULTI-BETIC DIABETES) TABS Take 2 tablets by mouth daily.    [provider]    Allergies    Codeine  Review of Systems   Review of Systems  Constitutional:  Negative for fever.  HENT: Negative.    Eyes: Negative.   Respiratory:  Negative for chest tightness and shortness of breath.   Cardiovascular:  Negative for chest pain and palpitations.  Gastrointestinal:  Negative for abdominal pain and nausea.  Genitourinary: Negative.   Musculoskeletal:  Positive for back pain. Negative for joint swelling and neck pain.  Skin: Negative.  Negative for rash and wound.  Neurological:  Positive  for numbness. Negative for dizziness, facial asymmetry, speech difficulty, weakness, light-headedness and headaches.  Psychiatric/Behavioral: Negative.    All other systems reviewed and are negative.  Physical Exam Updated Vital Signs BP (!) 182/76   Pulse 72   Temp 98.1 F (36.7 C)   Resp 10   Ht '5\' 2"'$  (1.575 m)   Wt 101.6 kg   SpO2 93%   BMI 40.97 kg/m   Physical Exam Vitals and nursing note reviewed.  Constitutional:      Appearance: She is well-developed.  HENT:     Head: Normocephalic and  atraumatic.  Eyes:     General: No visual field deficit.    Conjunctiva/sclera: Conjunctivae normal.  Cardiovascular:     Rate and Rhythm: Normal rate and regular rhythm.     Heart sounds: Normal heart sounds.  Pulmonary:     Effort: Pulmonary effort is normal.     Breath sounds: Normal breath sounds. No wheezing.  Abdominal:     General: Bowel sounds are normal.     Palpations: Abdomen is soft.     Tenderness: There is no abdominal tenderness.  Musculoskeletal:        General: Normal range of motion.     Cervical back: Normal range of motion.  Skin:    General: Skin is warm and dry.  Neurological:     Mental Status: She is alert and oriented to person, place, and time.     Cranial Nerves: Cranial nerves are intact. No dysarthria.     Sensory: Sensory deficit present.     Motor: No abnormal muscle tone.     Coordination: Finger-Nose-Finger Test normal. Rapid alternating movements normal.     Deep Tendon Reflexes:     Reflex Scores:      Bicep reflexes are 1+ on the right side and 1+ on the left side.    Comments: Decreased sensation right face, arm and leg.  No facial droop. Equal grip strength, no pronator drift.      ED Results / Procedures / Treatments   Labs (all labs ordered are listed, but only abnormal results are displayed) Labs Reviewed  COMPREHENSIVE METABOLIC PANEL - Abnormal; Notable for the following components:      Result Value   Glucose, Bld 147 (*)     All other components within normal limits  URINALYSIS, ROUTINE W REFLEX MICROSCOPIC - Abnormal; Notable for the following components:   Color, Urine STRAW (*)    All other components within normal limits  RESP PANEL BY RT-PCR (FLU A&B, COVID) ARPGX2  ETHANOL  PROTIME-INR  APTT  CBC  DIFFERENTIAL  RAPID URINE DRUG SCREEN, HOSP PERFORMED    EKG EKG Interpretation  Date/Time:  Thursday April 10 2021 11:29:07 EDT Ventricular Rate:  71 PR Interval:  181 QRS Duration: 100 QT Interval:  387 QTC Calculation: 421 R Axis:   2 Text Interpretation: Sinus rhythm Low voltage, precordial leads Abnormal R-wave progression, early transition Left ventricular hypertrophy No significant change since last tracing Confirmed by Isla Pence [phone]) on 04/10/2021 11:44:18 AM  Radiology CT Angio Head W or Wo Contrast  Result Date: 04/10/2021 CLINICAL DATA:  Acute neuro deficit. Right-sided decreased sensation. EXAM: CT ANGIOGRAPHY HEAD AND NECK TECHNIQUE: Multidetector CT imaging of the head and neck was performed using the standard protocol during bolus administration of intravenous contrast. Multiplanar CT image reconstructions and MIPs were obtained to evaluate the vascular anatomy. Carotid stenosis measurements (when applicable) are obtained utilizing NASCET criteria, using the distal internal carotid diameter as the denominator. CONTRAST:  58m OMNIPAQUE IOHEXOL 350 MG/ML SOLN COMPARISON:  MRI head 01/08/2011 FINDINGS: CT HEAD FINDINGS Brain: No evidence of acute infarction, hemorrhage, hydrocephalus, extra-axial collection or mass lesion/mass effect. Vascular: Negative for hyperdense vessel Skull: Negative Sinuses: Paranasal sinuses clear. Orbits: Bilateral cataract extraction Review of the MIP images confirms the above findings CTA NECK FINDINGS Aortic arch: Standard branching. Imaged portion shows no evidence of aneurysm or dissection. No significant stenosis of the major arch vessel origins. Mild  atherosclerotic calcification aortic arch. Right carotid system: No significant right carotid stenosis. Mild atherosclerotic  calcification proximal right internal carotid artery Left carotid system: No significant left carotid stenosis. Mild atherosclerotic disease left carotid bifurcation and internal carotid artery. Vertebral arteries: Both vertebral arteries widely patent. No significant stenosis. 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 Disc degeneration and spurring in the cervical spine most prominent C5-6. Multilevel facet degeneration. No acute skeletal abnormality. Other neck: No acute soft tissue abnormality. Upper chest: Lung apices clear bilaterally. Review of the MIP images confirms the above findings CTA HEAD FINDINGS Anterior circulation: Atherosclerotic calcification in the cavernous carotid bilaterally without significant stenosis. Anterior and middle cerebral arteries patent without significant stenosis. Posterior circulation: Both vertebral arteries patent to the basilar. PICA patent bilaterally. Basilar widely patent. Occlusion proximal right posterior cerebral artery of indeterminate age. Moderate stenosis left P2 branch. Occluded left P3 branch . Venous sinuses: Normal venous enhancement Anatomic variants: None Review of the MIP images confirms the above findings IMPRESSION: 1. CT head negative for acute abnormality 2. Occlusion proximal right posterior cerebral artery of indeterminate age. Correlate with symptoms. 3. Moderate stenosis left P2 segment. Occluded left P3 branch. Correlate with symptoms and MRI. 4. These results were called by telephone at the time of interpretation on 04/10/2021 at 1:04 pm to provider [facility], who verbally acknowledged these results. Electronically Signed   By: Franchot Gallo M.D.   On: 04/10/2021 13:05   CT Angio Neck W and/or Wo Contrast  Result Date: 04/10/2021 CLINICAL DATA:  Acute neuro deficit. Right-sided decreased sensation. EXAM: CT ANGIOGRAPHY HEAD AND  NECK TECHNIQUE: Multidetector CT imaging of the head and neck was performed using the standard protocol during bolus administration of intravenous contrast. Multiplanar CT image reconstructions and MIPs were obtained to evaluate the vascular anatomy. Carotid stenosis measurements (when applicable) are obtained utilizing NASCET criteria, using the distal internal carotid diameter as the denominator. CONTRAST:  76m OMNIPAQUE IOHEXOL 350 MG/ML SOLN COMPARISON:  MRI head 01/08/2011 FINDINGS: CT HEAD FINDINGS Brain: No evidence of acute infarction, hemorrhage, hydrocephalus, extra-axial collection or mass lesion/mass effect. Vascular: Negative for hyperdense vessel Skull: Negative Sinuses: Paranasal sinuses clear. Orbits: Bilateral cataract extraction Review of the MIP images confirms the above findings CTA NECK FINDINGS Aortic arch: Standard branching. Imaged portion shows no evidence of aneurysm or dissection. No significant stenosis of the major arch vessel origins. Mild atherosclerotic calcification aortic arch. Right carotid system: No significant right carotid stenosis. Mild atherosclerotic calcification proximal right internal carotid artery Left carotid system: No significant left carotid stenosis. Mild atherosclerotic disease left carotid bifurcation and internal carotid artery. Vertebral arteries: Both vertebral arteries widely patent. No significant stenosis. 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 Disc degeneration and spurring in the cervical spine most prominent C5-6. Multilevel facet degeneration. No acute skeletal abnormality. Other neck: No acute soft tissue abnormality. Upper chest: Lung apices clear bilaterally. Review of the MIP images confirms the above findings CTA HEAD FINDINGS Anterior circulation: Atherosclerotic calcification in the cavernous carotid bilaterally without significant stenosis. Anterior and middle cerebral arteries patent without significant stenosis. Posterior circulation: Both  vertebral arteries patent to the basilar. PICA patent bilaterally. Basilar widely patent. Occlusion proximal right posterior cerebral artery of indeterminate age. Moderate stenosis left P2 branch. Occluded left P3 branch . Venous sinuses: Normal venous enhancement Anatomic variants: None Review of the MIP images confirms the above findings IMPRESSION: 1. CT head negative for acute abnormality 2. Occlusion proximal right posterior cerebral artery of indeterminate age. Correlate with symptoms. 3. Moderate stenosis left P2 segment. Occluded left P3 branch. Correlate with symptoms and MRI. 4. These results were called  by telephone at the time of interpretation on 04/10/2021 at 1:04 pm to provider [facility], who verbally acknowledged these results. Electronically Signed   By: Franchot Gallo M.D.   On: 04/10/2021 13:05    Procedures Procedures   Medications Ordered in ED Medications  iohexol (OMNIPAQUE) 350 MG/ML injection 75 mL (75 mLs Intravenous Contrast Given 04/10/21 1228)    ED Course  I have reviewed the triage vital signs and the nursing notes.  Pertinent labs & imaging results that were available during my care of the patient were reviewed by me and considered in my medical decision making (see chart for details).  Clinical Course as of 04/10/21 1458  Thu Apr 10, 2021  1207 Pt has had her teleneurology consult, Dr. Mike Craze.  Recommends CTA head and neck in addition to plain CT to assess for small bleed.  Hold ASA pending this result.  Would plan admission for further cva work up.  [JI]    Clinical Course User Index [JI] Landis Martins   MDM Rules/Calculators/A&P                           Labs and imaging reviewed. Pt has a occluded left PCA branch.  Further consult per phone with Dr. Mike Craze, location of occlusion not amenable to intervention.  Discussed also with [facility] neurology Dr. Fuller Plan who agrees with this - pt will need admission for further cva work up but can remain at [facility] for  this.  Discussed findings with pt and questions answered. No worsening sx here.    Discussed patient with Dr. Wynetta Fines with hospitalist service who accepts patient for admission. Final Clinical Impression(s) / ED Diagnoses Final diagnoses:  Cerebrovascular accident (CVA) due to occlusion of left posterior cerebral artery ([place])  Numbness    Rx / DC Orders ED Discharge Orders     None        Landis Martins 04/10/21 1612    Isla Pence, MD 04/15/21 1606

## 2021-04-10 NOTE — ED Notes (Signed)
Patient transported to MRI 

## 2021-04-10 NOTE — ED Notes (Signed)
Pt ambulated to restroom. Urine sample provided.

## 2021-04-10 NOTE — ED Notes (Signed)
Pt given dinner tray.

## 2021-04-11 ENCOUNTER — Observation Stay (HOSPITAL_BASED_OUTPATIENT_CLINIC_OR_DEPARTMENT_OTHER): Payer: 59

## 2021-04-11 DIAGNOSIS — I63432 Cerebral infarction due to embolism of left posterior cerebral artery: Secondary | ICD-10-CM | POA: Diagnosis not present

## 2021-04-11 DIAGNOSIS — I6389 Other cerebral infarction: Secondary | ICD-10-CM

## 2021-04-11 DIAGNOSIS — I639 Cerebral infarction, unspecified: Secondary | ICD-10-CM | POA: Diagnosis not present

## 2021-04-11 LAB — HEMOGLOBIN A1C
Hgb A1c MFr Bld: 6.4 % — ABNORMAL HIGH (ref 4.8–5.6)
Mean Plasma Glucose: 136.98 mg/dL

## 2021-04-11 LAB — LIPID PANEL
Cholesterol: 143 mg/dL (ref 0–200)
HDL: 42 mg/dL (ref >40)
LDL Cholesterol: 52 mg/dL (ref 0–99)
Total CHOL/HDL Ratio: 3.4 RATIO
Triglycerides: 244 mg/dL — ABNORMAL HIGH (ref <150)
VLDL: 49 mg/dL — ABNORMAL HIGH (ref 0–40)

## 2021-04-11 LAB — ECHOCARDIOGRAM COMPLETE
Area-P 1/2: 2.97 cm2
Height: 62 in
S' Lateral: 2.4 cm
Weight: 3584 oz

## 2021-04-11 LAB — HIV ANTIBODY (ROUTINE TESTING W REFLEX): HIV Screen 4th Generation wRfx: NONREACTIVE

## 2021-04-11 LAB — CBG MONITORING, ED: Glucose-Capillary: 137 mg/dL — ABNORMAL HIGH (ref 70–99)

## 2021-04-11 MED ORDER — CLOPIDOGREL BISULFATE 75 MG PO TABS
75.0000 mg | ORAL_TABLET | Freq: Every day | ORAL | Status: DC
  Administered 2021-04-11: 75 mg via ORAL
  Filled 2021-04-11: qty 1

## 2021-04-11 MED ORDER — ASPIRIN 81 MG PO TBEC: 81.0000 mg | DELAYED_RELEASE_TABLET | Freq: Every day | ORAL | 11 refills | Status: DC

## 2021-04-11 MED ORDER — CLOPIDOGREL BISULFATE 75 MG PO TABS: 75.0000 mg | ORAL_TABLET | Freq: Every day | ORAL | 1 refills | Status: AC

## 2021-04-11 MED ORDER — SIMVASTATIN 40 MG PO TABS: 40.0000 mg | ORAL_TABLET | Freq: Every evening | ORAL | 3 refills | Status: DC

## 2021-04-11 NOTE — Progress Notes (Signed)
  Echocardiogram 2D Echocardiogram has been performed.  Shelby Morgan 04/11/2021, 8:20 AM

## 2021-04-11 NOTE — Progress Notes (Signed)
SLP Cancellation Note  Patient Details Name: Shelby Morgan MRN: [id number] DOB: November 02, 1955   Cancelled treatment:       Reason Eval/Treat Not Completed: SLP screened, no needs identified, will sign off   Elvina Sidle, M.S., CCC-SLP 04/11/2021, 12:58 PM

## 2021-04-11 NOTE — Discharge Summary (Addendum)
Physician Discharge Summary [facility]    Patient: Shelby Morgan                   Admit date: 04/10/2021   DOB: January 22, 1956             Discharge date:04/11/2021/12:29 PM [id number]                          PCP: Cory Munch, PA-C  Disposition: HOME  Recommendations for Outpatient Follow-up:   Follow up: Neurologist next week- Dr. Phillips Odor  Continue current recommended medication aspirin 81 mg daily, Plavix 75 mg daily Hold your metformin for another 48 hrs (as patient received contrast for CT) Hold blood pressure medication next 2 to 3 days  Discharge Condition: Stable   Code Status:   Code Status: Full Code  Diet recommendation: Diabetic diet   Discharge Diagnoses:    Active Problems:   CVA (cerebral vascular accident) [facility])   History of Present Illness/ Hospital Course Shelby Morgan Summary:   Shelby Morgan is a 65 y.o. female with medical history significant of HTN, IIDM, HLD, chronic back pain, anxiety/depression, presented with new onset of right-sided numbness.   Patient started to feel right leg numbness last night around 930.  Treatment went to sleep, but woke up with persistent right leg numbness, and new onset of right arm numbness and right-sided face numbness. "Feels stiff on her right finger and wrist" but denied any weakness of any of the limbs.  No headache no blurry vision. She also complains about occasional "skipping beats in my heart", but no history of A. fib before.  At baseline, patient has mild " pins-and-needles feelings on the right toes".  She has severe back pain failed injection treatment, has been on narcotics, she was on aspirin before but decided to stop it for the wondering about interaction aspirin with her pain meds.   ED Course: Blood pressure elevated, CT head without contrast no acute findings.  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   Neurology made aware.  CVA -With right sided paresthesia -Telemetry neurology reviewed CT angiogram report, impression is the left P2 and P3 lesions were too small in caliber for intervention.   Not a candidate for tPA given low stroke score. As there is no CT signs of bleeding,  -Patient was started on aspirin, added Plavix, continue statins, -Continue statin LDL at goal 52, TSH normal 2.4, -MRI: Reviewed small left thalamic lesion -Echocardiogram reviewed within normal limits -Since admission patient has been monitored, negative for any dysrhythmias -Blood pressure on hold, permissive hypertension was allowed -Status post evaluation by PT/OT/speech, no deficit, no limitation,   -Telemetry neurology was consulted, also on-call neurologist Dr. Phillips Odor was consulted  IIDM -Hold metformin for 72 hours -Continue Amaryl  -Diabetic diet   HTN -Hold home BP meds, patient is to resume her medication on Sunday   Palpitations - normal sinus rhythm throughout hospital course, echo reviewed -TSH within normal limits 2.4   Chronic back pain -Outpatient follow-up with pain management.    Consults: Neurology on-call neurology was consulted, patient did not want a wait for reevaluation by cardiology later this afternoon wanted to be discharged home  code Status: Full Code Family Communication: None at bedside Disposition Plan    Discharge Instructions:   Discharge Instructions     Activity as  tolerated - No restrictions   Complete by: As directed    Call MD for:  difficulty breathing, headache or visual disturbances   Complete by: As directed    Call MD for:  persistant dizziness or light-headedness   Complete by: As directed    Diet Carb Modified   Complete by: As directed    Discharge instructions   Complete by: As directed    Follow-up with neurologist within next 2 to 3 days Continue current recommended medication aspirin, Plavix, statin Will hold your Glucophage  till Sunday May resume your blood pressure medication on Monday, 04/14/2021   Increase activity slowly   Complete by: As directed         Medication List     STOP taking these medications    meloxicam 15 MG tablet Commonly known as: MOBIC   naproxen sodium 220 MG tablet Commonly known as: ALEVE       TAKE these medications    acetaminophen 325 MG tablet Commonly known as: TYLENOL Take 650 mg by mouth every 6 (six) hours as needed for moderate pain or mild pain.   aspirin 81 MG EC tablet Take 1 tablet (81 mg total) by mouth daily. Swallow whole. Start taking on: April 12, 2021   busPIRone 5 MG tablet Commonly known as: BUSPAR Take 5 mg by mouth 2 (two) times daily.   clopidogrel 75 MG tablet Commonly known as: PLAVIX Take 1 tablet (75 mg total) by mouth daily. Start taking on: April 12, 2021   cyanocobalamin 100 MCG tablet Take 100 mcg by mouth daily.   Fish Oil 1000 MG Caps Take 1 capsule by mouth daily at 6 (six) AM.   furosemide 20 MG tablet Commonly known as: LASIX Take 20 mg by mouth every morning.   lisinopril 10 MG tablet Commonly known as: ZESTRIL Take 10 mg by mouth every morning.   metFORMIN 500 MG 24 hr tablet Commonly known as: GLUCOPHAGE-XR Take 1,000 mg by mouth 2 (two) times daily.   Multi-betic Diabetes Tabs Take 2 tablets by mouth daily.   pantoprazole 40 MG tablet Commonly known as: PROTONIX TAKE ONE TABLET BY MOUTH ONCE DAILY What changed: when to take this   simvastatin 40 MG tablet Commonly known as: ZOCOR Take 1 tablet (40 mg total) by mouth every evening.   sitaGLIPtin 100 MG tablet Commonly known as: JANUVIA Take 100 mg by mouth every morning.   traMADol 50 MG tablet Commonly known as: ULTRAM Take 50 mg by mouth every 4 (four) hours as needed.   traZODone 100 MG tablet Commonly known as: DESYREL Take 100 mg by mouth at bedtime as needed for sleep.        Allergies  Allergen Reactions   Codeine  Nausea And Vomiting     Procedures /Studies:   CT Angio Head W or Wo Contrast  Result Date: 04/10/2021 CLINICAL DATA:  Acute neuro deficit. Right-sided decreased sensation. EXAM: CT ANGIOGRAPHY HEAD AND NECK TECHNIQUE: Multidetector CT imaging of the head and neck was performed using the standard protocol during bolus administration of intravenous contrast. Multiplanar CT image reconstructions and MIPs were obtained to evaluate the vascular anatomy. Carotid stenosis measurements (when applicable) are obtained utilizing NASCET criteria, using the distal internal carotid diameter as the denominator. CONTRAST:  55m OMNIPAQUE IOHEXOL 350 MG/ML SOLN COMPARISON:  MRI head 01/08/2011 FINDINGS: CT HEAD FINDINGS Brain: No evidence of acute infarction, hemorrhage, hydrocephalus, extra-axial collection or mass lesion/mass effect. Vascular: Negative for hyperdense vessel Skull: Negative  Sinuses: Paranasal sinuses clear. Orbits: Bilateral cataract extraction Review of the MIP images confirms the above findings CTA NECK FINDINGS Aortic arch: Standard branching. Imaged portion shows no evidence of aneurysm or dissection. No significant stenosis of the major arch vessel origins. Mild atherosclerotic calcification aortic arch. Right carotid system: No significant right carotid stenosis. Mild atherosclerotic calcification proximal right internal carotid artery Left carotid system: No significant left carotid stenosis. Mild atherosclerotic disease left carotid bifurcation and internal carotid artery. Vertebral arteries: Both vertebral arteries widely patent. No significant stenosis. 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 Disc degeneration and spurring in the cervical spine most prominent C5-6. Multilevel facet degeneration. No acute skeletal abnormality. Other neck: No acute soft tissue abnormality. Upper chest: Lung apices clear bilaterally. Review of the MIP images confirms the above findings CTA HEAD FINDINGS Anterior  circulation: Atherosclerotic calcification in the cavernous carotid bilaterally without significant stenosis. Anterior and middle cerebral arteries patent without significant stenosis. Posterior circulation: Both vertebral arteries patent to the basilar. PICA patent bilaterally. Basilar widely patent. Occlusion proximal right posterior cerebral artery of indeterminate age. Moderate stenosis left P2 branch. Occluded left P3 branch . Venous sinuses: Normal venous enhancement Anatomic variants: None Review of the MIP images confirms the above findings IMPRESSION: 1. CT head negative for acute abnormality 2. Occlusion proximal right posterior cerebral artery of indeterminate age. Correlate with symptoms. 3. Moderate stenosis left P2 segment. Occluded left P3 branch. Correlate with symptoms and MRI. 4. These results were called by telephone at the time of interpretation on 04/10/2021 at 1:04 pm to provider [facility], who verbally acknowledged these results. Electronically Signed   By: Franchot Gallo M.D.   On: 04/10/2021 13:05   CT Angio Neck W and/or Wo Contrast  Result Date: 04/10/2021 CLINICAL DATA:  Acute neuro deficit. Right-sided decreased sensation. EXAM: CT ANGIOGRAPHY HEAD AND NECK TECHNIQUE: Multidetector CT imaging of the head and neck was performed using the standard protocol during bolus administration of intravenous contrast. Multiplanar CT image reconstructions and MIPs were obtained to evaluate the vascular anatomy. Carotid stenosis measurements (when applicable) are obtained utilizing NASCET criteria, using the distal internal carotid diameter as the denominator. CONTRAST:  54m OMNIPAQUE IOHEXOL 350 MG/ML SOLN COMPARISON:  MRI head 01/08/2011 FINDINGS: CT HEAD FINDINGS Brain: No evidence of acute infarction, hemorrhage, hydrocephalus, extra-axial collection or mass lesion/mass effect. Vascular: Negative for hyperdense vessel Skull: Negative Sinuses: Paranasal sinuses clear. Orbits: Bilateral cataract  extraction Review of the MIP images confirms the above findings CTA NECK FINDINGS Aortic arch: Standard branching. Imaged portion shows no evidence of aneurysm or dissection. No significant stenosis of the major arch vessel origins. Mild atherosclerotic calcification aortic arch. Right carotid system: No significant right carotid stenosis. Mild atherosclerotic calcification proximal right internal carotid artery Left carotid system: No significant left carotid stenosis. Mild atherosclerotic disease left carotid bifurcation and internal carotid artery. Vertebral arteries: Both vertebral arteries widely patent. No significant stenosis. 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 Disc degeneration and spurring in the cervical spine most prominent C5-6. Multilevel facet degeneration. No acute skeletal abnormality. Other neck: No acute soft tissue abnormality. Upper chest: Lung apices clear bilaterally. Review of the MIP images confirms the above findings CTA HEAD FINDINGS Anterior circulation: Atherosclerotic calcification in the cavernous carotid bilaterally without significant stenosis. Anterior and middle cerebral arteries patent without significant stenosis. Posterior circulation: Both vertebral arteries patent to the basilar. PICA patent bilaterally. Basilar widely patent. Occlusion proximal right posterior cerebral artery of indeterminate age. Moderate stenosis left P2 branch. Occluded left P3 branch . Venous  sinuses: Normal venous enhancement Anatomic variants: None Review of the MIP images confirms the above findings IMPRESSION: 1. CT head negative for acute abnormality 2. Occlusion proximal right posterior cerebral artery of indeterminate age. Correlate with symptoms. 3. Moderate stenosis left P2 segment. Occluded left P3 branch. Correlate with symptoms and MRI. 4. These results were called by telephone at the time of interpretation on 04/10/2021 at 1:04 pm to provider [facility], who verbally acknowledged these results.  Electronically Signed   By: Franchot Gallo M.D.   On: 04/10/2021 13:05   MR BRAIN WO CONTRAST  Result Date: 04/10/2021 CLINICAL DATA:  New onset of right-sided numbness. EXAM: MRI HEAD WITHOUT CONTRAST TECHNIQUE: Multiplanar, multiecho pulse sequences of the brain and surrounding structures were obtained without intravenous contrast. COMPARISON:  CT studies same day. FINDINGS: Brain: Diffusion imaging shows acute small vessel infarction affecting the lateral left thalamus. No other acute insult. The brainstem and cerebellum are otherwise normal. Cerebral hemispheres show mild chronic small-vessel ischemic changes of the white matter. No cortical or large vessel territory infarction. No mass lesion, hemorrhage, hydrocephalus or extra-axial collection. Vascular: Major vessels at the base of the brain show flow. Skull and upper cervical spine: Negative Sinuses/Orbits: Clear/normal Other: None IMPRESSION: Acute small vessel infarction affecting the lateral left thalamus. Mild chronic small-vessel ischemic changes otherwise affecting the cerebral hemispheric white matter. Electronically Signed   By: Nelson Chimes M.D.   On: 04/10/2021 16:44   ECHOCARDIOGRAM COMPLETE  Result Date: 04/11/2021    ECHOCARDIOGRAM REPORT   Patient Name:   Shelby Morgan Date of Exam: 04/11/2021 Medical Rec #:  [id number]      Height:       62.0 in Accession #:    [id number]     Weight:       224.0 lb Date of Birth:  Feb 24, 1956      BSA:          2.006 m Patient Age:    73 years       BP:           152/104 mmHg Patient Gender: F              HR:           72 bpm. Exam Location:  [facility] Procedure: 2D Echo, Cardiac Doppler and Color Doppler Indications:    CVA  History:        Patient has no prior history of Echocardiogram examinations.                 Signs/Symptoms:Numbness; Risk Factors:Hypertension, Diabetes,                 Dyslipidemia and Obesity.  Sonographer:    Dustin Flock RDCS Referring Phys: [id number] Lequita Halt   Sonographer Comments: Technically difficult study due to poor echo windows and patient is morbidly obese. IMPRESSIONS  1. Left ventricular ejection fraction, by estimation, is 60 to 65%. The left ventricle has normal function. The left ventricle has no regional wall motion abnormalities. There is mild left ventricular hypertrophy. Left ventricular diastolic parameters are consistent with Grade I diastolic dysfunction (impaired relaxation).  2. Right ventricular systolic function is normal. The right ventricular size is normal. There is normal pulmonary artery systolic pressure. The estimated right ventricular systolic pressure is [id number] mmHg.  3. The mitral valve is grossly normal. Mild mitral valve regurgitation.  4. The aortic valve is tricuspid. Aortic valve regurgitation is not visualized.  5. The inferior vena cava  is normal in size with greater than 50% respiratory variability, suggesting right atrial pressure of 3 mmHg. Comparison(s): No prior Echocardiogram. FINDINGS  Left Ventricle: Left ventricular ejection fraction, by estimation, is 60 to 65%. The left ventricle has normal function. The left ventricle has no regional wall motion abnormalities. The left ventricular internal cavity size was normal in size. There is  mild left ventricular hypertrophy. Left ventricular diastolic parameters are consistent with Grade I diastolic dysfunction (impaired relaxation). Right Ventricle: The right ventricular size is normal. No increase in right ventricular wall thickness. Right ventricular systolic function is normal. There is normal pulmonary artery systolic pressure. The tricuspid regurgitant velocity is 2.11 m/s, and  with an assumed right atrial pressure of 3 mmHg, the estimated right ventricular systolic pressure is [id number] mmHg. Left Atrium: Left atrial size was normal in size. Right Atrium: Right atrial size was normal in size. Pericardium: There is no evidence of pericardial effusion. Mitral Valve: The mitral  valve is grossly normal. Mild mitral annular calcification. Mild mitral valve regurgitation. Tricuspid Valve: The tricuspid valve is grossly normal. Tricuspid valve regurgitation is trivial. Aortic Valve: The aortic valve is tricuspid. There is mild aortic valve annular calcification. Aortic valve regurgitation is not visualized. Pulmonic Valve: The pulmonic valve was grossly normal. Pulmonic valve regurgitation is trivial. Aorta: The aortic root is normal in size and structure. Venous: The inferior vena cava is normal in size with greater than 50% respiratory variability, suggesting right atrial pressure of 3 mmHg. IAS/Shunts: No atrial level shunt detected by color flow Doppler.  LEFT VENTRICLE PLAX 2D LVIDd:         4.30 cm  Diastology LVIDs:         2.40 cm  LV e' medial:    5.98 cm/s LV PW:         1.10 cm  LV E/e' medial:  12.7 LV IVS:        1.10 cm  LV e' lateral:   8.16 cm/s LVOT diam:     2.30 cm  LV E/e' lateral: 9.3 LV SV:         68 LV SV Index:   34 LVOT Area:     4.15 cm  RIGHT VENTRICLE RV Basal diam:  2.70 cm RV S prime:     10.30 cm/s TAPSE (M-mode): 3.0 cm LEFT ATRIUM             Index       RIGHT ATRIUM          Index LA diam:        3.20 cm 1.60 cm/m  RA Area:     8.69 cm LA Vol (A2C):   38.0 ml 18.94 ml/m RA Volume:   14.80 ml 7.38 ml/m LA Vol (A4C):   37.4 ml 18.64 ml/m LA Biplane Vol: 37.8 ml 18.84 ml/m  AORTIC VALVE LVOT Vmax:   84.90 cm/s LVOT Vmean:  52.500 cm/s LVOT VTI:    0.164 m  AORTA Ao Root diam: 3.30 cm MITRAL VALVE               TRICUSPID VALVE MV Area (PHT): 2.97 cm    TR Peak grad:   17.8 mmHg MV Decel Time: 255 msec    TR Vmax:        211.00 cm/s MV E velocity: 75.80 cm/s MV A velocity: 97.70 cm/s  SHUNTS MV E/A ratio:  0.78        Systemic VTI:  0.16 m  Systemic Diam: 2.30 cm Rozann Lesches MD Electronically signed by Rozann Lesches MD Signature Date/Time: 04/11/2021/12:25:17 PM    Final     Subjective:   Patient was seen and examined  04/11/2021, 12:29 PM Patient stable today. No acute distress.  No issues overnight Stable for discharge.  Discharge Exam:    Vitals:   04/11/21 0000 04/11/21 0345 04/11/21 0725 04/11/21 1035  BP: 132/83 (!) 141/79 (!) 152/104 (!) 177/86  Pulse: 79 74 72 77  Resp: '16 15 16 18  '$ Temp:      TempSrc:      SpO2: 91% 91% 96% 95%  Weight:      Height:        General: Pt lying comfortably in bed & appears in no obvious distress. Cardiovascular: S1 & S2 heard, RRR, S1/S2 +. No murmurs, rubs, gallops or clicks. No JVD or pedal edema. Respiratory: Clear to auscultation without wheezing, rhonchi or crackles. No increased work of breathing. Abdominal:  Non-distended, non-tender & soft. No organomegaly or masses appreciated. Normal bowel sounds heard. CNS: Alert and oriented. No focal deficits. Extremities: no edema, no cyanosis      The results of significant diagnostics from this hospitalization (including imaging, microbiology, ancillary and laboratory) are listed below for reference.      Microbiology:   Recent Results (from the past 240 hour(s))  Resp Panel by RT-PCR (Flu A&B, Covid) Nasopharyngeal Swab     Status: None   Collection Time: 04/10/21 11:37 AM   Specimen: Nasopharyngeal Swab; Nasopharyngeal(NP) swabs in vial transport medium  Result Value Ref Range Status   SARS Coronavirus 2 by RT PCR NEGATIVE NEGATIVE Final    Comment: (NOTE) SARS-CoV-2 target nucleic acids are NOT DETECTED.  The SARS-CoV-2 RNA is generally detectable in upper respiratory specimens during the acute phase of infection. The lowest concentration of SARS-CoV-2 viral copies this assay can detect is 138 copies/mL. A negative result does not preclude SARS-Cov-2 infection and should not be used as the sole basis for treatment or other patient management decisions. A negative result may occur with  improper specimen collection/handling, submission of specimen other than nasopharyngeal swab, presence of  viral mutation(s) within the areas targeted by this assay, and inadequate number of viral copies(<138 copies/mL). A negative result must be combined with clinical observations, patient history, and epidemiological information. The expected result is Negative.  Fact Sheet for Patients:  EntrepreneurPulse.com.au  Fact Sheet for Healthcare Providers:  IncredibleEmployment.be  This test is no t yet approved or cleared by the [place] FDA and  has been authorized for detection and/or diagnosis of SARS-CoV-2 by FDA under an Emergency Use Authorization (EUA). This EUA will remain  in effect (meaning this test can be used) for the duration of the COVID-19 declaration under Section 564(b)(1) of the Act, 21 U.S.C.section 360bbb-3(b)(1), unless the authorization is terminated  or revoked sooner.       Influenza A by PCR NEGATIVE NEGATIVE Final   Influenza B by PCR NEGATIVE NEGATIVE Final    Comment: (NOTE) The Xpert Xpress SARS-CoV-2/FLU/RSV plus assay is intended as an aid in the diagnosis of influenza from Nasopharyngeal swab specimens and should not be used as a sole basis for treatment. Nasal washings and aspirates are unacceptable for Xpert Xpress SARS-CoV-2/FLU/RSV testing.  Fact Sheet for Patients: EntrepreneurPulse.com.au  Fact Sheet for Healthcare Providers: IncredibleEmployment.be  This test is not yet approved or cleared by the [place] FDA and has been authorized for detection and/or diagnosis of SARS-CoV-2 by FDA  under an Emergency Use Authorization (EUA). This EUA will remain in effect (meaning this test can be used) for the duration of the COVID-19 declaration under Section 564(b)(1) of the Act, 21 U.S.C. section 360bbb-3(b)(1), unless the authorization is terminated or revoked.  Performed at [facility], [address], [place], [place] [address]      Labs:   CBC: Recent Labs  Lab  04/10/21 1013  WBC 8.0  NEUTROABS 5.3  HGB 12.5  HCT 39.1  MCV 88.7  PLT 0000000   Basic Metabolic Panel: Recent Labs  Lab 04/10/21 1013  NA 137  K 4.0  CL 100  CO2 29  GLUCOSE 147*  BUN 19  CREATININE 0.81  CALCIUM 9.4   Liver Function Tests: Recent Labs  Lab 04/10/21 1013  AST 28  ALT 31  ALKPHOS 63  BILITOT 0.4  PROT 7.4  ALBUMIN 3.8   BNP (last 3 results) No results for input(s): BNP in the last 8760 hours. Cardiac Enzymes: No results for input(s): CKTOTAL, CKMB, CKMBINDEX, TROPONINI in the last 168 hours. CBG: Recent Labs  Lab 04/10/21 1707 04/11/21 0725  GLUCAP 93 137*   Hgb A1c Recent Labs    04/10/21 1013 04/11/21 0324  HGBA1C 6.4* 6.4*   Lipid Profile Recent Labs    04/11/21 0324  CHOL 143  HDL 42  LDLCALC 52  TRIG 244*  CHOLHDL 3.4   Thyroid function studies Recent Labs    04/10/21 1014  TSH 2.453   Anemia work up No results for input(s): VITAMINB12, FOLATE, FERRITIN, TIBC, IRON, RETICCTPCT in the last 72 hours. Urinalysis    Component Value Date/Time   COLORURINE STRAW (A) 04/10/2021 1301   APPEARANCEUR CLEAR 04/10/2021 1301   LABSPEC 1.018 04/10/2021 1301   PHURINE 6.0 04/10/2021 1301   GLUCOSEU NEGATIVE 04/10/2021 1301   HGBUR NEGATIVE 04/10/2021 1301   BILIRUBINUR NEGATIVE 04/10/2021 1301   KETONESUR NEGATIVE 04/10/2021 1301   PROTEINUR NEGATIVE 04/10/2021 1301   UROBILINOGEN 0.2 01/08/2011 1649   NITRITE NEGATIVE 04/10/2021 1301   LEUKOCYTESUR NEGATIVE 04/10/2021 1301         Time coordinating discharge: Over 45 minutes  SIGNED: Deatra James, MD, FACP, FHM. [facility],  Please use [email or web address] to Page If 7PM-7AM, please contact night-coverage [email or web address] Ota [facility] 04/11/2021, 12:29 PM

## 2021-04-11 NOTE — ED Notes (Signed)
PT working with patient.

## 2021-04-11 NOTE — Evaluation (Signed)
Physical Therapy Evaluation Patient Details Name: Shelby Morgan MRN: [id number] DOB: 02/23/1956 Today's Date: 04/11/2021  History of Present Illness  Shelby Morgan is a 65 y.o. female with medical history significant of HTN, IIDM, HLD, chronic back pain, anxiety/depression, presented with new onset of right-sided numbness.     Patient started to feel right leg numbness last night around 930.  Treatment went to sleep, but woke up with persistent right leg numbness, and new onset of right arm numbness and right-sided face numbness. "Feels stiff on her right finger and wrist" but denied any weakness of any of the limbs.  No headache no blurry vision. She also complains about occasional "skipping beats in my heart", but no history of A. fib before.  At baseline, patient has mild " pins-and-needles feelings on the right toes".  She has severe back pain failed injection treatment, has been on narcotics, she was on aspirin before but decided to stop it for the wondering about interaction aspirin with her pain meds.   Clinical Impression  Patient functioning at baseline for functional mobility and gait other than c/o right sided numbness with mildly decreased right foot proprioception when taking steps, no loss of balance and labored gait is baseline per patient due to chronic low back pain.  Patient states she presently seeing a chiropractor for her low back pain.  Plan:  Patient discharged from physical therapy to care of nursing for ambulation daily as tolerated for length of stay.         Recommendations for follow up therapy are one component of a multi-disciplinary discharge planning process, led by the attending physician.  Recommendations may be updated based on patient status, additional functional criteria and insurance authorization.  Follow Up Recommendations Supervision - Intermittent;No PT follow up    Equipment Recommendations       Recommendations for Other Services       Precautions  / Restrictions Precautions Precautions: None      Mobility  Bed Mobility Overal bed mobility: Modified Independent                  Transfers Overall transfer level: Independent Equipment used: None             General transfer comment: Slightly labored ambulation but indepdent transfers.  Ambulation/Gait Ambulation/Gait assistance: Modified independent (Device/Increase time) Gait Distance (Feet): 150 Feet Assistive device: None Gait Pattern/deviations: Decreased step length - right;Decreased stride length Gait velocity: decreased   General Gait Details: slightly labored cadence with increased RLE external rotation, mild decreased right ankle dorsiflexion, no loss of balance, baseline per patient due to chronic low back pain  Stairs            Wheelchair Mobility    Modified Rankin (Stroke Patients Only)       Balance Overall balance assessment: Mild deficits observed, not formally tested                                           Pertinent Vitals/Pain Pain Assessment: 0-10 Pain Score: 7  Pain Location: back Pain Descriptors / Indicators: Aching Pain Intervention(s): Limited activity within patient's tolerance;Monitored during session;Repositioned    Home Living Family/patient expects to be discharged to:: Private residence Living Arrangements: Alone Available Help at Discharge: Other (Comment) (none reported) Type of Home: House Home Access: Stairs to enter Entrance Stairs-Rails: Can reach both;Right;Left Entrance Stairs-Number  of Steps: 5 Home Layout: One level Home Equipment: Cane - single point      Prior Function Level of Independence: Independent with assistive device(s) (Pt reports intermittent cane use for inital sit to stand for transfers. Indepndent ADL's and IADL's)               Hand Dominance   Dominant Hand: Right    Extremity/Trunk Assessment   Upper Extremity Assessment Upper Extremity  Assessment: Overall WFL for tasks assessed;Defer to OT evaluation [facility] for tasks assessed with the exception of decreased light touch sensation on R UE.)    Lower Extremity Assessment Lower Extremity Assessment: Overall WFL for tasks assessed    Cervical / Trunk Assessment Cervical / Trunk Assessment: Normal  Communication   Communication: No difficulties  Cognition Arousal/Alertness: Awake/alert Behavior During Therapy: WFL for tasks assessed/performed Overall Cognitive Status: Within Functional Limits for tasks assessed                                        General Comments      Exercises     Assessment/Plan    PT Assessment Patent does not need any further PT services  PT Problem List         PT Treatment Interventions      PT Goals (Current goals can be found in the Care Plan section)  Acute Rehab PT Goals Patient Stated Goal: return home PT Goal Formulation: With patient Time For Goal Achievement: 04/11/21 Potential to Achieve Goals: Good    Frequency     Barriers to discharge        Co-evaluation PT/OT/SLP Co-Evaluation/Treatment: Yes   PT goals addressed during session: Mobility/safety with mobility;Balance OT goals addressed during session: Strengthening/ROM;ADL's and self-care       AM-PAC PT "6 Clicks" Mobility  Outcome Measure Help needed turning from your back to your side while in a flat bed without using bedrails?: None Help needed moving from lying on your back to sitting on the side of a flat bed without using bedrails?: None Help needed moving to and from a bed to a chair (including a wheelchair)?: None Help needed standing up from a chair using your arms (e.g., wheelchair or bedside chair)?: None Help needed to walk in hospital room?: None Help needed climbing 3-5 steps with a railing? : None 6 Click Score: 24    End of Session   Activity Tolerance: Patient tolerated treatment well Patient left: in bed;with call  bell/phone within reach Nurse Communication: Mobility status PT Visit Diagnosis: Unsteadiness on feet (R26.81);Other abnormalities of gait and mobility (R26.89);Muscle weakness (generalized) (M62.81)    Time: [id number] PT Time Calculation (min) (ACUTE ONLY): 20 min   Charges:   PT Evaluation $PT Eval Moderate Complexity: 1 Mod PT Treatments $Therapeutic Activity: 8-22 mins        10:03 AM, 04/11/21 Lonell Grandchild, MPT Physical Therapist with [facility] [phone] office [phone] mobile phone

## 2021-04-11 NOTE — Evaluation (Signed)
Occupational Therapy Evaluation Patient Details Name: Shelby Morgan MRN: [id number] DOB: 04/28/1956 Today's Date: 04/11/2021   History of Present Illness Shelby Morgan is a 65 y.o. female with medical history significant of HTN, IIDM, HLD, chronic back pain, anxiety/depression, presented with new onset of right-sided numbness.     Patient started to feel right leg numbness last night around 930.  Treatment went to sleep, but woke up with persistent right leg numbness, and new onset of right arm numbness and right-sided face numbness. "Feels stiff on her right finger and wrist" but denied any weakness of any of the limbs.  No headache no blurry vision. She also complains about occasional "skipping beats in my heart", but no history of A. fib before.  At baseline, patient has mild " pins-and-needles feelings on the right toes".  She has severe back pain failed injection treatment, has been on narcotics, she was on aspirin before but decided to stop it for the wondering about interaction aspirin with her pain meds.   Clinical Impression   Pt appears to be functioning at or near baseline levels with the exception of numbness and tingling in R UE and R LE. Pt demonstrates vision WFL and B UE strength WFL. Pt was able to ambulate in hall and room without assist. Pt also used the bathroom independently. Pt is not recommended for further acute OT services and will be discharged to care of nursing staff for the remaining length of stay.      Recommendations for follow up therapy are one component of a multi-disciplinary discharge planning process, led by the attending physician.  Recommendations may be updated based on patient status, additional functional criteria and insurance authorization.   Follow Up Recommendations  No OT follow up    Equipment Recommendations  None recommended by OT           Precautions / Restrictions Precautions Precautions: None Restrictions Weight Bearing Restrictions:  No      Mobility Bed Mobility                    Transfers Overall transfer level: Independent Equipment used: None             General transfer comment: Slightly labored ambulation but indepdent transfers.    Balance Overall balance assessment: Mild deficits observed, not formally tested                                         ADL either performed or assessed with clinical judgement   ADL Overall ADL's : Independent                                             Vision Baseline Vision/History: 1 Wears glasses Ability to See in Adequate Light: 0 Adequate Patient Visual Report: No change from baseline Vision Assessment?: No apparent visual deficits     Perception     Praxis      Pertinent Vitals/Pain Pain Assessment: 0-10 Pain Score: 7  Pain Location: back Pain Descriptors / Indicators: Aching Pain Intervention(s): Limited activity within patient's tolerance;Monitored during session;Repositioned     Hand Dominance Right   Extremity/Trunk Assessment Upper Extremity Assessment Upper Extremity Assessment: Overall WFL for tasks assessed [facility] for tasks assessed with the exception of  decreased light touch sensation on R UE.)   Lower Extremity Assessment Lower Extremity Assessment: Defer to PT evaluation   Cervical / Trunk Assessment Cervical / Trunk Assessment: Normal   Communication Communication Communication: No difficulties   Cognition Arousal/Alertness: Awake/alert Behavior During Therapy: WFL for tasks assessed/performed Overall Cognitive Status: Within Functional Limits for tasks assessed                                     General Comments       Exercises     Shoulder Instructions      Home Living Family/patient expects to be discharged to:: Private residence Living Arrangements: Alone Available Help at Discharge: Other (Comment) (none reported) Type of Home: House Home Access:  Stairs to enter [employer] of Steps: 5 Entrance Stairs-Rails: Can reach both;Right;Left Home Layout: One level     Bathroom Shower/Tub: Teacher, early years/pre: Standard     Home Equipment: Cane - single point          Prior Functioning/Environment Level of Independence: Independent with assistive device(s) (Pt reports intermittent cane use for inital sit to stand for transfers. Indepndent ADL's and IADL's)                 OT Problem List:        OT Treatment/Interventions:      OT Goals(Current goals can be found in the care plan section) Acute Rehab OT Goals Patient Stated Goal: return home  OT Frequency:     Barriers to D/C:            Co-evaluation PT/OT/SLP Co-Evaluation/Treatment: Yes Reason for Co-Treatment: To address functional/ADL transfers   OT goals addressed during session: Strengthening/ROM;ADL's and self-care      AM-PAC OT "6 Clicks" Daily Activity     Outcome Measure Help from another person eating meals?: None Help from another person taking care of personal grooming?: None Help from another person toileting, which includes using toliet, bedpan, or urinal?: None Help from another person bathing (including washing, rinsing, drying)?: None Help from another person to put on and taking off regular upper body clothing?: None Help from another person to put on and taking off regular lower body clothing?: None 6 Click Score: 24   End of Session    Activity Tolerance: Patient tolerated treatment well Patient left: in bed;with call bell/phone within reach  OT Visit Diagnosis: Unsteadiness on feet (R26.81);Other symptoms and signs involving the nervous system [id number])                [id number] OT Time Calculation (min): 12 min Charges:  OT General Charges $OT Visit: 1 Visit OT Evaluation $OT Eval Low Complexity: 1 Low  Alicen Donalson OT, MOT  Larey Seat 04/11/2021, 9:14 AM

## 2021-04-11 NOTE — ED Notes (Signed)
Patient to desk and states that she is ready to go home if possible. States that she was being admitted for echo which has been done and she does not want to wait any more. Hospitalist notified.

## 2021-04-11 NOTE — ED Notes (Signed)
Speech therapy to see patient at this time.

## 2021-05-02 ENCOUNTER — Telehealth: Payer: Self-pay | Admitting: Physical Medicine and Rehabilitation

## 2021-05-02 DIAGNOSIS — M5416 Radiculopathy, lumbar region: Secondary | ICD-10-CM

## 2021-05-02 NOTE — Telephone Encounter (Signed)
Pt called and wants to schedule an appt with Dr.Newton. Same as before.

## 2021-05-02 NOTE — Addendum Note (Signed)
Addended by: Sherre Scarlet B on: 05/02/2021 11:34 AM   Modules accepted: Orders

## 2021-05-02 NOTE — Telephone Encounter (Signed)
Right L3-4 IL on 12/12/20. Ok to repeat if helped, same problem/side, and no new injury?

## 2021-05-02 NOTE — Telephone Encounter (Signed)
Patient reports good pain relief and pain is now back in the same area. Referral placed and patient aware we will call to schedule when injection has been authorized by insurance.

## 2021-05-19 ENCOUNTER — Telehealth: Payer: Self-pay | Admitting: Physical Medicine and Rehabilitation

## 2021-05-19 NOTE — Telephone Encounter (Signed)
Patient returned call asked for a call back   [phone]

## 2021-05-26 ENCOUNTER — Encounter: Payer: Self-pay | Admitting: Orthopedic Surgery

## 2021-05-26 ENCOUNTER — Other Ambulatory Visit: Payer: Self-pay

## 2021-05-26 ENCOUNTER — Ambulatory Visit (INDEPENDENT_AMBULATORY_CARE_PROVIDER_SITE_OTHER): Payer: No Typology Code available for payment source | Admitting: Orthopedic Surgery

## 2021-05-26 VITALS — BP 149/94 | HR 82 | Ht 62.0 in | Wt 222.0 lb

## 2021-05-26 DIAGNOSIS — M7061 Trochanteric bursitis, right hip: Secondary | ICD-10-CM

## 2021-05-26 NOTE — Progress Notes (Signed)
Chief Complaint  Patient presents with   New Patient (Initial Visit)   Back Pain    Mid and Low back pain/ Pt has had MRI    HPI: 65 year old female previously seen by Dr. Ernestina Patches who gave her various injections in her lower back referred to [place] now by the chiropractor complaining of continued back and hip pain with questionable bursitis  Shelby Morgan is having trouble lifting her right leg she had a stroke in September which affected her right side upper and lower.  She says it is hard to lift her leg up and she has trouble getting dressed but no groin pain    Past Medical History:  Diagnosis Date   Anxiety    Bell's palsy 01/08/2011   Chronic back pain    Depression    Diabetes mellitus    GERD (gastroesophageal reflux disease)    Hepatomegaly    Hypertension    Melanoma ([place]) 2009   Dr Nevada Crane, Stage 2, required only surgery   NAFLD (nonalcoholic fatty liver disease)    Skin cancer     BP (!) 149/94   Pulse 82   Ht 5\' 2"  (1.575 m)   Wt 222 lb (100.7 kg)   BMI 40.60 kg/m    General appearance: Well-developed well-nourished no gross deformities  Cardiovascular normal pulse and perfusion normal color without edema  Neurologically no sensation loss or deficits or pathologic reflexes  Psychological: Awake alert and oriented x3 mood and affect normal  Skin no lacerations or ulcerations no nodularity no palpable masses, no erythema or nodularity  Musculoskeletal: Right hip logroll negative for groin pain straight leg raise negative for groin pain tenderness directly over greater trochanter  Imaging MRI report  Disc desiccation throughout the lumbar spine. Moderate to severe disc space narrowing at L1-2, L2-3, and L5-S1 with moderate narrowing at L3-4 and mild-to-moderate narrowing at L4-5.   T12-L1: Negative.   L1-2: Right eccentric disc bulging, endplate spurring, and mild facet hypertrophy result in mild right lateral recess stenosis and mild right neural foraminal  stenosis without significant spinal stenosis.   L2-3: Prominent circumferential disc bulging eccentric to the right, endplate spurring, prominent dorsal epidural fat, and severe facet and ligamentum flavum hypertrophy result in severe spinal stenosis and severe right and mild-to-moderate left neural foraminal stenosis.   L3-4: Prominent circumferential disc bulging eccentric to the right, endplate spurring, and severe facet and ligamentum flavum hypertrophy result in mild spinal stenosis and moderate right neural foraminal stenosis.   L4-5: Disc bulging eccentric to the left and moderate facet and ligamentum flavum hypertrophy result in mild bilateral lateral recess stenosis and mild right and moderate left neural foraminal stenosis without spinal stenosis.   L5-S1: Anterolisthesis with bulging uncovered disc, endplate spurring, and spurring at the pars defects result in moderate right and severe left neural foraminal stenosis without spinal stenosis.   IMPRESSION: 1. 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 2. Severe spinal stenosis and severe right neural foraminal stenosis at L2-3. 3. Grade 2 anterolisthesis at L5-S1 due to L5 pars defects with moderate right and severe left neural foraminal stenosis. 4. Mild spinal stenosis at L3-4.     Electronically Signed   By: Logan Bores M.D.   On: 11/01/2020 16:55  A/P  Encounter Diagnosis  Name Primary?   Greater trochanteric bursitis of right hip Yes   I was able to review Dr. Romona Curls notes and also the MRI report as noted above I think she has some greater trochanteric  bursitis based on exam  Procedure note  Injection  Verbal consent was obtained to inject the right greater trochanter  Timeout procedure was completed to confirm injection site  Diagnosis greater trochanteric bursitis right hip  Medications used 40 mg Depo-Medrol Lidocaine 1% plain 3 cc  Anesthesia was provided by ethyl chloride  spray  Prep was performed with alcohol  Technique of injection 25-gauge needle, direct injection point of maximal tenderness  No complications were noted

## 2021-06-09 ENCOUNTER — Encounter: Payer: Self-pay | Admitting: Physical Medicine and Rehabilitation

## 2021-06-09 ENCOUNTER — Ambulatory Visit (INDEPENDENT_AMBULATORY_CARE_PROVIDER_SITE_OTHER): Payer: No Typology Code available for payment source | Admitting: Physical Medicine and Rehabilitation

## 2021-06-09 ENCOUNTER — Other Ambulatory Visit: Payer: Self-pay

## 2021-06-09 VITALS — BP 164/102 | HR 78

## 2021-06-09 DIAGNOSIS — M5416 Radiculopathy, lumbar region: Secondary | ICD-10-CM | POA: Diagnosis not present

## 2021-06-09 DIAGNOSIS — Q762 Congenital spondylolisthesis: Secondary | ICD-10-CM | POA: Diagnosis not present

## 2021-06-09 DIAGNOSIS — M48062 Spinal stenosis, lumbar region with neurogenic claudication: Secondary | ICD-10-CM | POA: Diagnosis not present

## 2021-06-09 DIAGNOSIS — M48061 Spinal stenosis, lumbar region without neurogenic claudication: Secondary | ICD-10-CM

## 2021-06-09 NOTE — Progress Notes (Signed)
Pt state lower back pain. Pt state walking, standing and sitting makes the pain worse. Pt state she take pain meds to help ease her pain. Pt has hx of inj on 12/12/20 pt state it helped.  Numeric Pain Rating Scale and Functional Assessment Average Pain 8   In the last MONTH (on 0-10 scale) has pain interfered with the following?  1. General activity like being  able to carry out your everyday physical activities such as walking, climbing stairs, carrying groceries, or moving a chair?  Rating(10)   +Driver, +BT pt didn't stop meds, -Dye Allergies.

## 2021-06-10 ENCOUNTER — Encounter: Payer: Self-pay | Admitting: Physical Medicine and Rehabilitation

## 2021-06-10 NOTE — Progress Notes (Signed)
Shelby Morgan - 65 y.o. female MRN [id number]  Date of birth: [birth year]  Office Visit Note: Visit Date: 06/09/2021 PCP: Cory Munch, PA-C Referred by: Cory Munch, PA-C  Subjective: Chief Complaint  Patient presents with   Lower Back - Pain   HPI: Shelby Morgan is a 65 y.o. female who comes in todayFor evaluation management of chronic worsening severe axial low back pain with some referrals into the hips laterally and legs.  We have completed prior epidural injection at L3-4 below area of severe stenosis at L2-3 and she actually did quite well with good relief of symptoms.  She reports no new injury just worsening pain now over the last several weeks.  She has not noted any focal weakness or dysesthesias.  Her case is complicated by history of stroke in September which was status post our last injection.  This was a small thalamic stroke.  She was seen in the emergency department but it does not appear to be that she was admitted.  She was placed on Plavix and tells me today that once the Plavix prescription runs out she is supposed to stop that but she is on that today and did take it this morning.  She reports not being notified of any change in that in terms of doing the injection.  Along talk today about anticoagulation and epidural steroid injections.  I think she is being followed from a neurological standpoint by Dr. Phillips Odor.    Review of Systems  Musculoskeletal:  Positive for back pain and joint pain.  All other systems reviewed and are negative. Otherwise per HPI.  Assessment & Plan: Visit Diagnoses:    ICD-10-CM   1. Spinal stenosis of lumbar region with neurogenic claudication  M48.062     2. Lumbar radiculopathy  M54.16     3. 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 Foraminal stenosis of lumbar region  M48.061        Plan: Findings:  Chronic severe and recalcitrant with intermittent flareups of low back pain and bilateral hip and leg pain at times.   Epidural injection was beneficial below the level of severe stenosis at L2-3.  Plan would be to repeat L3-4 interlaminar epidural steroid injection but she needs to be off her Plavix for 6 to 7 days.  We will go ahead and do that since she is going to be off the Plavix anyway fairly soon.  She is going to restart the Plavix after the injection however and then will finish that out and follow-up with neurology for her stroke prophylaxis and ongoing care with that.  In terms of her lumbar spine she does have severe stenosis.  Depending on relief with this would look at L2 transforaminal injection if needed.  Patient probably should consider lumbar decompression.   Meds & Orders: No orders of the defined types were placed in this encounter.  No orders of the defined types were placed in this encounter.   Follow-up: No follow-ups on file.   Procedures: No procedures performed      Clinical History: MRI LUMBAR SPINE WITHOUT CONTRAST   TECHNIQUE: Multiplanar, multisequence MR imaging of the lumbar spine was performed. No intravenous contrast was administered.   COMPARISON:  Lumbar spine radiographs 09/25/2020   FINDINGS: Segmentation:  5 lumbar vertebrae.  Hypoplastic ribs at T12.   Alignment: Mild lumbar levoscoliosis. Trace retrolisthesis of L2 on L3. Bilateral L5 pars defects with 8 mm anterolisthesis of L5 on  S1.   Vertebrae: No fracture or suspicious osseous lesion. Prominent degenerative endplate changes asymmetrically greater on the right at L1-2 and L2-3 including degenerative edema at L2-3.   Conus medullaris and cauda equina: Conus extends to the L1 level. Conus and cauda equina appear normal.   Paraspinal and other soft tissues: Unremarkable.   Disc levels:   Disc desiccation throughout the lumbar spine. Moderate to severe disc space narrowing at L1-2, L2-3, and L5-S1 with moderate narrowing at L3-4 and mild-to-moderate narrowing at L4-5.   T12-L1: Negative.   L1-2:  Right eccentric disc bulging, endplate spurring, and mild facet hypertrophy result in mild right lateral recess stenosis and mild right neural foraminal stenosis without significant spinal stenosis.   L2-3: Prominent circumferential disc bulging eccentric to the right, endplate spurring, prominent dorsal epidural fat, and severe facet and ligamentum flavum hypertrophy result in severe spinal stenosis and severe right and mild-to-moderate left neural foraminal stenosis.   L3-4: Prominent circumferential disc bulging eccentric to the right, endplate spurring, and severe facet and ligamentum flavum hypertrophy result in mild spinal stenosis and moderate right neural foraminal stenosis.   L4-5: Disc bulging eccentric to the left and moderate facet and ligamentum flavum hypertrophy result in mild bilateral lateral recess stenosis and mild right and moderate left neural foraminal stenosis without spinal stenosis.   L5-S1: Anterolisthesis with bulging uncovered disc, endplate spurring, and spurring at the pars defects result in moderate right and severe left neural foraminal stenosis without spinal stenosis.   IMPRESSION: 1. 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 2. Severe spinal stenosis and severe right neural foraminal stenosis at L2-3. 3. Grade 2 anterolisthesis at L5-S1 due to L5 pars defects with moderate right and severe left neural foraminal stenosis. 4. Mild spinal stenosis at L3-4.     Electronically Signed   By: Logan Bores M.D.   On: 11/01/2020 16:55   She reports that she quit smoking about 28 years ago. Her smoking use included cigarettes. She has a 5.50 pack-year smoking history. She has never used smokeless tobacco.  Recent Labs    04/10/21 1013 04/11/21 0324  HGBA1C 6.4* 6.4*    Objective:  VS:  HT:    WT:   BMI:     BP: (!) 164/102  HR:78bpm  TEMP: ( )  RESP:  Physical Exam Vitals and nursing note reviewed.  Constitutional:       General: She is not in acute distress.    Appearance: Normal appearance. She is obese. She is not ill-appearing.  HENT:     Head: Normocephalic and atraumatic.     Right Ear: External ear normal.     Left Ear: External ear normal.  Eyes:     Extraocular Movements: Extraocular movements intact.  Cardiovascular:     Rate and Rhythm: Normal rate.     Pulses: Normal pulses.  Pulmonary:     Effort: Pulmonary effort is normal. No respiratory distress.  Abdominal:     General: There is no distension.     Palpations: Abdomen is soft.  Musculoskeletal:        General: Tenderness present.     Cervical back: Neck supple.     Right lower leg: No edema.     Left lower leg: No edema.     Comments: Patient has good distal strength with no pain over the greater trochanters.  No clonus or focal weakness.  Pain with extension and facet loading of the lumbar spine.  Some tenderness across lumbar  spine and PSIS bilaterally.  Skin:    Findings: No erythema, lesion or rash.  Neurological:     General: No focal deficit present.     Mental Status: She is alert and oriented to person, place, and time.     Sensory: No sensory deficit.     Motor: No weakness or abnormal muscle tone.     Coordination: Coordination normal.  Psychiatric:        Mood and Affect: Mood normal.        Behavior: Behavior normal.    Ortho Exam  Imaging: No results found.  Past Medical/Family/Surgical/Social History: Medications & Allergies reviewed per EMR, new medications updated. Patient Active Problem List   Diagnosis Date Noted   CVA (cerebral vascular accident) ([place]) 04/10/2021   History of melanoma 06/20/2018   Chest pain 06/06/2018   Hypertension associated with diabetes ([place]) 06/06/2018   Hyperlipidemia associated with type 2 diabetes mellitus ([place]) 06/06/2018   Mediastinal adenopathy 06/06/2018   Elevated LFTs 05/02/2012   Lactic acidosis 05/02/2012   Cellulitis of right leg 05/01/2012   Diabetes mellitus  type 2 in obese ([place]) 05/01/2012   Hypokalemia 05/01/2012   Hyponatremia 05/01/2012   NAFLD (nonalcoholic fatty liver disease) 05/14/2011   Obesity 05/14/2011   Hemorrhoids 05/14/2011   Transaminitis 01/29/2011   Hepatomegaly 01/29/2011   Rectal bleed 01/29/2011   Dysphagia 01/29/2011   RUQ pain 01/29/2011   Hypercalcemia 01/29/2011   Anxiety 01/29/2011   GERD (gastroesophageal reflux disease) 01/29/2011   Past Medical History:  Diagnosis Date   Anxiety    Bell's palsy 01/08/2011   Chronic back pain    Depression    Diabetes mellitus    GERD (gastroesophageal reflux disease)    Hepatomegaly    Hypertension    Melanoma ([place]) 2009   Dr Nevada Crane, Stage 2, required only surgery   NAFLD (nonalcoholic fatty liver disease)    Skin cancer    Family History  Problem Relation Age of Onset   Heart disease Mother    Heart disease Maternal Grandmother    Heart disease Maternal Grandfather    COPD Father    Bladder Cancer Father    Anesthesia problems Neg Hx    Hypotension Neg Hx    Malignant hyperthermia Neg Hx    Pseudochol deficiency Neg Hx    Colon cancer Neg Hx    Past Surgical History:  Procedure Laterality Date   CESAREAN SECTION     COLONOSCOPY  03/09/2011   Dr Oneida Alar diverticulosis, internal and external hemorrhoids   ESOPHAGOGASTRODUODENOSCOPY  03/09/2011   Esophageal stricture dilated to 16 MM savory, NSAID- induced gastritis and duodenitis   LEG SURGERY     plates/screws/hx fx-right leg   MELANOMA EXCISION     right knee   SAVORY DILATION  03/09/2011   Social History   Occupational History   Occupation: Psychologist, clinical k/bookeeper    Fish farm manager: SELF-EMPLOYED    Comment: part time  Tobacco Use   Smoking status: Former    Packs/day: 0.50    Years: 11.00    Pack years: 5.50    Types: Cigarettes    Quit date: 03/03/1993    Years since quitting: 28.2   Smokeless tobacco: Never  Vaping Use   Vaping Use: Never used  Substance and Sexual Activity   Alcohol use: Yes     Comment: wine 2-3 glasses per weekend   Drug use: No   Sexual activity: Yes    Birth control/protection: Post-menopausal

## 2021-06-16 ENCOUNTER — Ambulatory Visit: Payer: Self-pay

## 2021-06-16 ENCOUNTER — Encounter: Payer: Self-pay | Admitting: Physical Medicine and Rehabilitation

## 2021-06-16 ENCOUNTER — Ambulatory Visit (INDEPENDENT_AMBULATORY_CARE_PROVIDER_SITE_OTHER): Payer: 59 | Admitting: Physical Medicine and Rehabilitation

## 2021-06-16 ENCOUNTER — Other Ambulatory Visit: Payer: Self-pay

## 2021-06-16 VITALS — BP 182/96 | HR 81

## 2021-06-16 DIAGNOSIS — M48062 Spinal stenosis, lumbar region with neurogenic claudication: Secondary | ICD-10-CM

## 2021-06-16 DIAGNOSIS — M5416 Radiculopathy, lumbar region: Secondary | ICD-10-CM

## 2021-06-16 MED ORDER — BETAMETHASONE SOD PHOS & ACET 6 (3-3) MG/ML IJ SUSP
12.0000 mg | Freq: Once | INTRAMUSCULAR | Status: AC
  Administered 2021-06-16: 12 mg

## 2021-06-16 NOTE — Progress Notes (Signed)
Pt state lower back pain. Pt state walking, standing and sitting makes the pain worse. Pt state she take pain meds to help ease her pain.  Numeric Pain Rating Scale and Functional Assessment Average Pain 8   In the last MONTH (on 0-10 scale) has pain interfered with the following?  1. General activity like being  able to carry out your everyday physical activities such as walking, climbing stairs, carrying groceries, or moving a chair?  Rating(10)   +Driver, +BT pt stop BT a week ago, -Dye Allergies.

## 2021-06-16 NOTE — Patient Instructions (Signed)

## 2021-06-17 NOTE — Procedures (Signed)
Lumbar Epidural Steroid Injection - Interlaminar Approach with Fluoroscopic Guidance  Patient: Shelby Morgan      Date of Birth: [birth year] MRN: [id number] PCP: Cory Munch, PA-C      Visit Date: 06/16/2021   Universal Protocol:     Consent Given By: the patient  Position: PRONE  Additional Comments: Vital signs were monitored before and after the procedure. Patient was prepped and draped in the usual sterile fashion. The correct patient, procedure, and site was verified.   Injection Procedure Details:   Procedure diagnoses: Lumbar radiculopathy [M54.16]   Meds Administered:  Meds ordered this encounter  Medications   betamethasone acetate-betamethasone sodium phosphate (CELESTONE) injection 12 mg     Laterality: Right  Location/Site:  L3-4  Needle: 4.5 in., 20 ga. Tuohy  Needle Placement: Paramedian epidural  Findings:   -Comments: Excellent flow of contrast into the epidural space.  Procedure Details: Using a paramedian approach from the side mentioned above, the region overlying the inferior lamina was localized under fluoroscopic visualization and the soft tissues overlying this structure were infiltrated with 4 ml. of 1% Lidocaine without Epinephrine. The Tuohy needle was inserted into the epidural space using a paramedian approach.   The epidural space was localized using loss of resistance along with counter oblique bi-planar fluoroscopic views.  After negative aspirate for air, blood, and CSF, a 2 ml. volume of Isovue-250 was injected into the epidural space and the flow of contrast was observed. Radiographs were obtained for documentation purposes.    The injectate was administered into the level noted above.   Additional Comments:  The patient tolerated the procedure well Dressing: 2 x 2 sterile gauze and Band-Aid    Post-procedure details: Patient was observed during the procedure. Post-procedure instructions were reviewed.  Patient left the  clinic in stable condition.

## 2021-06-17 NOTE — Progress Notes (Signed)
AHNESTI TOWNSEND - 65 y.o. female MRN [id number]  Date of birth: 1956/02/10  Office Visit Note: Visit Date: 06/16/2021 PCP: Cory Munch, PA-C Referred by: Ginger Organ  Subjective: Chief Complaint  Patient presents with   Lower Back - Pain   HPI:  Shelby Morgan is a 65 y.o. female who comes in today for planned repeat Right L3-4  Lumbar Interlaminar epidural steroid injection with fluoroscopic guidance.  The patient has failed conservative care including home exercise, medications, time and activity modification.  This injection will be diagnostic and hopefully therapeutic.  Please see requesting physician notes for further details and justification. Patient received more than 50% pain relief from prior injection.  Please see our prior note for further details and justification.  Patient has had a stroke since we last saw her.  She has been placed on Plavix and we did take her off for that for a week.  She will return to taking that.  This is something that she feels like she will not need to be on permanently.  Referring: Dr. Sanjuana Kava  ROS Otherwise per HPI.  Assessment & Plan: Visit Diagnoses:    ICD-10-CM   1. Lumbar radiculopathy  M54.16 XR C-ARM NO REPORT    Epidural Steroid injection    betamethasone acetate-betamethasone sodium phosphate (CELESTONE) injection 12 mg    2. Spinal stenosis of lumbar region with neurogenic claudication  M48.062 XR C-ARM NO REPORT    Epidural Steroid injection    betamethasone acetate-betamethasone sodium phosphate (CELESTONE) injection 12 mg      Plan: No additional findings.   Meds & Orders:  Meds ordered this encounter  Medications   betamethasone acetate-betamethasone sodium phosphate (CELESTONE) injection 12 mg    Orders Placed This Encounter  Procedures   XR C-ARM NO REPORT   Epidural Steroid injection    Follow-up: Return if symptoms worsen or fail to improve.   Procedures: No procedures performed  Lumbar  Epidural Steroid Injection - Interlaminar Approach with Fluoroscopic Guidance  Patient: Shelby Morgan      Date of Birth: [birth year] MRN: [id number] PCP: Cory Munch, PA-C      Visit Date: 06/16/2021   Universal Protocol:     Consent Given By: the patient  Position: PRONE  Additional Comments: Vital signs were monitored before and after the procedure. Patient was prepped and draped in the usual sterile fashion. The correct patient, procedure, and site was verified.   Injection Procedure Details:   Procedure diagnoses: Lumbar radiculopathy [M54.16]   Meds Administered:  Meds ordered this encounter  Medications   betamethasone acetate-betamethasone sodium phosphate (CELESTONE) injection 12 mg     Laterality: Right  Location/Site:  L3-4  Needle: 4.5 in., 20 ga. Tuohy  Needle Placement: Paramedian epidural  Findings:   -Comments: Excellent flow of contrast into the epidural space.  Procedure Details: Using a paramedian approach from the side mentioned above, the region overlying the inferior lamina was localized under fluoroscopic visualization and the soft tissues overlying this structure were infiltrated with 4 ml. of 1% Lidocaine without Epinephrine. The Tuohy needle was inserted into the epidural space using a paramedian approach.   The epidural space was localized using loss of resistance along with counter oblique bi-planar fluoroscopic views.  After negative aspirate for air, blood, and CSF, a 2 ml. volume of Isovue-250 was injected into the epidural space and the flow of contrast was observed. Radiographs were obtained for documentation purposes.  The injectate was administered into the level noted above.   Additional Comments:  The patient tolerated the procedure well Dressing: 2 x 2 sterile gauze and Band-Aid    Post-procedure details: Patient was observed during the procedure. Post-procedure instructions were reviewed.  Patient left the clinic in  stable condition.   Clinical History: MRI LUMBAR SPINE WITHOUT CONTRAST   TECHNIQUE: Multiplanar, multisequence MR imaging of the lumbar spine was performed. No intravenous contrast was administered.   COMPARISON:  Lumbar spine radiographs 09/25/2020   FINDINGS: Segmentation:  5 lumbar vertebrae.  Hypoplastic ribs at T12.   Alignment: Mild lumbar levoscoliosis. Trace retrolisthesis of L2 on L3. Bilateral L5 pars defects with 8 mm anterolisthesis of L5 on S1.   Vertebrae: No fracture or suspicious osseous lesion. Prominent degenerative endplate changes asymmetrically greater on the right at L1-2 and L2-3 including degenerative edema at L2-3.   Conus medullaris and cauda equina: Conus extends to the L1 level. Conus and cauda equina appear normal.   Paraspinal and other soft tissues: Unremarkable.   Disc levels:   Disc desiccation throughout the lumbar spine. Moderate to severe disc space narrowing at L1-2, L2-3, and L5-S1 with moderate narrowing at L3-4 and mild-to-moderate narrowing at L4-5.   T12-L1: Negative.   L1-2: Right eccentric disc bulging, endplate spurring, and mild facet hypertrophy result in mild right lateral recess stenosis and mild right neural foraminal stenosis without significant spinal stenosis.   L2-3: Prominent circumferential disc bulging eccentric to the right, endplate spurring, prominent dorsal epidural fat, and severe facet and ligamentum flavum hypertrophy result in severe spinal stenosis and severe right and mild-to-moderate left neural foraminal stenosis.   L3-4: Prominent circumferential disc bulging eccentric to the right, endplate spurring, and severe facet and ligamentum flavum hypertrophy result in mild spinal stenosis and moderate right neural foraminal stenosis.   L4-5: Disc bulging eccentric to the left and moderate facet and ligamentum flavum hypertrophy result in mild bilateral lateral recess stenosis and mild right and  moderate left neural foraminal stenosis without spinal stenosis.   L5-S1: Anterolisthesis with bulging uncovered disc, endplate spurring, and spurring at the pars defects result in moderate right and severe left neural foraminal stenosis without spinal stenosis.   IMPRESSION: 1. 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 2. Severe spinal stenosis and severe right neural foraminal stenosis at L2-3. 3. Grade 2 anterolisthesis at L5-S1 due to L5 pars defects with moderate right and severe left neural foraminal stenosis. 4. Mild spinal stenosis at L3-4.     Electronically Signed   By: Logan Bores M.D.   On: 11/01/2020 16:55     Objective:  VS:  HT:    WT:   BMI:     BP:(!) 182/96  HR:81bpm  TEMP: ( )  RESP:  Physical Exam Vitals and nursing note reviewed.  Constitutional:      General: She is not in acute distress.    Appearance: Normal appearance. She is obese. She is not ill-appearing.  HENT:     Head: Normocephalic and atraumatic.     Right Ear: External ear normal.     Left Ear: External ear normal.  Eyes:     Extraocular Movements: Extraocular movements intact.  Cardiovascular:     Rate and Rhythm: Normal rate.     Pulses: Normal pulses.  Pulmonary:     Effort: Pulmonary effort is normal. No respiratory distress.  Abdominal:     General: There is no distension.     Palpations: Abdomen is  soft.  Musculoskeletal:        General: Tenderness present.     Cervical back: Neck supple.     Right lower leg: No edema.     Left lower leg: No edema.     Comments: Patient has good distal strength with no pain over the greater trochanters.  No clonus or focal weakness.  Skin:    Findings: No erythema, lesion or rash.  Neurological:     General: No focal deficit present.     Mental Status: She is alert and oriented to person, place, and time.     Sensory: No sensory deficit.     Motor: No weakness or abnormal muscle tone.     Coordination: Coordination  normal.  Psychiatric:        Mood and Affect: Mood normal.        Behavior: Behavior normal.     Imaging: XR C-ARM NO REPORT  Result Date: 06/16/2021 Please see Notes tab for imaging impression.

## 2021-07-27 DIAGNOSIS — I639 Cerebral infarction, unspecified: Secondary | ICD-10-CM

## 2021-07-27 HISTORY — DX: Cerebral infarction, unspecified: I63.9

## 2021-08-02 ENCOUNTER — Telehealth: Payer: Self-pay | Admitting: Emergency Medicine

## 2021-08-02 NOTE — Progress Notes (Signed)
The patient no-showed for appointment despite this provider sending direct link, reaching out via phone with no response and waiting for at least 10 minutes from appointment time for patient to join. They will be marked as a NS for this appointment/time.   Lestine Box, PA-C

## 2022-01-06 ENCOUNTER — Emergency Department (HOSPITAL_COMMUNITY): Payer: Medicare HMO

## 2022-01-06 ENCOUNTER — Encounter (HOSPITAL_COMMUNITY): Payer: Self-pay | Admitting: *Deleted

## 2022-01-06 ENCOUNTER — Inpatient Hospital Stay (HOSPITAL_COMMUNITY)
Admission: EM | Admit: 2022-01-06 | Discharge: 2022-01-14 | DRG: 062 | Disposition: A | Discharge status: Rehabilitation Facility | Payer: Medicare HMO | Attending: Neurology | Admitting: Neurology

## 2022-01-06 ENCOUNTER — Inpatient Hospital Stay (HOSPITAL_COMMUNITY): Payer: Medicare HMO

## 2022-01-06 DIAGNOSIS — Z20822 Contact with and (suspected) exposure to covid-19: Secondary | ICD-10-CM | POA: Diagnosis not present

## 2022-01-06 DIAGNOSIS — Z85820 Personal history of malignant melanoma of skin: Secondary | ICD-10-CM | POA: Diagnosis not present

## 2022-01-06 DIAGNOSIS — I10 Essential (primary) hypertension: Secondary | ICD-10-CM | POA: Diagnosis not present

## 2022-01-06 DIAGNOSIS — F419 Anxiety disorder, unspecified: Secondary | ICD-10-CM | POA: Diagnosis not present

## 2022-01-06 DIAGNOSIS — D72829 Elevated white blood cell count, unspecified: Secondary | ICD-10-CM | POA: Diagnosis not present

## 2022-01-06 DIAGNOSIS — E1165 Type 2 diabetes mellitus with hyperglycemia: Secondary | ICD-10-CM | POA: Diagnosis present

## 2022-01-06 DIAGNOSIS — R29810 Facial weakness: Secondary | ICD-10-CM | POA: Diagnosis present

## 2022-01-06 DIAGNOSIS — F32A Depression, unspecified: Secondary | ICD-10-CM | POA: Diagnosis not present

## 2022-01-06 DIAGNOSIS — Z87891 Personal history of nicotine dependence: Secondary | ICD-10-CM | POA: Diagnosis not present

## 2022-01-06 DIAGNOSIS — Z85828 Personal history of other malignant neoplasm of skin: Secondary | ICD-10-CM

## 2022-01-06 DIAGNOSIS — E785 Hyperlipidemia, unspecified: Secondary | ICD-10-CM | POA: Diagnosis not present

## 2022-01-06 DIAGNOSIS — Z825 Family history of asthma and other chronic lower respiratory diseases: Secondary | ICD-10-CM | POA: Diagnosis not present

## 2022-01-06 DIAGNOSIS — R29704 NIHSS score 4: Secondary | ICD-10-CM | POA: Diagnosis not present

## 2022-01-06 DIAGNOSIS — E1140 Type 2 diabetes mellitus with diabetic neuropathy, unspecified: Secondary | ICD-10-CM | POA: Diagnosis not present

## 2022-01-06 DIAGNOSIS — I6381 Other cerebral infarction due to occlusion or stenosis of small artery: Principal | ICD-10-CM | POA: Diagnosis present

## 2022-01-06 DIAGNOSIS — I639 Cerebral infarction, unspecified: Secondary | ICD-10-CM | POA: Diagnosis present

## 2022-01-06 DIAGNOSIS — Z8673 Personal history of transient ischemic attack (TIA), and cerebral infarction without residual deficits: Secondary | ICD-10-CM | POA: Diagnosis not present

## 2022-01-06 DIAGNOSIS — Z8249 Family history of ischemic heart disease and other diseases of the circulatory system: Secondary | ICD-10-CM

## 2022-01-06 DIAGNOSIS — M47812 Spondylosis without myelopathy or radiculopathy, cervical region: Secondary | ICD-10-CM | POA: Diagnosis not present

## 2022-01-06 DIAGNOSIS — Z7984 Long term (current) use of oral hypoglycemic drugs: Secondary | ICD-10-CM

## 2022-01-06 DIAGNOSIS — E041 Nontoxic single thyroid nodule: Secondary | ICD-10-CM | POA: Diagnosis not present

## 2022-01-06 DIAGNOSIS — M48061 Spinal stenosis, lumbar region without neurogenic claudication: Secondary | ICD-10-CM | POA: Diagnosis present

## 2022-01-06 DIAGNOSIS — E1169 Type 2 diabetes mellitus with other specified complication: Secondary | ICD-10-CM | POA: Diagnosis not present

## 2022-01-06 DIAGNOSIS — Z7982 Long term (current) use of aspirin: Secondary | ICD-10-CM

## 2022-01-06 DIAGNOSIS — Z8052 Family history of malignant neoplasm of bladder: Secondary | ICD-10-CM | POA: Diagnosis not present

## 2022-01-06 DIAGNOSIS — K760 Fatty (change of) liver, not elsewhere classified: Secondary | ICD-10-CM | POA: Diagnosis not present

## 2022-01-06 DIAGNOSIS — Z79899 Other long term (current) drug therapy: Secondary | ICD-10-CM

## 2022-01-06 DIAGNOSIS — I6623 Occlusion and stenosis of bilateral posterior cerebral arteries: Secondary | ICD-10-CM | POA: Diagnosis not present

## 2022-01-06 DIAGNOSIS — G8194 Hemiplegia, unspecified affecting left nondominant side: Secondary | ICD-10-CM | POA: Diagnosis present

## 2022-01-06 DIAGNOSIS — G8190 Hemiplegia, unspecified affecting unspecified side: Secondary | ICD-10-CM | POA: Diagnosis not present

## 2022-01-06 DIAGNOSIS — Z6841 Body Mass Index (BMI) 40.0 and over, adult: Secondary | ICD-10-CM | POA: Diagnosis not present

## 2022-01-06 DIAGNOSIS — I63233 Cerebral infarction due to unspecified occlusion or stenosis of bilateral carotid arteries: Secondary | ICD-10-CM | POA: Diagnosis not present

## 2022-01-06 DIAGNOSIS — N179 Acute kidney failure, unspecified: Secondary | ICD-10-CM | POA: Diagnosis present

## 2022-01-06 DIAGNOSIS — R531 Weakness: Secondary | ICD-10-CM | POA: Diagnosis not present

## 2022-01-06 DIAGNOSIS — M5416 Radiculopathy, lumbar region: Secondary | ICD-10-CM | POA: Diagnosis present

## 2022-01-06 DIAGNOSIS — E11649 Type 2 diabetes mellitus with hypoglycemia without coma: Secondary | ICD-10-CM | POA: Diagnosis not present

## 2022-01-06 DIAGNOSIS — Z743 Need for continuous supervision: Secondary | ICD-10-CM | POA: Diagnosis not present

## 2022-01-06 DIAGNOSIS — D649 Anemia, unspecified: Secondary | ICD-10-CM | POA: Diagnosis not present

## 2022-01-06 DIAGNOSIS — E669 Obesity, unspecified: Secondary | ICD-10-CM | POA: Diagnosis not present

## 2022-01-06 DIAGNOSIS — I6782 Cerebral ischemia: Secondary | ICD-10-CM | POA: Diagnosis not present

## 2022-01-06 DIAGNOSIS — I63331 Cerebral infarction due to thrombosis of right posterior cerebral artery: Secondary | ICD-10-CM | POA: Diagnosis not present

## 2022-01-06 DIAGNOSIS — G4733 Obstructive sleep apnea (adult) (pediatric): Secondary | ICD-10-CM | POA: Diagnosis present

## 2022-01-06 DIAGNOSIS — E782 Mixed hyperlipidemia: Secondary | ICD-10-CM | POA: Diagnosis not present

## 2022-01-06 DIAGNOSIS — I672 Cerebral atherosclerosis: Secondary | ICD-10-CM | POA: Diagnosis not present

## 2022-01-06 DIAGNOSIS — I6601 Occlusion and stenosis of right middle cerebral artery: Secondary | ICD-10-CM | POA: Diagnosis not present

## 2022-01-06 DIAGNOSIS — R000 Tachycardia, unspecified: Secondary | ICD-10-CM | POA: Diagnosis not present

## 2022-01-06 DIAGNOSIS — M79671 Pain in right foot: Secondary | ICD-10-CM | POA: Diagnosis not present

## 2022-01-06 DIAGNOSIS — R471 Dysarthria and anarthria: Secondary | ICD-10-CM | POA: Diagnosis present

## 2022-01-06 DIAGNOSIS — K219 Gastro-esophageal reflux disease without esophagitis: Secondary | ICD-10-CM | POA: Diagnosis present

## 2022-01-06 DIAGNOSIS — I6389 Other cerebral infarction: Secondary | ICD-10-CM | POA: Diagnosis not present

## 2022-01-06 HISTORY — DX: Spinal stenosis, lumbar region without neurogenic claudication: M48.061

## 2022-01-06 HISTORY — DX: Unspecified hemorrhoids: K64.9

## 2022-01-06 HISTORY — DX: Diverticulosis of intestine, part unspecified, without perforation or abscess without bleeding: K57.90

## 2022-01-06 LAB — COMPREHENSIVE METABOLIC PANEL
ALT: 38 U/L (ref 0–44)
AST: 36 U/L (ref 15–41)
Albumin: 3.8 g/dL (ref 3.5–5.0)
Alkaline Phosphatase: 61 U/L (ref 38–126)
Anion gap: 8 (ref 5–15)
BUN: 14 mg/dL (ref 8–23)
CO2: 24 mmol/L (ref 22–32)
Calcium: 9 mg/dL (ref 8.9–10.3)
Chloride: 105 mmol/L (ref 98–111)
Creatinine, Ser: 0.91 mg/dL (ref 0.44–1.00)
GFR, Estimated: >60 mL/min (ref >60)
Glucose, Bld: 205 mg/dL — ABNORMAL HIGH (ref 70–99)
Potassium: 3.6 mmol/L (ref 3.5–5.1)
Sodium: 137 mmol/L (ref 135–145)
Total Bilirubin: 0.7 mg/dL (ref 0.3–1.2)
Total Protein: 7.7 g/dL (ref 6.5–8.1)

## 2022-01-06 LAB — I-STAT CHEM 8, ED
BUN: 13 mg/dL (ref 8–23)
Calcium, Ion: 1.13 mmol/L — ABNORMAL LOW (ref 1.15–1.40)
Chloride: 102 mmol/L (ref 98–111)
Creatinine, Ser: 0.8 mg/dL (ref 0.44–1.00)
Glucose, Bld: 205 mg/dL — ABNORMAL HIGH (ref 70–99)
HCT: 40 % (ref 36.0–46.0)
Hemoglobin: 13.6 g/dL (ref 12.0–15.0)
Potassium: 3.7 mmol/L (ref 3.5–5.1)
Sodium: 140 mmol/L (ref 135–145)
TCO2: 24 mmol/L (ref 22–32)

## 2022-01-06 LAB — CBG MONITORING, ED: Glucose-Capillary: 192 mg/dL — ABNORMAL HIGH (ref 70–99)

## 2022-01-06 LAB — CBC
HCT: 39.7 % (ref 36.0–46.0)
Hemoglobin: 13.1 g/dL (ref 12.0–15.0)
MCH: 27.7 pg (ref 26.0–34.0)
MCHC: 33 g/dL (ref 30.0–36.0)
MCV: 83.9 fL (ref 80.0–100.0)
Platelets: 389 10*3/uL (ref 150–400)
RBC: 4.73 MIL/uL (ref 3.87–5.11)
RDW: 13.5 % (ref 11.5–15.5)
WBC: 8.3 10*3/uL (ref 4.0–10.5)
nRBC: 0 % (ref 0.0–0.2)

## 2022-01-06 LAB — DIFFERENTIAL
Abs Immature Granulocytes: 0.03 10*3/uL (ref 0.00–0.07)
Basophils Absolute: 0.1 10*3/uL (ref 0.0–0.1)
Basophils Relative: 1 %
Eosinophils Absolute: 0.3 10*3/uL (ref 0.0–0.5)
Eosinophils Relative: 4 %
Immature Granulocytes: 0 %
Lymphocytes Relative: 24 %
Lymphs Abs: 2 10*3/uL (ref 0.7–4.0)
Monocytes Absolute: 0.7 10*3/uL (ref 0.1–1.0)
Monocytes Relative: 9 %
Neutro Abs: 5.1 10*3/uL (ref 1.7–7.7)
Neutrophils Relative %: 62 %

## 2022-01-06 LAB — PROTIME-INR
INR: 1 (ref 0.8–1.2)
Prothrombin Time: 12.6 seconds (ref 11.4–15.2)

## 2022-01-06 LAB — RESP PANEL BY RT-PCR (FLU A&B, COVID) ARPGX2
Influenza A by PCR: NEGATIVE
Influenza B by PCR: NEGATIVE
SARS Coronavirus 2 by RT PCR: NEGATIVE

## 2022-01-06 LAB — RAPID URINE DRUG SCREEN, HOSP PERFORMED
Amphetamines: NOT DETECTED
Barbiturates: NOT DETECTED
Benzodiazepines: NOT DETECTED
Cocaine: NOT DETECTED
Opiates: NOT DETECTED
Tetrahydrocannabinol: NOT DETECTED

## 2022-01-06 LAB — URINALYSIS, ROUTINE W REFLEX MICROSCOPIC
Bilirubin Urine: NEGATIVE
Glucose, UA: NEGATIVE mg/dL
Hgb urine dipstick: NEGATIVE
Ketones, ur: NEGATIVE mg/dL
Leukocytes,Ua: NEGATIVE
Nitrite: NEGATIVE
Protein, ur: NEGATIVE mg/dL
Specific Gravity, Urine: 1.005 (ref 1.005–1.030)
pH: 6 (ref 5.0–8.0)

## 2022-01-06 LAB — ETHANOL: Alcohol, Ethyl (B): <10 mg/dL (ref <10)

## 2022-01-06 LAB — APTT: aPTT: 28 seconds (ref 24–36)

## 2022-01-06 LAB — GLUCOSE, CAPILLARY: Glucose-Capillary: 138 mg/dL — ABNORMAL HIGH (ref 70–99)

## 2022-01-06 MED ORDER — IOHEXOL 350 MG/ML SOLN
75.0000 mL | Freq: Once | INTRAVENOUS | Status: AC | PRN
  Administered 2022-01-07: 75 mL via INTRAVENOUS

## 2022-01-06 MED ORDER — SODIUM CHLORIDE 0.9 % IV SOLN: INTRAVENOUS | Status: DC

## 2022-01-06 MED ORDER — TENECTEPLASE FOR STROKE: 25.0000 mg | PACK | Freq: Once | INTRAVENOUS | Status: AC

## 2022-01-06 MED ORDER — ACETAMINOPHEN 160 MG/5ML PO SOLN: 650.0000 mg | ORAL | Status: DC | PRN

## 2022-01-06 MED ORDER — STROKE: EARLY STAGES OF RECOVERY BOOK
Freq: Once | Status: AC
Start: 2022-01-06 — End: 2022-01-06
  Filled 2022-01-06: qty 1

## 2022-01-06 MED ORDER — CLEVIDIPINE BUTYRATE 0.5 MG/ML IV EMUL
0.0000 mg/h | INTRAVENOUS | Status: DC
  Administered 2022-01-07: 6 mg/h via INTRAVENOUS
  Administered 2022-01-07: 1 mg/h via INTRAVENOUS
  Filled 2022-01-06 (×4): qty 50

## 2022-01-06 MED ORDER — PANTOPRAZOLE SODIUM 40 MG IV SOLR
40.0000 mg | Freq: Every day | INTRAVENOUS | Status: DC
  Administered 2022-01-06 – 2022-01-07 (×2): 40 mg via INTRAVENOUS
  Filled 2022-01-06 (×2): qty 10

## 2022-01-06 MED ORDER — ACETAMINOPHEN 325 MG PO TABS
650.0000 mg | ORAL_TABLET | ORAL | Status: DC | PRN
  Administered 2022-01-07 – 2022-01-14 (×13): 650 mg via ORAL
  Filled 2022-01-06 (×13): qty 2

## 2022-01-06 MED ORDER — SENNOSIDES-DOCUSATE SODIUM 8.6-50 MG PO TABS: 1.0000 | ORAL_TABLET | Freq: Every evening | ORAL | Status: DC | PRN

## 2022-01-06 MED ORDER — LABETALOL HCL 5 MG/ML IV SOLN
10.0000 mg | INTRAVENOUS | Status: DC | PRN
  Administered 2022-01-07: 10 mg via INTRAVENOUS
  Filled 2022-01-06: qty 4

## 2022-01-06 MED ORDER — CLEVIDIPINE BUTYRATE 0.5 MG/ML IV EMUL: 0.0000 mg/h | INTRAVENOUS | Status: DC

## 2022-01-06 MED ORDER — TENECTEPLASE FOR STROKE
PACK | INTRAVENOUS | Status: AC
  Administered 2022-01-06: 25 mg via INTRAVENOUS
  Filled 2022-01-06: qty 10

## 2022-01-06 MED ORDER — LABETALOL HCL 5 MG/ML IV SOLN
INTRAVENOUS | Status: AC
  Administered 2022-01-06: 10 mg
  Filled 2022-01-06: qty 4

## 2022-01-06 MED ORDER — ACETAMINOPHEN 650 MG RE SUPP: 650.0000 mg | RECTAL | Status: DC | PRN

## 2022-01-06 NOTE — ED Notes (Signed)
[facility] onsite to transport pt

## 2022-01-06 NOTE — ED Triage Notes (Signed)
Pt brought in by [facility] from home with c/o sudden onset of left sided weakness and no feeling on the left side of her body that started 1 hour ago.

## 2022-01-06 NOTE — ED Notes (Signed)
Pt crying talking to mother about calling son to tell him she is in the hospital.

## 2022-01-06 NOTE — Consult Note (Signed)
1741-Code Stroke called from EMS elert 1744-Dr Bhagat on camera 1755-Left for CT 1806-Returned from CT

## 2022-01-06 NOTE — ED Provider Notes (Signed)
Patient transferred here from any pain hospital after receiving TNKase for stroke.  Will contact neurology for admission   Lacretia Leigh, MD 01/06/22 2020

## 2022-01-06 NOTE — ED Notes (Signed)
Pt in CT.

## 2022-01-06 NOTE — ED Provider Notes (Signed)
[facility] Provider Note   CSN: [id number] Arrival date & time: 01/06/22  1749     History {Add pertinent medical, surgical, social history, OB history to HPI:1} Chief Complaint  Patient presents with   Code Stroke    Shelby Morgan is a 66 y.o. female with hx of CVA, on aspirin at home, presenting from home by EMS concern for left-sided weakness.  Last known well at approximately 4 4:30 PM.  She noted thereafter that she had lost feeling in her left arm and leg, and the left side of her face as well.  She reports weakness in her left arm and leg.  She denies headache.  HPI     Home Medications Prior to Admission medications   Medication Sig Start Date End Date Taking? Authorizing Provider  acetaminophen (TYLENOL) 325 MG tablet Take 650 mg by mouth every 6 (six) hours as needed for moderate pain or mild pain.    [provider]  aspirin EC 81 MG EC tablet Take 1 tablet (81 mg total) by mouth daily. Swallow whole. 04/12/21   Shahmehdi, Valeria Batman, MD  busPIRone (BUSPAR) 5 MG tablet Take 5 mg by mouth 2 (two) times daily. 06/13/18   [provider]  clopidogrel (PLAVIX) 75 MG tablet Take 75 mg by mouth daily. 05/15/21   [provider]  cyanocobalamin 100 MCG tablet Take 100 mcg by mouth daily.    [provider]  furosemide (LASIX) 20 MG tablet Take 20 mg by mouth every morning.    [provider]  lisinopril (PRINIVIL,ZESTRIL) 10 MG tablet Take 10 mg by mouth every morning.  05/16/18   [provider]  lisinopril (ZESTRIL) 20 MG tablet Take 20 mg by mouth daily. 05/22/21   [provider]  meloxicam (MOBIC) 15 MG tablet Take 15 mg by mouth daily. 04/25/21   [provider]  metFORMIN (GLUCOPHAGE-XR) 500 MG 24 hr tablet Take 1,000 mg by mouth 2 (two) times daily.     [provider]  Multiple Vitamins-Minerals (MULTI-BETIC DIABETES) TABS Take 2 tablets by mouth daily.    [provider]  Omega-3 Fatty Acids (FISH OIL) 1000 MG CAPS Take 1 capsule by mouth daily at 6 (six) AM.    [provider]  pantoprazole (PROTONIX) 40 MG tablet TAKE ONE TABLET BY MOUTH ONCE DAILY Patient taking differently: Take 40 mg by mouth every morning. 07/12/15   Mahala Menghini, PA-C  simvastatin (ZOCOR) 40 MG tablet Take 1 tablet (40 mg total) by mouth every evening. 04/11/21   Shahmehdi, Valeria Batman, MD  sitaGLIPtin (JANUVIA) 100 MG tablet Take 100 mg by mouth every morning.    [provider]  traMADol (ULTRAM) 50 MG tablet Take 50 mg by mouth every 4 (four) hours as needed. 04/08/21   [provider]  traZODone (DESYREL) 100 MG tablet Take 100 mg by mouth at bedtime as needed for sleep. 06/13/18   [provider]      Allergies    Codeine    Review of Systems   Review of Systems  Physical Exam Updated Vital Signs There were no vitals taken for this visit. Physical Exam Constitutional:      General: She is not in acute distress. HENT:     Head: Normocephalic and atraumatic.  Eyes:     Conjunctiva/sclera: Conjunctivae normal.     Pupils: Pupils are equal, round, and reactive to light.  Cardiovascular:     Rate and  Rhythm: Normal rate and regular rhythm.  Pulmonary:     Effort: Pulmonary effort is normal. No respiratory distress.  Abdominal:     General: There is no distension.     Tenderness: There is no abdominal tenderness.  Skin:    General: Skin is warm and dry.  Neurological:     General: No focal deficit present.     Mental Status: She is alert. Mental status is at baseline.     Comments: Paresthesias the left half of the face, cranial nerves visual fields otherwise grossly intact.  3 out of 5 strength of the left upper and left lower extremity, pronator drift positive on left extremity.  Right extremity unaffected. Patient reports complete lack of sensation of the left arm and leg entirely  Psychiatric:        Mood and Affect:  Mood normal.        Behavior: Behavior normal.     ED Results / Procedures / Treatments   Labs (all labs ordered are listed, but only abnormal results are displayed) Labs Reviewed  CBG MONITORING, ED - Abnormal; Notable for the following components:      Result Value   Glucose-Capillary 192 (*)    All other components within normal limits  RESP PANEL BY RT-PCR (FLU A&B, COVID) ARPGX2  ETHANOL  PROTIME-INR  APTT  CBC  DIFFERENTIAL  COMPREHENSIVE METABOLIC PANEL  RAPID URINE DRUG SCREEN, HOSP PERFORMED  URINALYSIS, ROUTINE W REFLEX MICROSCOPIC  I-STAT CHEM 8, ED    EKG None  Radiology No results found.  Procedures Procedures  {Document cardiac monitor, telemetry assessment procedure when appropriate:1}  Medications Ordered in ED Medications - No data to display  ED Course/ Medical Decision Making/ A&P                           Medical Decision Making Amount and/or Complexity of Data Reviewed Labs: ordered. Radiology: ordered.   This patient presents to the ED with concern for left-sided weakness and paresthesia. This involves an extensive number of treatment options, and is a complaint that carries with it a high risk of complications and morbidity.  The differential diagnosis includes CVA versus TIA versus other  Co-morbidities that complicate the patient evaluation: History of prior stroke raises concerns for recurring stroke or cardiovascular disease  Additional history obtained from EMS on arrival  External records from outside source obtained and reviewed including MRI of the brain from September 2022 showing acute small vessel infarction along the lateral left thalamus  I ordered and personally interpreted labs.  The pertinent results include:  ***  I ordered imaging studies including CT of the head I independently visualized and interpreted imaging which showed *** I agree with the radiologist interpretation  The patient was maintained on a cardiac  monitor.  I personally viewed and interpreted the cardiac monitored which showed an underlying rhythm of: ***  Per my interpretation the patient's ECG shows ***  I ordered medication including ***  for *** I have reviewed the patients home medicines and have made adjustments as needed  Test Considered: ***  I requested consultation with the neurology,  and discussed lab and imaging findings as well as pertinent plan - they recommend: ***  After the interventions noted above, I reevaluated the patient and found that they have: {resolved/improved/worsened:23923::"improved"}  Social Determinants of Health:***  Dispostion:  After consideration of the diagnostic results and the patients response to treatment, I feel that  the patent would benefit from ***.   {Document critical care time when appropriate:1} {Document review of labs and clinical decision tools ie heart score, Chads2Vasc2 etc:1}  {Document your independent review of radiology images, and any outside records:1} {Document your discussion with family members, caretakers, and with consultants:1} {Document social determinants of health affecting pt's care:1} {Document your decision making why or why not admission, treatments were needed:1} Final Clinical Impression(s) / ED Diagnoses Final diagnoses:  None    Rx / DC Orders ED Discharge Orders     None

## 2022-01-06 NOTE — Consult Note (Signed)
[facility] Telemedicine Consult  Requesting Provider: Octaviano Glow Consult Participants: Myself, bedside nurse, EMS, Dr. Langston Masker, [facility]   Location of the provider: [facility] Location of the patient: Shelby Morgan  This consult was provided via telemedicine with 2-way video and audio communication. The patient/family was informed that care would be provided in this way and agreed to receive care in this manner.    Chief Complaint: Left sided numbness and weakness  HPI: This is a 66 year old woman with a past medical history significant for hypertension, hyperlipidemia, diabetes, obesity (BMI 42.18), left thalamic stroke (04/11/2021, no residual), anxiety, remote melanoma (2012).  She reports she has been watching TV at 4 PM and knows for sure she was last her normal self at 4:30 PM.  Subsequently she had left-sided severe numbness and weakness that she describes as severe (is still able to move the arm and leg on my evaluation).  TNK checklist was reviewed with her and she was deemed to be a candidate based on her answers.  Risks and benefits were reviewed and she elected to proceed with treatment  LKW: 4:30 PM TNK given?:  Yes, at [facility] at 18:15 IR Thrombectomy? No, exam not consistent with LVO Modified Rankin Scale: 0-Completely asymptomatic and back to baseline post- stroke Time of teleneurologist evaluation: 5:45 PM  Exam: Current vital signs: BP (!) 162/92   Pulse 85   Temp 98.6 F (37 C)   Resp 20   Wt 104.6 kg   SpO2 96%   BMI 42.18 kg/m  Vital signs in last 24 hours: Temp:  [98.6 F (37 C)] 98.6 F (37 C) (06/13 1845) Pulse Rate:  [82-103] 85 (06/13 1845) Resp:  [20-22] 20 (06/13 1845) BP: (139-194)/(55-97) 162/92 (06/13 1845) SpO2:  [93 %-96 %] 96 % (06/13 1845) Weight:  [104.6 kg] 104.6 kg (06/13 1808)  General: Highly anxious Pulmonary: breathing comfortably Cardiac: regular rate and rhythm on monitor   NIH Stroke scale 1A: Level  of Consciousness - 0 1B: Ask Month and Age - 0 1C: 'Blink Eyes' & 'Squeeze Hands' - 0 2: Test Horizontal Extraocular Movements - 0 3: Test Visual Fields - 0 4: Test Facial Palsy - 0 5A: Test Left Arm Motor Drift - 1  5B: Test Right Arm Motor Drift - 0 6A: Test Left Leg Motor Drift - 1 6B: Test Right Leg Motor Drift - 0 7: Test Limb Ataxia - 0 8: Test Sensation - 2 (severe loss of sensation on the left face/arm/leg) 9: Test Language/Aphasia- 0 10: Test Dysarthria - 0 11: Test Extinction/Inattention - 0 NIHSS score: 4   Imaging Reviewed:  Head CT personally reviewed, agree with radiology no acute intracranial process CTA pending at the time of this note, but examination not consistent with large vessel occlusion   Labs reviewed in epic and pertinent values follow:  Basic Metabolic Panel: Recent Labs  Lab 01/06/22 1821 01/06/22 1831  Morgan 137 140  K 3.6 3.7  CL 105 102  CO2 24  --   GLUCOSE 205* 205*  BUN 14 13  CREATININE 0.91 0.80  CALCIUM 9.0  --     CBC: Recent Labs  Lab 01/06/22 1821 01/06/22 1831  WBC 8.3  --   NEUTROABS 5.1  --   HGB 13.1 13.6  HCT 39.7 40.0  MCV 83.9  --   PLT 389  --     Coagulation Studies: Recent Labs    01/06/22 1821  LABPROT 12.6  INR 1.0  Assessment: Shelby Morgan is a 66 year old woman with multiple vascular risk factors presenting with likely small vessel stroke in the right hemisphere  #Lacunar stroke s/p TNK - Stroke labs HgbA1c, fasting lipid panel - MRI brain 24 hours post TNK - Frequent neuro checks - Echocardiogram - Hold antiplatelets until post TNK imaging confirms no significant hemorrhagic transformation - Risk factor modification, diet, exercise and weight loss counseling - Telemetry monitoring - Blood pressure goal   - Post tPA for 24  hours < 180/105, clevidipine and labetalol ordered - PT consult, OT consult, Speech consult, unless patient returns fully to baseline - Accepted for admission to [facility] by Dr. Lynnae Sandhoff under stroke service.  Given bed availability, may need ED to ED transfer pending ICU bed available  Lesleigh Noe MD-PhD [facility] [phone] If 8pm-8am, please page neurology on call as listed in [place].  CRITICAL CARE Performed by: Lorenza Chick   Total critical care time: 50 minutes  Critical care time was exclusive of separately billable procedures and treating other patients.  Critical care was necessary to treat or prevent imminent or life-threatening deterioration.  Critical care was time spent personally by me on the following activities: development of treatment plan with patient and/or surrogate as well as nursing, discussions with consultants, evaluation of patient's response to treatment, examination of patient, obtaining history from patient or surrogate, ordering and performing treatments and interventions, ordering and review of laboratory studies, ordering and review of radiographic studies, pulse oximetry and re-evaluation of patient's condition.

## 2022-01-06 NOTE — H&P (Incomplete)
Admission H&P    Chief Complaint: Left sided numbness and weakness  HPI: DARL BRISBIN is a 66 y.o. female with a PMHx of Bell's palsy, HLD, obesity, DM, depression, HTN, melanoma in 2009, NAFLD and left thalamic stroke in 2022 who presented to the [facility] ED this evening with acute onset of left sided numbness and weakness. She was seen by Telenreurology and deemed to be a TNK candidate. TNK was administered at 1815. NIHSS was 4. Her exam was not consistent with LVO. The patient was transferred to [facility] for post-TNK monitoring and stroke work up.   HPI from Dr. Lyn Records teleneurology note has been reviewed: "This is a 66 year old woman with a past medical history significant for hypertension, hyperlipidemia, diabetes, obesity (BMI 42.18), left thalamic stroke (04/11/2021, no residual), anxiety, remote melanoma (2012). She reports she has been watching TV at 4 PM and knows for sure she was last her normal self at 4:30 PM.  Subsequently she had left-sided severe numbness and weakness that she describes as severe (is still able to move the arm and leg on my evaluation).  TNK checklist was reviewed with her and she was deemed to be a candidate based on her answers.  Risks and benefits were reviewed and she elected to proceed with treatment."  Past Medical History:  Diagnosis Date   Anxiety    Bell's palsy 01/08/2011   Chronic back pain    Depression    Diabetes mellitus    GERD (gastroesophageal reflux disease)    Hepatomegaly    Hypertension    Melanoma ([place]) 2009   Dr Nevada Crane, Stage 2, required only surgery   NAFLD (nonalcoholic fatty liver disease)    Skin cancer    Stroke ([place]) 07/2021    Past Surgical History:  Procedure Laterality Date   CESAREAN SECTION     COLONOSCOPY  03/09/2011   Dr Oneida Alar diverticulosis, internal and external hemorrhoids   ESOPHAGOGASTRODUODENOSCOPY  03/09/2011   Esophageal stricture dilated to 16 MM savory, NSAID- induced gastritis and duodenitis   LEG SURGERY      plates/screws/hx fx-right leg   MELANOMA EXCISION     right knee   SAVORY DILATION  03/09/2011    Family History  Problem Relation Age of Onset   Heart disease Mother    Heart disease Maternal Grandmother    Heart disease Maternal Grandfather    COPD Father    Bladder Cancer Father    Anesthesia problems Neg Hx    Hypotension Neg Hx    Malignant hyperthermia Neg Hx    Pseudochol deficiency Neg Hx    Colon cancer Neg Hx    Social History:  reports that she quit smoking about 28 years ago. Her smoking use included cigarettes. She has a 5.50 pack-year smoking history. She has never used smokeless tobacco. She reports current alcohol use. She reports that she does not use drugs.  Allergies:  Allergies  Allergen Reactions   Codeine Nausea And Vomiting    ROS: As per HPI. She does not endorse any additional complaints.    Physical Examination: Blood pressure (!) 142/67, pulse 84, temperature 98 F (36.7 C), resp. rate 18, weight 104.6 kg, SpO2 92 %.  HEENT-  [place]/AT  Lungs - Respirations unlabored Extremities - No edema  Neurologic Examination: Mental Status: Alert, oriented x 5. Somewhat childlike affect. Frequently giggles with nervous affect. Speech fluent with intact naming. Comprehension intact; able to follow all commands without difficulty. Cranial Nerves: II: Temporal visual fields intact  with no extinction to DSS. PERRL  III,IV, VI: No ptosis. EOMI.  V: Temp sensation decreased subjectively on the left  VII: Smile symmetric VIII: Hearing intact to voice IX,X: No hoarseness XI: Symmetric shoulder shrug XII: Midline tongue extension Motor: RUE and RLE 5/5 LUE and LLE with bobbing drift on testing of antigravity strength, but without loss of net heights of limbs above bed Sensory: Subjectively is insensate to touch, temperature and pressure applied to LUE and LLE. Intact sensation on the right.  Deep Tendon Reflexes: 2+ and symmetric throughout Cerebellar: No  ataxia with FNF bilaterally  Gait: Deferred  Results for orders placed or performed during the hospital encounter of 01/06/22 (from the past 48 hour(s))  CBG monitoring, ED     Status: Abnormal   Collection Time: 01/06/22  5:50 PM  Result Value Ref Range   Glucose-Capillary 192 (H) 70 - 99 mg/dL    Comment: Glucose reference range applies only to samples taken after fasting for at least 8 hours.  Ethanol     Status: None   Collection Time: 01/06/22  6:21 PM  Result Value Ref Range   Alcohol, Ethyl (B) <10 <10 mg/dL    Comment: (NOTE) Lowest detectable limit for serum alcohol is 10 mg/dL.  For medical purposes only. Performed at [facility], [address], [place], [place] [address]   Protime-INR     Status: None   Collection Time: 01/06/22  6:21 PM  Result Value Ref Range   Prothrombin Time 12.6 11.4 - 15.2 seconds   INR 1.0 0.8 - 1.2    Comment: (NOTE) INR goal varies based on device and disease states. Performed at [facility], [address], [place], [place] [address]   APTT     Status: None   Collection Time: 01/06/22  6:21 PM  Result Value Ref Range   aPTT 28 24 - 36 seconds    Comment: Performed at [facility], [address], [place], [place] [address]  CBC     Status: None   Collection Time: 01/06/22  6:21 PM  Result Value Ref Range   WBC 8.3 4.0 - 10.5 K/uL   RBC 4.73 3.87 - 5.11 MIL/uL   Hemoglobin 13.1 12.0 - 15.0 g/dL   HCT 39.7 36.0 - 46.0 %   MCV 83.9 80.0 - 100.0 fL   MCH 27.7 26.0 - 34.0 pg   MCHC 33.0 30.0 - 36.0 g/dL   RDW 13.5 11.5 - 15.5 %   Platelets 389 150 - 400 K/uL   nRBC 0.0 0.0 - 0.2 %    Comment: Performed at [facility], [address] [place], [place] [address]  Differential     Status: None   Collection Time: 01/06/22  6:21 PM  Result Value Ref Range   Neutrophils Relative % 62 %   Neutro Abs 5.1 1.7 - 7.7 K/uL   Lymphocytes Relative 24 %   Lymphs Abs 2.0 0.7 - 4.0 K/uL   Monocytes Relative 9 %   Monocytes Absolute 0.7 0.1 - 1.0  K/uL   Eosinophils Relative 4 %   Eosinophils Absolute 0.3 0.0 - 0.5 K/uL   Basophils Relative 1 %   Basophils Absolute 0.1 0.0 - 0.1 K/uL   Immature Granulocytes 0 %   Abs Immature Granulocytes 0.03 0.00 - 0.07 K/uL    Comment: Performed at [facility], [address], [place], [place] [address]  Comprehensive metabolic panel     Status: Abnormal   Collection Time: 01/06/22  6:21 PM  Result Value Ref Range   Sodium 137 135 - 145 mmol/L   Potassium 3.6 3.5 - 5.1 mmol/L   Chloride 105 98 - 111 mmol/L   CO2 24 22 - 32 mmol/L   Glucose, Bld 205 (H) 70 - 99 mg/dL    Comment: Glucose reference range applies only to samples taken after fasting for at least 8 hours.   BUN 14 8 - 23 mg/dL   Creatinine, Ser 0.91 0.44 - 1.00 mg/dL   Calcium 9.0 8.9 - 10.3 mg/dL   Total Protein 7.7 6.5 - 8.1 g/dL   Albumin 3.8 3.5 - 5.0 g/dL   AST 36 15 - 41 U/L   ALT 38 0 - 44 U/L   Alkaline Phosphatase 61 38 - 126 U/L   Total Bilirubin 0.7 0.3 - 1.2 mg/dL   GFR, Estimated >60 >60 mL/min    Comment: (NOTE) Calculated using the CKD-EPI Creatinine Equation (2021)    Anion gap 8 5 - 15    Comment: Performed at [facility], [address] [place], [place] [address]  I-stat chem 8, ED     Status: Abnormal   Collection Time: 01/06/22  6:31 PM  Result Value Ref Range   Sodium 140 135 - 145 mmol/L   Potassium 3.7 3.5 - 5.1 mmol/L   Chloride 102 98 - 111 mmol/L   BUN 13 8 - 23 mg/dL   Creatinine, Ser 0.80 0.44 - 1.00 mg/dL   Glucose, Bld 205 (H) 70 - 99 mg/dL    Comment: Glucose reference range applies only to samples taken after fasting for at least 8 hours.   Calcium, Ion 1.13 (L) 1.15 - 1.40 mmol/L   TCO2 24 22 - 32 mmol/L   Hemoglobin 13.6 12.0 - 15.0 g/dL   HCT 40.0 36.0 - 46.0 %  Resp Panel by RT-PCR (Flu A&B, Covid) Anterior Nasal Swab     Status: None   Collection Time: 01/06/22  7:25 PM   Specimen: Anterior Nasal Swab  Result Value Ref Range   SARS Coronavirus 2 by RT PCR NEGATIVE NEGATIVE     Comment: (NOTE) SARS-CoV-2 target nucleic acids are NOT DETECTED.  The SARS-CoV-2 RNA is generally detectable in upper respiratory specimens during the acute phase of infection. The lowest concentration of SARS-CoV-2 viral copies this assay can detect is 138 copies/mL. A negative result does not preclude SARS-Cov-2 infection and should not be used as the sole basis for treatment or other patient management decisions. A negative result may occur with  improper specimen collection/handling, submission of specimen other than nasopharyngeal swab, presence of viral mutation(s) within the areas targeted by this assay, and inadequate number of viral copies(<138 copies/mL). A negative result must be combined with clinical observations, patient history, and epidemiological information. The expected result is Negative.  Fact Sheet for Patients:  EntrepreneurPulse.com.au  Fact Sheet for Healthcare Providers:  IncredibleEmployment.be  This test is no t yet approved or cleared by the [place] FDA and  has been authorized for detection and/or diagnosis of SARS-CoV-2 by FDA under an Emergency Use Authorization (EUA). This EUA will remain  in effect (meaning this test can be used) for the duration of the COVID-19 declaration under Section 564(b)(1) of the Act, 21 U.S.C.section 360bbb-3(b)(1), unless the authorization is terminated  or revoked sooner.       Influenza A by PCR NEGATIVE NEGATIVE   Influenza B by PCR NEGATIVE NEGATIVE    Comment: (NOTE) The Xpert Xpress SARS-CoV-2/FLU/RSV plus  assay is intended as an aid in the diagnosis of influenza from Nasopharyngeal swab specimens and should not be used as a sole basis for treatment. Nasal washings and aspirates are unacceptable for Xpert Xpress SARS-CoV-2/FLU/RSV testing.  Fact Sheet for Patients: EntrepreneurPulse.com.au  Fact Sheet for Healthcare  Providers: IncredibleEmployment.be  This test is not yet approved or cleared by the [place] FDA and has been authorized for detection and/or diagnosis of SARS-CoV-2 by FDA under an Emergency Use Authorization (EUA). This EUA will remain in effect (meaning this test can be used) for the duration of the COVID-19 declaration under Section 564(b)(1) of the Act, 21 U.S.C. section 360bbb-3(b)(1), unless the authorization is terminated or revoked.  Performed at [facility], [address] [place], [place] [address]   Urine rapid drug screen (hosp performed)     Status: None   Collection Time: 01/06/22  7:27 PM  Result Value Ref Range   Opiates NONE DETECTED NONE DETECTED   Cocaine NONE DETECTED NONE DETECTED   Benzodiazepines NONE DETECTED NONE DETECTED   Amphetamines NONE DETECTED NONE DETECTED   Tetrahydrocannabinol NONE DETECTED NONE DETECTED   Barbiturates NONE DETECTED NONE DETECTED    Comment: (NOTE) DRUG SCREEN FOR MEDICAL PURPOSES ONLY.  IF CONFIRMATION IS NEEDED FOR ANY PURPOSE, NOTIFY LAB WITHIN 5 DAYS.  LOWEST DETECTABLE LIMITS FOR URINE DRUG SCREEN Drug Class                     Cutoff (ng/mL) Amphetamine and metabolites    1000 Barbiturate and metabolites    200 Benzodiazepine                 277 Tricyclics and metabolites     300 Opiates and metabolites        300 Cocaine and metabolites        300 THC                            50 Performed at [facility], [address] [place], [place] [address]   Urinalysis, Routine w reflex microscopic Urine, Clean Catch     Status: None   Collection Time: 01/06/22  7:27 PM  Result Value Ref Range   Color, Urine YELLOW YELLOW   APPearance CLEAR CLEAR   Specific Gravity, Urine 1.005 1.005 - 1.030   pH 6.0 5.0 - 8.0   Glucose, UA NEGATIVE NEGATIVE mg/dL   Hgb urine dipstick NEGATIVE NEGATIVE   Bilirubin Urine NEGATIVE NEGATIVE   Ketones, ur NEGATIVE NEGATIVE mg/dL   Protein, ur NEGATIVE NEGATIVE  mg/dL   Nitrite NEGATIVE NEGATIVE   Leukocytes,Ua NEGATIVE NEGATIVE    Comment: Performed at [facility], [address], [place], [place] [address]   CT HEAD CODE STROKE WO CONTRAST  Result Date: 01/06/2022 CLINICAL DATA:  Neuro deficit, acute, stroke suspected EXAM: CT HEAD WITHOUT CONTRAST TECHNIQUE: Contiguous axial images were obtained from the base of the skull through the vertex without intravenous contrast. RADIATION DOSE REDUCTION: This exam was performed according to the departmental dose-optimization program which includes automated exposure control, adjustment of the mA and/or kV according to patient size and/or use of iterative reconstruction technique. COMPARISON:  04/10/2021 FINDINGS: Brain: There is no acute intracranial hemorrhage, mass effect, or edema. No new loss of gray-white differentiation. Ventricles and sulci are stable in size and configuration. Patchy hypoattenuation in the supratentorial white matter is nonspecific but may reflect similar mild chronic microvascular ischemic changes. There  is no extra-axial collection. Vascular: No hyperdense vessel. Skull: Unremarkable. Sinuses/Orbits: No acute finding. Other: Mastoid air cells are clear. ASPECTS ([place] Stroke Program Early CT Score) - Ganglionic level infarction (caudate, lentiform nuclei, internal capsule, insula, M1-M3 cortex): 7 - Supraganglionic infarction (M4-M6 cortex): 3 Total score (0-10 with 10 being normal): 10 IMPRESSION: There is no acute intracranial hemorrhage or evidence of acute infarction. Mild chronic microvascular ischemic changes. Electronically Signed   By: Macy Mis M.D.   On: 01/06/2022 18:10     Assessment:  66 year old woman with multiple vascular risk factors presenting with likely small vessel stroke in the right hemisphere. She was administered TNK at the [facility] ED. She has been transferred to [facility] for post-TNK monitoring and stroke work up.  - Follow up exam at [facility] reveals NIHSS of 4 with  subjective complete left sided sensory loss and bobbing drift on the left. DDx includes ischemic stroke, conversion disorder and secondary gain.  - CT head at [facility]: No acute intracranial hemorrhage or evidence of acute infarction. Mild chronic microvascular ischemic changes.  Plan: - Stroke labs HgbA1c, fasting lipid panel - MRI brain  - Repeat CT head 24 hours post-TNK - Frequent neuro checks - Echocardiogram - Hold antiplatelets until post TNK imaging confirms no significant hemorrhagic transformation - Risk factor modification, diet, exercise and weight loss counseling - Telemetry monitoring - Blood pressure goal              - Post tPA for 24  hours < 180/105, clevidipine and labetalol ordered - PT consult, OT consult, Speech consult, unless patient returns fully to baseline - CTA of head and neck - SSI  Addendum:: MRI brain: 1.2 cm acute ischemic nonhemorrhagic right thalamocapsular infarct. Underlying mild to moderate chronic microvascular ischemic disease.  Electronically signed: Dr. Kerney Elbe 01/06/2022, 9:00 PM

## 2022-01-06 NOTE — ED Notes (Signed)
Pt talking non stop at this time and states she is anxious. Pt mother at bedside. Pt states she needs to pee but unable to urinate at this time with purwick in plac.e

## 2022-01-06 NOTE — ED Notes (Signed)
Pt arrived via carelink from [facility] ED. Bedside report from [place]. Pt A&Ox4, weakness and sensation changes to left extremities.

## 2022-01-06 NOTE — ED Notes (Signed)
Teleneuro aware of BP 183/84, 20 mg Labetatol given in right arm iv.

## 2022-01-06 NOTE — ED Notes (Signed)
Pt transported to MRI 

## 2022-01-07 ENCOUNTER — Inpatient Hospital Stay (HOSPITAL_COMMUNITY): Payer: Medicare HMO

## 2022-01-07 DIAGNOSIS — Z8673 Personal history of transient ischemic attack (TIA), and cerebral infarction without residual deficits: Secondary | ICD-10-CM

## 2022-01-07 DIAGNOSIS — I63331 Cerebral infarction due to thrombosis of right posterior cerebral artery: Secondary | ICD-10-CM | POA: Diagnosis not present

## 2022-01-07 DIAGNOSIS — I639 Cerebral infarction, unspecified: Secondary | ICD-10-CM | POA: Diagnosis not present

## 2022-01-07 DIAGNOSIS — I10 Essential (primary) hypertension: Secondary | ICD-10-CM

## 2022-01-07 DIAGNOSIS — I6389 Other cerebral infarction: Secondary | ICD-10-CM

## 2022-01-07 DIAGNOSIS — E782 Mixed hyperlipidemia: Secondary | ICD-10-CM

## 2022-01-07 DIAGNOSIS — E11649 Type 2 diabetes mellitus with hypoglycemia without coma: Secondary | ICD-10-CM

## 2022-01-07 LAB — ECHOCARDIOGRAM COMPLETE
AR max vel: 2.8 cm2
AV Area VTI: 2.65 cm2
AV Area mean vel: 2.43 cm2
AV Mean grad: 4 mmHg
AV Peak grad: 6.6 mmHg
Ao pk vel: 1.28 m/s
Area-P 1/2: 7.22 cm2
S' Lateral: 2.5 cm
Weight: 3689.62 oz

## 2022-01-07 LAB — GLUCOSE, CAPILLARY
Glucose-Capillary: 142 mg/dL — ABNORMAL HIGH (ref 70–99)
Glucose-Capillary: 176 mg/dL — ABNORMAL HIGH (ref 70–99)
Glucose-Capillary: 189 mg/dL — ABNORMAL HIGH (ref 70–99)
Glucose-Capillary: 148 mg/dL — ABNORMAL HIGH (ref 70–99)
Glucose-Capillary: 136 mg/dL — ABNORMAL HIGH (ref 70–99)

## 2022-01-07 LAB — HEMOGLOBIN A1C
Hgb A1c MFr Bld: 7.1 % — ABNORMAL HIGH (ref 4.8–5.6)
Mean Plasma Glucose: 157.07 mg/dL

## 2022-01-07 LAB — LIPID PANEL
Cholesterol: 156 mg/dL (ref 0–200)
HDL: 53 mg/dL (ref >40)
LDL Cholesterol: 82 mg/dL (ref 0–99)
Total CHOL/HDL Ratio: 2.9 RATIO
Triglycerides: 107 mg/dL (ref <150)
VLDL: 21 mg/dL (ref 0–40)

## 2022-01-07 LAB — MRSA NEXT GEN BY PCR, NASAL: MRSA by PCR Next Gen: NOT DETECTED

## 2022-01-07 MED ORDER — POTASSIUM 99 MG PO TABS: 99.0000 mg | ORAL_TABLET | Freq: Every day | ORAL | Status: DC

## 2022-01-07 MED ORDER — FUROSEMIDE 20 MG PO TABS
20.0000 mg | ORAL_TABLET | Freq: Every morning | ORAL | Status: DC
  Administered 2022-01-07 – 2022-01-14 (×8): 20 mg via ORAL
  Filled 2022-01-07 (×8): qty 1

## 2022-01-07 MED ORDER — TRAZODONE HCL 100 MG PO TABS
100.0000 mg | ORAL_TABLET | Freq: Every evening | ORAL | Status: DC | PRN
Start: 2022-01-07 — End: 2022-01-14
  Administered 2022-01-10 – 2022-01-13 (×4): 100 mg via ORAL
  Filled 2022-01-07 (×4): qty 1

## 2022-01-07 MED ORDER — CHLORHEXIDINE GLUCONATE CLOTH 2 % EX PADS
6.0000 | MEDICATED_PAD | Freq: Every day | CUTANEOUS | Status: DC
  Administered 2022-01-07: 6 via TOPICAL

## 2022-01-07 MED ORDER — SIMVASTATIN 20 MG PO TABS
40.0000 mg | ORAL_TABLET | Freq: Every evening | ORAL | Status: DC
Start: 2022-01-07 — End: 2022-01-07
  Filled 2022-01-07: qty 2

## 2022-01-07 MED ORDER — ROSUVASTATIN CALCIUM 20 MG PO TABS
20.0000 mg | ORAL_TABLET | Freq: Every day | ORAL | Status: DC
  Administered 2022-01-08 – 2022-01-14 (×7): 20 mg via ORAL
  Filled 2022-01-07 (×7): qty 1

## 2022-01-07 MED ORDER — BUSPIRONE HCL 10 MG PO TABS
5.0000 mg | ORAL_TABLET | Freq: Two times a day (BID) | ORAL | Status: DC
  Administered 2022-01-07 – 2022-01-14 (×16): 5 mg via ORAL
  Filled 2022-01-07 (×16): qty 1

## 2022-01-07 MED ORDER — INSULIN ASPART 100 UNIT/ML IJ SOLN
0.0000 [IU] | Freq: Three times a day (TID) | INTRAMUSCULAR | Status: DC
  Administered 2022-01-07: 3 [IU] via SUBCUTANEOUS
  Administered 2022-01-07 – 2022-01-08 (×4): 4 [IU] via SUBCUTANEOUS
  Administered 2022-01-09: 3 [IU] via SUBCUTANEOUS
  Administered 2022-01-09 (×2): 4 [IU] via SUBCUTANEOUS
  Administered 2022-01-10: 3 [IU] via SUBCUTANEOUS
  Administered 2022-01-10 – 2022-01-11 (×3): 4 [IU] via SUBCUTANEOUS
  Administered 2022-01-11: 7 [IU] via SUBCUTANEOUS
  Administered 2022-01-11 – 2022-01-12 (×2): 4 [IU] via SUBCUTANEOUS
  Administered 2022-01-12: 11 [IU] via SUBCUTANEOUS
  Administered 2022-01-12: 3 [IU] via SUBCUTANEOUS
  Administered 2022-01-13: 7 [IU] via SUBCUTANEOUS
  Administered 2022-01-13 (×2): 4 [IU] via SUBCUTANEOUS
  Administered 2022-01-14: 11 [IU] via SUBCUTANEOUS
  Administered 2022-01-14: 7 [IU] via SUBCUTANEOUS
  Administered 2022-01-14: 4 [IU] via SUBCUTANEOUS

## 2022-01-07 MED ORDER — LISINOPRIL 20 MG PO TABS: 20.0000 mg | ORAL_TABLET | Freq: Every day | ORAL | Status: DC

## 2022-01-07 NOTE — Evaluation (Signed)
Speech Language Pathology Evaluation Patient Details Name: Shelby Morgan MRN: [id number] DOB: 1955-12-14 Today's Date: 01/07/2022 Time: 4650-3546 SLP Time Calculation (min) (ACUTE ONLY): 18 min  Problem List:  Patient Active Problem List   Diagnosis Date Noted   Stroke (cerebrum) ([place]) 01/06/2022   CVA (cerebral vascular accident) ([place]) 04/10/2021   History of melanoma 06/20/2018   Chest pain 06/06/2018   Hypertension associated with diabetes ([place]) 06/06/2018   Hyperlipidemia associated with type 2 diabetes mellitus ([place]) 06/06/2018   Mediastinal adenopathy 06/06/2018   Elevated LFTs 05/02/2012   Lactic acidosis 05/02/2012   Cellulitis of right leg 05/01/2012   Diabetes mellitus type 2 in obese ([place]) 05/01/2012   Hypokalemia 05/01/2012   Hyponatremia 05/01/2012   NAFLD (nonalcoholic fatty liver disease) 05/14/2011   Obesity 05/14/2011   Hemorrhoids 05/14/2011   Transaminitis 01/29/2011   Hepatomegaly 01/29/2011   Rectal bleed 01/29/2011   Dysphagia 01/29/2011   RUQ pain 01/29/2011   Hypercalcemia 01/29/2011   Anxiety 01/29/2011   GERD (gastroesophageal reflux disease) 01/29/2011   Past Medical History:  Past Medical History:  Diagnosis Date   Anxiety    Bell's palsy 01/08/2011   Chronic back pain    Depression    Diabetes mellitus    GERD (gastroesophageal reflux disease)    Hepatomegaly    Hypertension    Melanoma ([place]) 2009   Dr Nevada Crane, Stage 2, required only surgery   NAFLD (nonalcoholic fatty liver disease)    Skin cancer    Stroke ([place]) 07/2021   Past Surgical History:  Past Surgical History:  Procedure Laterality Date   CESAREAN SECTION     COLONOSCOPY  03/09/2011   Dr Oneida Alar diverticulosis, internal and external hemorrhoids   ESOPHAGOGASTRODUODENOSCOPY  03/09/2011   Esophageal stricture dilated to 16 MM savory, NSAID- induced gastritis and duodenitis   LEG SURGERY     plates/screws/hx fx-right leg   MELANOMA EXCISION     right knee   SAVORY  DILATION  03/09/2011   HPI:  Pt is a 66 y/o female who presented to the ED with 6/13 with left-sided numbness and weakness. TNK given. MRI 6/13: 1.2 cm acute ischemic nonhemorrhagic right thalamocapsular infarct. PMHx Bells palsy, HLD, DM, HTN, melanoma, Left thalamic stroke 2022, NAFLD   Assessment / Plan / Recommendation Clinical Impression  Pt participated in speech-language-cognition evaluation. Pt denied any baseline deficits in speech, language, or cognition. Speech and language skills were WNL. [facility] Mental Status Examination was completed to evaluate the pt's cognitive-linguistic skills. She achieved a score of 24/30 which is below the normal limits of 27 or more out of 30 and is suggestive of a mild impairment. She exhibited difficulty in the areas of memory, and executive function. Pt questioned whether her performance today was representative of her baseline. Considering her level of independence prior to admission, she questioned whether some tasks may have been more difficult than anticipated. Skilled SLP services are clinically indicated at this time to improve cognitive-linguistic function.    SLP Assessment  SLP Recommendation/Assessment: Patient needs continued Speech [place] [facility] SLP Visit Diagnosis: Cognitive communication deficit (R41.841)    Recommendations for follow up therapy are one component of a multi-disciplinary discharge planning process, led by the attending physician.  Recommendations may be updated based on patient status, additional functional criteria and insurance authorization.    Follow Up Recommendations  Acute inpatient rehab (3hours/day)    Assistance Recommended at Discharge  Intermittent Supervision/Assistance  Functional Status Assessment Patient has had  a recent decline in their functional status and demonstrates the ability to make significant improvements in function in a reasonable and predictable amount of time.   Frequency and Duration min 2x/week  2 weeks      SLP Evaluation Cognition  Overall Cognitive Status: Impaired/Different from baseline Arousal/Alertness: Awake/alert Orientation Level: Oriented X4 Year: 2023 Month: June Day of Week: Correct Attention: Focused;Sustained Focused Attention: Appears intact Sustained Attention: Appears intact Memory: Impaired Memory Impairment:  (Immediate: 5/5; delayed: 5/5; paragraph: 6/8) Awareness: Appears intact Problem Solving: Impaired Problem Solving Impairment: Verbal complex (Money: 1/3; time: 1/1) Executive Function: Sequencing;Organizing Sequencing: Impaired Sequencing Impairment: Verbal complex (clock drawing: 2/4 with self-correction) Organizing: Impaired Organizing Impairment: Verbal complex (backward digit span: 1/2)       Comprehension  Auditory Comprehension Overall Auditory Comprehension: Appears within functional limits for tasks assessed Yes/No Questions: Within Functional Limits Commands: Within Functional Limits Conversation: Complex    Expression Expression Primary Mode of Expression: Verbal Verbal Expression Overall Verbal Expression: Appears within functional limits for tasks assessed Initiation: No impairment Automatic Speech: Name Repetition: No impairment Naming: No impairment Pragmatics: No impairment Written Expression Dominant Hand: Right   Oral / Motor  Oral Motor/Sensory Function Overall Oral Motor/Sensory Function: Within functional limits Motor Speech Overall Motor Speech: Appears within functional limits for tasks assessed Respiration: Within functional limits Phonation: Normal Resonance: Within functional limits Articulation: Within functional limitis Intelligibility: Intelligible Motor Planning: Witnin functional limits           Khira Cudmore I. Hardin Negus, [place], [place] Office number [phone] Pager [place] 01/07/2022, 3:10 PM

## 2022-01-07 NOTE — Progress Notes (Signed)
Echocardiogram 2D Echocardiogram has been performed.  Shelby Morgan 01/07/2022, 9:00 AM

## 2022-01-07 NOTE — Evaluation (Signed)
Physical Therapy Evaluation Patient Details Name: Shelby Morgan MRN: [id number] DOB: [birth year] Today's Date: 01/07/2022  History of Present Illness  pt is a 66 y/o female admitted 6/13 with Left sided numbness and weakness.  MRI shows a 1.2 cm acute right thalamocapsular infarct--non hemorrhagic.Marland Kitchen  PMHx Bells palsy, HLD, DM, HTN, melanoma, Left thalamic stroke 2022, NAFLD  Clinical Impression  Pt admitted with/for signs of stroke confirmed by MRI.  Pt needing min to moderate assist for basic mobility, likely will need 2 person assist during gait training due to severely limited sensation on the L side..  Pt currently limited functionally due to the problems listed. ( See problems list.)   Pt will benefit from PT to maximize function and safety in order to get ready for next venue listed below.        Recommendations for follow up therapy are one component of a multi-disciplinary discharge planning process, led by the attending physician.  Recommendations may be updated based on patient status, additional functional criteria and insurance authorization.  Follow Up Recommendations Acute inpatient rehab (3hours/day)    Assistance Recommended at Discharge Frequent or constant Supervision/Assistance  Patient can return home with the following  A little help with walking and/or transfers;A little help with bathing/dressing/bathroom;Assistance with cooking/housework;Direct supervision/assist for medications management;Direct supervision/assist for financial management;Assist for transportation;Help with stairs or ramp for entrance    Equipment Recommendations    Recommendations for Other Services  Rehab consult    Functional Status Assessment Patient has had a recent decline in their functional status and demonstrates the ability to make significant improvements in function in a reasonable and predictable amount of time.     Precautions / Restrictions Precautions Precautions: Fall Precaution  Comments: BP parameters 180/105 Restrictions Weight Bearing Restrictions: No      Mobility  Bed Mobility Overal bed mobility: Needs Assistance Bed Mobility: Supine to Sit, Sit to Supine     Supine to sit: Min assist Sit to supine: Mod assist   General bed mobility comments: min assist to elevated trunk and sequence to EOB, BL E support and cueing for sequencing once returned to supine    Transfers Overall transfer level: Needs assistance Equipment used: 2 person hand held assist Transfers: Sit to/from Stand Sit to Stand: Mod assist, Min assist, +2 physical assistance, +2 safety/equipment           General transfer comment: min assist +2 to power up and steady from EOB, dynamically at EOB requires mod assist +2    Ambulation/Gait               General Gait Details: NT today  Stairs            Wheelchair Mobility    Modified Rankin (Stroke Patients Only) Modified Rankin (Stroke Patients Only) Pre-Morbid Rankin Score: Slight disability Modified Rankin: Severe disability     Balance Overall balance assessment: Needs assistance Sitting-balance support: No upper extremity supported, Feet supported Sitting balance-Leahy Scale: Fair Sitting balance - Comments: min assist to min guard dynamically for safety   Standing balance support: Bilateral upper extremity supported, During functional activity Standing balance-Leahy Scale: Poor Standing balance comment: relies on BUE and external support                             Pertinent Vitals/Pain Pain Assessment Pain Assessment: No/denies pain    Home Living Family/patient expects to be discharged to:: Private residence Living Arrangements: Alone  Available Help at Discharge: Family Type of Home: House Home Access: Stairs to enter Entrance Stairs-Rails: Can reach both Entrance Stairs-Number of Steps: 7   Home Layout: One level Home Equipment: Grab bars - tub/shower;Rolling Walker (2  wheels);Cane - single point      Prior Function Prior Level of Function : Independent/Modified Independent;Driving             Mobility Comments: no AD ADLs Comments: independent     Hand Dominance   Dominant Hand: Right    Extremity/Trunk Assessment   Upper Extremity Assessment Upper Extremity Assessment: Defer to OT evaluation RUE Deficits / Details: hx of CVA with weakness 3/5 MMT, slow movements but coordiated RUE Sensation: decreased light touch RUE Coordination: WNL LUE Deficits / Details: grosly 3-/5 MMT, poor coordination, proprioception and no sensation. pt using functionally during ADL tasks, but pt unable to control UE at times mobility tasks LUE Sensation: decreased light touch;decreased proprioception LUE Coordination: decreased fine motor;decreased gross motor    Lower Extremity Assessment Lower Extremity Assessment: LLE deficits/detail;RLE deficits/detail RLE Deficits / Details: functional, but mild weakness, pt able to isolate movements LLE Deficits / Details: grossly weak at 3/5, uncoodinated with absence of LT/pressure and pain. LLE Sensation: decreased light touch;decreased proprioception LLE Coordination: decreased fine motor;decreased gross motor    Cervical / Trunk Assessment Cervical / Trunk Assessment: Other exceptions Cervical / Trunk Exceptions: increased body habitus  Communication   Communication: No difficulties  Cognition Arousal/Alertness: Awake/alert Behavior During Therapy: Impulsive Overall Cognitive Status: Impaired/Different from baseline Area of Impairment: Following commands, Attention, Memory, Safety/judgement, Awareness, Problem solving                   Current Attention Level: Sustained Memory: Decreased short-term memory Following Commands: Follows one step commands consistently, Follows one step commands with increased time, Follows multi-step commands inconsistently Safety/Judgement: Decreased awareness of safety,  Decreased awareness of deficits Awareness: Emergent Problem Solving: Slow processing, Requires verbal cues, Difficulty sequencing, Requires tactile cues General Comments: pt fearful of falling, slightly impulsive with difficulty following mulitple step commands and sequencing functionally        General Comments General comments (skin integrity, edema, etc.): vss    Exercises     Assessment/Plan    PT Assessment Patient needs continued PT services  PT Problem List Decreased strength;Decreased activity tolerance;Decreased balance;Decreased mobility;Decreased coordination       PT Treatment Interventions DME instruction;Gait training;Functional mobility training;Therapeutic activities;Balance training;Neuromuscular re-education;Patient/family education    PT Goals (Current goals can be found in the Care Plan section)  Acute Rehab PT Goals Patient Stated Goal: back to PLOF, living by myself, driving. PT Goal Formulation: With patient Time For Goal Achievement: 01/21/22 Potential to Achieve Goals: Good    Frequency Min 4X/week     Co-evaluation PT/OT/SLP Co-Evaluation/Treatment: Yes Reason for Co-Treatment: For patient/therapist safety PT goals addressed during session: Mobility/safety with mobility OT goals addressed during session: ADL's and self-care       AM-PAC PT "6 Clicks" Mobility  Outcome Measure Help needed turning from your back to your side while in a flat bed without using bedrails?: A Lot Help needed moving from lying on your back to sitting on the side of a flat bed without using bedrails?: A Lot Help needed moving to and from a bed to a chair (including a wheelchair)?: A Lot Help needed standing up from a chair using your arms (e.g., wheelchair or bedside chair)?: A Lot Help needed to walk in hospital room?: Total  Help needed climbing 3-5 steps with a railing? : Total 6 Click Score: 10    End of Session   Activity Tolerance: Patient tolerated treatment  well Patient left: in bed Nurse Communication: Mobility status PT Visit Diagnosis: Other abnormalities of gait and mobility (R26.89);Hemiplegia and hemiparesis;Other symptoms and signs involving the nervous system (R29.898) Hemiplegia - Right/Left: Left Hemiplegia - dominant/non-dominant: Non-dominant Hemiplegia - caused by: Cerebral infarction    Time: 0981-1914 PT Time Calculation (min) (ACUTE ONLY): 34 min   Charges:   PT Evaluation $PT Eval Moderate Complexity: 1 Mod          01/07/2022  [name], PT Acute Rehabilitation Services [phone]  (pager) [phone]  (office)  Tessie Fass Wallis Vancott 01/07/2022, 2:06 PM

## 2022-01-07 NOTE — Progress Notes (Signed)
STROKE TEAM PROGRESS NOTE   SUBJECTIVE (INTERVAL HISTORY) Her Mom and brother are at the bedside.  Overall her condition is gradually improving.  Patient still has mild left hemiparesis, however improving.  BP improved, off Cleviprex.  Pending CTA head and neck and 24-hour CT.   OBJECTIVE Temp:  [97.8 F (36.6 C)-98.9 F (37.2 C)] 98.2 F (36.8 C) (06/14 1204) Pulse Rate:  [73-103] 77 (06/14 1110) Cardiac Rhythm: Normal sinus rhythm (06/14 0800) Resp:  [13-34] 20 (06/14 1110) BP: (117-194)/(55-148) 160/87 (06/14 1110) SpO2:  [90 %-100 %] 96 % (06/14 1110) Weight:  [104.6 kg] 104.6 kg (06/14 0016)  Recent Labs  Lab 01/06/22 1750 01/06/22 2358 01/07/22 0405 01/07/22 0730 01/07/22 1201  GLUCAP 192* 138* 142* 176* 189*   Recent Labs  Lab 01/06/22 1821 01/06/22 1831  NA 137 140  K 3.6 3.7  CL 105 102  CO2 24  --   GLUCOSE 205* 205*  BUN 14 13  CREATININE 0.91 0.80  CALCIUM 9.0  --    Recent Labs  Lab 01/06/22 1821  AST 36  ALT 38  ALKPHOS 61  BILITOT 0.7  PROT 7.7  ALBUMIN 3.8   Recent Labs  Lab 01/06/22 1821 01/06/22 1831  WBC 8.3  --   NEUTROABS 5.1  --   HGB 13.1 13.6  HCT 39.7 40.0  MCV 83.9  --   PLT 389  --    No results for input(s): "CKTOTAL", "CKMB", "CKMBINDEX", "TROPONINI" in the last 168 hours. Recent Labs    01/06/22 1821  LABPROT 12.6  INR 1.0   Recent Labs    01/06/22 1927  COLORURINE YELLOW  LABSPEC 1.005  PHURINE 6.0  GLUCOSEU NEGATIVE  HGBUR NEGATIVE  BILIRUBINUR NEGATIVE  KETONESUR NEGATIVE  PROTEINUR NEGATIVE  NITRITE NEGATIVE  LEUKOCYTESUR NEGATIVE       Component Value Date/Time   CHOL 156 01/07/2022 0418   TRIG 107 01/07/2022 0418   HDL 53 01/07/2022 0418   CHOLHDL 2.9 01/07/2022 0418   VLDL 21 01/07/2022 0418   LDLCALC 82 01/07/2022 0418   Lab Results  Component Value Date   HGBA1C 7.1 (H) 01/07/2022      Component Value Date/Time   LABOPIA NONE DETECTED 01/06/2022 1927   COCAINSCRNUR NONE DETECTED  01/06/2022 1927   LABBENZ NONE DETECTED 01/06/2022 1927   AMPHETMU NONE DETECTED 01/06/2022 1927   THCU NONE DETECTED 01/06/2022 1927   LABBARB NONE DETECTED 01/06/2022 1927    Recent Labs  Lab 01/06/22 1821  ETH <10    I have personally reviewed the radiological images below and agree with the radiology interpretations.  ECHOCARDIOGRAM COMPLETE  Result Date: 01/07/2022    ECHOCARDIOGRAM REPORT   Patient Name:   KELLY RANIERI Date of Exam: 01/07/2022 Medical Rec #:  [id number]      Height:       62.0 in Accession #:    [id number]     Weight:       230.6 lb Date of Birth:  [birth year]      BSA:          2.031 m Patient Age:    66 years       BP:           161/93 mmHg Patient Gender: F              HR:           82 bpm. Exam Location:  Inpatient Procedure: 2D Echo, Cardiac Doppler and Color  Doppler Indications:    Stroke  History:        Patient has prior history of Echocardiogram examinations, most                 recent 04/11/2021. Risk Factors:Hypertension, Diabetes and HLD.  Sonographer:    Joette Catching RCS Referring Phys: [place]  1. Left ventricular ejection fraction, by estimation, is 60 to 65%. The left ventricle has normal function. The left ventricle has no regional wall motion abnormalities. There is mild left ventricular hypertrophy. Left ventricular diastolic parameters are indeterminate.  2. Right ventricular systolic function is normal. The right ventricular size is normal.  3. Trivial mitral valve regurgitation.  4. The aortic valve is tricuspid. Aortic valve regurgitation is not visualized. Aortic valve sclerosis is present, with no evidence of aortic valve stenosis.  5. The inferior vena cava is normal in size with greater than 50% respiratory variability, suggesting right atrial pressure of 3 mmHg. Comparison(s): The left ventricular function is unchanged. FINDINGS  Left Ventricle: Left ventricular ejection fraction, by estimation, is 60 to 65%. The left  ventricle has normal function. The left ventricle has no regional wall motion abnormalities. The left ventricular internal cavity size was normal in size. There is  mild left ventricular hypertrophy. Left ventricular diastolic parameters are indeterminate. Right Ventricle: The right ventricular size is normal. Right vetricular wall thickness was not assessed. Right ventricular systolic function is normal. Left Atrium: Left atrial size was normal in size. Right Atrium: Right atrial size was normal in size. Pericardium: There is no evidence of pericardial effusion. Mitral Valve: There is mild thickening of the mitral valve leaflet(s). There is mild calcification of the mitral valve leaflet(s). Trivial mitral valve regurgitation. Tricuspid Valve: The tricuspid valve is normal in structure. Tricuspid valve regurgitation is trivial. Aortic Valve: The aortic valve is tricuspid. Aortic valve regurgitation is not visualized. Aortic valve sclerosis is present, with no evidence of aortic valve stenosis. Aortic valve mean gradient measures 4.0 mmHg. Aortic valve peak gradient measures 6.6  mmHg. Aortic valve area, by VTI measures 2.65 cm. Pulmonic Valve: The pulmonic valve was normal in structure. Pulmonic valve regurgitation is not visualized. Aorta: The aortic root and ascending aorta are structurally normal, with no evidence of dilitation. Venous: The inferior vena cava is normal in size with greater than 50% respiratory variability, suggesting right atrial pressure of 3 mmHg. IAS/Shunts: No atrial level shunt detected by color flow Doppler.  LEFT VENTRICLE PLAX 2D LVIDd:         3.80 cm   Diastology LVIDs:         2.50 cm   LV e' medial:    6.96 cm/s LV PW:         0.70 cm   LV E/e' medial:  10.4 LV IVS:        1.30 cm   LV e' lateral:   9.25 cm/s LVOT diam:     2.00 cm   LV E/e' lateral: 7.8 LV SV:         70 LV SV Index:   34 LVOT Area:     3.14 cm  RIGHT VENTRICLE             IVC RV Basal diam:  2.30 cm     IVC diam:  1.40 cm RV Mid diam:    2.50 cm RV S prime:     18.70 cm/s TAPSE (M-mode): 2.4 cm LEFT ATRIUM  Index        RIGHT ATRIUM           Index LA diam:        3.00 cm 1.48 cm/m   RA Area:     11.40 cm LA Vol (A2C):   33.4 ml 16.45 ml/m  RA Volume:   19.60 ml  9.65 ml/m LA Vol (A4C):   39.3 ml 19.35 ml/m LA Biplane Vol: 36.5 ml 17.97 ml/m  AORTIC VALVE                    PULMONIC VALVE AV Area (Vmax):    2.80 cm     PV Vmax:       0.90 m/s AV Area (Vmean):   2.43 cm     PV Peak grad:  3.2 mmHg AV Area (VTI):     2.65 cm AV Vmax:           128.00 cm/s AV Vmean:          93.800 cm/s AV VTI:            0.264 m AV Peak Grad:      6.6 mmHg AV Mean Grad:      4.0 mmHg LVOT Vmax:         114.00 cm/s LVOT Vmean:        72.600 cm/s LVOT VTI:          0.223 m LVOT/AV VTI ratio: 0.84  AORTA Ao Root diam: 3.40 cm Ao Asc diam:  3.80 cm MITRAL VALVE                TRICUSPID VALVE MV Area (PHT): 7.22 cm     TR Peak grad:   26.0 mmHg MV Decel Time: 105 msec     TR Vmax:        255.00 cm/s MV E velocity: 72.40 cm/s MV A velocity: 114.00 cm/s  SHUNTS MV E/A ratio:  0.64         Systemic VTI:  0.22 m                             Systemic Diam: 2.00 cm Dorris Carnes MD Electronically signed by Dorris Carnes MD Signature Date/Time: 01/07/2022/11:57:51 AM    Final    MR BRAIN WO CONTRAST  Result Date: 01/07/2022 CLINICAL DATA:  Follow-up examination for acute stroke. EXAM: MRI HEAD WITHOUT CONTRAST TECHNIQUE: Multiplanar, multiecho pulse sequences of the brain and surrounding structures were obtained without intravenous contrast. COMPARISON:  Prior CTs from earlier the same day. FINDINGS: Brain: Cerebral volume within normal limits. Scattered patchy T2/FLAIR hyperintensity involving the periventricular and deep white matter both cerebral hemispheres, most consistent with chronic small vessel ischemic disease, mild to moderate in nature. 1.2 cm acute ischemic nonhemorrhagic infarcts seen involving the right thalamocapsular  region (series 5, image 74). No significant mass effect. No other evidence for acute or subacute ischemia. Gray-white matter differentiation otherwise maintained. No areas of chronic cortical infarction. No acute or chronic intracranial blood products. No mass lesion, mass effect or midline shift. No hydrocephalus or extra-axial fluid collection. Pituitary gland suprasellar region within normal limits. Midline structures intact and normally formed. Vascular: Major intracranial vascular flow voids are maintained. Skull and upper cervical spine: Craniocervical junction within normal limits. Bone marrow signal intensity normal. Partial segmental fusion of the C3 and C4 vertebral bodies noted. No scalp soft tissue abnormality. Sinuses/Orbits: Prior bilateral ocular  lens replacement. Paranasal sinuses are largely clear. No mastoid effusion. Other: None. IMPRESSION: 1. 1.2 cm acute ischemic nonhemorrhagic right thalamocapsular infarct. 2. Underlying mild to moderate chronic microvascular ischemic disease. Electronically Signed   By: Jeannine Boga M.D.   On: 01/07/2022 00:20   CT HEAD CODE STROKE WO CONTRAST  Result Date: 01/06/2022 CLINICAL DATA:  Neuro deficit, acute, stroke suspected EXAM: CT HEAD WITHOUT CONTRAST TECHNIQUE: Contiguous axial images were obtained from the base of the skull through the vertex without intravenous contrast. RADIATION DOSE REDUCTION: This exam was performed according to the departmental dose-optimization program which includes automated exposure control, adjustment of the mA and/or kV according to patient size and/or use of iterative reconstruction technique. COMPARISON:  04/10/2021 FINDINGS: Brain: There is no acute intracranial hemorrhage, mass effect, or edema. No new loss of gray-white differentiation. Ventricles and sulci are stable in size and configuration. Patchy hypoattenuation in the supratentorial white matter is nonspecific but may reflect similar mild chronic  microvascular ischemic changes. There is no extra-axial collection. Vascular: No hyperdense vessel. Skull: Unremarkable. Sinuses/Orbits: No acute finding. Other: Mastoid air cells are clear. ASPECTS ([place] Stroke Program Early CT Score) - Ganglionic level infarction (caudate, lentiform nuclei, internal capsule, insula, M1-M3 cortex): 7 - Supraganglionic infarction (M4-M6 cortex): 3 Total score (0-10 with 10 being normal): 10 IMPRESSION: There is no acute intracranial hemorrhage or evidence of acute infarction. Mild chronic microvascular ischemic changes. Electronically Signed   By: Macy Mis M.D.   On: 01/06/2022 18:10     PHYSICAL EXAM  Temp:  [97.8 F (36.6 C)-98.9 F (37.2 C)] 98.2 F (36.8 C) (06/14 1204) Pulse Rate:  [73-103] 77 (06/14 1110) Resp:  [13-34] 20 (06/14 1110) BP: (117-194)/(55-148) 160/87 (06/14 1110) SpO2:  [90 %-100 %] 96 % (06/14 1110) Weight:  [104.6 kg] 104.6 kg (06/14 0016)  General - Well nourished, well developed, in no apparent distress.  Ophthalmologic - fundi not visualized due to noncooperation.  Cardiovascular - Regular rhythm and rate.  Neuro - awake, alert, eyes open, orientated to age, place, time and people. No aphasia, fluent language, following all simple commands. Able to name and repeat. No gaze palsy, tracking bilaterally, visual field full, PERRL. Mild right nasolabial fold flattening likely chronic from last stroke. Tongue midline. RUE and RLE 5/5, LUE 3/5 and LLE 3/5 with significant ataxia on the FTN and HTS on the left, sensation loss on the left UE and LE, gait not tested.    ASSESSMENT/PLAN Ms. Shelby Morgan is a 66 y.o. female with history of hypertension, hyperlipidemia, diabetes, obesity, Bell's palsy, and stroke in 03/2021 admitted for left-sided numbness and weakness.  Status post TNK.  Stroke:  right thalamus infarct, secondary to small vessel disease source CT head no acute abnormality CT head and neck pending MRI right  thalamic infarct 2D Echo EF 60 to 65% LDL 82 HgbA1c 7.1 UDS negative SCDs for VTE prophylaxis Aspirin 81 prior to admission, now on No antithrombotic within 24 hours post TNK. Patient counseled to be compliant with her antithrombotic medications Ongoing aggressive stroke risk factor management Therapy recommendations: CIR Disposition: Pending  History of stroke 03/2021 MRI showed left thalamic infarct.  CTA head and neck right PCA occlusion, left P2 moderate stenosis, left P3 occlusion.  LDL 52, A1c 6.4.  Discharged on DAPT for 3 weeks and then aspirin alone.  Also discharged on Zocor 40.  Diabetes HgbA1c 7.1 goal < 7.0 Uncontrolled CBG monitoring SSI DM education and close PCP follow up  Hypertension  Stable BP goal less than 180/105 after TNK Long term BP goal normotensive  Hyperlipidemia Home meds: Zocor 40 LDL 82, goal < 70 Now on Crestor 20 Continue statin at discharge  Other Stroke Risk Factors Advanced age Obesity, Body mass index is 42.18 kg/m.   Other Active Problems History of Bell's palsy Melanoma in 2009  Hospital day # 1  This patient is critically ill due to acute stroke status post TNK and at significant risk of neurological worsening, death form recurrent stroke, hemorrhagic transformation, bleeding from TNK. This patient's care requires constant monitoring of vital signs, hemodynamics, respiratory and cardiac monitoring, review of multiple databases, neurological assessment, discussion with family, other specialists and medical decision making of high complexity. I spent 35 minutes of neurocritical care time in the care of this patient. I had long discussion with patient, brother and mom at bedside, updated pt current condition, treatment plan and potential prognosis, and answered all the questions.  They expressed understanding and appreciation.    Rosalin Hawking, MD PhD Stroke Neurology 01/07/2022 1:13 PM    To contact Stroke Continuity provider,  please refer to [email or web address] After hours, contact General Neurology

## 2022-01-07 NOTE — TOC CAGE-AID Note (Signed)
Transition of Care [facility]) - CAGE-AID Screening   Patient Details  Name: Shelby Morgan MRN: [id number] Date of Birth: 30-Jul-1955  Transition of Care [facility]) CM/SW Contact:    Andren Bethea C Tarpley-Carter, [place] Phone Number: 01/07/2022, 12:21 PM   Clinical Narrative: Pt is unable to participate in Cage Aid. Pt is currently absent from room.  CSW will attempt to assess at a better time.  Soledad Budreau Tarpley-Carter, MSW, LCSW-A Pronouns:  She/Her/Hers [facility] HealthTransitions of Care Clinical Social Worker Direct Number:  [phone] [email or web address]  CAGE-AID Screening: Substance Abuse Screening unable to be completed due to: : Patient unable to participate

## 2022-01-07 NOTE — Progress Notes (Signed)
Inpatient Diabetes Program Recommendations  AACE/ADA: New Consensus Statement on Inpatient Glycemic Control (2015)  Target Ranges:  Prepandial:   less than 140 mg/dL      Peak postprandial:   less than 180 mg/dL (1-2 hours)      Critically ill patients:  140 - 180 mg/dL   Lab Results  Component Value Date   GLUCAP 189 (H) 01/07/2022   HGBA1C 7.1 (H) 01/07/2022    Review of Glycemic Control  Latest Reference Range & Units 01/06/22 23:58 01/07/22 04:05 01/07/22 07:30 01/07/22 12:01  Glucose-Capillary 70 - 99 mg/dL 138 (H) 142 (H) 176 (H) 189 (H)   Diabetes history: DM 2 Outpatient Diabetes medications:  Januvia 100 mg daily, Metformin XR 1000 mg bid Current orders for Inpatient glycemic control:  Novolog 0-20 units tid with meals  Inpatient Diabetes Program Recommendations:   May consider adding Semglee 10 units daily while in the hospital.  Also may consider adding GLP-1 such as Ozempic at discharge?   Will follow.   Thanks,  Adah Perl, RN, BC-ADM Inpatient Diabetes Coordinator Pager [phone]  (8a-5p)

## 2022-01-07 NOTE — Progress Notes (Signed)
PT Cancellation Note  Patient Details Name: Shelby Morgan MRN: [id number] DOB: 01/27/1956   Cancelled Treatment:    Reason Eval/Treat Not Completed: Medical issues which prohibited therapy.  Active bedrest order- pt on strict bedrest, will follow and see as appropriate and activity orders are updated.  01/07/2022  [name], PT Acute Rehabilitation Services [phone]  (pager) [phone]  (office)   [name] 01/07/2022, 9:59 AM

## 2022-01-07 NOTE — Progress Notes (Signed)
OT Cancellation Note  Patient Details Name: Shelby Morgan MRN: [id number] DOB: 1955-12-10   Cancelled Treatment:    Reason Eval/Treat Not Completed: Active bedrest order- pt on strict bedrest, will follow and see as appropriate and activity orders are updated.   Jolaine Artist, OT Acute Rehabilitation Services Office [phone]   Delight Stare 01/07/2022, 8:06 AM

## 2022-01-07 NOTE — Progress Notes (Signed)
Inpatient Rehab Admissions Coordinator Note:   Per PT/OT recommendations patient was screened for CIR candidacy by Michel Santee, PT. At this time, pt appears to be a potential candidate for CIR. I will place an order for rehab consult for full assessment, per our protocol.  Please contact me any with questions.Shann Medal, PT, DPT [phone] 01/07/22 4:45 PM

## 2022-01-07 NOTE — Progress Notes (Signed)
Pt belongings at bed side. 2x Personal bags, 1x Pt belongings bag, Cell phone and charger at bedside, and eyeglasses (left handle broken PTA) at bedside.

## 2022-01-07 NOTE — Evaluation (Signed)
Occupational Therapy Evaluation Patient Details Name: Shelby Morgan MRN: [id number] DOB: 07/21/1956 Today's Date: 01/07/2022   History of Present Illness pt is a 66 y/o female admitted 6/13 with Left sided numbness and weakness.  MRI shows a 1.2 cm acute right thalamocapsular infarct--non hemorrhagic.Marland Kitchen  PMHx Bells palsy, HLD, DM, HTN, melanoma, Left thalamic stroke 2022, NAFLD   Clinical Impression   Pt admitted for above and limited by problem list below, including impaired cognition, L sided weakness/impaired sensation/coordination/proprioception, impaired balance and decreased activity tolerance.  PTA patient independent and driving.  Today, she requires min-mod assist for bed mobility, mod assist +2 for side stepping at EOB, and min to total assist +2 for ADLs.  Cognitively, she is oriented and follows simple commands but demonstrates poor problem solving, sequencing and attention (she is highly fearful of falling as well). She will benefit from continued OT services while admitted and after dc at AIR level to optimize independence, safety and return to PLOF.  Will follow acutely.      Recommendations for follow up therapy are one component of a multi-disciplinary discharge planning process, led by the attending physician.  Recommendations may be updated based on patient status, additional functional criteria and insurance authorization.   Follow Up Recommendations  Acute inpatient rehab (3hours/day)    Assistance Recommended at Discharge Frequent or constant Supervision/Assistance  Patient can return home with the following Two people to help with walking and/or transfers;Two people to help with bathing/dressing/bathroom;Direct supervision/assist for medications management;Assistance with cooking/housework;Direct supervision/assist for financial management;Assist for transportation;Help with stairs or ramp for entrance    Functional Status Assessment  Patient has had a recent decline in  their functional status and demonstrates the ability to make significant improvements in function in a reasonable and predictable amount of time.  Equipment Recommendations  Other (comment) (defer)    Recommendations for Other Services Rehab consult     Precautions / Restrictions Precautions Precautions: Fall Precaution Comments: BP parameters 180/105 Restrictions Weight Bearing Restrictions: No      Mobility Bed Mobility Overal bed mobility: Needs Assistance Bed Mobility: Supine to Sit, Sit to Supine     Supine to sit: Min assist Sit to supine: Mod assist   General bed mobility comments: min assist to elevated trunk and sequence to EOB, BL E support and cueing for sequencing once returned to supine    Transfers Overall transfer level: Needs assistance Equipment used: 2 person hand held assist Transfers: Sit to/from Stand Sit to Stand: Mod assist, Min assist, +2 physical assistance, +2 safety/equipment           General transfer comment: min assist +2 to power up and steady from EOB, dynamically at EOB requires mod assist +2      Balance Overall balance assessment: Needs assistance Sitting-balance support: No upper extremity supported, Feet supported Sitting balance-Leahy Scale: Fair Sitting balance - Comments: min assist to min guard dynamically for safety   Standing balance support: Bilateral upper extremity supported, During functional activity Standing balance-Leahy Scale: Poor Standing balance comment: relies on BUE and external support                           ADL either performed or assessed with clinical judgement   ADL Overall ADL's : Needs assistance/impaired     Grooming: Minimal assistance;Sitting Grooming Details (indicate cue type and reason): able to wash face with R UE, wash hands with increased time but would require assist with manging  toothpaste         Upper Body Dressing : Moderate assistance;Sitting   Lower Body  Dressing: +2 for physical assistance;+2 for safety/equipment;Total assistance;Sit to/from stand   Toilet Transfer: Maximal assistance;+2 for physical assistance;+2 for safety/equipment Toilet Transfer Details (indicate cue type and reason): simulated side stepping towards [facility] [place] Toileting- Clothing Manipulation and Hygiene: Total assistance;+2 for physical assistance;+2 for safety/equipment       Functional mobility during ADLs: Moderate assistance;+2 for physical assistance;+2 for safety/equipment;Minimal assistance       Vision Baseline Vision/History: 1 Wears glasses Ability to See in Adequate Light: 0 Adequate (wear glasses all the time) Patient Visual Report: No change from baseline Vision Assessment?: No apparent visual deficits     Perception     Praxis      Pertinent Vitals/Pain Pain Assessment Pain Assessment: No/denies pain     Hand Dominance Right   Extremity/Trunk Assessment Upper Extremity Assessment Upper Extremity Assessment: RUE deficits/detail;LUE deficits/detail RUE Deficits / Details: hx of CVA with weakness 3/5 MMT, slow movements but coordiated RUE Sensation: decreased light touch RUE Coordination: WNL LUE Deficits / Details: grosly 3-/5 MMT, poor coordination, proprioception and no sensation. pt using functionally during ADL tasks, but pt unable to control UE at times mobility tasks LUE Sensation: decreased light touch;decreased proprioception LUE Coordination: decreased fine motor;decreased gross motor   Lower Extremity Assessment Lower Extremity Assessment: Defer to PT evaluation   Cervical / Trunk Assessment Cervical / Trunk Assessment: Other exceptions Cervical / Trunk Exceptions: increased body habitus   Communication Communication Communication: No difficulties   Cognition Arousal/Alertness: Awake/alert Behavior During Therapy: Impulsive Overall Cognitive Status: Impaired/Different from baseline Area of Impairment: Following commands,  Attention, Memory, Safety/judgement, Awareness, Problem solving                   Current Attention Level: Sustained Memory: Decreased short-term memory Following Commands: Follows one step commands consistently, Follows one step commands with increased time, Follows multi-step commands inconsistently Safety/Judgement: Decreased awareness of safety, Decreased awareness of deficits Awareness: Emergent Problem Solving: Slow processing, Requires verbal cues, Difficulty sequencing, Requires tactile cues General Comments: pt fearful of falling, slightly impulsive with difficulty following mulitple step commands and sequencing functionally     General Comments  VSS    Exercises     Shoulder Instructions      Home Living Family/patient expects to be discharged to:: Private residence Living Arrangements: Alone Available Help at Discharge: Family Type of Home: House Home Access: Stairs to enter Technical brewer of Steps: 7 Entrance Stairs-Rails: Can reach both Home Layout: One level     Bathroom Shower/Tub: Teacher, early years/pre: Standard     Home Equipment: Grab bars - tub/shower;Rolling Environmental consultant (2 wheels);Cane - single point          Prior Functioning/Environment Prior Level of Function : Independent/Modified Independent;Driving             Mobility Comments: no AD ADLs Comments: independent        OT Problem List: Decreased strength;Decreased activity tolerance;Impaired balance (sitting and/or standing);Decreased coordination;Decreased cognition;Decreased safety awareness;Decreased knowledge of use of DME or AE;Decreased knowledge of precautions;Cardiopulmonary status limiting activity;Impaired sensation;Impaired UE functional use;Obesity      OT Treatment/Interventions: Self-care/ADL training;Therapeutic exercise;DME and/or AE instruction;Therapeutic activities;Cognitive remediation/compensation;Patient/family education;Balance  training;Visual/perceptual remediation/compensation;Neuromuscular education    OT Goals(Current goals can be found in the care plan section) Acute Rehab OT Goals Patient Stated Goal: get my independence back OT Goal Formulation: With patient Time For  Goal Achievement: 01/21/22 Potential to Achieve Goals: Good  OT Frequency: Min 2X/week    Co-evaluation PT/OT/SLP Co-Evaluation/Treatment: Yes Reason for Co-Treatment: For patient/therapist safety;To address functional/ADL transfers   OT goals addressed during session: ADL's and self-care      AM-PAC OT "6 Clicks" Daily Activity     Outcome Measure Help from another person eating meals?: A Little Help from another person taking care of personal grooming?: A Little Help from another person toileting, which includes using toliet, bedpan, or urinal?: Total Help from another person bathing (including washing, rinsing, drying)?: A Lot Help from another person to put on and taking off regular upper body clothing?: A Lot Help from another person to put on and taking off regular lower body clothing?: Total 6 Click Score: 12   End of Session Equipment Utilized During Treatment: Gait belt Nurse Communication: Mobility status  Activity Tolerance: Patient tolerated treatment well Patient left: in bed;with call bell/phone within reach;with bed alarm set  OT Visit Diagnosis: Other abnormalities of gait and mobility (R26.89);Other symptoms and signs involving cognitive function                Time: 5643-3295 OT Time Calculation (min): 34 min Charges:  OT General Charges $OT Visit: 1 Visit OT Evaluation $OT Eval Moderate Complexity: 1 Mod  Jolaine Artist, OT Acute Rehabilitation Services Office [phone]   Delight Stare 01/07/2022, 1:12 PM

## 2022-01-08 DIAGNOSIS — I639 Cerebral infarction, unspecified: Secondary | ICD-10-CM | POA: Diagnosis not present

## 2022-01-08 DIAGNOSIS — E11649 Type 2 diabetes mellitus with hypoglycemia without coma: Secondary | ICD-10-CM | POA: Diagnosis not present

## 2022-01-08 LAB — GLUCOSE, CAPILLARY
Glucose-Capillary: 183 mg/dL — ABNORMAL HIGH (ref 70–99)
Glucose-Capillary: 182 mg/dL — ABNORMAL HIGH (ref 70–99)
Glucose-Capillary: 192 mg/dL — ABNORMAL HIGH (ref 70–99)
Glucose-Capillary: 175 mg/dL — ABNORMAL HIGH (ref 70–99)

## 2022-01-08 LAB — CBC
HCT: 39.2 % (ref 36.0–46.0)
Hemoglobin: 12.7 g/dL (ref 12.0–15.0)
MCH: 27.4 pg (ref 26.0–34.0)
MCHC: 32.4 g/dL (ref 30.0–36.0)
MCV: 84.7 fL (ref 80.0–100.0)
Platelets: 345 10*3/uL (ref 150–400)
RBC: 4.63 MIL/uL (ref 3.87–5.11)
RDW: 13.9 % (ref 11.5–15.5)
WBC: 8 10*3/uL (ref 4.0–10.5)
nRBC: 0 % (ref 0.0–0.2)

## 2022-01-08 LAB — BASIC METABOLIC PANEL
Anion gap: 13 (ref 5–15)
BUN: 14 mg/dL (ref 8–23)
CO2: 26 mmol/L (ref 22–32)
Calcium: 9.6 mg/dL (ref 8.9–10.3)
Chloride: 103 mmol/L (ref 98–111)
Creatinine, Ser: 0.95 mg/dL (ref 0.44–1.00)
GFR, Estimated: >60 mL/min (ref >60)
Glucose, Bld: 215 mg/dL — ABNORMAL HIGH (ref 70–99)
Potassium: 3.9 mmol/L (ref 3.5–5.1)
Sodium: 142 mmol/L (ref 135–145)

## 2022-01-08 MED ORDER — LABETALOL HCL 5 MG/ML IV SOLN: 10.0000 mg | INTRAVENOUS | Status: DC | PRN

## 2022-01-08 MED ORDER — CLOPIDOGREL BISULFATE 75 MG PO TABS
75.0000 mg | ORAL_TABLET | Freq: Every day | ORAL | Status: DC
  Administered 2022-01-08 – 2022-01-14 (×7): 75 mg via ORAL
  Filled 2022-01-08 (×7): qty 1

## 2022-01-08 MED ORDER — LISINOPRIL 20 MG PO TABS
20.0000 mg | ORAL_TABLET | Freq: Every day | ORAL | Status: DC
  Administered 2022-01-08 – 2022-01-14 (×7): 20 mg via ORAL
  Filled 2022-01-08 (×7): qty 1

## 2022-01-08 MED ORDER — ENOXAPARIN SODIUM 40 MG/0.4ML IJ SOSY
40.0000 mg | PREFILLED_SYRINGE | INTRAMUSCULAR | Status: DC
  Administered 2022-01-08 – 2022-01-14 (×7): 40 mg via SUBCUTANEOUS
  Filled 2022-01-08 (×7): qty 0.4

## 2022-01-08 MED ORDER — ASPIRIN 81 MG PO TBEC
81.0000 mg | DELAYED_RELEASE_TABLET | Freq: Every day | ORAL | Status: DC
Start: 2022-01-08 — End: 2022-01-14
  Administered 2022-01-08 – 2022-01-14 (×7): 81 mg via ORAL
  Filled 2022-01-08 (×7): qty 1

## 2022-01-08 MED ORDER — PANTOPRAZOLE SODIUM 40 MG PO TBEC
40.0000 mg | DELAYED_RELEASE_TABLET | Freq: Every day | ORAL | Status: DC
  Administered 2022-01-08 – 2022-01-14 (×7): 40 mg via ORAL
  Filled 2022-01-08 (×7): qty 1

## 2022-01-08 NOTE — Progress Notes (Signed)
IP rehab admissions - met with patient at the bedside.  I gave patient rehab booklets and explained rehab options.  Also met with son and daughter in law.  Family in agreement to CIR.  I have opened the case with [facility] and will await decision.  Call me for questions.  [phone]

## 2022-01-08 NOTE — TOC CAGE-AID Note (Signed)
Transition of Care [facility]) - CAGE-AID Screening   Patient Details  Name: Shelby Morgan MRN: [id number] Date of Birth: April 06, 1956  [facility] CM/SW Contact:    Gaetano Hawthorne Tarpley-Carter, [place] Phone Number: 01/08/2022, 12:05 PM   Clinical Narrative: Pt participated in [place].  Pt stated she does not use substance or ETOH (socially).  Pt was offered resources, due to usage of ETOH.     Lusero Nordlund Tarpley-Carter, MSW, LCSW-A Pronouns:  She/Her/Hers [facility] Clinical Social Worker Direct Number:  [phone] [email or web address]  CAGE-AID Screening: Substance Abuse Screening unable to be completed due to: : Patient unable to participate  Have You Ever Felt You Ought to Cut Down on Your Drinking or Drug Use?: No Have People Annoyed You By Critizing Your Drinking Or Drug Use?: No Have You Felt Bad Or Guilty About Your Drinking Or Drug Use?: No Have You Ever Had a Drink or Used Drugs First Thing In The Morning to Steady Your Nerves or to Get Rid of a Hangover?: No CAGE-AID Score: 0  Substance Abuse Education Offered: Yes  Substance abuse interventions: Scientist, clinical (histocompatibility and immunogenetics)

## 2022-01-08 NOTE — Progress Notes (Signed)
Physical Therapy Treatment Patient Details Name: Shelby Morgan MRN: [id number] DOB: 1955/11/05 Today's Date: 01/08/2022   History of Present Illness pt is a 66 y/o female admitted 6/13 with Left sided numbness and weakness.  MRI shows a 1.2 cm acute right thalamocapsular infarct--non hemorrhagic.Marland Kitchen  PMHx Bells palsy, HLD, DM, HTN, melanoma, Left thalamic stroke 2022, NAFLD    PT Comments    Noticeable improvements toward goals, limited by virtually total sensory loss L side.  Emphasis on rolling, side to sit, scooting, safe sit to stands, pre gait activity at EOB, then progression of gait close to seating with heavy cueing and moderate assist of 2 persons.   Recommendations for follow up therapy are one component of a multi-disciplinary discharge planning process, led by the attending physician.  Recommendations may be updated based on patient status, additional functional criteria and insurance authorization.  Follow Up Recommendations  Acute inpatient rehab (3hours/day)     Assistance Recommended at Discharge Frequent or constant Supervision/Assistance  Patient can return home with the following A little help with bathing/dressing/bathroom;Assistance with cooking/housework;Direct supervision/assist for medications management;Direct supervision/assist for financial management;Assist for transportation;Help with stairs or ramp for entrance;A lot of help with walking and/or transfers   Equipment Recommendations  Other (comment) (TBD next venue)    Recommendations for Other Services Rehab consult     Precautions / Restrictions Precautions Precautions: Fall Restrictions LLE Weight Bearing: Non weight bearing     Mobility  Bed Mobility Overal bed mobility: Needs Assistance Bed Mobility: Rolling, Sidelying to Sit Rolling: Min assist Sidelying to sit: Mod assist, +2 for safety/equipment, Min assist       General bed mobility comments: more difficult transition via L elbow, pt  need more truncal assist .    Transfers Overall transfer level: Needs assistance Equipment used: 2 person hand held assist Transfers: Sit to/from Stand Sit to Stand: Mod assist, +2 physical assistance, +2 safety/equipment           General transfer comment: cues for hand placement/technique, assist holding l hand to the bed for better positioning, Assist forward with significant boost.  Had pt try to guage where her feet should be prior to standing without looking down with feedback and assist afterward.    Ambulation/Gait Ambulation/Gait assistance: Mod assist, +2 physical assistance Gait Distance (Feet): 6 Feet (to chair and then additional 8 feet) Assistive device: 2 person hand held assist Gait Pattern/deviations: Step-to pattern, Step-through pattern   Gait velocity interpretation: <1.31 ft/sec, indicative of household ambulator   General Gait Details: Due to absence of sensation L LE, pt's step were ataxic, unccordinated and eratic.  Worked on some visual compensation and using some muscle memory.   Stairs             Wheelchair Mobility    Modified Rankin (Stroke Patients Only) Modified Rankin (Stroke Patients Only) Pre-Morbid Rankin Score: Slight disability Modified Rankin: Moderately severe disability     Balance Overall balance assessment: Needs assistance   Sitting balance-Leahy Scale: Fair Sitting balance - Comments: in lieu of donning socks, worked on symmetrical and asymetrical scooting.   Standing balance support: Bilateral upper extremity supported, During functional activity Standing balance-Leahy Scale: Poor Standing balance comment: relies on BUE and external support                            Cognition Arousal/Alertness: Awake/alert Behavior During Therapy: WFL for tasks assessed/performed Overall Cognitive Status:  (NT formall, follow  instruction well.)                                          Exercises       General Comments General comments (skin integrity, edema, etc.): vss      Pertinent Vitals/Pain Pain Assessment Pain Assessment: Faces Faces Pain Scale: No hurt Pain Intervention(s): Monitored during session    Home Living                          Prior Function            PT Goals (current goals can now be found in the care plan section) Acute Rehab PT Goals Patient Stated Goal: back to PLOF, living by myself, driving. PT Goal Formulation: With patient Time For Goal Achievement: 01/21/22 Potential to Achieve Goals: Good Progress towards PT goals: Progressing toward goals    Frequency    Min 4X/week      PT Plan Current plan remains appropriate    Co-evaluation              AM-PAC PT "6 Clicks" Mobility   Outcome Measure  Help needed turning from your back to your side while in a flat bed without using bedrails?: A Lot Help needed moving from lying on your back to sitting on the side of a flat bed without using bedrails?: A Lot Help needed moving to and from a bed to a chair (including a wheelchair)?: Total Help needed standing up from a chair using your arms (e.g., wheelchair or bedside chair)?: Total Help needed to walk in hospital room?: Total Help needed climbing 3-5 steps with a railing? : Total 6 Click Score: 8    End of Session   Activity Tolerance: Patient tolerated treatment well Patient left: in chair;with call bell/phone within reach;with chair alarm set Nurse Communication: Mobility status PT Visit Diagnosis: Other abnormalities of gait and mobility (R26.89);Hemiplegia and hemiparesis;Other symptoms and signs involving the nervous system (R29.898) Hemiplegia - Right/Left: Left Hemiplegia - dominant/non-dominant: Non-dominant Hemiplegia - caused by: Cerebral infarction     Time: 0623-7628 PT Time Calculation (min) (ACUTE ONLY): 35 min  Charges:  $Gait Training: 8-22 mins $Therapeutic Activity: 8-22 mins                      01/08/2022  [name], PT Acute Rehabilitation Services [phone]  (pager) [phone]  (office)   [name] 01/08/2022, 7:47 PM

## 2022-01-08 NOTE — Progress Notes (Signed)
STROKE TEAM PROGRESS NOTE   SUBJECTIVE (INTERVAL HISTORY) Son and daughter in law are at the bedside. Reviewed neuro imaging with them and answered all their questions. Pt left side weakness improved, pending CIR.   OBJECTIVE Temp:  [98.1 F (36.7 C)-98.5 F (36.9 C)] 98.5 F (36.9 C) (06/14 1944) Pulse Rate:  [65-84] 65 (06/15 0403) Cardiac Rhythm: Normal sinus rhythm (06/14 2027) Resp:  [12-28] 16 (06/15 0403) BP: (111-200)/(70-148) 111/70 (06/15 0403) SpO2:  [86 %-100 %] 97 % (06/15 0403)  Recent Labs  Lab 01/07/22 0730 01/07/22 1201 01/07/22 1500 01/07/22 1743 01/08/22 0747  GLUCAP 176* 189* 148* 136* 183*   Recent Labs  Lab 01/06/22 1821 01/06/22 1831  NA 137 140  K 3.6 3.7  CL 105 102  CO2 24  --   GLUCOSE 205* 205*  BUN 14 13  CREATININE 0.91 0.80  CALCIUM 9.0  --    Recent Labs  Lab 01/06/22 1821  AST 36  ALT 38  ALKPHOS 61  BILITOT 0.7  PROT 7.7  ALBUMIN 3.8   Recent Labs  Lab 01/06/22 1821 01/06/22 1831  WBC 8.3  --   NEUTROABS 5.1  --   HGB 13.1 13.6  HCT 39.7 40.0  MCV 83.9  --   PLT 389  --    No results for input(s): "CKTOTAL", "CKMB", "CKMBINDEX", "TROPONINI" in the last 168 hours. Recent Labs    01/06/22 1821  LABPROT 12.6  INR 1.0   Recent Labs    01/06/22 1927  COLORURINE YELLOW  LABSPEC 1.005  PHURINE 6.0  GLUCOSEU NEGATIVE  HGBUR NEGATIVE  BILIRUBINUR NEGATIVE  KETONESUR NEGATIVE  PROTEINUR NEGATIVE  NITRITE NEGATIVE  LEUKOCYTESUR NEGATIVE       Component Value Date/Time   CHOL 156 01/07/2022 0418   TRIG 107 01/07/2022 0418   HDL 53 01/07/2022 0418   CHOLHDL 2.9 01/07/2022 0418   VLDL 21 01/07/2022 0418   LDLCALC 82 01/07/2022 0418   Lab Results  Component Value Date   HGBA1C 7.1 (H) 01/07/2022      Component Value Date/Time   LABOPIA NONE DETECTED 01/06/2022 1927   COCAINSCRNUR NONE DETECTED 01/06/2022 1927   LABBENZ NONE DETECTED 01/06/2022 1927   AMPHETMU NONE DETECTED 01/06/2022 1927   THCU NONE  DETECTED 01/06/2022 1927   LABBARB NONE DETECTED 01/06/2022 1927    Recent Labs  Lab 01/06/22 1821  ETH <10    I have personally reviewed the radiological images below and agree with the radiology interpretations.  CT ANGIO HEAD NECK W WO CM  Result Date: 01/08/2022 CLINICAL DATA:  Follow-up examination for acute stroke. EXAM: CT ANGIOGRAPHY HEAD AND NECK TECHNIQUE: Multidetector CT imaging of the head and neck was performed using the standard protocol during bolus administration of intravenous contrast. Multiplanar CT image reconstructions and MIPs were obtained to evaluate the vascular anatomy. Carotid stenosis measurements (when applicable) are obtained utilizing NASCET criteria, using the distal internal carotid diameter as the denominator. RADIATION DOSE REDUCTION: This exam was performed according to the departmental dose-optimization program which includes automated exposure control, adjustment of the mA and/or kV according to patient size and/or use of iterative reconstruction technique. CONTRAST:  42m OMNIPAQUE IOHEXOL 350 MG/ML SOLN COMPARISON:  Prior MRI from 01/06/2022 as well as prior CTA from 04/10/2021 and chest CT from 06/06/2018. FINDINGS: CT HEAD FINDINGS Brain: There has been continued interval evolution of previously identified acute infarct involving the right thalamic capsular region, stable in size as compared to previous MRI. No associated  mass effect or evidence for hemorrhagic transformation. No other acute intracranial hemorrhage or large vessel territory infarct. Underlying atrophy with mild chronic small vessel ischemic disease noted. Small remote left thalamic lacunar infarct noted. No mass lesion or midline shift. No hydrocephalus or extra-axial fluid collection. Vascular: No hyperdense vessel. Scattered vascular calcifications noted within the carotid siphons. Skull: Scalp soft tissues and calvarium within normal limits. Sinuses: Paranasal sinuses are largely clear.  No  mastoid effusion. Orbits: Globes orbital soft tissues demonstrate no acute finding. Review of the MIP images confirms the above findings CTA NECK FINDINGS Aortic arch: Visualized aortic arch normal in caliber with standard 3 vessel branching pattern. Mild plaque within the arch itself. No stenosis about the origin of the great vessels. Right carotid system: Right common and internal carotid arteries patent without stenosis or dissection. Mild for age plaque about the right carotid bulb without stenosis. Left carotid system: Left common and internal carotid arteries patent without stenosis or dissection. Mild for age plaque about the left carotid bulb without significant stenosis. Vertebral arteries: Both vertebral arteries arise from the subclavian arteries. No proximal subclavian artery stenosis. Both vertebral arteries widely patent without stenosis, dissection or occlusion. Skeleton: No discrete or worrisome osseous lesions. Segmental fusion of the C3 and C4 vertebral bodies noted. Moderate spondylosis noted at C5-6 and C6-7. Other neck: No other acute soft tissue abnormality within the neck. 8 mm soft tissue nodule present at the superior aspect left parotid gland, indeterminate. Few additional scattered subcentimeter nodular densities about the bilateral parotid glands favored to reflect intraparotid lymph nodes. 9 mm left thyroid nodule, of doubtful significance given size and patient age, no follow-up imaging recommended (ref: J Am Coll Radiol. 2015 Feb;12(2): 143-50). Upper chest: Visualized upper chest demonstrates no acute finding. Irregular soft tissue density partially visualized at the level of the AP window (series 5, image 161), likely adenopathy as seen on prior studies. This appears somewhat increased in size measuring up to 2.5 cm (previously 1.9 cm on prior chest CT from 06/06/2018. Review of the MIP images confirms the above findings CTA HEAD FINDINGS Anterior circulation: Petrous segments patent  bilaterally. Atheromatous change within the carotid siphons without hemodynamically significant stenosis. A1 segments, anterior communicating artery complex common anterior cerebral arteries patent without stenosis. No M1 stenosis or occlusion. No proximal MCA branch occlusion. Distal left MCA branches widely patent. There is a focal severe proximal right M3 stenosis (series 13, image 76). Right MCA branches otherwise patent and well perfused. Posterior circulation: Both V4 segments patent without significant stenosis. Left PICA patent. Right PICA origin not well seen. Basilar patent to its distal aspect without stenosis superior cerebellar arteries patent proximally. Both PCAs primarily supplied via the basilar. Focal moderate proximal left P2 stenosis (series 12, image 117). Additional severe distal left P3 stenoses (series 12, image 137). Left PCA attenuated but patent distally. On the right, there is a focal severe right P2 stenosis (series 12, image 115) right PCA mildly irregular but otherwise patent to its distal aspect. Venous sinuses: Patent allowing for timing the contrast bolus. Anatomic variants: None significant.  No aneurysm. Review of the MIP images confirms the above findings IMPRESSION: CT HEAD IMPRESSION: 1. Normal expected interval evolution of acute right thalamic infarct, stable in size relative to previous MRI. No evidence for hemorrhagic transformation or other complication. 2. No other new acute intracranial abnormality. 3. Underlying atrophy with chronic small vessel ischemic disease. CTA HEAD AND NECK: 1. Negative CTA for large vessel occlusion. 2. Intracranial  atherosclerotic disease with multifocal moderate to severe right M3, bilateral P2, and distal left P3 stenoses as above. No proximal high-grade or correctable stenosis. Overall, appearance is similar as compared to prior CTA from 04/10/2021. 3. Wide patency of the major arterial vasculature within the neck. 4. Mediastinal adenopathy,  partially visualized and incompletely assessed on this exam. Further evaluation with dedicated cross-sectional imaging of the chest suggested for complete evaluation. 5. 8 mm nodular lesion within the superior left parotid gland, indeterminate. While this may reflect an intraparotid lymph node, a possible small primary salivary neoplasm could also have this appearance. Nonemergent outpatient ENT referral for further workup suggested. Electronically Signed   By: Jeannine Boga M.D.   On: 01/08/2022 05:24   ECHOCARDIOGRAM COMPLETE  Result Date: 01/07/2022    ECHOCARDIOGRAM REPORT   Patient Name:   BABY GIEGER Date of Exam: 01/07/2022 Medical Rec #:  [id number]      Height:       62.0 in Accession #:    [id number]     Weight:       230.6 lb Date of Birth:  1955-12-27      BSA:          2.031 m Patient Age:    66 years       BP:           161/93 mmHg Patient Gender: F              HR:           82 bpm. Exam Location:  Inpatient Procedure: 2D Echo, Cardiac Doppler and Color Doppler Indications:    Stroke  History:        Patient has prior history of Echocardiogram examinations, most                 recent 04/11/2021. Risk Factors:Hypertension, Diabetes and HLD.  Sonographer:    Joette Catching RCS Referring Phys: [place]  1. Left ventricular ejection fraction, by estimation, is 60 to 65%. The left ventricle has normal function. The left ventricle has no regional wall motion abnormalities. There is mild left ventricular hypertrophy. Left ventricular diastolic parameters are indeterminate.  2. Right ventricular systolic function is normal. The right ventricular size is normal.  3. Trivial mitral valve regurgitation.  4. The aortic valve is tricuspid. Aortic valve regurgitation is not visualized. Aortic valve sclerosis is present, with no evidence of aortic valve stenosis.  5. The inferior vena cava is normal in size with greater than 50% respiratory variability, suggesting right atrial  pressure of 3 mmHg. Comparison(s): The left ventricular function is unchanged. FINDINGS  Left Ventricle: Left ventricular ejection fraction, by estimation, is 60 to 65%. The left ventricle has normal function. The left ventricle has no regional wall motion abnormalities. The left ventricular internal cavity size was normal in size. There is  mild left ventricular hypertrophy. Left ventricular diastolic parameters are indeterminate. Right Ventricle: The right ventricular size is normal. Right vetricular wall thickness was not assessed. Right ventricular systolic function is normal. Left Atrium: Left atrial size was normal in size. Right Atrium: Right atrial size was normal in size. Pericardium: There is no evidence of pericardial effusion. Mitral Valve: There is mild thickening of the mitral valve leaflet(s). There is mild calcification of the mitral valve leaflet(s). Trivial mitral valve regurgitation. Tricuspid Valve: The tricuspid valve is normal in structure. Tricuspid valve regurgitation is trivial. Aortic Valve: The aortic valve is tricuspid. Aortic valve regurgitation is not  visualized. Aortic valve sclerosis is present, with no evidence of aortic valve stenosis. Aortic valve mean gradient measures 4.0 mmHg. Aortic valve peak gradient measures 6.6  mmHg. Aortic valve area, by VTI measures 2.65 cm. Pulmonic Valve: The pulmonic valve was normal in structure. Pulmonic valve regurgitation is not visualized. Aorta: The aortic root and ascending aorta are structurally normal, with no evidence of dilitation. Venous: The inferior vena cava is normal in size with greater than 50% respiratory variability, suggesting right atrial pressure of 3 mmHg. IAS/Shunts: No atrial level shunt detected by color flow Doppler.  LEFT VENTRICLE PLAX 2D LVIDd:         3.80 cm   Diastology LVIDs:         2.50 cm   LV e' medial:    6.96 cm/s LV PW:         0.70 cm   LV E/e' medial:  10.4 LV IVS:        1.30 cm   LV e' lateral:   9.25  cm/s LVOT diam:     2.00 cm   LV E/e' lateral: 7.8 LV SV:         70 LV SV Index:   34 LVOT Area:     3.14 cm  RIGHT VENTRICLE             IVC RV Basal diam:  2.30 cm     IVC diam: 1.40 cm RV Mid diam:    2.50 cm RV S prime:     18.70 cm/s TAPSE (M-mode): 2.4 cm LEFT ATRIUM             Index        RIGHT ATRIUM           Index LA diam:        3.00 cm 1.48 cm/m   RA Area:     11.40 cm LA Vol (A2C):   33.4 ml 16.45 ml/m  RA Volume:   19.60 ml  9.65 ml/m LA Vol (A4C):   39.3 ml 19.35 ml/m LA Biplane Vol: 36.5 ml 17.97 ml/m  AORTIC VALVE                    PULMONIC VALVE AV Area (Vmax):    2.80 cm     PV Vmax:       0.90 m/s AV Area (Vmean):   2.43 cm     PV Peak grad:  3.2 mmHg AV Area (VTI):     2.65 cm AV Vmax:           128.00 cm/s AV Vmean:          93.800 cm/s AV VTI:            0.264 m AV Peak Grad:      6.6 mmHg AV Mean Grad:      4.0 mmHg LVOT Vmax:         114.00 cm/s LVOT Vmean:        72.600 cm/s LVOT VTI:          0.223 m LVOT/AV VTI ratio: 0.84  AORTA Ao Root diam: 3.40 cm Ao Asc diam:  3.80 cm MITRAL VALVE                TRICUSPID VALVE MV Area (PHT): 7.22 cm     TR Peak grad:   26.0 mmHg MV Decel Time: 105 msec     TR Vmax:  255.00 cm/s MV E velocity: 72.40 cm/s MV A velocity: 114.00 cm/s  SHUNTS MV E/A ratio:  0.64         Systemic VTI:  0.22 m                             Systemic Diam: 2.00 cm Dorris Carnes MD Electronically signed by Dorris Carnes MD Signature Date/Time: 01/07/2022/11:57:51 AM    Final    MR BRAIN WO CONTRAST  Result Date: 01/07/2022 CLINICAL DATA:  Follow-up examination for acute stroke. EXAM: MRI HEAD WITHOUT CONTRAST TECHNIQUE: Multiplanar, multiecho pulse sequences of the brain and surrounding structures were obtained without intravenous contrast. COMPARISON:  Prior CTs from earlier the same day. FINDINGS: Brain: Cerebral volume within normal limits. Scattered patchy T2/FLAIR hyperintensity involving the periventricular and deep white matter both cerebral  hemispheres, most consistent with chronic small vessel ischemic disease, mild to moderate in nature. 1.2 cm acute ischemic nonhemorrhagic infarcts seen involving the right thalamocapsular region (series 5, image 74). No significant mass effect. No other evidence for acute or subacute ischemia. Gray-white matter differentiation otherwise maintained. No areas of chronic cortical infarction. No acute or chronic intracranial blood products. No mass lesion, mass effect or midline shift. No hydrocephalus or extra-axial fluid collection. Pituitary gland suprasellar region within normal limits. Midline structures intact and normally formed. Vascular: Major intracranial vascular flow voids are maintained. Skull and upper cervical spine: Craniocervical junction within normal limits. Bone marrow signal intensity normal. Partial segmental fusion of the C3 and C4 vertebral bodies noted. No scalp soft tissue abnormality. Sinuses/Orbits: Prior bilateral ocular lens replacement. Paranasal sinuses are largely clear. No mastoid effusion. Other: None. IMPRESSION: 1. 1.2 cm acute ischemic nonhemorrhagic right thalamocapsular infarct. 2. Underlying mild to moderate chronic microvascular ischemic disease. Electronically Signed   By: Jeannine Boga M.D.   On: 01/07/2022 00:20   CT HEAD CODE STROKE WO CONTRAST  Result Date: 01/06/2022 CLINICAL DATA:  Neuro deficit, acute, stroke suspected EXAM: CT HEAD WITHOUT CONTRAST TECHNIQUE: Contiguous axial images were obtained from the base of the skull through the vertex without intravenous contrast. RADIATION DOSE REDUCTION: This exam was performed according to the departmental dose-optimization program which includes automated exposure control, adjustment of the mA and/or kV according to patient size and/or use of iterative reconstruction technique. COMPARISON:  04/10/2021 FINDINGS: Brain: There is no acute intracranial hemorrhage, mass effect, or edema. No new loss of gray-white  differentiation. Ventricles and sulci are stable in size and configuration. Patchy hypoattenuation in the supratentorial white matter is nonspecific but may reflect similar mild chronic microvascular ischemic changes. There is no extra-axial collection. Vascular: No hyperdense vessel. Skull: Unremarkable. Sinuses/Orbits: No acute finding. Other: Mastoid air cells are clear. ASPECTS ([place] Stroke Program Early CT Score) - Ganglionic level infarction (caudate, lentiform nuclei, internal capsule, insula, M1-M3 cortex): 7 - Supraganglionic infarction (M4-M6 cortex): 3 Total score (0-10 with 10 being normal): 10 IMPRESSION: There is no acute intracranial hemorrhage or evidence of acute infarction. Mild chronic microvascular ischemic changes. Electronically Signed   By: Macy Mis M.D.   On: 01/06/2022 18:10     PHYSICAL EXAM  Temp:  [98.1 F (36.7 C)-98.5 F (36.9 C)] 98.5 F (36.9 C) (06/14 1944) Pulse Rate:  [65-84] 65 (06/15 0403) Resp:  [12-28] 16 (06/15 0403) BP: (111-200)/(70-148) 111/70 (06/15 0403) SpO2:  [86 %-100 %] 97 % (06/15 0403)  General - Well nourished, well developed, in no apparent distress.  Ophthalmologic -  fundi not visualized due to noncooperation.  Cardiovascular - Regular rhythm and rate.  Neuro - awake, alert, eyes open, orientated to age, place, time and people. No aphasia, fluent language, following all simple commands. Able to name and repeat. No gaze palsy, tracking bilaterally, visual field full, PERRL. Mild right nasolabial fold flattening likely chronic from last stroke. Tongue midline. RUE and RLE 5/5, LUE 4/5 and LLE 4/5 with significant ataxia on the FTN and HTS on the left, sensation loss on the left UE and LE, gait not tested.    ASSESSMENT/PLAN Ms. SALENE MOHAMUD is a 66 y.o. female with history of hypertension, hyperlipidemia, diabetes, obesity, Bell's palsy, and stroke in 03/2021 admitted for left-sided numbness and weakness.  Status post  TNK.  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 Ongoing aggressive stroke risk factor management Therapy recommendations: CIR Disposition: Pending  History of stroke 03/2021 MRI showed left thalamic infarct.  CTA head and neck right PCA occlusion, left P2 moderate stenosis, left P3 occlusion.  LDL 52, A1c 6.4.  Discharged on DAPT for 3 weeks and then aspirin alone.  Also discharged on Zocor 40.  Diabetes HgbA1c 7.1 goal < 7.0 Uncontrolled CBG monitoring SSI DM education and close PCP follow up Dietitian consult for DM diet education  Hypertension Stable BP goal less than 180/105  On home lasix 20 Long term BP goal normotensive  Hyperlipidemia Home meds: Zocor 40 LDL 82, goal < 70 Now on Crestor 20 Continue statin at discharge  Other Stroke Risk Factors Advanced age Obesity, Body mass index is 42.18 kg/m.   Other Active Problems History of Bell's palsy Melanoma in 2009  Hospital day # 2  Rosalin Hawking, MD PhD Stroke Neurology 01/08/2022 8:23 AM    To contact Stroke Continuity provider, please refer to [email or web address] After hours, contact General Neurology

## 2022-01-08 NOTE — PMR Pre-admission (Signed)
PMR Admission Coordinator Pre-Admission Assessment  Patient: Shelby Morgan is an 66 y.o., female MRN: [id number] DOB: May 20, 1956 Height: 5'2" Weight: 104.6 kg  Insurance Information HMO:     PPO: Yes     PCP:       IPA:       80/20:       OTHER:  Group [id number] [place] PRIMARY: Orpah Clinton      Policy#: [id number]      Subscriber: self CM Name: Diannia Ruder       Phone#: [phone]     Fax#: [phone]  Pattricia Boss at [facility] called with approval on 6/20 for 9 days. 6/206/28 with updates due 6/64 Pre-Cert#: [id number]      Employer: Retired Benefits:  Phone #: [phone]     Name: [email or web address] portal on line Eff. Date: 11/24/21     Deduct: $0      Out of Pocket Max: $4500 (met $0)      Life Max: N/A CIR: $295 days 1-6 with max $1770/admission      SNF: $0 days 1-20; $196 days 21-100 Outpatient: med nec     Co-Pay: $35/visit Home Health: 100%      Co-Pay: none DME: 80%     Co-Pay: 20% Providers: in network  SECONDARY:       Policy#:      Phone#:   Artist:       Phone#:   The Data processing manager" for patients in Inpatient Rehabilitation Facilities with attached "Privacy Act Statement-Health Care Records" was provided and verbally reviewed with: Patient  Emergency Contact Information Contact Information     Name Relation Home Work Mobile   Tate,Betty Mother [phone]  [phone]       Current Medical History  Patient Admitting Diagnosis: R CVA  History of Present Illness: Pt is a 66 y/o female admitted 6/13 with Left sided numbness and weakness.  MRI shows a 1.2 cm acute right thalamocapsular infarct--non hemorrhagic.Marland Kitchen  PMHx Bells palsy, HLD, DM, HTN, melanoma, Left thalamic stroke 2022, NAFLD  Complete NIHSS TOTAL: 5  Patient's medical record from [facility] health has been reviewed by the rehabilitation admission coordinator and physician.  Past Medical History  Past Medical History:  Diagnosis Date   Anxiety    Bell's palsy 01/08/2011   Chronic back  pain    Depression    Diabetes mellitus    GERD (gastroesophageal reflux disease)    Hepatomegaly    Hypertension    Melanoma (HCC) 2009   Dr Margo Aye, Stage 2, required only surgery   NAFLD (nonalcoholic fatty liver disease)    Skin cancer    Stroke (HCC) 07/2021    Has the patient had major surgery during 100 days prior to admission? No  Family History   family history includes Bladder Cancer in her father; COPD in her father; Heart disease in her maternal grandfather, maternal grandmother, and mother.  Current Medications  Current Facility-Administered Medications:    acetaminophen (TYLENOL) tablet 650 mg, 650 mg, Oral, Q4H PRN, 650 mg at 01/07/22 2248 **OR** acetaminophen (TYLENOL) 160 MG/5ML solution 650 mg, 650 mg, Per Tube, Q4H PRN **OR** acetaminophen (TYLENOL) suppository 650 mg, 650 mg, Rectal, Q4H PRN, Caryl Pina, MD   aspirin EC tablet 81 mg, 81 mg, Oral, Daily, Marvel Plan, MD, 81 mg at 01/08/22 0854   busPIRone (BUSPAR) tablet 5 mg, 5 mg, Oral, BID, Caryl Pina, MD, 5 mg at 01/08/22 0855   Chlorhexidine Gluconate Cloth 2 % PADS 6 each, 6 each,  Topical, Daily, Coralyn Helling, MD, 6 each at 01/07/22 2238   clopidogrel (PLAVIX) tablet 75 mg, 75 mg, Oral, Daily, Marvel Plan, MD, 75 mg at 01/08/22 0855   enoxaparin (LOVENOX) injection 40 mg, 40 mg, Subcutaneous, Q24H, Marvel Plan, MD, 40 mg at 01/08/22 0854   furosemide (LASIX) tablet 20 mg, 20 mg, Oral, q morning, Caryl Pina, MD, 20 mg at 01/08/22 0855   insulin aspart (novoLOG) injection 0-20 Units, 0-20 Units, Subcutaneous, TID WC, Caryl Pina, MD, 4 Units at 01/08/22 1200   labetalol (NORMODYNE) injection 10-20 mg, 10-20 mg, Intravenous, Q2H PRN, Marvel Plan, MD   pantoprazole (PROTONIX) EC tablet 40 mg, 40 mg, Oral, Daily, Marvel Plan, MD, 40 mg at 01/08/22 0855   rosuvastatin (CRESTOR) tablet 20 mg, 20 mg, Oral, Daily, Marvel Plan, MD, 20 mg at 01/08/22 0855   senna-docusate (Senokot-S) tablet 1 tablet, 1 tablet,  Oral, QHS PRN, Caryl Pina, MD   traZODone (DESYREL) tablet 100 mg, 100 mg, Oral, QHS PRN, Caryl Pina, MD  Patients Current Diet:  Diet Order             Diet heart healthy/carb modified Room service appropriate? No; Fluid consistency: Thin  Diet effective now                   Precautions / Restrictions Precautions Precautions: Fall Precaution Comments: BP parameters 180/105 Restrictions Weight Bearing Restrictions: Yes LLE Weight Bearing: Non weight bearing   Has the patient had 2 or more falls or a fall with injury in the past year? No  Prior Activity Level Limited Community (1-2x/wk): Micah Flesher out a couple times a week.  Was driving.  Prior Functional Level Self Care: Did the patient need help bathing, dressing, using the toilet or eating? Independent  Indoor Mobility: Did the patient need assistance with walking from room to room (with or without device)? Independent  Stairs: Did the patient need assistance with internal or external stairs (with or without device)? Independent  Functional Cognition: Did the patient need help planning regular tasks such as shopping or remembering to take medications? Independent  Patient Information Are you of Hispanic, Latino/a,or Spanish origin?: A. No, not of Hispanic, Latino/a, or Spanish origin What is your race?: A. White Do you need or want an interpreter to communicate with a doctor or health care staff?: 0. No  Patient's Response To:  Health Literacy and Transportation Is the patient able to respond to health literacy and transportation needs?: Yes Health Literacy - How often do you need to have someone help you when you read instructions, pamphlets, or other written material from your doctor or pharmacy?: Never In the past 12 months, has lack of transportation kept you from medical appointments or from getting medications?: No In the past 12 months, has lack of transportation kept you from meetings, work, or from getting  things needed for daily living?: No  Home Assistive Devices / Equipment Home Equipment: Grab bars - tub/shower, Agricultural consultant (2 wheels), [employer] - single point  Prior Device Use: Indicate devices/aids used by the patient prior to current illness, exacerbation or injury? None of the above  Current Functional Level Cognition  Arousal/Alertness: Awake/alert Overall Cognitive Status: Impaired/Different from baseline Current Attention Level: Sustained Orientation Level: Oriented X4 Following Commands: Follows one step commands consistently, Follows one step commands with increased time, Follows multi-step commands inconsistently Safety/Judgement: Decreased awareness of safety, Decreased awareness of deficits General Comments: pt fearful of falling, slightly impulsive with difficulty following mulitple step commands  and sequencing functionally Attention: Focused, Sustained Focused Attention: Appears intact Sustained Attention: Appears intact Memory: Impaired Memory Impairment:  (Immediate: 5/5; delayed: 5/5; paragraph: 6/8) Awareness: Appears intact Problem Solving: Impaired Problem Solving Impairment: Verbal complex (Money: 1/3; time: 1/1) Executive Function: Sequencing, Occupational hygienist: Impaired Sequencing Impairment: Verbal complex (clock drawing: 2/4 with self-correction) Organizing: Impaired Organizing Impairment: Verbal complex (backward digit span: 1/2)    Extremity Assessment (includes Sensation/Coordination)  Upper Extremity Assessment: Defer to OT evaluation RUE Deficits / Details: hx of CVA with weakness 3/5 MMT, slow movements but coordiated RUE Sensation: decreased light touch RUE Coordination: WNL LUE Deficits / Details: grosly 3-/5 MMT, poor coordination, proprioception and no sensation. pt using functionally during ADL tasks, but pt unable to control UE at times mobility tasks LUE Sensation: decreased light touch, decreased proprioception LUE Coordination:  decreased fine motor, decreased gross motor  Lower Extremity Assessment: LLE deficits/detail, RLE deficits/detail RLE Deficits / Details: functional, but mild weakness, pt able to isolate movements LLE Deficits / Details: grossly weak at 3/5, uncoodinated with absence of LT/pressure and pain. LLE Sensation: decreased light touch, decreased proprioception LLE Coordination: decreased fine motor, decreased gross motor    ADLs  Overall ADL's : Needs assistance/impaired Grooming: Minimal assistance, Sitting Grooming Details (indicate cue type and reason): able to wash face with R UE, wash hands with increased time but would require assist with manging toothpaste Upper Body Dressing : Moderate assistance, Sitting Lower Body Dressing: +2 for physical assistance, +2 for safety/equipment, Total assistance, Sit to/from stand Toilet Transfer: Maximal assistance, +2 for physical assistance, +2 for safety/equipment Toilet Transfer Details (indicate cue type and reason): simulated side stepping towards [facility] Toileting- Clothing Manipulation and Hygiene: Total assistance, +2 for physical assistance, +2 for safety/equipment Functional mobility during ADLs: Moderate assistance, +2 for physical assistance, +2 for safety/equipment, Minimal assistance    Mobility  Overal bed mobility: Needs Assistance Bed Mobility: Supine to Sit, Sit to Supine Supine to sit: Min assist Sit to supine: Mod assist General bed mobility comments: min assist to elevated trunk and sequence to EOB, BL E support and cueing for sequencing once returned to supine    Transfers  Overall transfer level: Needs assistance Equipment used: 2 person hand held assist Transfers: Sit to/from Stand Sit to Stand: Mod assist, Min assist, +2 physical assistance, +2 safety/equipment General transfer comment: min assist +2 to power up and steady from EOB, dynamically at EOB requires mod assist +2    Ambulation / Gait / Stairs / Wheelchair Mobility   Ambulation/Gait General Gait Details: NT today    Posture / Balance Dynamic Sitting Balance Sitting balance - Comments: min assist to min guard dynamically for safety Balance Overall balance assessment: Needs assistance Sitting-balance support: No upper extremity supported, Feet supported Sitting balance-Leahy Scale: Fair Sitting balance - Comments: min assist to min guard dynamically for safety Standing balance support: Bilateral upper extremity supported, During functional activity Standing balance-Leahy Scale: Poor Standing balance comment: relies on BUE and external support    Special needs/care consideration Diabetic management Yes h/o DM   Previous Home Environment (from acute therapy documentation) Living Arrangements: Alone  Lives With: Alone Available Help at Discharge: Family Type of Home: House Home Layout: One level Home Access: Stairs to enter Entrance Stairs-Rails: Can reach both Entrance Stairs-Number of Steps: 7 Bathroom Shower/Tub: Engineer, manufacturing systems: Standard  Discharge Living Setting Plans for Discharge Living Setting: Patient's home, House, Alone (Lives alone.  Son lives out of town.) Type of Home at Discharge:  House Discharge Home Layout: One level Discharge Home Access: Stairs to enter Entrance Stairs-Rails: Right, Left Entrance Stairs-Number of Steps: 4 at the front and 7 at the back entry. Discharge Bathroom Shower/Tub: Tub/shower unit, Curtain Discharge Bathroom Toilet: Standard Discharge Bathroom Accessibility: Yes How Accessible: Accessible via walker  Social/Family/Support Systems Patient Roles: Parent (Has son who lives out of town.) Contact Information: Lonia Skinner - mother - [phone] Anticipated Caregiver: Family working on who can provide care Ability/Limitations of Caregiver: Son lives out of town.  Mother is elderly but does drive. Caregiver Availability: Other (Comment) (Patient and son aware of 24/7 supervision needed  after D/C) Discharge Plan Discussed with Primary Caregiver: Yes Is Caregiver In Agreement with Plan?: Yes Does Caregiver/Family have Issues with Lodging/Transportation while Pt is in Rehab?: No  Goals Patient/Family Goal for Rehab: PT/OT mod I and supervision goals Expected length of stay: 10-12 days Pt/Family Agrees to Admission and willing to participate: Yes Program Orientation Provided & Reviewed with Pt/Caregiver Including Roles  & Responsibilities: Yes  Decrease burden of Care through IP rehab admission: N/A  Possible need for SNF placement upon discharge: Not anticipated  Patient Condition: I have reviewed medical records from [facility] health, spoken with CSW, and patient, son, and family member. I met with patient at the bedside for inpatient rehabilitation assessment.  Patient will benefit from ongoing PT, OT, and SLP, can actively participate in 3 hours of therapy a day 5 days of the week, and can make measurable gains during the admission.  Patient will also benefit from the coordinated team approach during an Inpatient Acute Rehabilitation admission.  The patient will receive intensive therapy as well as Rehabilitation physician, nursing, social worker, and care management interventions.  Due to bladder management, bowel management, safety, skin/wound care, disease management, medication administration, pain management, and patient education the patient requires 24 hour a day rehabilitation nursing.  The patient is currently *** with mobility and basic ADLs.  Discharge setting and therapy post discharge at home with home health is anticipated.  Patient has agreed to participate in the Acute Inpatient Rehabilitation Program and will admit {Time; today/tomorrow:10263}.  Preadmission Screen Completed By:  Trish Mage, 01/08/2022 2:04 PM ______________________________________________________________________   Discussed status with Dr. Marland Kitchen on *** at *** and received approval for admission  today.  Admission Coordinator:  Trish Mage, RN, time Marland KitchenDorna Bloom ***   Assessment/Plan: Diagnosis: Does the need for close, 24 hr/day Medical supervision in concert with the patient's rehab needs make it unreasonable for this patient to be served in a less intensive setting? {yes_no_potentially:3041433} Co-Morbidities requiring supervision/potential complications: *** Due to {due UJ:8119147}, does the patient require 24 hr/day rehab nursing? {yes_no_potentially:3041433} Does the patient require coordinated care of a physician, rehab nurse, PT, OT, and SLP to address physical and functional deficits in the context of the above medical diagnosis(es)? {yes_no_potentially:3041433} Addressing deficits in the following areas: {deficits:3041436} Can the patient actively participate in an intensive therapy program of at least 3 hrs of therapy 5 days a week? {yes_no_potentially:3041433} The potential for patient to make measurable gains while on inpatient rehab is {potential:3041437} Anticipated functional outcomes upon discharge from inpatient rehab: {functional outcomes:304600100} PT, {functional outcomes:304600100} OT, {functional outcomes:304600100} SLP Estimated rehab length of stay to reach the above functional goals is: *** Anticipated discharge destination: {anticipated dc setting:21604} 10. Overall Rehab/Functional Prognosis: {potential:3041437}   MD Signature: ***

## 2022-01-09 DIAGNOSIS — Z8673 Personal history of transient ischemic attack (TIA), and cerebral infarction without residual deficits: Secondary | ICD-10-CM | POA: Diagnosis not present

## 2022-01-09 DIAGNOSIS — I639 Cerebral infarction, unspecified: Secondary | ICD-10-CM | POA: Diagnosis not present

## 2022-01-09 LAB — BASIC METABOLIC PANEL
Anion gap: 12 (ref 5–15)
BUN: 19 mg/dL (ref 8–23)
CO2: 27 mmol/L (ref 22–32)
Calcium: 9.5 mg/dL (ref 8.9–10.3)
Chloride: 101 mmol/L (ref 98–111)
Creatinine, Ser: 1.04 mg/dL — ABNORMAL HIGH (ref 0.44–1.00)
GFR, Estimated: 60 mL/min — ABNORMAL LOW (ref >60)
Glucose, Bld: 173 mg/dL — ABNORMAL HIGH (ref 70–99)
Potassium: 4.3 mmol/L (ref 3.5–5.1)
Sodium: 140 mmol/L (ref 135–145)

## 2022-01-09 LAB — GLUCOSE, CAPILLARY
Glucose-Capillary: 180 mg/dL — ABNORMAL HIGH (ref 70–99)
Glucose-Capillary: 199 mg/dL — ABNORMAL HIGH (ref 70–99)
Glucose-Capillary: 142 mg/dL — ABNORMAL HIGH (ref 70–99)
Glucose-Capillary: 167 mg/dL — ABNORMAL HIGH (ref 70–99)
Glucose-Capillary: 183 mg/dL — ABNORMAL HIGH (ref 70–99)

## 2022-01-09 LAB — CBC
HCT: 38.5 % (ref 36.0–46.0)
Hemoglobin: 12.3 g/dL (ref 12.0–15.0)
MCH: 27.1 pg (ref 26.0–34.0)
MCHC: 31.9 g/dL (ref 30.0–36.0)
MCV: 84.8 fL (ref 80.0–100.0)
Platelets: 362 10*3/uL (ref 150–400)
RBC: 4.54 MIL/uL (ref 3.87–5.11)
RDW: 13.6 % (ref 11.5–15.5)
WBC: 10.5 10*3/uL (ref 4.0–10.5)
nRBC: 0 % (ref 0.0–0.2)

## 2022-01-09 NOTE — Progress Notes (Addendum)
Occupational Therapy Treatment Patient Details Name: Shelby Morgan MRN: [id number] DOB: 07/15/1956 Today's Date: 01/09/2022   History of present illness pt is a 66 y/o female admitted 6/13 with Left sided numbness and weakness.  MRI shows a 1.2 cm acute right thalamocapsular infarct--non hemorrhagic.Marland Kitchen  PMHx Bells palsy, HLD, DM, HTN, melanoma, Left thalamic stroke 2022, NAFLD   OT comments  Pt supine in bed and agreeable to OT/PT session.  Demonstrating improved coordination in L UE but remains limited by impaired sensation and proprioception.  Reviewed exercises and use of R UE to guide L UE during functional tasks or exercises.  Patient completing bed mobility with min assist, transfers with min-mod assist +2, and LB dressing with mod assist +2.  Able to don socks today but requires +2 in standing and relies on BUE support.  Difficulty following multiple step commands and attention functionally. She is highly motivated and eager to get to rehab.     Recommendations for follow up therapy are one component of a multi-disciplinary discharge planning process, led by the attending physician.  Recommendations may be updated based on patient status, additional functional criteria and insurance authorization.    Follow Up Recommendations  Acute inpatient rehab (3hours/day)    Assistance Recommended at Discharge Frequent or constant Supervision/Assistance  Patient can return home with the following  Two people to help with walking and/or transfers;Two people to help with bathing/dressing/bathroom;Direct supervision/assist for medications management;Assistance with cooking/housework;Direct supervision/assist for financial management;Assist for transportation;Help with stairs or ramp for entrance   Equipment Recommendations  Other (comment) (defer)    Recommendations for Other Services Rehab consult    Precautions / Restrictions Precautions Precautions: Fall Restrictions Weight Bearing  Restrictions: No       Mobility Bed Mobility Overal bed mobility: Needs Assistance Bed Mobility: Supine to Sit     Supine to sit: Min assist     General bed mobility comments: for trunk support    Transfers Overall transfer level: Needs assistance Equipment used: 2 person hand held assist Transfers: Sit to/from Stand Sit to Stand: Mod assist, Min assist, +2 physical assistance, +2 safety/equipment           General transfer comment: mod assist +2 from EOB progressing to min assist +2 from arm chair. cueing for hand placement and forward lean/technique     Balance Overall balance assessment: Needs assistance Sitting-balance support: No upper extremity supported, Feet supported Sitting balance-Leahy Scale: Fair     Standing balance support: Bilateral upper extremity supported, During functional activity Standing balance-Leahy Scale: Poor Standing balance comment: relies on BUE and external support with L lateral lean when fatigued or distracted                           ADL either performed or assessed with clinical judgement   ADL Overall ADL's : Needs assistance/impaired Eating/Feeding: Minimal assistance Eating/Feeding Details (indicate cue type and reason): pt requires cueing to utilize L hand to hold cup, educated on guiding cup with R hand.                 Lower Body Dressing: Moderate assistance;+2 for physical assistance;+2 for safety/equipment;Sit to/from stand Lower Body Dressing Details (indicate cue type and reason): able to manage socks at EOB with min guard, mod asisst +2 in standing Toilet Transfer: Moderate assistance;+2 for physical assistance;+2 for safety/equipment;Ambulation   Toileting- Clothing Manipulation and Hygiene: Total assistance;Sit to/from stand  Functional mobility during ADLs: Moderate assistance;+2 for physical assistance;+2 for safety/equipment;Minimal assistance General ADL Comments: relies on BUE support  in standing    Extremity/Trunk Assessment              Vision       Perception     Praxis      Cognition Arousal/Alertness: Awake/alert Behavior During Therapy: WFL for tasks assessed/performed Overall Cognitive Status: Impaired/Different from baseline Area of Impairment: Following commands, Awareness, Problem solving, Attention, Safety/judgement                   Current Attention Level: Sustained   Following Commands: Follows one step commands consistently, Follows one step commands with increased time, Follows multi-step commands inconsistently Safety/Judgement: Decreased awareness of safety Awareness: Emergent Problem Solving: Slow processing, Requires verbal cues, Difficulty sequencing, Requires tactile cues General Comments: pt with decreased attention and sequnecing, requiring increased cueing functionally        Exercises      Shoulder Instructions       General Comments VSS    Pertinent Vitals/ Pain       Pain Assessment Pain Assessment: Faces Faces Pain Scale: No hurt Pain Intervention(s): Monitored during session, Repositioned  Home Living                                          Prior Functioning/Environment              Frequency  Min 2X/week        Progress Toward Goals  OT Goals(current goals can now be found in the care plan section)  Progress towards OT goals: Progressing toward goals  Acute Rehab OT Goals Patient Stated Goal: get to rehab OT Goal Formulation: With patient Time For Goal Achievement: 01/21/22 Potential to Achieve Goals: Good  Plan Discharge plan remains appropriate;Frequency remains appropriate    Co-evaluation    PT/OT/SLP Co-Evaluation/Treatment: Yes Reason for Co-Treatment: For patient/therapist safety;To address functional/ADL transfers   OT goals addressed during session: ADL's and self-care      AM-PAC OT "6 Clicks" Daily Activity     Outcome Measure   Help from  another person eating meals?: A Little Help from another person taking care of personal grooming?: A Little Help from another person toileting, which includes using toliet, bedpan, or urinal?: Total Help from another person bathing (including washing, rinsing, drying)?: A Lot Help from another person to put on and taking off regular upper body clothing?: A Lot Help from another person to put on and taking off regular lower body clothing?: A Lot 6 Click Score: 13    End of Session Equipment Utilized During Treatment: Gait belt  OT Visit Diagnosis: Other abnormalities of gait and mobility (R26.89);Other symptoms and signs involving cognitive function   Activity Tolerance Patient tolerated treatment well   Patient Left in chair;with call bell/phone within reach;with chair alarm set   Nurse Communication Mobility status        Time: 6384-6659 OT Time Calculation (min): 33 min  Charges: OT General Charges $OT Visit: 1 Visit OT Treatments $Self Care/Home Management : 8-22 mins  Jolaine Artist, OT Acute Rehabilitation Services Office [phone]   Delight Stare 01/09/2022, 1:50 PM

## 2022-01-09 NOTE — Plan of Care (Signed)
Pt has been in the chair this evening. RN used a stedy to the [facility]. Pt had a BM. Purewick in place.  Problem: Education: Goal: Knowledge of General Education information will improve Description: Including pain rating scale, medication(s)/side effects and non-pharmacologic comfort measures Outcome: Progressing   Problem: Health Behavior/Discharge Planning: Goal: Ability to manage health-related needs will improve Outcome: Progressing   Problem: Clinical Measurements: Goal: Ability to maintain clinical measurements within normal limits will improve Outcome: Progressing Goal: Will remain free from infection Outcome: Progressing Goal: Diagnostic test results will improve Outcome: Progressing Goal: Respiratory complications will improve Outcome: Progressing Goal: Cardiovascular complication will be avoided Outcome: Progressing   Problem: Activity: Goal: Risk for activity intolerance will decrease Outcome: Progressing   Problem: Nutrition: Goal: Adequate nutrition will be maintained Outcome: Progressing   Problem: Coping: Goal: Level of anxiety will decrease Outcome: Progressing   Problem: Elimination: Goal: Will not experience complications related to bowel motility Outcome: Progressing Goal: Will not experience complications related to urinary retention Outcome: Progressing   Problem: Pain Managment: Goal: General experience of comfort will improve Outcome: Progressing   Problem: Safety: Goal: Ability to remain free from injury will improve Outcome: Progressing   Problem: Skin Integrity: Goal: Risk for impaired skin integrity will decrease Outcome: Progressing   Problem: Education: Goal: Knowledge of secondary prevention will improve ([facility]) Outcome: Progressing   Problem: Education: Goal: Ability to describe self-care measures that may prevent or decrease complications (Diabetes Survival Skills Education) will improve Outcome: Progressing Goal:  Individualized Educational Video(s) Outcome: Progressing   Problem: Coping: Goal: Ability to adjust to condition or change in health will improve Outcome: Progressing   Problem: Fluid Volume: Goal: Ability to maintain a balanced intake and output will improve Outcome: Progressing   Problem: Health Behavior/Discharge Planning: Goal: Ability to identify and utilize available resources and services will improve Outcome: Progressing Goal: Ability to manage health-related needs will improve Outcome: Progressing   Problem: Metabolic: Goal: Ability to maintain appropriate glucose levels will improve Outcome: Progressing   Problem: Nutritional: Goal: Maintenance of adequate nutrition will improve Outcome: Progressing Goal: Progress toward achieving an optimal weight will improve Outcome: Progressing   Problem: Skin Integrity: Goal: Risk for impaired skin integrity will decrease Outcome: Progressing   Problem: Tissue Perfusion: Goal: Adequacy of tissue perfusion will improve Outcome: Progressing

## 2022-01-09 NOTE — Progress Notes (Signed)
Physical Therapy Treatment Patient Details Name: Shelby Morgan MRN: [id number] DOB: 11-26-1955 Today's Date: 01/09/2022   History of Present Illness pt is a 66 y/o female admitted 6/13 with Left sided numbness and weakness.  MRI shows a 1.2 cm acute right thalamocapsular infarct--non hemorrhagic.Marland Kitchen  PMHx Bells palsy, HLD, DM, HTN, melanoma, Left thalamic stroke 2022, NAFLD    PT Comments    Patient agreeable to PT/OT co-treatment. The patient continues to have sensory and coordination deficits on LLE that impact functional independence. She required +2 person assistance for transfers from multiple surfaces. And +2 person assistance also required with ambulation 10 ft x 2 bouts with seated rest break between. Ataxic gait pattern with varying step length of LLE despite cues. The patient remains highly motivated and eager to get to inpatient rehab which is recommended at discharge. PT will continue to follow to maximize independence and decrease caregiver burden.    Recommendations for follow up therapy are one component of a multi-disciplinary discharge planning process, led by the attending physician.  Recommendations may be updated based on patient status, additional functional criteria and insurance authorization.  Follow Up Recommendations  Acute inpatient rehab (3hours/day)     Assistance Recommended at Discharge Frequent or constant Supervision/Assistance  Patient can return home with the following A little help with bathing/dressing/bathroom;Assistance with cooking/housework;Direct supervision/assist for medications management;Direct supervision/assist for financial management;Assist for transportation;Help with stairs or ramp for entrance;A lot of help with walking and/or transfers   Equipment Recommendations  Other (comment) (to be determined at next venue of care)    Recommendations for Other Services Rehab consult     Precautions / Restrictions Precautions Precautions:  Fall Restrictions Weight Bearing Restrictions: No     Mobility  Bed Mobility Overal bed mobility: Needs Assistance Bed Mobility: Supine to Sit     Supine to sit: Min assist     General bed mobility comments: trunk support provided    Transfers Overall transfer level: Needs assistance Equipment used: 2 person hand held assist   Sit to Stand: Mod assist, +2 physical assistance, Min assist           General transfer comment: maximal cues including hand over hand for LUE placement with transfers as well as positioning of LLE. with standing from the hospital bed, patient required the most support of Mod A +2 person. she required Min A +2 for standing from a chair with arms.    Ambulation/Gait Ambulation/Gait assistance: Mod assist, +2 physical assistance Gait Distance (Feet): 10 Feet (x 2) Assistive device: 2 person hand held assist Gait Pattern/deviations: Ataxic, Decreased stride length, Decreased weight shift to right     Pre-gait activities: left knee buckling noted x 1 bout initially after standing. weight shifting performed to right and left to assess readiness for ambulation with no knee buckling further. General Gait Details: patient with varying step length on LLE with cues required for safety and appropriate step length. faciliation for weight shifting to right with left lean more pronounced with fatigue. intermittent standing rest breaks required for short distance ambulation. consider chair follow for safety   Stairs             Wheelchair Mobility    Modified Rankin (Stroke Patients Only)       Balance Overall balance assessment: Needs assistance Sitting-balance support: No upper extremity supported, Feet supported Sitting balance-Leahy Scale: Fair     Standing balance support: Bilateral upper extremity supported, During functional activity Standing balance-Leahy Scale: Poor Standing balance  comment: external support required for left lean. this  is more pronounced with fatigue and distraction                            Cognition Arousal/Alertness: Awake/alert Behavior During Therapy: WFL for tasks assessed/performed Overall Cognitive Status: Within Functional Limits for tasks assessed Area of Impairment: Following commands, Awareness, Problem solving, Attention, Safety/judgement                   Current Attention Level: Sustained   Following Commands: Follows one step commands consistently, Follows one step commands with increased time, Follows multi-step commands inconsistently Safety/Judgement: Decreased awareness of safety Awareness: Emergent Problem Solving: Slow processing, Requires verbal cues, Difficulty sequencing, Requires tactile cues General Comments: easily distracted at times and required cues for attention to task        Exercises      General Comments General comments (skin integrity, edema, etc.): VSS      Pertinent Vitals/Pain Pain Assessment Pain Assessment: Faces Faces Pain Scale: No hurt Pain Intervention(s): Monitored during session    Home Living                          Prior Function            PT Goals (current goals can now be found in the care plan section) Acute Rehab PT Goals Patient Stated Goal: to regain independence PT Goal Formulation: With patient Time For Goal Achievement: 01/21/22 Potential to Achieve Goals: Good Progress towards PT goals: Progressing toward goals    Frequency    Min 4X/week      PT Plan Current plan remains appropriate    Co-evaluation   Reason for Co-Treatment: For patient/therapist safety;To address functional/ADL transfers PT goals addressed during session: Mobility/safety with mobility OT goals addressed during session: ADL's and self-care      AM-PAC PT "6 Clicks" Mobility   Outcome Measure  Help needed turning from your back to your side while in a flat bed without using bedrails?: A Lot Help needed  moving from lying on your back to sitting on the side of a flat bed without using bedrails?: A Lot Help needed moving to and from a bed to a chair (including a wheelchair)?: Total Help needed standing up from a chair using your arms (e.g., wheelchair or bedside chair)?: Total Help needed to walk in hospital room?: Total Help needed climbing 3-5 steps with a railing? : Total 6 Click Score: 8    End of Session Equipment Utilized During Treatment: Gait belt Activity Tolerance: Patient tolerated treatment well Patient left: in chair;with call bell/phone within reach;with chair alarm set   PT Visit Diagnosis: Other abnormalities of gait and mobility (R26.89);Hemiplegia and hemiparesis;Other symptoms and signs involving the nervous system (R29.898)     Time: 3614-4315 PT Time Calculation (min) (ACUTE ONLY): 33 min  Charges:  $Gait Training: 8-22 mins                    Minna Merritts, PT, MPT    Percell Locus 01/09/2022, 2:10 PM

## 2022-01-09 NOTE — Plan of Care (Signed)

## 2022-01-09 NOTE — Discharge Instructions (Signed)

## 2022-01-09 NOTE — Progress Notes (Signed)
  RD consulted for nutrition education regarding diabetes.   Lab Results  Component Value Date   HGBA1C 7.1 (H) 01/07/2022   Pt resting in chair at the time of visit with several family members at bedside. Discussed different food groups and their effects on blood sugar, emphasizing carbohydrate-containing foods. Provided list of carbohydrates and recommended serving sizes of common foods.  Discussed importance of controlled and consistent carbohydrate intake throughout the day. Provided examples of ways to balance meals/snacks and encouraged intake of high-fiber, whole grain complex carbohydrates. Teach back method used.  RD provided "Carbohydrate Counting for People with Diabetes" handout from the Academy of [employer] Dietetics in AVS for review at home.   Expect good compliance.  Body mass index is 42.18 kg/m. Pt meets criteria for obesity class III based on current BMI.  Current diet order is crab modified/heart healthy, patient is consuming approximately 96% of meals at this time. Labs and medications reviewed. No further nutrition interventions warranted at this time. RD contact information provided. If additional nutrition issues arise, please re-consult RD.  Ranell Patrick, RD, LDN Clinical Dietitian RD pager # available in [place]  After hours/weekend pager # available in [facility]

## 2022-01-09 NOTE — Care Management Important Message (Signed)
Important Message  Patient Details  Name: Shelby Morgan MRN: [id number] Date of Birth: Aug 12, 1955   Medicare Important Message Given:  Yes     Orbie Pyo 01/09/2022, 9:44 AM

## 2022-01-09 NOTE — Progress Notes (Addendum)
STROKE TEAM PROGRESS NOTE   SUBJECTIVE (INTERVAL HISTORY)  She is awake and alert sitting up in bed in NAD. RN at bedside. She c/o headache this am and high blood pressure and high glucose. Headache has resolved since taking Tylenol. She is alert and oriented x4, mild dysarthria, mild left facial droop. Left arm and left lower 4/5, right upper and lower 5/5. Decreased sensation on left face, arm and lower. Awaiting insurance approval for CIR.   OBJECTIVE Temp:  [98.3 F (36.8 C)-99.3 F (37.4 C)] 98.6 F (37 C) (06/16 0755) Pulse Rate:  [69-100] 69 (06/16 0755) Cardiac Rhythm: Normal sinus rhythm (06/16 0740) Resp:  [13-26] 18 (06/16 0755) BP: (155-175)/(68-101) 163/91 (06/16 0755) SpO2:  [91 %-98 %] 98 % (06/16 0755)  Recent Labs  Lab 01/08/22 1154 01/08/22 1605 01/08/22 2158 01/09/22 0411 01/09/22 0934  GLUCAP 182* 192* 175* 180* 199*    Recent Labs  Lab 01/06/22 1821 01/06/22 1831 01/08/22 0904 01/09/22 0226  NA 137 140 142 140  K 3.6 3.7 3.9 4.3  CL 105 102 103 101  CO2 24  --  26 27  GLUCOSE 205* 205* 215* 173*  BUN '14 13 14 19  '$ CREATININE 0.91 0.80 0.95 1.04*  CALCIUM 9.0  --  9.6 9.5    Recent Labs  Lab 01/06/22 1821  AST 36  ALT 38  ALKPHOS 61  BILITOT 0.7  PROT 7.7  ALBUMIN 3.8    Recent Labs  Lab 01/06/22 1821 01/06/22 1831 01/08/22 0904 01/09/22 0226  WBC 8.3  --  8.0 10.5  NEUTROABS 5.1  --   --   --   HGB 13.1 13.6 12.7 12.3  HCT 39.7 40.0 39.2 38.5  MCV 83.9  --  84.7 84.8  PLT 389  --  345 362    No results for input(s): "CKTOTAL", "CKMB", "CKMBINDEX", "TROPONINI" in the last 168 hours. Recent Labs    01/06/22 1821  LABPROT 12.6  INR 1.0    Recent Labs    01/06/22 1927  COLORURINE YELLOW  LABSPEC 1.005  PHURINE 6.0  GLUCOSEU NEGATIVE  HGBUR NEGATIVE  BILIRUBINUR NEGATIVE  KETONESUR NEGATIVE  PROTEINUR NEGATIVE  NITRITE NEGATIVE  LEUKOCYTESUR NEGATIVE        Component Value Date/Time   CHOL 156 01/07/2022 0418    TRIG 107 01/07/2022 0418   HDL 53 01/07/2022 0418   CHOLHDL 2.9 01/07/2022 0418   VLDL 21 01/07/2022 0418   LDLCALC 82 01/07/2022 0418   Lab Results  Component Value Date   HGBA1C 7.1 (H) 01/07/2022      Component Value Date/Time   LABOPIA NONE DETECTED 01/06/2022 1927   COCAINSCRNUR NONE DETECTED 01/06/2022 1927   LABBENZ NONE DETECTED 01/06/2022 1927   AMPHETMU NONE DETECTED 01/06/2022 1927   THCU NONE DETECTED 01/06/2022 1927   LABBARB NONE DETECTED 01/06/2022 1927    Recent Labs  Lab 01/06/22 1821  ETH <10     I have personally reviewed the radiological images below and agree with the radiology interpretations.  CT ANGIO HEAD NECK W WO CM  Result Date: 01/08/2022 CLINICAL DATA:  Follow-up examination for acute stroke. EXAM: CT ANGIOGRAPHY HEAD AND NECK TECHNIQUE: Multidetector CT imaging of the head and neck was performed using the standard protocol during bolus administration of intravenous contrast. Multiplanar CT image reconstructions and MIPs were obtained to evaluate the vascular anatomy. Carotid stenosis measurements (when applicable) are obtained utilizing NASCET criteria, using the distal internal carotid diameter as the denominator. RADIATION DOSE  REDUCTION: This exam was performed according to the departmental dose-optimization program which includes automated exposure control, adjustment of the mA and/or kV according to patient size and/or use of iterative reconstruction technique. CONTRAST:  83m OMNIPAQUE IOHEXOL 350 MG/ML SOLN COMPARISON:  Prior MRI from 01/06/2022 as well as prior CTA from 04/10/2021 and chest CT from 06/06/2018. FINDINGS: CT HEAD FINDINGS Brain: There has been continued interval evolution of previously identified acute infarct involving the right thalamic capsular region, stable in size as compared to previous MRI. No associated mass effect or evidence for hemorrhagic transformation. No other acute intracranial hemorrhage or large vessel territory  infarct. Underlying atrophy with mild chronic small vessel ischemic disease noted. Small remote left thalamic lacunar infarct noted. No mass lesion or midline shift. No hydrocephalus or extra-axial fluid collection. Vascular: No hyperdense vessel. Scattered vascular calcifications noted within the carotid siphons. Skull: Scalp soft tissues and calvarium within normal limits. Sinuses: Paranasal sinuses are largely clear.  No mastoid effusion. Orbits: Globes orbital soft tissues demonstrate no acute finding. Review of the MIP images confirms the above findings CTA NECK FINDINGS Aortic arch: Visualized aortic arch normal in caliber with standard 3 vessel branching pattern. Mild plaque within the arch itself. No stenosis about the origin of the great vessels. Right carotid system: Right common and internal carotid arteries patent without stenosis or dissection. Mild for age plaque about the right carotid bulb without stenosis. Left carotid system: Left common and internal carotid arteries patent without stenosis or dissection. Mild for age plaque about the left carotid bulb without significant stenosis. Vertebral arteries: Both vertebral arteries arise from the subclavian arteries. No proximal subclavian artery stenosis. Both vertebral arteries widely patent without stenosis, dissection or occlusion. Skeleton: No discrete or worrisome osseous lesions. Segmental fusion of the C3 and C4 vertebral bodies noted. Moderate spondylosis noted at C5-6 and C6-7. Other neck: No other acute soft tissue abnormality within the neck. 8 mm soft tissue nodule present at the superior aspect left parotid gland, indeterminate. Few additional scattered subcentimeter nodular densities about the bilateral parotid glands favored to reflect intraparotid lymph nodes. 9 mm left thyroid nodule, of doubtful significance given size and patient age, no follow-up imaging recommended (ref: J Am Coll Radiol. 2015 Feb;12(2): 143-50). Upper chest:  Visualized upper chest demonstrates no acute finding. Irregular soft tissue density partially visualized at the level of the AP window (series 5, image 161), likely adenopathy as seen on prior studies. This appears somewhat increased in size measuring up to 2.5 cm (previously 1.9 cm on prior chest CT from 06/06/2018. Review of the MIP images confirms the above findings CTA HEAD FINDINGS Anterior circulation: Petrous segments patent bilaterally. Atheromatous change within the carotid siphons without hemodynamically significant stenosis. A1 segments, anterior communicating artery complex common anterior cerebral arteries patent without stenosis. No M1 stenosis or occlusion. No proximal MCA branch occlusion. Distal left MCA branches widely patent. There is a focal severe proximal right M3 stenosis (series 13, image 76). Right MCA branches otherwise patent and well perfused. Posterior circulation: Both V4 segments patent without significant stenosis. Left PICA patent. Right PICA origin not well seen. Basilar patent to its distal aspect without stenosis superior cerebellar arteries patent proximally. Both PCAs primarily supplied via the basilar. Focal moderate proximal left P2 stenosis (series 12, image 117). Additional severe distal left P3 stenoses (series 12, image 137). Left PCA attenuated but patent distally. On the right, there is a focal severe right P2 stenosis (series 12, image 115) right PCA mildly irregular  but otherwise patent to its distal aspect. Venous sinuses: Patent allowing for timing the contrast bolus. Anatomic variants: None significant.  No aneurysm. Review of the MIP images confirms the above findings IMPRESSION: CT HEAD IMPRESSION: 1. Normal expected interval evolution of acute right thalamic infarct, stable in size relative to previous MRI. No evidence for hemorrhagic transformation or other complication. 2. No other new acute intracranial abnormality. 3. Underlying atrophy with chronic small  vessel ischemic disease. CTA HEAD AND NECK: 1. Negative CTA for large vessel occlusion. 2. Intracranial atherosclerotic disease with multifocal moderate to severe right M3, bilateral P2, and distal left P3 stenoses as above. No proximal high-grade or correctable stenosis. Overall, appearance is similar as compared to prior CTA from 04/10/2021. 3. Wide patency of the major arterial vasculature within the neck. 4. Mediastinal adenopathy, partially visualized and incompletely assessed on this exam. Further evaluation with dedicated cross-sectional imaging of the chest suggested for complete evaluation. 5. 8 mm nodular lesion within the superior left parotid gland, indeterminate. While this may reflect an intraparotid lymph node, a possible small primary salivary neoplasm could also have this appearance. Nonemergent outpatient ENT referral for further workup suggested. Electronically Signed   By: Jeannine Boga M.D.   On: 01/08/2022 05:24   ECHOCARDIOGRAM COMPLETE  Result Date: 01/07/2022    ECHOCARDIOGRAM REPORT   Patient Name:   DAIYA TAMER Date of Exam: 01/07/2022 Medical Rec #:  [id number]      Height:       62.0 in Accession #:    [id number]     Weight:       230.6 lb Date of Birth:  1956/07/19      BSA:          2.031 m Patient Age:    73 years       BP:           161/93 mmHg Patient Gender: F              HR:           82 bpm. Exam Location:  Inpatient Procedure: 2D Echo, Cardiac Doppler and Color Doppler Indications:    Stroke  History:        Patient has prior history of Echocardiogram examinations, most                 recent 04/11/2021. Risk Factors:Hypertension, Diabetes and HLD.  Sonographer:    Joette Catching RCS Referring Phys: [place]  1. Left ventricular ejection fraction, by estimation, is 60 to 65%. The left ventricle has normal function. The left ventricle has no regional wall motion abnormalities. There is mild left ventricular hypertrophy. Left ventricular diastolic  parameters are indeterminate.  2. Right ventricular systolic function is normal. The right ventricular size is normal.  3. Trivial mitral valve regurgitation.  4. The aortic valve is tricuspid. Aortic valve regurgitation is not visualized. Aortic valve sclerosis is present, with no evidence of aortic valve stenosis.  5. The inferior vena cava is normal in size with greater than 50% respiratory variability, suggesting right atrial pressure of 3 mmHg. Comparison(s): The left ventricular function is unchanged. FINDINGS  Left Ventricle: Left ventricular ejection fraction, by estimation, is 60 to 65%. The left ventricle has normal function. The left ventricle has no regional wall motion abnormalities. The left ventricular internal cavity size was normal in size. There is  mild left ventricular hypertrophy. Left ventricular diastolic parameters are indeterminate. Right Ventricle: The right ventricular size is  normal. Right vetricular wall thickness was not assessed. Right ventricular systolic function is normal. Left Atrium: Left atrial size was normal in size. Right Atrium: Right atrial size was normal in size. Pericardium: There is no evidence of pericardial effusion. Mitral Valve: There is mild thickening of the mitral valve leaflet(s). There is mild calcification of the mitral valve leaflet(s). Trivial mitral valve regurgitation. Tricuspid Valve: The tricuspid valve is normal in structure. Tricuspid valve regurgitation is trivial. Aortic Valve: The aortic valve is tricuspid. Aortic valve regurgitation is not visualized. Aortic valve sclerosis is present, with no evidence of aortic valve stenosis. Aortic valve mean gradient measures 4.0 mmHg. Aortic valve peak gradient measures 6.6  mmHg. Aortic valve area, by VTI measures 2.65 cm. Pulmonic Valve: The pulmonic valve was normal in structure. Pulmonic valve regurgitation is not visualized. Aorta: The aortic root and ascending aorta are structurally normal, with no  evidence of dilitation. Venous: The inferior vena cava is normal in size with greater than 50% respiratory variability, suggesting right atrial pressure of 3 mmHg. IAS/Shunts: No atrial level shunt detected by color flow Doppler.  LEFT VENTRICLE PLAX 2D LVIDd:         3.80 cm   Diastology LVIDs:         2.50 cm   LV e' medial:    6.96 cm/s LV PW:         0.70 cm   LV E/e' medial:  10.4 LV IVS:        1.30 cm   LV e' lateral:   9.25 cm/s LVOT diam:     2.00 cm   LV E/e' lateral: 7.8 LV SV:         70 LV SV Index:   34 LVOT Area:     3.14 cm  RIGHT VENTRICLE             IVC RV Basal diam:  2.30 cm     IVC diam: 1.40 cm RV Mid diam:    2.50 cm RV S prime:     18.70 cm/s TAPSE (M-mode): 2.4 cm LEFT ATRIUM             Index        RIGHT ATRIUM           Index LA diam:        3.00 cm 1.48 cm/m   RA Area:     11.40 cm LA Vol (A2C):   33.4 ml 16.45 ml/m  RA Volume:   19.60 ml  9.65 ml/m LA Vol (A4C):   39.3 ml 19.35 ml/m LA Biplane Vol: 36.5 ml 17.97 ml/m  AORTIC VALVE                    PULMONIC VALVE AV Area (Vmax):    2.80 cm     PV Vmax:       0.90 m/s AV Area (Vmean):   2.43 cm     PV Peak grad:  3.2 mmHg AV Area (VTI):     2.65 cm AV Vmax:           128.00 cm/s AV Vmean:          93.800 cm/s AV VTI:            0.264 m AV Peak Grad:      6.6 mmHg AV Mean Grad:      4.0 mmHg LVOT Vmax:         114.00 cm/s LVOT Vmean:  72.600 cm/s LVOT VTI:          0.223 m LVOT/AV VTI ratio: 0.84  AORTA Ao Root diam: 3.40 cm Ao Asc diam:  3.80 cm MITRAL VALVE                TRICUSPID VALVE MV Area (PHT): 7.22 cm     TR Peak grad:   26.0 mmHg MV Decel Time: 105 msec     TR Vmax:        255.00 cm/s MV E velocity: 72.40 cm/s MV A velocity: 114.00 cm/s  SHUNTS MV E/A ratio:  0.64         Systemic VTI:  0.22 m                             Systemic Diam: 2.00 cm Dorris Carnes MD Electronically signed by Dorris Carnes MD Signature Date/Time: 01/07/2022/11:57:51 AM    Final    MR BRAIN WO CONTRAST  Result Date: 01/07/2022 CLINICAL  DATA:  Follow-up examination for acute stroke. EXAM: MRI HEAD WITHOUT CONTRAST TECHNIQUE: Multiplanar, multiecho pulse sequences of the brain and surrounding structures were obtained without intravenous contrast. COMPARISON:  Prior CTs from earlier the same day. FINDINGS: Brain: Cerebral volume within normal limits. Scattered patchy T2/FLAIR hyperintensity involving the periventricular and deep white matter both cerebral hemispheres, most consistent with chronic small vessel ischemic disease, mild to moderate in nature. 1.2 cm acute ischemic nonhemorrhagic infarcts seen involving the right thalamocapsular region (series 5, image 74). No significant mass effect. No other evidence for acute or subacute ischemia. Gray-white matter differentiation otherwise maintained. No areas of chronic cortical infarction. No acute or chronic intracranial blood products. No mass lesion, mass effect or midline shift. No hydrocephalus or extra-axial fluid collection. Pituitary gland suprasellar region within normal limits. Midline structures intact and normally formed. Vascular: Major intracranial vascular flow voids are maintained. Skull and upper cervical spine: Craniocervical junction within normal limits. Bone marrow signal intensity normal. Partial segmental fusion of the C3 and C4 vertebral bodies noted. No scalp soft tissue abnormality. Sinuses/Orbits: Prior bilateral ocular lens replacement. Paranasal sinuses are largely clear. No mastoid effusion. Other: None. IMPRESSION: 1. 1.2 cm acute ischemic nonhemorrhagic right thalamocapsular infarct. 2. Underlying mild to moderate chronic microvascular ischemic disease. Electronically Signed   By: Jeannine Boga M.D.   On: 01/07/2022 00:20   CT HEAD CODE STROKE WO CONTRAST  Result Date: 01/06/2022 CLINICAL DATA:  Neuro deficit, acute, stroke suspected EXAM: CT HEAD WITHOUT CONTRAST TECHNIQUE: Contiguous axial images were obtained from the base of the skull through the vertex  without intravenous contrast. RADIATION DOSE REDUCTION: This exam was performed according to the departmental dose-optimization program which includes automated exposure control, adjustment of the mA and/or kV according to patient size and/or use of iterative reconstruction technique. COMPARISON:  04/10/2021 FINDINGS: Brain: There is no acute intracranial hemorrhage, mass effect, or edema. No new loss of gray-white differentiation. Ventricles and sulci are stable in size and configuration. Patchy hypoattenuation in the supratentorial white matter is nonspecific but may reflect similar mild chronic microvascular ischemic changes. There is no extra-axial collection. Vascular: No hyperdense vessel. Skull: Unremarkable. Sinuses/Orbits: No acute finding. Other: Mastoid air cells are clear. ASPECTS [facility] Stroke Program Early CT Score) - Ganglionic level infarction (caudate, lentiform nuclei, internal capsule, insula, M1-M3 cortex): 7 - Supraganglionic infarction (M4-M6 cortex): 3 Total score (0-10 with 10 being normal): 10 IMPRESSION: There is no acute intracranial hemorrhage or  evidence of acute infarction. Mild chronic microvascular ischemic changes. Electronically Signed   By: Macy Mis M.D.   On: 01/06/2022 18:10     PHYSICAL EXAM  Temp:  [98.3 F (36.8 C)-99.3 F (37.4 C)] 98.6 F (37 C) (06/16 0755) Pulse Rate:  [69-100] 69 (06/16 0755) Resp:  [13-26] 18 (06/16 0755) BP: (155-175)/(68-101) 163/91 (06/16 0755) SpO2:  [91 %-98 %] 98 % (06/16 0755)  General - Well nourished, well developed, in no apparent distress.  Ophthalmologic - fundi not visualized due to noncooperation.  Cardiovascular - Regular rhythm and rate.  Neuro - awake and alert sitting up in bed in NAD. RN at bedside. She c/o headache this am and high blood pressure and high glucose. Headache has resolved since taking Tylenol. She is alert and oriented x4, mild dysarthria, mild left facial droop. Left arm and left lower 4/5,  right upper and lower 5/5. Decreased sensation on left face, arm and lower. gait not tested.    ASSESSMENT/PLAN Ms. Shelby Morgan is a 66 y.o. female with history of hypertension, hyperlipidemia, diabetes, obesity, Bell's palsy, and stroke in 03/2021 admitted for left-sided numbness and weakness.  Status post TNK.  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 Ongoing aggressive stroke risk factor management Therapy recommendations: CIR Disposition: Pending  History of stroke 03/2021 MRI showed left thalamic infarct.  CTA head and neck right PCA occlusion, left P2 moderate stenosis, left P3 occlusion.  LDL 52, A1c 6.4.  Discharged on DAPT for 3 weeks and then aspirin alone.  Also discharged on Zocor 40.  Diabetes HgbA1c 7.1 goal < 7.0 Uncontrolled CBG monitoring SSI DM education and close PCP follow up Dietitian consult for DM diet education  Hypertension Stable BP goal less than 180/105  On home lasix 20 Long term BP goal normotensive  Hyperlipidemia Home meds: Zocor 40 LDL 82, goal < 70 Now on Crestor 20 Continue statin at discharge  Other Stroke Risk Factors Advanced age Obesity, Body mass index is 42.18 kg/m.   Other Active Problems History of Bell's palsy Melanoma in 2009  Hospital day # 3  Beulah Gandy DNP, ACNPC-AG   ATTENDING NOTE: I reviewed above note and agree with the assessment and plan. Pt was seen and examined.   PT/OT at bedside working with patient.  She is sitting  in chair, still has mild left hemiparesis but continue to improve.  In good spirit, motivated for PT/OT and rehab.  Still pending insurance approval.  Continue DAPT and statin.  For detailed assessment and plan, please refer to above as I have made changes wherever appropriate.   Rosalin Hawking, MD PhD Stroke Neurology 01/09/2022 7:07 PM    To contact Stroke Continuity provider, please refer to [email or web address] After hours, contact General Neurology

## 2022-01-09 NOTE — Care Management Important Message (Signed)
Important Message  Patient Details  Name: Shelby Morgan MRN: [id number] Date of Birth: Nov 15, 1955   Medicare Important Message Given:  Yes     Orbie Pyo 01/09/2022, 1:53 PM

## 2022-01-10 DIAGNOSIS — I63331 Cerebral infarction due to thrombosis of right posterior cerebral artery: Secondary | ICD-10-CM | POA: Diagnosis not present

## 2022-01-10 LAB — CBC
HCT: 40.4 % (ref 36.0–46.0)
Hemoglobin: 13.1 g/dL (ref 12.0–15.0)
MCH: 27.5 pg (ref 26.0–34.0)
MCHC: 32.4 g/dL (ref 30.0–36.0)
MCV: 84.7 fL (ref 80.0–100.0)
Platelets: 362 10*3/uL (ref 150–400)
RBC: 4.77 MIL/uL (ref 3.87–5.11)
RDW: 13.6 % (ref 11.5–15.5)
WBC: 9.4 10*3/uL (ref 4.0–10.5)
nRBC: 0 % (ref 0.0–0.2)

## 2022-01-10 LAB — BASIC METABOLIC PANEL
Anion gap: 11 (ref 5–15)
BUN: 17 mg/dL (ref 8–23)
CO2: 25 mmol/L (ref 22–32)
Calcium: 9.4 mg/dL (ref 8.9–10.3)
Chloride: 100 mmol/L (ref 98–111)
Creatinine, Ser: 1.05 mg/dL — ABNORMAL HIGH (ref 0.44–1.00)
GFR, Estimated: 59 mL/min — ABNORMAL LOW (ref >60)
Glucose, Bld: 170 mg/dL — ABNORMAL HIGH (ref 70–99)
Potassium: 3.7 mmol/L (ref 3.5–5.1)
Sodium: 136 mmol/L (ref 135–145)

## 2022-01-10 LAB — GLUCOSE, CAPILLARY
Glucose-Capillary: 174 mg/dL — ABNORMAL HIGH (ref 70–99)
Glucose-Capillary: 175 mg/dL — ABNORMAL HIGH (ref 70–99)
Glucose-Capillary: 145 mg/dL — ABNORMAL HIGH (ref 70–99)
Glucose-Capillary: 194 mg/dL — ABNORMAL HIGH (ref 70–99)

## 2022-01-10 NOTE — Plan of Care (Signed)
  Problem: Education: Goal: Knowledge of General Education information will improve Description: Including pain rating scale, medication(s)/side effects and non-pharmacologic comfort measures Outcome: Progressing   Problem: Health Behavior/Discharge Planning: Goal: Ability to manage health-related needs will improve Outcome: Progressing   Problem: Clinical Measurements: Goal: Ability to maintain clinical measurements within normal limits will improve Outcome: Progressing Goal: Will remain free from infection Outcome: Progressing Goal: Diagnostic test results will improve Outcome: Progressing Goal: Respiratory complications will improve Outcome: Progressing Goal: Cardiovascular complication will be avoided Outcome: Progressing   Problem: Activity: Goal: Risk for activity intolerance will decrease Outcome: Progressing   Problem: Nutrition: Goal: Adequate nutrition will be maintained Outcome: Progressing   Problem: Coping: Goal: Level of anxiety will decrease Outcome: Progressing   Problem: Elimination: Goal: Will not experience complications related to bowel motility Outcome: Progressing Goal: Will not experience complications related to urinary retention Outcome: Progressing   Problem: Pain Managment: Goal: General experience of comfort will improve Outcome: Progressing   Problem: Safety: Goal: Ability to remain free from injury will improve Outcome: Progressing   Problem: Skin Integrity: Goal: Risk for impaired skin integrity will decrease Outcome: Progressing   Problem: Education: Goal: Knowledge of secondary prevention will improve (SELECT ALL) Outcome: Progressing   Problem: Education: Goal: Ability to describe self-care measures that may prevent or decrease complications (Diabetes Survival Skills Education) will improve Outcome: Progressing Goal: Individualized Educational Video(s) Outcome: Progressing   Problem: Coping: Goal: Ability to adjust to  condition or change in health will improve Outcome: Progressing   Problem: Fluid Volume: Goal: Ability to maintain a balanced intake and output will improve Outcome: Progressing   Problem: Health Behavior/Discharge Planning: Goal: Ability to identify and utilize available resources and services will improve Outcome: Progressing Goal: Ability to manage health-related needs will improve Outcome: Progressing   Problem: Metabolic: Goal: Ability to maintain appropriate glucose levels will improve Outcome: Progressing   Problem: Nutritional: Goal: Maintenance of adequate nutrition will improve Outcome: Progressing Goal: Progress toward achieving an optimal weight will improve Outcome: Progressing   Problem: Skin Integrity: Goal: Risk for impaired skin integrity will decrease Outcome: Progressing   Problem: Tissue Perfusion: Goal: Adequacy of tissue perfusion will improve Outcome: Progressing   Problem: Education: Goal: Knowledge of secondary prevention will improve (SELECT ALL) Outcome: Progressing Goal: Knowledge of patient specific risk factors will improve (INDIVIDUALIZE FOR PATIENT) Outcome: Progressing   Problem: Ischemic Stroke/TIA Tissue Perfusion: Goal: Complications of ischemic stroke/TIA will be minimized Outcome: Progressing

## 2022-01-10 NOTE — Plan of Care (Signed)
  Problem: Education: Goal: Knowledge of General Education information will improve Description: Including pain rating scale, medication(s)/side effects and non-pharmacologic comfort measures Outcome: Progressing   Problem: Health Behavior/Discharge Planning: Goal: Ability to manage health-related needs will improve Outcome: Progressing   Problem: Clinical Measurements: Goal: Ability to maintain clinical measurements within normal limits will improve Outcome: Progressing Goal: Will remain free from infection Outcome: Progressing Goal: Diagnostic test results will improve Outcome: Progressing Goal: Cardiovascular complication will be avoided Outcome: Progressing   Problem: Activity: Goal: Risk for activity intolerance will decrease Outcome: Progressing   Problem: Nutrition: Goal: Adequate nutrition will be maintained Outcome: Progressing   Problem: Coping: Goal: Level of anxiety will decrease Outcome: Progressing   Problem: Elimination: Goal: Will not experience complications related to bowel motility Outcome: Progressing Goal: Will not experience complications related to urinary retention Outcome: Progressing   Problem: Pain Managment: Goal: General experience of comfort will improve Outcome: Progressing   Problem: Education: Goal: Knowledge of secondary prevention will improve (SELECT ALL) Outcome: Progressing   Problem: Education: Goal: Ability to describe self-care measures that may prevent or decrease complications (Diabetes Survival Skills Education) will improve Outcome: Progressing   Problem: Coping: Goal: Ability to adjust to condition or change in health will improve Outcome: Progressing   Problem: Fluid Volume: Goal: Ability to maintain a balanced intake and output will improve Outcome: Progressing   Problem: Nutritional: Goal: Maintenance of adequate nutrition will improve Outcome: Progressing   Problem: Skin Integrity: Goal: Risk for impaired  skin integrity will decrease Outcome: Progressing   Problem: Ischemic Stroke/TIA Tissue Perfusion: Goal: Complications of ischemic stroke/TIA will be minimized Outcome: Progressing

## 2022-01-10 NOTE — Progress Notes (Addendum)
STROKE TEAM PROGRESS NOTE   SUBJECTIVE (INTERVAL HISTORY) Patient is awake and alert sitting up in bed talking on the phone. Family arrived during examination and updated by Dr. Leonie Man. Tracks, oriented x 4, mild facial droop, no aphasia or slurred speech. Movement has improved on left side, numbness is still an issue. FIne motor skills an issue on left VIsual fields full to finger counting. No ataxia. Left arm and left leg 4/5. No new neurological events overnight. No voiced complaints. Awaiting insurance approval for CIR.  Discussed sleep smart study  OBJECTIVE Temp:  [97.6 F (36.4 C)-98.9 F (37.2 C)] 98.9 F (37.2 C) (06/17 0739) Pulse Rate:  [76-90] 85 (06/17 0739) Cardiac Rhythm: Normal sinus rhythm (06/17 0736) Resp:  [16-18] 16 (06/17 0739) BP: (134-167)/(69-90) 134/84 (06/17 0739) SpO2:  [96 %-99 %] 96 % (06/17 0739)  Recent Labs  Lab 01/09/22 0934 01/09/22 1338 01/09/22 1655 01/09/22 2128 01/10/22 0743  GLUCAP 199* 142* 167* 183* 174*    Recent Labs  Lab 01/06/22 1821 01/06/22 1831 01/08/22 0904 01/09/22 0226 01/10/22 0248  NA 137 140 142 140 136  K 3.6 3.7 3.9 4.3 3.7  CL 105 102 103 101 100  CO2 24  --  '26 27 25  '$ GLUCOSE 205* 205* 215* 173* 170*  BUN '14 13 14 19 17  '$ CREATININE 0.91 0.80 0.95 1.04* 1.05*  CALCIUM 9.0  --  9.6 9.5 9.4    Recent Labs  Lab 01/06/22 1821  AST 36  ALT 38  ALKPHOS 61  BILITOT 0.7  PROT 7.7  ALBUMIN 3.8    Recent Labs  Lab 01/06/22 1821 01/06/22 1831 01/08/22 0904 01/09/22 0226 01/10/22 0248  WBC 8.3  --  8.0 10.5 9.4  NEUTROABS 5.1  --   --   --   --   HGB 13.1 13.6 12.7 12.3 13.1  HCT 39.7 40.0 39.2 38.5 40.4  MCV 83.9  --  84.7 84.8 84.7  PLT 389  --  345 362 362    No results for input(s): "CKTOTAL", "CKMB", "CKMBINDEX", "TROPONINI" in the last 168 hours. No results for input(s): "LABPROT", "INR" in the last 72 hours.  No results for input(s): "COLORURINE", "LABSPEC", "PHURINE", "GLUCOSEU", "HGBUR",  "BILIRUBINUR", "KETONESUR", "PROTEINUR", "UROBILINOGEN", "NITRITE", "LEUKOCYTESUR" in the last 72 hours.  Invalid input(s): "APPERANCEUR"      Component Value Date/Time   CHOL 156 01/07/2022 0418   TRIG 107 01/07/2022 0418   HDL 53 01/07/2022 0418   CHOLHDL 2.9 01/07/2022 0418   VLDL 21 01/07/2022 0418   LDLCALC 82 01/07/2022 0418   Lab Results  Component Value Date   HGBA1C 7.1 (H) 01/07/2022      Component Value Date/Time   LABOPIA NONE DETECTED 01/06/2022 1927   COCAINSCRNUR NONE DETECTED 01/06/2022 1927   LABBENZ NONE DETECTED 01/06/2022 1927   AMPHETMU NONE DETECTED 01/06/2022 1927   THCU NONE DETECTED 01/06/2022 1927   LABBARB NONE DETECTED 01/06/2022 1927    Recent Labs  Lab 01/06/22 1821  ETH <10     I have personally reviewed the radiological images below and agree with the radiology interpretations.  CT ANGIO HEAD NECK W WO CM  Result Date: 01/08/2022 CLINICAL DATA:  Follow-up examination for acute stroke. EXAM: CT ANGIOGRAPHY HEAD AND NECK TECHNIQUE: Multidetector CT imaging of the head and neck was performed using the standard protocol during bolus administration of intravenous contrast. Multiplanar CT image reconstructions and MIPs were obtained to evaluate the vascular anatomy. Carotid stenosis measurements (when applicable) are  obtained utilizing NASCET criteria, using the distal internal carotid diameter as the denominator. RADIATION DOSE REDUCTION: This exam was performed according to the departmental dose-optimization program which includes automated exposure control, adjustment of the mA and/or kV according to patient size and/or use of iterative reconstruction technique. CONTRAST:  15m OMNIPAQUE IOHEXOL 350 MG/ML SOLN COMPARISON:  Prior MRI from 01/06/2022 as well as prior CTA from 04/10/2021 and chest CT from 06/06/2018. FINDINGS: CT HEAD FINDINGS Brain: There has been continued interval evolution of previously identified acute infarct involving the right  thalamic capsular region, stable in size as compared to previous MRI. No associated mass effect or evidence for hemorrhagic transformation. No other acute intracranial hemorrhage or large vessel territory infarct. Underlying atrophy with mild chronic small vessel ischemic disease noted. Small remote left thalamic lacunar infarct noted. No mass lesion or midline shift. No hydrocephalus or extra-axial fluid collection. Vascular: No hyperdense vessel. Scattered vascular calcifications noted within the carotid siphons. Skull: Scalp soft tissues and calvarium within normal limits. Sinuses: Paranasal sinuses are largely clear.  No mastoid effusion. Orbits: Globes orbital soft tissues demonstrate no acute finding. Review of the MIP images confirms the above findings CTA NECK FINDINGS Aortic arch: Visualized aortic arch normal in caliber with standard 3 vessel branching pattern. Mild plaque within the arch itself. No stenosis about the origin of the great vessels. Right carotid system: Right common and internal carotid arteries patent without stenosis or dissection. Mild for age plaque about the right carotid bulb without stenosis. Left carotid system: Left common and internal carotid arteries patent without stenosis or dissection. Mild for age plaque about the left carotid bulb without significant stenosis. Vertebral arteries: Both vertebral arteries arise from the subclavian arteries. No proximal subclavian artery stenosis. Both vertebral arteries widely patent without stenosis, dissection or occlusion. Skeleton: No discrete or worrisome osseous lesions. Segmental fusion of the C3 and C4 vertebral bodies noted. Moderate spondylosis noted at C5-6 and C6-7. Other neck: No other acute soft tissue abnormality within the neck. 8 mm soft tissue nodule present at the superior aspect left parotid gland, indeterminate. Few additional scattered subcentimeter nodular densities about the bilateral parotid glands favored to reflect  intraparotid lymph nodes. 9 mm left thyroid nodule, of doubtful significance given size and patient age, no follow-up imaging recommended (ref: J Am Coll Radiol. 2015 Feb;12(2): 143-50). Upper chest: Visualized upper chest demonstrates no acute finding. Irregular soft tissue density partially visualized at the level of the AP window (series 5, image 161), likely adenopathy as seen on prior studies. This appears somewhat increased in size measuring up to 2.5 cm (previously 1.9 cm on prior chest CT from 06/06/2018. Review of the MIP images confirms the above findings CTA HEAD FINDINGS Anterior circulation: Petrous segments patent bilaterally. Atheromatous change within the carotid siphons without hemodynamically significant stenosis. A1 segments, anterior communicating artery complex common anterior cerebral arteries patent without stenosis. No M1 stenosis or occlusion. No proximal MCA branch occlusion. Distal left MCA branches widely patent. There is a focal severe proximal right M3 stenosis (series 13, image 76). Right MCA branches otherwise patent and well perfused. Posterior circulation: Both V4 segments patent without significant stenosis. Left PICA patent. Right PICA origin not well seen. Basilar patent to its distal aspect without stenosis superior cerebellar arteries patent proximally. Both PCAs primarily supplied via the basilar. Focal moderate proximal left P2 stenosis (series 12, image 117). Additional severe distal left P3 stenoses (series 12, image 137). Left PCA attenuated but patent distally. On the right, there  is a focal severe right P2 stenosis (series 12, image 115) right PCA mildly irregular but otherwise patent to its distal aspect. Venous sinuses: Patent allowing for timing the contrast bolus. Anatomic variants: None significant.  No aneurysm. Review of the MIP images confirms the above findings IMPRESSION: CT HEAD IMPRESSION: 1. Normal expected interval evolution of acute right thalamic infarct,  stable in size relative to previous MRI. No evidence for hemorrhagic transformation or other complication. 2. No other new acute intracranial abnormality. 3. Underlying atrophy with chronic small vessel ischemic disease. CTA HEAD AND NECK: 1. Negative CTA for large vessel occlusion. 2. Intracranial atherosclerotic disease with multifocal moderate to severe right M3, bilateral P2, and distal left P3 stenoses as above. No proximal high-grade or correctable stenosis. Overall, appearance is similar as compared to prior CTA from 04/10/2021. 3. Wide patency of the major arterial vasculature within the neck. 4. Mediastinal adenopathy, partially visualized and incompletely assessed on this exam. Further evaluation with dedicated cross-sectional imaging of the chest suggested for complete evaluation. 5. 8 mm nodular lesion within the superior left parotid gland, indeterminate. While this may reflect an intraparotid lymph node, a possible small primary salivary neoplasm could also have this appearance. Nonemergent outpatient ENT referral for further workup suggested. Electronically Signed   By: Jeannine Boga M.D.   On: 01/08/2022 05:24   ECHOCARDIOGRAM COMPLETE  Result Date: 01/07/2022    ECHOCARDIOGRAM REPORT   Patient Name:   Shelby Morgan Date of Exam: 01/07/2022 Medical Rec #:  [id number]      Height:       62.0 in Accession #:    [id number]     Weight:       230.6 lb Date of Birth:  08-May-1956      BSA:          2.031 m Patient Age:    34 years       BP:           161/93 mmHg Patient Gender: F              HR:           82 bpm. Exam Location:  Inpatient Procedure: 2D Echo, Cardiac Doppler and Color Doppler Indications:    Stroke  History:        Patient has prior history of Echocardiogram examinations, most                 recent 04/11/2021. Risk Factors:Hypertension, Diabetes and HLD.  Sonographer:    Joette Catching RCS Referring Phys: [place]  1. Left ventricular ejection fraction, by  estimation, is 60 to 65%. The left ventricle has normal function. The left ventricle has no regional wall motion abnormalities. There is mild left ventricular hypertrophy. Left ventricular diastolic parameters are indeterminate.  2. Right ventricular systolic function is normal. The right ventricular size is normal.  3. Trivial mitral valve regurgitation.  4. The aortic valve is tricuspid. Aortic valve regurgitation is not visualized. Aortic valve sclerosis is present, with no evidence of aortic valve stenosis.  5. The inferior vena cava is normal in size with greater than 50% respiratory variability, suggesting right atrial pressure of 3 mmHg. Comparison(s): The left ventricular function is unchanged. FINDINGS  Left Ventricle: Left ventricular ejection fraction, by estimation, is 60 to 65%. The left ventricle has normal function. The left ventricle has no regional wall motion abnormalities. The left ventricular internal cavity size was normal in size. There is  mild left  ventricular hypertrophy. Left ventricular diastolic parameters are indeterminate. Right Ventricle: The right ventricular size is normal. Right vetricular wall thickness was not assessed. Right ventricular systolic function is normal. Left Atrium: Left atrial size was normal in size. Right Atrium: Right atrial size was normal in size. Pericardium: There is no evidence of pericardial effusion. Mitral Valve: There is mild thickening of the mitral valve leaflet(s). There is mild calcification of the mitral valve leaflet(s). Trivial mitral valve regurgitation. Tricuspid Valve: The tricuspid valve is normal in structure. Tricuspid valve regurgitation is trivial. Aortic Valve: The aortic valve is tricuspid. Aortic valve regurgitation is not visualized. Aortic valve sclerosis is present, with no evidence of aortic valve stenosis. Aortic valve mean gradient measures 4.0 mmHg. Aortic valve peak gradient measures 6.6  mmHg. Aortic valve area, by VTI measures  2.65 cm. Pulmonic Valve: The pulmonic valve was normal in structure. Pulmonic valve regurgitation is not visualized. Aorta: The aortic root and ascending aorta are structurally normal, with no evidence of dilitation. Venous: The inferior vena cava is normal in size with greater than 50% respiratory variability, suggesting right atrial pressure of 3 mmHg. IAS/Shunts: No atrial level shunt detected by color flow Doppler.  LEFT VENTRICLE PLAX 2D LVIDd:         3.80 cm   Diastology LVIDs:         2.50 cm   LV e' medial:    6.96 cm/s LV PW:         0.70 cm   LV E/e' medial:  10.4 LV IVS:        1.30 cm   LV e' lateral:   9.25 cm/s LVOT diam:     2.00 cm   LV E/e' lateral: 7.8 LV SV:         70 LV SV Index:   34 LVOT Area:     3.14 cm  RIGHT VENTRICLE             IVC RV Basal diam:  2.30 cm     IVC diam: 1.40 cm RV Mid diam:    2.50 cm RV S prime:     18.70 cm/s TAPSE (M-mode): 2.4 cm LEFT ATRIUM             Index        RIGHT ATRIUM           Index LA diam:        3.00 cm 1.48 cm/m   RA Area:     11.40 cm LA Vol (A2C):   33.4 ml 16.45 ml/m  RA Volume:   19.60 ml  9.65 ml/m LA Vol (A4C):   39.3 ml 19.35 ml/m LA Biplane Vol: 36.5 ml 17.97 ml/m  AORTIC VALVE                    PULMONIC VALVE AV Area (Vmax):    2.80 cm     PV Vmax:       0.90 m/s AV Area (Vmean):   2.43 cm     PV Peak grad:  3.2 mmHg AV Area (VTI):     2.65 cm AV Vmax:           128.00 cm/s AV Vmean:          93.800 cm/s AV VTI:            0.264 m AV Peak Grad:      6.6 mmHg AV Mean Grad:      4.0 mmHg  LVOT Vmax:         114.00 cm/s LVOT Vmean:        72.600 cm/s LVOT VTI:          0.223 m LVOT/AV VTI ratio: 0.84  AORTA Ao Root diam: 3.40 cm Ao Asc diam:  3.80 cm MITRAL VALVE                TRICUSPID VALVE MV Area (PHT): 7.22 cm     TR Peak grad:   26.0 mmHg MV Decel Time: 105 msec     TR Vmax:        255.00 cm/s MV E velocity: 72.40 cm/s MV A velocity: 114.00 cm/s  SHUNTS MV E/A ratio:  0.64         Systemic VTI:  0.22 m                              Systemic Diam: 2.00 cm Dorris Carnes MD Electronically signed by Dorris Carnes MD Signature Date/Time: 01/07/2022/11:57:51 AM    Final    MR BRAIN WO CONTRAST  Result Date: 01/07/2022 CLINICAL DATA:  Follow-up examination for acute stroke. EXAM: MRI HEAD WITHOUT CONTRAST TECHNIQUE: Multiplanar, multiecho pulse sequences of the brain and surrounding structures were obtained without intravenous contrast. COMPARISON:  Prior CTs from earlier the same day. FINDINGS: Brain: Cerebral volume within normal limits. Scattered patchy T2/FLAIR hyperintensity involving the periventricular and deep white matter both cerebral hemispheres, most consistent with chronic small vessel ischemic disease, mild to moderate in nature. 1.2 cm acute ischemic nonhemorrhagic infarcts seen involving the right thalamocapsular region (series 5, image 74). No significant mass effect. No other evidence for acute or subacute ischemia. Gray-white matter differentiation otherwise maintained. No areas of chronic cortical infarction. No acute or chronic intracranial blood products. No mass lesion, mass effect or midline shift. No hydrocephalus or extra-axial fluid collection. Pituitary gland suprasellar region within normal limits. Midline structures intact and normally formed. Vascular: Major intracranial vascular flow voids are maintained. Skull and upper cervical spine: Craniocervical junction within normal limits. Bone marrow signal intensity normal. Partial segmental fusion of the C3 and C4 vertebral bodies noted. No scalp soft tissue abnormality. Sinuses/Orbits: Prior bilateral ocular lens replacement. Paranasal sinuses are largely clear. No mastoid effusion. Other: None. IMPRESSION: 1. 1.2 cm acute ischemic nonhemorrhagic right thalamocapsular infarct. 2. Underlying mild to moderate chronic microvascular ischemic disease. Electronically Signed   By: Jeannine Boga M.D.   On: 01/07/2022 00:20   CT HEAD CODE STROKE WO CONTRAST  Result Date:  01/06/2022 CLINICAL DATA:  Neuro deficit, acute, stroke suspected EXAM: CT HEAD WITHOUT CONTRAST TECHNIQUE: Contiguous axial images were obtained from the base of the skull through the vertex without intravenous contrast. RADIATION DOSE REDUCTION: This exam was performed according to the departmental dose-optimization program which includes automated exposure control, adjustment of the mA and/or kV according to patient size and/or use of iterative reconstruction technique. COMPARISON:  04/10/2021 FINDINGS: Brain: There is no acute intracranial hemorrhage, mass effect, or edema. No new loss of gray-white differentiation. Ventricles and sulci are stable in size and configuration. Patchy hypoattenuation in the supratentorial white matter is nonspecific but may reflect similar mild chronic microvascular ischemic changes. There is no extra-axial collection. Vascular: No hyperdense vessel. Skull: Unremarkable. Sinuses/Orbits: No acute finding. Other: Mastoid air cells are clear. ASPECTS [facility] Stroke Program Early CT Score) - Ganglionic level infarction (caudate, lentiform nuclei, internal capsule, insula, M1-M3 cortex): 7 -  Supraganglionic infarction (M4-M6 cortex): 3 Total score (0-10 with 10 being normal): 10 IMPRESSION: There is no acute intracranial hemorrhage or evidence of acute infarction. Mild chronic microvascular ischemic changes. Electronically Signed   By: Macy Mis M.D.   On: 01/06/2022 18:10     PHYSICAL EXAM  Temp:  [97.6 F (36.4 C)-98.9 F (37.2 C)] 98.9 F (37.2 C) (06/17 0739) Pulse Rate:  [76-90] 85 (06/17 0739) Resp:  [16-18] 16 (06/17 0739) BP: (134-167)/(69-90) 134/84 (06/17 0739) SpO2:  [96 %-99 %] 96 % (06/17 0739)  General - Well nourished, well developed, in no apparent distress.  Ophthalmologic - fundi not visualized due to noncooperation.  Cardiovascular - Regular rhythm and rate.  Neuro - awake and alert sitting up in bed in NAD. She is alert and oriented x4, mild  dysarthria, mild left facial droop. Tracks, EOMI, visual fields full to finger count. Left arm and left lower 4/5, with no drift decreased  fine motor skills on left hand right upper and lower 5/5. Decreased sensation on left face, arm and lower. gait not tested.   NIHSS 2 premorbid modified Rankin score 0  ASSESSMENT/PLAN Ms. Shelby Morgan is a 66 y.o. female with history of hypertension, hyperlipidemia, diabetes, obesity, Bell's palsy, and stroke in 03/2021 admitted for left-sided numbness and weakness.  Status post TNK.  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 Ongoing aggressive stroke risk factor management Therapy recommendations: CIR Disposition: Pending  History of stroke 03/2021 MRI showed left thalamic infarct.  CTA head and neck right PCA occlusion, left P2 moderate stenosis, left P3 occlusion.  LDL 52, A1c 6.4.  Discharged on DAPT for 3 weeks and then aspirin alone.  Also discharged on Zocor 40.  Diabetes HgbA1c 7.1 goal < 7.0 Uncontrolled CBG monitoring SSI DM education and close PCP follow up Dietitian consult for DM diet education  Hypertension Stable BP goal less than 180/105  On home lasix 20 Long term BP goal normotensive  Hyperlipidemia Home meds: Zocor 40 LDL 82, goal < 70 Now on Crestor 20 Continue statin at discharge  Other Stroke Risk Factors Advanced age Obesity, Body mass index is 42.18 kg/m.   Other Active Problems History of Bell's palsy Melanoma in  2009  Hospital day # [place] DNP, [facility]   I have personally obtained history,examined this patient, reviewed notes, independently viewed imaging studies, participated in medical decision making and plan of care.ROS completed by me personally and pertinent positives fully documented  I have made any additions or clarifications directly to the above note. Agree with note above.  Patient presented with right brain infarct and was treated with IV thrombolysis with TNK is obtaining good improvement but still has mild left hemisensory loss and facial droop.  She also appears to be at risk for sleep apnea and is interested in participating in the sleep smart study.  She and family were given information to review and review and decide.  Continue ongoing therapies dual antiplatelet therapy for 3 weeks and then  Plavix alone vessel risk factor modification.  Medically stable to be transferred to inpatient rehab when bed available.  Greater than 50% time during this 50-minute visit was spent on counseling and coordination of care about the stroke and discussion with patient and her 2 sons and family and answered questions.  Antony Contras, MD Medical Director [facility] Pager: [phone] 01/10/2022 1:36 PM    To contact Stroke Continuity provider, please refer to [email or web address] After hours, contact General Neurology

## 2022-01-11 DIAGNOSIS — I639 Cerebral infarction, unspecified: Secondary | ICD-10-CM | POA: Diagnosis not present

## 2022-01-11 DIAGNOSIS — I63331 Cerebral infarction due to thrombosis of right posterior cerebral artery: Secondary | ICD-10-CM | POA: Diagnosis not present

## 2022-01-11 LAB — BASIC METABOLIC PANEL
Anion gap: 12 (ref 5–15)
BUN: 20 mg/dL (ref 8–23)
CO2: 26 mmol/L (ref 22–32)
Calcium: 9.4 mg/dL (ref 8.9–10.3)
Chloride: 99 mmol/L (ref 98–111)
Creatinine, Ser: 1.17 mg/dL — ABNORMAL HIGH (ref 0.44–1.00)
GFR, Estimated: 52 mL/min — ABNORMAL LOW (ref >60)
Glucose, Bld: 214 mg/dL — ABNORMAL HIGH (ref 70–99)
Potassium: 4.1 mmol/L (ref 3.5–5.1)
Sodium: 137 mmol/L (ref 135–145)

## 2022-01-11 LAB — CBC
HCT: 38.7 % (ref 36.0–46.0)
Hemoglobin: 12.9 g/dL (ref 12.0–15.0)
MCH: 28 pg (ref 26.0–34.0)
MCHC: 33.3 g/dL (ref 30.0–36.0)
MCV: 83.9 fL (ref 80.0–100.0)
Platelets: 376 10*3/uL (ref 150–400)
RBC: 4.61 MIL/uL (ref 3.87–5.11)
RDW: 13.7 % (ref 11.5–15.5)
WBC: 12.6 10*3/uL — ABNORMAL HIGH (ref 4.0–10.5)
nRBC: 0 % (ref 0.0–0.2)

## 2022-01-11 LAB — GLUCOSE, CAPILLARY
Glucose-Capillary: 227 mg/dL — ABNORMAL HIGH (ref 70–99)
Glucose-Capillary: 241 mg/dL — ABNORMAL HIGH (ref 70–99)
Glucose-Capillary: 181 mg/dL — ABNORMAL HIGH (ref 70–99)
Glucose-Capillary: 199 mg/dL — ABNORMAL HIGH (ref 70–99)
Glucose-Capillary: 225 mg/dL — ABNORMAL HIGH (ref 70–99)

## 2022-01-11 NOTE — Progress Notes (Addendum)
STROKE TEAM PROGRESS NOTE   SUBJECTIVE (INTERVAL HISTORY) Patient is awake and alert sitting up in bed in NAD. Tracks, oriented x 4, mild facial droop, no aphasia or slurred speech. Movement has improved on left side, numbness is still an issue. FIne motor skills an issue on left VIsual fields full to finger counting. No ataxia. Left arm and left leg 4/5. Right side 5/5. Left extinction to DSS. No new neurological events overnight. No voiced complaints. Exam unchanged from yesterday.  Participated in the sleep smart study last night.   OBJECTIVE Temp:  [97.8 F (36.6 C)-99.5 F (37.5 C)] 99.1 F (37.3 C) (06/18 0629) Pulse Rate:  [81-100] 81 (06/18 0629) Cardiac Rhythm: Sinus tachycardia (06/17 1901) Resp:  [16-20] 20 (06/18 0629) BP: (127-159)/(72-93) 130/83 (06/18 0629) SpO2:  [92 %-99 %] 97 % (06/18 0629)  Recent Labs  Lab 01/10/22 1224 01/10/22 1722 01/10/22 2226 01/11/22 0550 01/11/22 0905  GLUCAP 175* 145* 194* 227* 241*    Recent Labs  Lab 01/06/22 1821 01/06/22 1831 01/08/22 0904 01/09/22 0226 01/10/22 0248 01/11/22 0307  NA 137 140 142 140 136 137  K 3.6 3.7 3.9 4.3 3.7 4.1  CL 105 102 103 101 100 99  CO2 24  --  '26 27 25 26  '$ GLUCOSE 205* 205* 215* 173* 170* 214*  BUN '14 13 14 19 17 20  '$ CREATININE 0.91 0.80 0.95 1.04* 1.05* 1.17*  CALCIUM 9.0  --  9.6 9.5 9.4 9.4    Recent Labs  Lab 01/06/22 1821  AST 36  ALT 38  ALKPHOS 61  BILITOT 0.7  PROT 7.7  ALBUMIN 3.8    Recent Labs  Lab 01/06/22 1821 01/06/22 1831 01/08/22 0904 01/09/22 0226 01/10/22 0248 01/11/22 0307  WBC 8.3  --  8.0 10.5 9.4 12.6*  NEUTROABS 5.1  --   --   --   --   --   HGB 13.1 13.6 12.7 12.3 13.1 12.9  HCT 39.7 40.0 39.2 38.5 40.4 38.7  MCV 83.9  --  84.7 84.8 84.7 83.9  PLT 389  --  345 362 362 376    No results for input(s): "CKTOTAL", "CKMB", "CKMBINDEX", "TROPONINI" in the last 168 hours. No results for input(s): "LABPROT", "INR" in the last 72 hours.  No results  for input(s): "COLORURINE", "LABSPEC", "PHURINE", "GLUCOSEU", "HGBUR", "BILIRUBINUR", "KETONESUR", "PROTEINUR", "UROBILINOGEN", "NITRITE", "LEUKOCYTESUR" in the last 72 hours.  Invalid input(s): "APPERANCEUR"      Component Value Date/Time   CHOL 156 01/07/2022 0418   TRIG 107 01/07/2022 0418   HDL 53 01/07/2022 0418   CHOLHDL 2.9 01/07/2022 0418   VLDL 21 01/07/2022 0418   LDLCALC 82 01/07/2022 0418   Lab Results  Component Value Date   HGBA1C 7.1 (H) 01/07/2022      Component Value Date/Time   LABOPIA NONE DETECTED 01/06/2022 1927   COCAINSCRNUR NONE DETECTED 01/06/2022 1927   LABBENZ NONE DETECTED 01/06/2022 1927   AMPHETMU NONE DETECTED 01/06/2022 1927   THCU NONE DETECTED 01/06/2022 1927   LABBARB NONE DETECTED 01/06/2022 1927    Recent Labs  Lab 01/06/22 1821  ETH <10     I have personally reviewed the radiological images below and agree with the radiology interpretations.  CT ANGIO HEAD NECK W WO CM  Result Date: 01/08/2022 CLINICAL DATA:  Follow-up examination for acute stroke. EXAM: CT ANGIOGRAPHY HEAD AND NECK TECHNIQUE: Multidetector CT imaging of the head and neck was performed using the standard protocol during bolus administration of intravenous contrast.  Multiplanar CT image reconstructions and MIPs were obtained to evaluate the vascular anatomy. Carotid stenosis measurements (when applicable) are obtained utilizing NASCET criteria, using the distal internal carotid diameter as the denominator. RADIATION DOSE REDUCTION: This exam was performed according to the departmental dose-optimization program which includes automated exposure control, adjustment of the mA and/or kV according to patient size and/or use of iterative reconstruction technique. CONTRAST:  59m OMNIPAQUE IOHEXOL 350 MG/ML SOLN COMPARISON:  Prior MRI from 01/06/2022 as well as prior CTA from 04/10/2021 and chest CT from 06/06/2018. FINDINGS: CT HEAD FINDINGS Brain: There has been continued interval  evolution of previously identified acute infarct involving the right thalamic capsular region, stable in size as compared to previous MRI. No associated mass effect or evidence for hemorrhagic transformation. No other acute intracranial hemorrhage or large vessel territory infarct. Underlying atrophy with mild chronic small vessel ischemic disease noted. Small remote left thalamic lacunar infarct noted. No mass lesion or midline shift. No hydrocephalus or extra-axial fluid collection. Vascular: No hyperdense vessel. Scattered vascular calcifications noted within the carotid siphons. Skull: Scalp soft tissues and calvarium within normal limits. Sinuses: Paranasal sinuses are largely clear.  No mastoid effusion. Orbits: Globes orbital soft tissues demonstrate no acute finding. Review of the MIP images confirms the above findings CTA NECK FINDINGS Aortic arch: Visualized aortic arch normal in caliber with standard 3 vessel branching pattern. Mild plaque within the arch itself. No stenosis about the origin of the great vessels. Right carotid system: Right common and internal carotid arteries patent without stenosis or dissection. Mild for age plaque about the right carotid bulb without stenosis. Left carotid system: Left common and internal carotid arteries patent without stenosis or dissection. Mild for age plaque about the left carotid bulb without significant stenosis. Vertebral arteries: Both vertebral arteries arise from the subclavian arteries. No proximal subclavian artery stenosis. Both vertebral arteries widely patent without stenosis, dissection or occlusion. Skeleton: No discrete or worrisome osseous lesions. Segmental fusion of the C3 and C4 vertebral bodies noted. Moderate spondylosis noted at C5-6 and C6-7. Other neck: No other acute soft tissue abnormality within the neck. 8 mm soft tissue nodule present at the superior aspect left parotid gland, indeterminate. Few additional scattered subcentimeter  nodular densities about the bilateral parotid glands favored to reflect intraparotid lymph nodes. 9 mm left thyroid nodule, of doubtful significance given size and patient age, no follow-up imaging recommended (ref: J Am Coll Radiol. 2015 Feb;12(2): 143-50). Upper chest: Visualized upper chest demonstrates no acute finding. Irregular soft tissue density partially visualized at the level of the AP window (series 5, image 161), likely adenopathy as seen on prior studies. This appears somewhat increased in size measuring up to 2.5 cm (previously 1.9 cm on prior chest CT from 06/06/2018. Review of the MIP images confirms the above findings CTA HEAD FINDINGS Anterior circulation: Petrous segments patent bilaterally. Atheromatous change within the carotid siphons without hemodynamically significant stenosis. A1 segments, anterior communicating artery complex common anterior cerebral arteries patent without stenosis. No M1 stenosis or occlusion. No proximal MCA branch occlusion. Distal left MCA branches widely patent. There is a focal severe proximal right M3 stenosis (series 13, image 76). Right MCA branches otherwise patent and well perfused. Posterior circulation: Both V4 segments patent without significant stenosis. Left PICA patent. Right PICA origin not well seen. Basilar patent to its distal aspect without stenosis superior cerebellar arteries patent proximally. Both PCAs primarily supplied via the basilar. Focal moderate proximal left P2 stenosis (series 12, image 117). Additional  severe distal left P3 stenoses (series 12, image 137). Left PCA attenuated but patent distally. On the right, there is a focal severe right P2 stenosis (series 12, image 115) right PCA mildly irregular but otherwise patent to its distal aspect. Venous sinuses: Patent allowing for timing the contrast bolus. Anatomic variants: None significant.  No aneurysm. Review of the MIP images confirms the above findings IMPRESSION: CT HEAD  IMPRESSION: 1. Normal expected interval evolution of acute right thalamic infarct, stable in size relative to previous MRI. No evidence for hemorrhagic transformation or other complication. 2. No other new acute intracranial abnormality. 3. Underlying atrophy with chronic small vessel ischemic disease. CTA HEAD AND NECK: 1. Negative CTA for large vessel occlusion. 2. Intracranial atherosclerotic disease with multifocal moderate to severe right M3, bilateral P2, and distal left P3 stenoses as above. No proximal high-grade or correctable stenosis. Overall, appearance is similar as compared to prior CTA from 04/10/2021. 3. Wide patency of the major arterial vasculature within the neck. 4. Mediastinal adenopathy, partially visualized and incompletely assessed on this exam. Further evaluation with dedicated cross-sectional imaging of the chest suggested for complete evaluation. 5. 8 mm nodular lesion within the superior left parotid gland, indeterminate. While this may reflect an intraparotid lymph node, a possible small primary salivary neoplasm could also have this appearance. Nonemergent outpatient ENT referral for further workup suggested. Electronically Signed   By: Jeannine Boga M.D.   On: 01/08/2022 05:24   ECHOCARDIOGRAM COMPLETE  Result Date: 01/07/2022    ECHOCARDIOGRAM REPORT   Patient Name:   JANAYSHA DEPAULO Date of Exam: 01/07/2022 Medical Rec #:  [id number]      Height:       62.0 in Accession #:    [id number]     Weight:       230.6 lb Date of Birth:  August 03, 1955      BSA:          2.031 m Patient Age:    28 years       BP:           161/93 mmHg Patient Gender: F              HR:           82 bpm. Exam Location:  Inpatient Procedure: 2D Echo, Cardiac Doppler and Color Doppler Indications:    Stroke  History:        Patient has prior history of Echocardiogram examinations, most                 recent 04/11/2021. Risk Factors:Hypertension, Diabetes and HLD.  Sonographer:    Joette Catching RCS  Referring Phys: [place]  1. Left ventricular ejection fraction, by estimation, is 60 to 65%. The left ventricle has normal function. The left ventricle has no regional wall motion abnormalities. There is mild left ventricular hypertrophy. Left ventricular diastolic parameters are indeterminate.  2. Right ventricular systolic function is normal. The right ventricular size is normal.  3. Trivial mitral valve regurgitation.  4. The aortic valve is tricuspid. Aortic valve regurgitation is not visualized. Aortic valve sclerosis is present, with no evidence of aortic valve stenosis.  5. The inferior vena cava is normal in size with greater than 50% respiratory variability, suggesting right atrial pressure of 3 mmHg. Comparison(s): The left ventricular function is unchanged. FINDINGS  Left Ventricle: Left ventricular ejection fraction, by estimation, is 60 to 65%. The left ventricle has normal function. The left ventricle has no  regional wall motion abnormalities. The left ventricular internal cavity size was normal in size. There is  mild left ventricular hypertrophy. Left ventricular diastolic parameters are indeterminate. Right Ventricle: The right ventricular size is normal. Right vetricular wall thickness was not assessed. Right ventricular systolic function is normal. Left Atrium: Left atrial size was normal in size. Right Atrium: Right atrial size was normal in size. Pericardium: There is no evidence of pericardial effusion. Mitral Valve: There is mild thickening of the mitral valve leaflet(s). There is mild calcification of the mitral valve leaflet(s). Trivial mitral valve regurgitation. Tricuspid Valve: The tricuspid valve is normal in structure. Tricuspid valve regurgitation is trivial. Aortic Valve: The aortic valve is tricuspid. Aortic valve regurgitation is not visualized. Aortic valve sclerosis is present, with no evidence of aortic valve stenosis. Aortic valve mean gradient measures 4.0  mmHg. Aortic valve peak gradient measures 6.6  mmHg. Aortic valve area, by VTI measures 2.65 cm. Pulmonic Valve: The pulmonic valve was normal in structure. Pulmonic valve regurgitation is not visualized. Aorta: The aortic root and ascending aorta are structurally normal, with no evidence of dilitation. Venous: The inferior vena cava is normal in size with greater than 50% respiratory variability, suggesting right atrial pressure of 3 mmHg. IAS/Shunts: No atrial level shunt detected by color flow Doppler.  LEFT VENTRICLE PLAX 2D LVIDd:         3.80 cm   Diastology LVIDs:         2.50 cm   LV e' medial:    6.96 cm/s LV PW:         0.70 cm   LV E/e' medial:  10.4 LV IVS:        1.30 cm   LV e' lateral:   9.25 cm/s LVOT diam:     2.00 cm   LV E/e' lateral: 7.8 LV SV:         70 LV SV Index:   34 LVOT Area:     3.14 cm  RIGHT VENTRICLE             IVC RV Basal diam:  2.30 cm     IVC diam: 1.40 cm RV Mid diam:    2.50 cm RV S prime:     18.70 cm/s TAPSE (M-mode): 2.4 cm LEFT ATRIUM             Index        RIGHT ATRIUM           Index LA diam:        3.00 cm 1.48 cm/m   RA Area:     11.40 cm LA Vol (A2C):   33.4 ml 16.45 ml/m  RA Volume:   19.60 ml  9.65 ml/m LA Vol (A4C):   39.3 ml 19.35 ml/m LA Biplane Vol: 36.5 ml 17.97 ml/m  AORTIC VALVE                    PULMONIC VALVE AV Area (Vmax):    2.80 cm     PV Vmax:       0.90 m/s AV Area (Vmean):   2.43 cm     PV Peak grad:  3.2 mmHg AV Area (VTI):     2.65 cm AV Vmax:           128.00 cm/s AV Vmean:          93.800 cm/s AV VTI:            0.264 m AV  Peak Grad:      6.6 mmHg AV Mean Grad:      4.0 mmHg LVOT Vmax:         114.00 cm/s LVOT Vmean:        72.600 cm/s LVOT VTI:          0.223 m LVOT/AV VTI ratio: 0.84  AORTA Ao Root diam: 3.40 cm Ao Asc diam:  3.80 cm MITRAL VALVE                TRICUSPID VALVE MV Area (PHT): 7.22 cm     TR Peak grad:   26.0 mmHg MV Decel Time: 105 msec     TR Vmax:        255.00 cm/s MV E velocity: 72.40 cm/s MV A velocity: 114.00  cm/s  SHUNTS MV E/A ratio:  0.64         Systemic VTI:  0.22 m                             Systemic Diam: 2.00 cm Dorris Carnes MD Electronically signed by Dorris Carnes MD Signature Date/Time: 01/07/2022/11:57:51 AM    Final    MR BRAIN WO CONTRAST  Result Date: 01/07/2022 CLINICAL DATA:  Follow-up examination for acute stroke. EXAM: MRI HEAD WITHOUT CONTRAST TECHNIQUE: Multiplanar, multiecho pulse sequences of the brain and surrounding structures were obtained without intravenous contrast. COMPARISON:  Prior CTs from earlier the same day. FINDINGS: Brain: Cerebral volume within normal limits. Scattered patchy T2/FLAIR hyperintensity involving the periventricular and deep white matter both cerebral hemispheres, most consistent with chronic small vessel ischemic disease, mild to moderate in nature. 1.2 cm acute ischemic nonhemorrhagic infarcts seen involving the right thalamocapsular region (series 5, image 74). No significant mass effect. No other evidence for acute or subacute ischemia. Gray-white matter differentiation otherwise maintained. No areas of chronic cortical infarction. No acute or chronic intracranial blood products. No mass lesion, mass effect or midline shift. No hydrocephalus or extra-axial fluid collection. Pituitary gland suprasellar region within normal limits. Midline structures intact and normally formed. Vascular: Major intracranial vascular flow voids are maintained. Skull and upper cervical spine: Craniocervical junction within normal limits. Bone marrow signal intensity normal. Partial segmental fusion of the C3 and C4 vertebral bodies noted. No scalp soft tissue abnormality. Sinuses/Orbits: Prior bilateral ocular lens replacement. Paranasal sinuses are largely clear. No mastoid effusion. Other: None. IMPRESSION: 1. 1.2 cm acute ischemic nonhemorrhagic right thalamocapsular infarct. 2. Underlying mild to moderate chronic microvascular ischemic disease. Electronically Signed   By: Jeannine Boga M.D.   On: 01/07/2022 00:20   CT HEAD CODE STROKE WO CONTRAST  Result Date: 01/06/2022 CLINICAL DATA:  Neuro deficit, acute, stroke suspected EXAM: CT HEAD WITHOUT CONTRAST TECHNIQUE: Contiguous axial images were obtained from the base of the skull through the vertex without intravenous contrast. RADIATION DOSE REDUCTION: This exam was performed according to the departmental dose-optimization program which includes automated exposure control, adjustment of the mA and/or kV according to patient size and/or use of iterative reconstruction technique. COMPARISON:  04/10/2021 FINDINGS: Brain: There is no acute intracranial hemorrhage, mass effect, or edema. No new loss of gray-white differentiation. Ventricles and sulci are stable in size and configuration. Patchy hypoattenuation in the supratentorial white matter is nonspecific but may reflect similar mild chronic microvascular ischemic changes. There is no extra-axial collection. Vascular: No hyperdense vessel. Skull: Unremarkable. Sinuses/Orbits: No acute finding. Other: Mastoid air cells are clear. ASPECTS [facility]  Stroke Program Early CT Score) - Ganglionic level infarction (caudate, lentiform nuclei, internal capsule, insula, M1-M3 cortex): 7 - Supraganglionic infarction (M4-M6 cortex): 3 Total score (0-10 with 10 being normal): 10 IMPRESSION: There is no acute intracranial hemorrhage or evidence of acute infarction. Mild chronic microvascular ischemic changes. Electronically Signed   By: Macy Mis M.D.   On: 01/06/2022 18:10     PHYSICAL EXAM  Temp:  [97.8 F (36.6 C)-99.5 F (37.5 C)] 99.1 F (37.3 C) (06/18 0629) Pulse Rate:  [81-100] 81 (06/18 0629) Resp:  [16-20] 20 (06/18 0629) BP: (127-159)/(72-93) 130/83 (06/18 0629) SpO2:  [92 %-99 %] 97 % (06/18 0629)  General - Well nourished, well developed, in no apparent distress.  Ophthalmologic - fundi not visualized due to noncooperation.  Cardiovascular - Regular rhythm and  rate.  Neuro - awake and alert sitting up in bed in NAD. She is alert and oriented x4, mild dysarthria, mild left facial droop. Tracks, EOMI, visual fields full to finger count. Left arm and left lower 4/5, with no drift decreased  fine motor skills on left hand right upper and lower 5/5. Decreased sensation on left face, arm and lower. gait not tested.   NIHSS 2 premorbid modified Rankin score 0  ASSESSMENT/PLAN Ms. Shelby Morgan is a 66 y.o. female with history of hypertension, hyperlipidemia, diabetes, obesity, Bell's palsy, and stroke in 03/2021 admitted for left-sided numbness and weakness.  Status post TNK.  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 Ongoing aggressive stroke risk factor management Therapy recommendations: CIR. Awaiting insurance approval Disposition: Pending  History of stroke 03/2021 MRI showed left thalamic infarct.  CTA head and neck right PCA occlusion, left P2 moderate stenosis, left P3 occlusion.  LDL 52, A1c 6.4.  Discharged on DAPT for 3 weeks and then aspirin alone.  Also discharged on Zocor 40.  Diabetes HgbA1c 7.1 goal < 7.0 Uncontrolled CBG monitoring SSI DM education and close PCP follow up Dietitian consult for DM diet education  Hypertension Stable BP goal less than 180/105  On home lasix 20 Long term BP goal normotensive  Hyperlipidemia Home meds: Zocor 40 LDL 82, goal < 70 Now on Crestor 20 Continue statin at discharge  Other Stroke Risk  Factors Advanced age Obesity, Body mass index is 42.18 kg/m.   Other Active Problems History of Bell's palsy Melanoma in 2009 Leukcytosis - WBC 12.6, will monitor - afebrile   Hospital day # 5  Beulah Gandy DNP, ACNPC-AG   ATTENDING NOTE: I reviewed above note and agree with the assessment and plan. Pt was seen and examined.   No family at bedside.  Patient seen lying in bed, initially sleeping, however easily arousable, follows simple commands, no aphasia.  Left-sided weakness continue to improve gradually and slowly.  No acute event overnight.  Continue current management.  Pending insurance approval for CIR.  For detailed assessment and plan, please refer to above as I have made changes wherever appropriate.   Rosalin Hawking, MD PhD Stroke Neurology 01/11/2022 12:57 PM  To contact Stroke Continuity provider, please refer to [email or web address] After hours, contact General Neurology

## 2022-01-11 NOTE — Plan of Care (Signed)
  Problem: Education: Goal: Knowledge of General Education information will improve Description: Including pain rating scale, medication(s)/side effects and non-pharmacologic comfort measures Outcome: Progressing   Problem: Health Behavior/Discharge Planning: Goal: Ability to manage health-related needs will improve Outcome: Progressing   Problem: Clinical Measurements: Goal: Ability to maintain clinical measurements within normal limits will improve Outcome: Progressing Goal: Will remain free from infection Outcome: Progressing Goal: Diagnostic test results will improve Outcome: Progressing Goal: Respiratory complications will improve Outcome: Progressing Goal: Cardiovascular complication will be avoided Outcome: Progressing   Problem: Activity: Goal: Risk for activity intolerance will decrease Outcome: Progressing   Problem: Nutrition: Goal: Adequate nutrition will be maintained Outcome: Progressing   Problem: Coping: Goal: Level of anxiety will decrease Outcome: Progressing   Problem: Elimination: Goal: Will not experience complications related to bowel motility Outcome: Progressing Goal: Will not experience complications related to urinary retention Outcome: Progressing   Problem: Pain Managment: Goal: General experience of comfort will improve Outcome: Progressing   Problem: Safety: Goal: Ability to remain free from injury will improve Outcome: Progressing   Problem: Skin Integrity: Goal: Risk for impaired skin integrity will decrease Outcome: Progressing   Problem: Education: Goal: Knowledge of secondary prevention will improve (SELECT ALL) Outcome: Progressing   Problem: Education: Goal: Ability to describe self-care measures that may prevent or decrease complications (Diabetes Survival Skills Education) will improve Outcome: Progressing Goal: Individualized Educational Video(s) Outcome: Progressing   Problem: Coping: Goal: Ability to adjust to  condition or change in health will improve Outcome: Progressing   Problem: Fluid Volume: Goal: Ability to maintain a balanced intake and output will improve Outcome: Progressing   Problem: Health Behavior/Discharge Planning: Goal: Ability to identify and utilize available resources and services will improve Outcome: Progressing Goal: Ability to manage health-related needs will improve Outcome: Progressing   Problem: Metabolic: Goal: Ability to maintain appropriate glucose levels will improve Outcome: Progressing   Problem: Nutritional: Goal: Maintenance of adequate nutrition will improve Outcome: Progressing Goal: Progress toward achieving an optimal weight will improve Outcome: Progressing   Problem: Skin Integrity: Goal: Risk for impaired skin integrity will decrease Outcome: Progressing   Problem: Tissue Perfusion: Goal: Adequacy of tissue perfusion will improve Outcome: Progressing   Problem: Education: Goal: Knowledge of secondary prevention will improve (SELECT ALL) Outcome: Progressing Goal: Knowledge of patient specific risk factors will improve (INDIVIDUALIZE FOR PATIENT) Outcome: Progressing   Problem: Ischemic Stroke/TIA Tissue Perfusion: Goal: Complications of ischemic stroke/TIA will be minimized Outcome: Progressing

## 2022-01-11 NOTE — Plan of Care (Signed)
Pt is alert oriented x 4. Pt has pure wick in place or urine output. Pt had sleep study during night. Left sided weakness, numbness to left side.  No distress noted.    Problem: Education: Goal: Knowledge of General Education information will improve Description: Including pain rating scale, medication(s)/side effects and non-pharmacologic comfort measures Outcome: Progressing   Problem: Health Behavior/Discharge Planning: Goal: Ability to manage health-related needs will improve Outcome: Progressing   Problem: Clinical Measurements: Goal: Ability to maintain clinical measurements within normal limits will improve Outcome: Progressing Goal: Will remain free from infection Outcome: Progressing Goal: Diagnostic test results will improve Outcome: Progressing Goal: Respiratory complications will improve Outcome: Progressing Goal: Cardiovascular complication will be avoided Outcome: Progressing   Problem: Activity: Goal: Risk for activity intolerance will decrease Outcome: Progressing   Problem: Nutrition: Goal: Adequate nutrition will be maintained Outcome: Progressing   Problem: Coping: Goal: Level of anxiety will decrease Outcome: Progressing   Problem: Elimination: Goal: Will not experience complications related to bowel motility Outcome: Progressing Goal: Will not experience complications related to urinary retention Outcome: Progressing   Problem: Pain Managment: Goal: General experience of comfort will improve Outcome: Progressing   Problem: Safety: Goal: Ability to remain free from injury will improve Outcome: Progressing   Problem: Education: Goal: Knowledge of secondary prevention will improve (SELECT ALL) Outcome: Progressing   Problem: Skin Integrity: Goal: Risk for impaired skin integrity will decrease Outcome: Progressing   Problem: Education: Goal: Knowledge of secondary prevention will improve (SELECT ALL) Outcome: Progressing Goal: Knowledge of  patient specific risk factors will improve (INDIVIDUALIZE FOR PATIENT) Outcome: Progressing   Problem: Ischemic Stroke/TIA Tissue Perfusion: Goal: Complications of ischemic stroke/TIA will be minimized Outcome: Progressing   Problem: Education: Goal: Ability to describe self-care measures that may prevent or decrease complications (Diabetes Survival Skills Education) will improve Outcome: Progressing Goal: Individualized Educational Video(s) Outcome: Progressing   Problem: Coping: Goal: Ability to adjust to condition or change in health will improve Outcome: Progressing   Problem: Fluid Volume: Goal: Ability to maintain a balanced intake and output will improve Outcome: Progressing   Problem: Health Behavior/Discharge Planning: Goal: Ability to identify and utilize available resources and services will improve Outcome: Progressing Goal: Ability to manage health-related needs will improve Outcome: Progressing   Problem: Metabolic: Goal: Ability to maintain appropriate glucose levels will improve Outcome: Progressing   Problem: Nutritional: Goal: Maintenance of adequate nutrition will improve Outcome: Progressing Goal: Progress toward achieving an optimal weight will improve Outcome: Progressing   Problem: Skin Integrity: Goal: Risk for impaired skin integrity will decrease Outcome: Progressing   Problem: Tissue Perfusion: Goal: Adequacy of tissue perfusion will improve Outcome: Progressing

## 2022-01-12 DIAGNOSIS — I63331 Cerebral infarction due to thrombosis of right posterior cerebral artery: Secondary | ICD-10-CM | POA: Diagnosis not present

## 2022-01-12 LAB — GLUCOSE, CAPILLARY
Glucose-Capillary: 190 mg/dL — ABNORMAL HIGH (ref 70–99)
Glucose-Capillary: 269 mg/dL — ABNORMAL HIGH (ref 70–99)
Glucose-Capillary: 180 mg/dL — ABNORMAL HIGH (ref 70–99)
Glucose-Capillary: 211 mg/dL — ABNORMAL HIGH (ref 70–99)

## 2022-01-12 LAB — BASIC METABOLIC PANEL
Anion gap: 11 (ref 5–15)
BUN: 23 mg/dL (ref 8–23)
CO2: 22 mmol/L (ref 22–32)
Calcium: 9 mg/dL (ref 8.9–10.3)
Chloride: 102 mmol/L (ref 98–111)
Creatinine, Ser: 1.27 mg/dL — ABNORMAL HIGH (ref 0.44–1.00)
GFR, Estimated: 47 mL/min — ABNORMAL LOW (ref >60)
Glucose, Bld: 208 mg/dL — ABNORMAL HIGH (ref 70–99)
Potassium: 4 mmol/L (ref 3.5–5.1)
Sodium: 135 mmol/L (ref 135–145)

## 2022-01-12 LAB — CBC
HCT: 36.9 % (ref 36.0–46.0)
Hemoglobin: 12.2 g/dL (ref 12.0–15.0)
MCH: 27.9 pg (ref 26.0–34.0)
MCHC: 33.1 g/dL (ref 30.0–36.0)
MCV: 84.4 fL (ref 80.0–100.0)
Platelets: 332 10*3/uL (ref 150–400)
RBC: 4.37 MIL/uL (ref 3.87–5.11)
RDW: 13.6 % (ref 11.5–15.5)
WBC: 10.7 10*3/uL — ABNORMAL HIGH (ref 4.0–10.5)
nRBC: 0 % (ref 0.0–0.2)

## 2022-01-12 NOTE — Progress Notes (Signed)
Inpatient Rehab Admissions Coordinator:   I do not have insurance auth or a CIR bed for this Pt. Today. I will continue to follow for potential admit pending insurance auth and bed availability.   Louisa Favaro, MS, CCC-SLP Rehab Admissions Coordinator  [phone] (celll) [phone] (office)  

## 2022-01-12 NOTE — Progress Notes (Addendum)
Occupational Therapy Treatment Patient Details Name: Shelby Morgan MRN: [id number] DOB: 14-Mar-1956 Today's Date: 01/12/2022   History of present illness pt is a 66 y/o female admitted 6/13 with Left sided numbness and weakness.  MRI shows a 1.2 cm acute right thalamocapsular infarct--non hemorrhagic.Marland Kitchen  PMHx Bells palsy, HLD, DM, HTN, melanoma, Left thalamic stroke 2022, NAFLD   OT comments  Pt progressing towards goals this session, completes bed mobility with min A, mod A for sit to stand transfer with Stedy, and min-mod A for seated ADLs. Pt able to perform LUE exercises sitting EOB, provided handout with fine motor coordination exercises and squeeze ball at end of session. Pt with increased sensation in L hand, able to identify areas lightly touched by therapist, unable to feel deep pressure. Educated pt on strategies to improve sensation in LUE, pt verbalized understanding. Pt presenting with impairments listed below, will follow acutely. Continue to recommend AIR at d/c.   Recommendations for follow up therapy are one component of a multi-disciplinary discharge planning process, led by the attending physician.  Recommendations may be updated based on patient status, additional functional criteria and insurance authorization.    Follow Up Recommendations  Acute inpatient rehab (3hours/day)    Assistance Recommended at Discharge Frequent or constant Supervision/Assistance  Patient can return home with the following  Two people to help with walking and/or transfers;Two people to help with bathing/dressing/bathroom;Direct supervision/assist for medications management;Assistance with cooking/housework;Direct supervision/assist for financial management;Assist for transportation;Help with stairs or ramp for entrance   Equipment Recommendations  None recommended by OT;Other (comment) (defer to next venue of care)    Recommendations for Other Services Rehab consult    Precautions / Restrictions  Precautions Precautions: Fall Precaution Comments: BP parameters 180/105 Restrictions Weight Bearing Restrictions: No LLE Weight Bearing: Weight bearing as tolerated       Mobility Bed Mobility Overal bed mobility: Needs Assistance Bed Mobility: Supine to Sit     Supine to sit: Min assist     General bed mobility comments: for trunk elevation    Transfers Overall transfer level: Needs assistance Equipment used: Ambulation equipment used Transfers: Sit to/from Stand, Bed to chair/wheelchair/BSC Sit to Stand: Mod assist             Transfer via Lift Equipment: Stedy   Balance Overall balance assessment: Needs assistance Sitting-balance support: No upper extremity supported, Feet supported Sitting balance-Leahy Scale: Fair     Standing balance support: Bilateral upper extremity supported, During functional activity Standing balance-Leahy Scale: Poor Standing balance comment: reliant on external support                           ADL either performed or assessed with clinical judgement   ADL Overall ADL's : Needs assistance/impaired     Grooming: Set up;Sitting Grooming Details (indicate cue type and reason): increased time for fine motor aspects of task         Upper Body Dressing : Minimal assistance;Sitting Upper Body Dressing Details (indicate cue type and reason): to don gown Lower Body Dressing: Moderate assistance;Sitting/lateral leans Lower Body Dressing Details (indicate cue type and reason): to don socks EOB Toilet Transfer: Moderate assistance Toilet Transfer Details (indicate cue type and reason): with use of Stedy         Functional mobility during ADLs: Moderate assistance General ADL Comments: fatigues quickly    Extremity/Trunk Assessment Upper Extremity Assessment RUE Deficits / Details: hx of CVA with weakness 3/5  MMT, slow movements but coordiated RUE Sensation: decreased light touch RUE Coordination: WNL LUE Deficits /  Details: grosly 3-/5 MMT, poor coordination, proprioception and no sensation. pt using functionally during ADL tasks, but pt unable to control UE at times mobility tasks LUE Sensation: decreased light touch;decreased proprioception LUE Coordination: decreased fine motor;decreased gross motor   Lower Extremity Assessment Lower Extremity Assessment: Defer to PT evaluation        Vision   Vision Assessment?: No apparent visual deficits   Perception Perception Perception: Not tested   Praxis Praxis Praxis: Not tested    Cognition Arousal/Alertness: Awake/alert Behavior During Therapy: WFL for tasks assessed/performed Overall Cognitive Status: Within Functional Limits for tasks assessed Area of Impairment: Following commands, Awareness, Problem solving, Attention, Safety/judgement                   Current Attention Level: Sustained   Following Commands: Follows multi-step commands with increased time Safety/Judgement: Decreased awareness of safety Awareness: Emergent Problem Solving: Slow processing, Requires verbal cues, Difficulty sequencing, Requires tactile cues          Exercises Exercises: Other exercises Other Exercises Other Exercises: L hand digit opposition x10 Other Exercises: L digit abduction x10 Other Exercises: L digit adduction x10    Shoulder Instructions       General Comments VSS on RA; educated pt on sensory strategies, using washcloth, appling lotion to LUE    Pertinent Vitals/ Pain       Pain Assessment Pain Assessment: Faces Pain Score: 3  Faces Pain Scale: Hurts little more Pain Location: R heel Pain Descriptors / Indicators: Discomfort Pain Intervention(s): Limited activity within patient's tolerance, Monitored during session, Repositioned  Home Living                                          Prior Functioning/Environment              Frequency  Min 2X/week        Progress Toward Goals  OT  Goals(current goals can now be found in the care plan section)  Progress towards OT goals: Progressing toward goals  Acute Rehab OT Goals Patient Stated Goal: to get to rehab OT Goal Formulation: With patient Time For Goal Achievement: 01/21/22 Potential to Achieve Goals: Good ADL Goals Pt Will Perform Grooming: with supervision;with set-up;sitting Pt Will Perform Upper Body Bathing: with supervision;sitting Pt Will Perform Lower Body Dressing: with min assist;sit to/from stand Pt Will Transfer to Toilet: with mod assist;bedside commode;stand pivot transfer Pt/caregiver will Perform Home Exercise Program: Increased strength;Left upper extremity;With written HEP provided  Plan Discharge plan remains appropriate;Frequency remains appropriate    Co-evaluation                 AM-PAC OT "6 Clicks" Daily Activity     Outcome Measure   Help from another person eating meals?: A Little Help from another person taking care of personal grooming?: A Little Help from another person toileting, which includes using toliet, bedpan, or urinal?: A Lot Help from another person bathing (including washing, rinsing, drying)?: A Lot Help from another person to put on and taking off regular upper body clothing?: A Little Help from another person to put on and taking off regular lower body clothing?: A Lot 6 Click Score: 15    End of Session Equipment Utilized During Treatment: Gait belt  OT Visit  Diagnosis: Other abnormalities of gait and mobility (R26.89);Other symptoms and signs involving cognitive function   Activity Tolerance Patient tolerated treatment well   Patient Left in chair;with call bell/phone within reach;with chair alarm set   Nurse Communication Mobility status        Time: 4709-2957 OT Time Calculation (min): 35 min  Charges: OT General Charges $OT Visit: 1 Visit OT Treatments $Self Care/Home Management : 8-22 mins $Therapeutic Exercise: 8-22 mins  Lynnda Child,  OTD, OTR/L Acute Rehab [phone]   Kaylyn Lim 01/12/2022, 3:39 PM

## 2022-01-12 NOTE — Progress Notes (Signed)
STROKE TEAM PROGRESS NOTE   SUBJECTIVE (INTERVAL HISTORY) Patient is awake and alert sitting up in bed in NAD.  Neurological exam remains unchanged with mild left hemiparesis and sensory loss.  Did not wear the CPAP for enough.  Of time last night as part of the sleep smart study and will have to repeat it tonight.  No new neurological events overnight. No voiced complaints. OBJECTIVE Temp:  [98.2 F (36.8 C)-99.6 F (37.6 C)] 98.7 F (37.1 C) (06/19 0753) Pulse Rate:  [81-99] 90 (06/19 0753) Cardiac Rhythm: Normal sinus rhythm;Bundle branch block (06/19 0700) Resp:  [16-20] 17 (06/19 0753) BP: (126-148)/(61-87) 128/73 (06/19 0753) SpO2:  [95 %-100 %] 95 % (06/19 0753) Weight:  [104.1 kg] 104.1 kg (06/19 0544)  Recent Labs  Lab 01/11/22 1234 01/11/22 1645 01/11/22 2155 01/12/22 0624 01/12/22 1125  GLUCAP 181* 199* 225* 190* 269*   Recent Labs  Lab 01/08/22 0904 01/09/22 0226 01/10/22 0248 01/11/22 0307 01/12/22 0343  NA 142 140 136 137 135  K 3.9 4.3 3.7 4.1 4.0  CL 103 101 100 99 102  CO2 '26 27 25 26 22  '$ GLUCOSE 215* 173* 170* 214* 208*  BUN '14 19 17 20 23  '$ CREATININE 0.95 1.04* 1.05* 1.17* 1.27*  CALCIUM 9.6 9.5 9.4 9.4 9.0   Recent Labs  Lab 01/06/22 1821  AST 36  ALT 38  ALKPHOS 61  BILITOT 0.7  PROT 7.7  ALBUMIN 3.8   Recent Labs  Lab 01/06/22 1821 01/06/22 1831 01/08/22 0904 01/09/22 0226 01/10/22 0248 01/11/22 0307 01/12/22 0343  WBC 8.3  --  8.0 10.5 9.4 12.6* 10.7*  NEUTROABS 5.1  --   --   --   --   --   --   HGB 13.1   < > 12.7 12.3 13.1 12.9 12.2  HCT 39.7   < > 39.2 38.5 40.4 38.7 36.9  MCV 83.9  --  84.7 84.8 84.7 83.9 84.4  PLT 389  --  345 362 362 376 332   < > = values in this interval not displayed.   No results for input(s): "CKTOTAL", "CKMB", "CKMBINDEX", "TROPONINI" in the last 168 hours. No results for input(s): "LABPROT", "INR" in the last 72 hours.  No results for input(s): "COLORURINE", "LABSPEC", "PHURINE", "GLUCOSEU",  "HGBUR", "BILIRUBINUR", "KETONESUR", "PROTEINUR", "UROBILINOGEN", "NITRITE", "LEUKOCYTESUR" in the last 72 hours.  Invalid input(s): "APPERANCEUR"      Component Value Date/Time   CHOL 156 01/07/2022 0418   TRIG 107 01/07/2022 0418   HDL 53 01/07/2022 0418   CHOLHDL 2.9 01/07/2022 0418   VLDL 21 01/07/2022 0418   LDLCALC 82 01/07/2022 0418   Lab Results  Component Value Date   HGBA1C 7.1 (H) 01/07/2022      Component Value Date/Time   LABOPIA NONE DETECTED 01/06/2022 1927   COCAINSCRNUR NONE DETECTED 01/06/2022 1927   LABBENZ NONE DETECTED 01/06/2022 1927   AMPHETMU NONE DETECTED 01/06/2022 1927   THCU NONE DETECTED 01/06/2022 1927   LABBARB NONE DETECTED 01/06/2022 1927    Recent Labs  Lab 01/06/22 1821  ETH <10    I have personally reviewed the radiological images below and agree with the radiology interpretations.  CT ANGIO HEAD NECK W WO CM  Result Date: 01/08/2022 CLINICAL DATA:  Follow-up examination for acute stroke. EXAM: CT ANGIOGRAPHY HEAD AND NECK TECHNIQUE: Multidetector CT imaging of the head and neck was performed using the standard protocol during bolus administration of intravenous contrast. Multiplanar CT image reconstructions and MIPs were  obtained to evaluate the vascular anatomy. Carotid stenosis measurements (when applicable) are obtained utilizing NASCET criteria, using the distal internal carotid diameter as the denominator. RADIATION DOSE REDUCTION: This exam was performed according to the departmental dose-optimization program which includes automated exposure control, adjustment of the mA and/or kV according to patient size and/or use of iterative reconstruction technique. CONTRAST:  72m OMNIPAQUE IOHEXOL 350 MG/ML SOLN COMPARISON:  Prior MRI from 01/06/2022 as well as prior CTA from 04/10/2021 and chest CT from 06/06/2018. FINDINGS: CT HEAD FINDINGS Brain: There has been continued interval evolution of previously identified acute infarct involving the  right thalamic capsular region, stable in size as compared to previous MRI. No associated mass effect or evidence for hemorrhagic transformation. No other acute intracranial hemorrhage or large vessel territory infarct. Underlying atrophy with mild chronic small vessel ischemic disease noted. Small remote left thalamic lacunar infarct noted. No mass lesion or midline shift. No hydrocephalus or extra-axial fluid collection. Vascular: No hyperdense vessel. Scattered vascular calcifications noted within the carotid siphons. Skull: Scalp soft tissues and calvarium within normal limits. Sinuses: Paranasal sinuses are largely clear.  No mastoid effusion. Orbits: Globes orbital soft tissues demonstrate no acute finding. Review of the MIP images confirms the above findings CTA NECK FINDINGS Aortic arch: Visualized aortic arch normal in caliber with standard 3 vessel branching pattern. Mild plaque within the arch itself. No stenosis about the origin of the great vessels. Right carotid system: Right common and internal carotid arteries patent without stenosis or dissection. Mild for age plaque about the right carotid bulb without stenosis. Left carotid system: Left common and internal carotid arteries patent without stenosis or dissection. Mild for age plaque about the left carotid bulb without significant stenosis. Vertebral arteries: Both vertebral arteries arise from the subclavian arteries. No proximal subclavian artery stenosis. Both vertebral arteries widely patent without stenosis, dissection or occlusion. Skeleton: No discrete or worrisome osseous lesions. Segmental fusion of the C3 and C4 vertebral bodies noted. Moderate spondylosis noted at C5-6 and C6-7. Other neck: No other acute soft tissue abnormality within the neck. 8 mm soft tissue nodule present at the superior aspect left parotid gland, indeterminate. Few additional scattered subcentimeter nodular densities about the bilateral parotid glands favored to  reflect intraparotid lymph nodes. 9 mm left thyroid nodule, of doubtful significance given size and patient age, no follow-up imaging recommended (ref: J Am Coll Radiol. 2015 Feb;12(2): 143-50). Upper chest: Visualized upper chest demonstrates no acute finding. Irregular soft tissue density partially visualized at the level of the AP window (series 5, image 161), likely adenopathy as seen on prior studies. This appears somewhat increased in size measuring up to 2.5 cm (previously 1.9 cm on prior chest CT from 06/06/2018. Review of the MIP images confirms the above findings CTA HEAD FINDINGS Anterior circulation: Petrous segments patent bilaterally. Atheromatous change within the carotid siphons without hemodynamically significant stenosis. A1 segments, anterior communicating artery complex common anterior cerebral arteries patent without stenosis. No M1 stenosis or occlusion. No proximal MCA branch occlusion. Distal left MCA branches widely patent. There is a focal severe proximal right M3 stenosis (series 13, image 76). Right MCA branches otherwise patent and well perfused. Posterior circulation: Both V4 segments patent without significant stenosis. Left PICA patent. Right PICA origin not well seen. Basilar patent to its distal aspect without stenosis superior cerebellar arteries patent proximally. Both PCAs primarily supplied via the basilar. Focal moderate proximal left P2 stenosis (series 12, image 117). Additional severe distal left P3 stenoses (series 12,  image 137). Left PCA attenuated but patent distally. On the right, there is a focal severe right P2 stenosis (series 12, image 115) right PCA mildly irregular but otherwise patent to its distal aspect. Venous sinuses: Patent allowing for timing the contrast bolus. Anatomic variants: None significant.  No aneurysm. Review of the MIP images confirms the above findings IMPRESSION: CT HEAD IMPRESSION: 1. Normal expected interval evolution of acute right thalamic  infarct, stable in size relative to previous MRI. No evidence for hemorrhagic transformation or other complication. 2. No other new acute intracranial abnormality. 3. Underlying atrophy with chronic small vessel ischemic disease. CTA HEAD AND NECK: 1. Negative CTA for large vessel occlusion. 2. Intracranial atherosclerotic disease with multifocal moderate to severe right M3, bilateral P2, and distal left P3 stenoses as above. No proximal high-grade or correctable stenosis. Overall, appearance is similar as compared to prior CTA from 04/10/2021. 3. Wide patency of the major arterial vasculature within the neck. 4. Mediastinal adenopathy, partially visualized and incompletely assessed on this exam. Further evaluation with dedicated cross-sectional imaging of the chest suggested for complete evaluation. 5. 8 mm nodular lesion within the superior left parotid gland, indeterminate. While this may reflect an intraparotid lymph node, a possible small primary salivary neoplasm could also have this appearance. Nonemergent outpatient ENT referral for further workup suggested. Electronically Signed   By: Jeannine Boga M.D.   On: 01/08/2022 05:24   ECHOCARDIOGRAM COMPLETE  Result Date: 01/07/2022    ECHOCARDIOGRAM REPORT   Patient Name:   Shelby Morgan Date of Exam: 01/07/2022 Medical Rec #:  [id number]      Height:       62.0 in Accession #:    [id number]     Weight:       230.6 lb Date of Birth:  06-06-1956      BSA:          2.031 m Patient Age:    3 years       BP:           161/93 mmHg Patient Gender: F              HR:           82 bpm. Exam Location:  Inpatient Procedure: 2D Echo, Cardiac Doppler and Color Doppler Indications:    Stroke  History:        Patient has prior history of Echocardiogram examinations, most                 recent 04/11/2021. Risk Factors:Hypertension, Diabetes and HLD.  Sonographer:    Joette Catching RCS Referring Phys: [place]  1. Left ventricular ejection  fraction, by estimation, is 60 to 65%. The left ventricle has normal function. The left ventricle has no regional wall motion abnormalities. There is mild left ventricular hypertrophy. Left ventricular diastolic parameters are indeterminate.  2. Right ventricular systolic function is normal. The right ventricular size is normal.  3. Trivial mitral valve regurgitation.  4. The aortic valve is tricuspid. Aortic valve regurgitation is not visualized. Aortic valve sclerosis is present, with no evidence of aortic valve stenosis.  5. The inferior vena cava is normal in size with greater than 50% respiratory variability, suggesting right atrial pressure of 3 mmHg. Comparison(s): The left ventricular function is unchanged. FINDINGS  Left Ventricle: Left ventricular ejection fraction, by estimation, is 60 to 65%. The left ventricle has normal function. The left ventricle has no regional wall motion abnormalities. The left ventricular  internal cavity size was normal in size. There is  mild left ventricular hypertrophy. Left ventricular diastolic parameters are indeterminate. Right Ventricle: The right ventricular size is normal. Right vetricular wall thickness was not assessed. Right ventricular systolic function is normal. Left Atrium: Left atrial size was normal in size. Right Atrium: Right atrial size was normal in size. Pericardium: There is no evidence of pericardial effusion. Mitral Valve: There is mild thickening of the mitral valve leaflet(s). There is mild calcification of the mitral valve leaflet(s). Trivial mitral valve regurgitation. Tricuspid Valve: The tricuspid valve is normal in structure. Tricuspid valve regurgitation is trivial. Aortic Valve: The aortic valve is tricuspid. Aortic valve regurgitation is not visualized. Aortic valve sclerosis is present, with no evidence of aortic valve stenosis. Aortic valve mean gradient measures 4.0 mmHg. Aortic valve peak gradient measures 6.6  mmHg. Aortic valve area, by  VTI measures 2.65 cm. Pulmonic Valve: The pulmonic valve was normal in structure. Pulmonic valve regurgitation is not visualized. Aorta: The aortic root and ascending aorta are structurally normal, with no evidence of dilitation. Venous: The inferior vena cava is normal in size with greater than 50% respiratory variability, suggesting right atrial pressure of 3 mmHg. IAS/Shunts: No atrial level shunt detected by color flow Doppler.  LEFT VENTRICLE PLAX 2D LVIDd:         3.80 cm   Diastology LVIDs:         2.50 cm   LV e' medial:    6.96 cm/s LV PW:         0.70 cm   LV E/e' medial:  10.4 LV IVS:        1.30 cm   LV e' lateral:   9.25 cm/s LVOT diam:     2.00 cm   LV E/e' lateral: 7.8 LV SV:         70 LV SV Index:   34 LVOT Area:     3.14 cm  RIGHT VENTRICLE             IVC RV Basal diam:  2.30 cm     IVC diam: 1.40 cm RV Mid diam:    2.50 cm RV S prime:     18.70 cm/s TAPSE (M-mode): 2.4 cm LEFT ATRIUM             Index        RIGHT ATRIUM           Index LA diam:        3.00 cm 1.48 cm/m   RA Area:     11.40 cm LA Vol (A2C):   33.4 ml 16.45 ml/m  RA Volume:   19.60 ml  9.65 ml/m LA Vol (A4C):   39.3 ml 19.35 ml/m LA Biplane Vol: 36.5 ml 17.97 ml/m  AORTIC VALVE                    PULMONIC VALVE AV Area (Vmax):    2.80 cm     PV Vmax:       0.90 m/s AV Area (Vmean):   2.43 cm     PV Peak grad:  3.2 mmHg AV Area (VTI):     2.65 cm AV Vmax:           128.00 cm/s AV Vmean:          93.800 cm/s AV VTI:            0.264 m AV Peak Grad:  6.6 mmHg AV Mean Grad:      4.0 mmHg LVOT Vmax:         114.00 cm/s LVOT Vmean:        72.600 cm/s LVOT VTI:          0.223 m LVOT/AV VTI ratio: 0.84  AORTA Ao Root diam: 3.40 cm Ao Asc diam:  3.80 cm MITRAL VALVE                TRICUSPID VALVE MV Area (PHT): 7.22 cm     TR Peak grad:   26.0 mmHg MV Decel Time: 105 msec     TR Vmax:        255.00 cm/s MV E velocity: 72.40 cm/s MV A velocity: 114.00 cm/s  SHUNTS MV E/A ratio:  0.64         Systemic VTI:  0.22 m                              Systemic Diam: 2.00 cm Dorris Carnes MD Electronically signed by Dorris Carnes MD Signature Date/Time: 01/07/2022/11:57:51 AM    Final    MR BRAIN WO CONTRAST  Result Date: 01/07/2022 CLINICAL DATA:  Follow-up examination for acute stroke. EXAM: MRI HEAD WITHOUT CONTRAST TECHNIQUE: Multiplanar, multiecho pulse sequences of the brain and surrounding structures were obtained without intravenous contrast. COMPARISON:  Prior CTs from earlier the same day. FINDINGS: Brain: Cerebral volume within normal limits. Scattered patchy T2/FLAIR hyperintensity involving the periventricular and deep white matter both cerebral hemispheres, most consistent with chronic small vessel ischemic disease, mild to moderate in nature. 1.2 cm acute ischemic nonhemorrhagic infarcts seen involving the right thalamocapsular region (series 5, image 74). No significant mass effect. No other evidence for acute or subacute ischemia. Gray-white matter differentiation otherwise maintained. No areas of chronic cortical infarction. No acute or chronic intracranial blood products. No mass lesion, mass effect or midline shift. No hydrocephalus or extra-axial fluid collection. Pituitary gland suprasellar region within normal limits. Midline structures intact and normally formed. Vascular: Major intracranial vascular flow voids are maintained. Skull and upper cervical spine: Craniocervical junction within normal limits. Bone marrow signal intensity normal. Partial segmental fusion of the C3 and C4 vertebral bodies noted. No scalp soft tissue abnormality. Sinuses/Orbits: Prior bilateral ocular lens replacement. Paranasal sinuses are largely clear. No mastoid effusion. Other: None. IMPRESSION: 1. 1.2 cm acute ischemic nonhemorrhagic right thalamocapsular infarct. 2. Underlying mild to moderate chronic microvascular ischemic disease. Electronically Signed   By: Jeannine Boga M.D.   On: 01/07/2022 00:20   CT HEAD CODE STROKE WO  CONTRAST  Result Date: 01/06/2022 CLINICAL DATA:  Neuro deficit, acute, stroke suspected EXAM: CT HEAD WITHOUT CONTRAST TECHNIQUE: Contiguous axial images were obtained from the base of the skull through the vertex without intravenous contrast. RADIATION DOSE REDUCTION: This exam was performed according to the departmental dose-optimization program which includes automated exposure control, adjustment of the mA and/or kV according to patient size and/or use of iterative reconstruction technique. COMPARISON:  04/10/2021 FINDINGS: Brain: There is no acute intracranial hemorrhage, mass effect, or edema. No new loss of gray-white differentiation. Ventricles and sulci are stable in size and configuration. Patchy hypoattenuation in the supratentorial white matter is nonspecific but may reflect similar mild chronic microvascular ischemic changes. There is no extra-axial collection. Vascular: No hyperdense vessel. Skull: Unremarkable. Sinuses/Orbits: No acute finding. Other: Mastoid air cells are clear. ASPECTS [facility] Stroke Program Early CT Score) - Ganglionic  level infarction (caudate, lentiform nuclei, internal capsule, insula, M1-M3 cortex): 7 - Supraganglionic infarction (M4-M6 cortex): 3 Total score (0-10 with 10 being normal): 10 IMPRESSION: There is no acute intracranial hemorrhage or evidence of acute infarction. Mild chronic microvascular ischemic changes. Electronically Signed   By: Macy Mis M.D.   On: 01/06/2022 18:10     PHYSICAL EXAM  Temp:  [98.2 F (36.8 C)-99.6 F (37.6 C)] 98.7 F (37.1 C) (06/19 0753) Pulse Rate:  [81-99] 90 (06/19 0753) Resp:  [16-20] 17 (06/19 0753) BP: (126-148)/(61-87) 128/73 (06/19 0753) SpO2:  [95 %-100 %] 95 % (06/19 0753) Weight:  [104.1 kg] 104.1 kg (06/19 0544)  General - Well nourished, well developed, in no apparent distress.  Ophthalmologic - fundi not visualized due to noncooperation.  Cardiovascular - Regular rhythm and rate.  Neuro - awake  and alert sitting up in bed in NAD. She is alert and oriented x4, mild dysarthria, mild left facial droop. Tracks, EOMI, visual fields full to finger count. Left arm and left lower 4/5, with no drift decreased  fine motor skills on left hand right upper and lower 5/5. Decreased sensation on left face, arm and lower. gait not tested.   NIHSS 2 premorbid modified Rankin score 0  ASSESSMENT/PLAN Shelby Morgan is a 66 y.o. female with history of hypertension, hyperlipidemia, diabetes, obesity, Bell's palsy, and stroke in 03/2021 admitted for left-sided numbness and weakness.  Status post TNK.  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 Ongoing aggressive stroke risk factor management Therapy recommendations: CIR. Awaiting insurance approval Disposition: Pending  History of stroke 03/2021 MRI showed left thalamic infarct.  CTA head and neck right PCA occlusion, left P2 moderate stenosis, left P3 occlusion.  LDL 52, A1c 6.4.  Discharged on DAPT for 3 weeks and then aspirin alone.  Also discharged on Zocor 40.  Diabetes HgbA1c 7.1 goal < 7.0 Uncontrolled CBG monitoring SSI DM education and close PCP follow up Dietitian consult for DM diet education  Hypertension Stable BP goal less than 180/105  On home lasix 20 Long term BP goal normotensive  Hyperlipidemia Home meds: Zocor 40 LDL 82, goal < 70 Now on Crestor 20 Continue statin at discharge  Other Stroke Risk Factors Advanced age Obesity, Body  mass index is 41.98 kg/m.   Other Active Problems History of Bell's palsy Melanoma in 2009 Leukcytosis - WBC 12.6, will monitor - afebrile   Hospital day # 6   No family at bedside.  Patient seen lying in bed, initially sleeping, however easily arousable, follows simple commands, no aphasia.  Left-sided weakness continue to improve gradually and slowly.  No acute event overnight.  Continue current management.  Pending insurance approval for CIR. repeat CPAP mask tolerability test tonight as part of sleep smart study.  Greater than 50% time during this 35-minute visit was spent in counseling and coordination of care and discussion with patient and care team and answering questions.  Antony Contras, MD  Stroke Neurology 01/12/2022 1:33 PM  To contact Stroke Continuity provider, please refer to [email or web address] After hours, contact General Neurology

## 2022-01-12 NOTE — Progress Notes (Signed)
Physical Therapy Treatment Patient Details Name: Shelby Morgan MRN: [id number] DOB: Aug 03, 1955 Today's Date: 01/12/2022   History of Present Illness pt is a 66 y/o female admitted 6/13 with Left sided numbness and weakness.  MRI shows a 1.2 cm acute right thalamocapsular infarct--non hemorrhagic.Marland Kitchen  PMHx Bells palsy, HLD, DM, HTN, melanoma, Left thalamic stroke 2022, NAFLD    PT Comments    Pt progressing well toward goals, hindered by significantly limited sensory input on the L side.  Emphasis on scooting, safety and improved technique with sit to stands, pre-gait activity for control and progression of gait in the RW over short distances with stress on posture, proximity to the RW, toe off.    Recommendations for follow up therapy are one component of a multi-disciplinary discharge planning process, led by the attending physician.  Recommendations may be updated based on patient status, additional functional criteria and insurance authorization.  Follow Up Recommendations  Acute inpatient rehab (3hours/day)     Assistance Recommended at Discharge Frequent or constant Supervision/Assistance  Patient can return home with the following A little help with bathing/dressing/bathroom;Assistance with cooking/housework;Direct supervision/assist for medications management;Direct supervision/assist for financial management;Assist for transportation;Help with stairs or ramp for entrance;A lot of help with walking and/or transfers   Equipment Recommendations  Other (comment)    Recommendations for Other Services Rehab consult     Precautions / Restrictions Precautions Precautions: Fall Precaution Comments: BP parameters 180/105 Restrictions Weight Bearing Restrictions: No LLE Weight Bearing: Weight bearing as tolerated     Mobility  Bed Mobility               General bed mobility comments: up in the chair on arrival    Transfers Overall transfer level: Needs  assistance Equipment used: Rolling walker (2 wheels) Transfers: Sit to/from Stand Sit to Stand: Mod assist           General transfer comment: cues for hand placement/technique, assist for comeing forward, stability and boost.    Ambulation/Gait Ambulation/Gait assistance: +2 physical assistance, +2 safety/equipment Gait Distance (Feet): 8 Feet (then 12 feet and then 10 feet  with sitting rests in between) Assistive device: Rolling walker (2 wheels) Gait Pattern/deviations: Step-to pattern, Step-through pattern, Decreased step length - right, Decreased stride length   Gait velocity interpretation: <1.31 ft/sec, indicative of household ambulator   General Gait Details: Though pt still unable to feel her L U and LE's, pt is able to hold onto the RW and is showing improvement in swing though and placement/contact on the L LE.   Stairs             Wheelchair Mobility    Modified Rankin (Stroke Patients Only) Modified Rankin (Stroke Patients Only) Pre-Morbid Rankin Score: Slight disability Modified Rankin: Moderately severe disability     Balance Overall balance assessment: Needs assistance Sitting-balance support: No upper extremity supported, Feet supported Sitting balance-Leahy Scale: Fair       Standing balance-Leahy Scale: Poor Standing balance comment: reliant on external support and or AD                            Cognition Arousal/Alertness: Awake/alert Behavior During Therapy: WFL for tasks assessed/performed Overall Cognitive Status: Within Functional Limits for tasks assessed  Exercises      General Comments General comments (skin integrity, edema, etc.): VSS on RA; educated pt on sensory strategies, using washcloth, appling lotion to LUE      Pertinent Vitals/Pain Pain Assessment Pain Assessment: Faces Pain Score: 3  Pain Location: R heel Pain Descriptors / Indicators:  Discomfort Pain Intervention(s): Monitored during session    Home Living                          Prior Function            PT Goals (current goals can now be found in the care plan section) Acute Rehab PT Goals PT Goal Formulation: With patient Time For Goal Achievement: 01/21/22 Potential to Achieve Goals: Good Progress towards PT goals: Progressing toward goals    Frequency    Min 4X/week      PT Plan Current plan remains appropriate    Co-evaluation              AM-PAC PT "6 Clicks" Mobility   Outcome Measure  Help needed turning from your back to your side while in a flat bed without using bedrails?: A Lot Help needed moving from lying on your back to sitting on the side of a flat bed without using bedrails?: A Lot Help needed moving to and from a bed to a chair (including a wheelchair)?: A Lot Help needed standing up from a chair using your arms (e.g., wheelchair or bedside chair)?: A Lot Help needed to walk in hospital room?: Total Help needed climbing 3-5 steps with a railing? : Total 6 Click Score: 10    End of Session   Activity Tolerance: Patient tolerated treatment well Patient left: in chair;with call bell/phone within reach;with chair alarm set Nurse Communication: Mobility status PT Visit Diagnosis: Other abnormalities of gait and mobility (R26.89);Hemiplegia and hemiparesis;Other symptoms and signs involving the nervous system (R29.898) Hemiplegia - Right/Left: Left Hemiplegia - dominant/non-dominant: Non-dominant Hemiplegia - caused by: Cerebral infarction     Time: 5993-5701 PT Time Calculation (min) (ACUTE ONLY): 18 min  Charges:  $Gait Training: 8-22 mins                     01/12/2022  [name], PT Acute Rehabilitation Services [phone]  (pager) [phone]  (office)   Tessie Fass Jumar Greenstreet 01/12/2022, 5:40 PM

## 2022-01-12 NOTE — Progress Notes (Signed)
Inpatient Diabetes Program Recommendations  AACE/ADA: New Consensus Statement on Inpatient Glycemic Control (2015)  Target Ranges:  Prepandial:   less than 140 mg/dL      Peak postprandial:   less than 180 mg/dL (1-2 hours)      Critically ill patients:  140 - 180 mg/dL   Lab Results  Component Value Date   GLUCAP 190 (H) 01/12/2022   HGBA1C 7.1 (H) 01/07/2022    Diabetes history: DM 2 Outpatient Diabetes medications:  Januvia 100 mg daily, Metformin XR 1000 mg bid Current orders for Inpatient glycemic control:  Novolog 0-20 units tid with meals    Inpatient Diabetes Program Recommendations:    May consider adding Semglee 10 units daily while in the hospital.   Will continue to follow while inpatient.  Thank you, Reche Dixon, MSN, [place] [facility] Coordinator Inpatient Diabetes Program [phone] (team pager from 8a-5p)

## 2022-01-12 NOTE — Progress Notes (Signed)
Patient is using her CPAP from home with room air and nasal mask.

## 2022-01-12 NOTE — H&P (Signed)
Physical Medicine and Rehabilitation Admission H&P    Chief Complaint  Patient presents with   Functional deficits due to stroke    HPI: Shelby Morgan is a 66 year old female with history of HTN, NAFLD, HTN, T2DM, morbid obesity--BMI 41.9, melanoma (Dr. Nevada Crane), anxiety d/o, CVA 09/22; who was admitted via [facility] on 01/06/22 with sudden onset of severe numbness left face/arm/leg and weakness. CT head negative and TNK administered and she was transferred to [facility] for management.  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 MRI brain showed right thalamocapsular infarct with mild to moderate chronic microvascular changes.   Dr. Erlinda Hong felt that stroke was due to small vessel disease  and recommended DAPT X 3 weeks followed by Plavix alone. She qualified for Sleep Smart study and is tolerating CPAP use. She continues to be limited by mild left hemiparesis with sensory loss, balance deficits and weakness. She has noted pain in her LUE and LLE with certain activities. Reports residual sensory loss in RUE and RLE from prior CVA.  CIR was recommended due to functional decline.    Review of Systems  Constitutional:  Negative for chills and fever.  HENT:  Negative for hearing loss.   Eyes:  Negative for blurred vision.  Respiratory:  Negative for shortness of breath.   Cardiovascular:  Positive for leg swelling (prior to admission). Negative for chest pain and palpitations.  Gastrointestinal:  Negative for constipation, nausea and vomiting.  Genitourinary:  Negative for dysuria.  Musculoskeletal:  Positive for back pain and myalgias.  Neurological:  Positive for sensory change (numbness left face, throat, hemi body).  Psychiatric/Behavioral:  The  patient is not nervous/anxious.      Past Medical History:  Diagnosis Date   Anxiety    Bell's palsy 01/08/2011   Chronic back pain    Depression    Diabetes mellitus    GERD (gastroesophageal reflux disease)    Hepatomegaly    Hypertension    Melanoma ([place]) 2009   Dr Nevada Crane, Stage 2, required only surgery   NAFLD (nonalcoholic fatty liver disease)    Skin cancer    Stroke ([place]) 07/2021    Past Surgical History:  Procedure Laterality Date   CESAREAN SECTION     COLONOSCOPY  03/09/2011   Dr Oneida Alar diverticulosis, internal and external hemorrhoids   ESOPHAGOGASTRODUODENOSCOPY  03/09/2011   Esophageal stricture dilated to 16 MM savory, NSAID- induced gastritis and duodenitis   LEG SURGERY     plates/screws/hx fx-right leg   MELANOMA EXCISION     right knee   SAVORY DILATION  03/09/2011    Family History  Problem Relation Age of Onset   Heart disease Mother    Heart disease Maternal Grandmother    Heart disease Maternal Grandfather    COPD Father    Bladder Cancer Father    Anesthesia problems Neg Hx    Hypotension Neg Hx    Malignant hyperthermia Neg Hx    Pseudochol deficiency Neg Hx    Colon cancer Neg Hx     Social History: Widowed. 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  She reports that she quit smoking about 35 years ago. Her smoking use included cigarettes. She has a 5.50 pack-year smoking  history. She has never used smokeless tobacco. She drinks alcohol--glass of wine on special occassions.  She reports that she does not use drugs.   Allergies  Allergen Reactions   Codeine Nausea And Vomiting    Medications Prior to Admission  Medication Sig Dispense Refill   acetaminophen (TYLENOL) 325 MG tablet Take 650 mg by mouth every 6 (six) hours as needed for moderate pain or mild pain.     aspirin EC 81 MG EC tablet Take 1 tablet (81 mg total) by mouth daily. Swallow whole. 30 tablet 11   busPIRone (BUSPAR) 5 MG tablet Take 5 mg by  mouth 2 (two) times daily.  5   furosemide (LASIX) 20 MG tablet Take 20 mg by mouth every morning.     lisinopril (ZESTRIL) 20 MG tablet Take 20 mg by mouth daily.     MAGNESIUM PO Take 1 tablet by mouth at bedtime. Unsure of dose     meloxicam (MOBIC) 15 MG tablet Take 15 mg by mouth daily.     metFORMIN (GLUCOPHAGE-XR) 500 MG 24 hr tablet Take 1,000 mg by mouth 2 (two) times daily.      Multiple Vitamins-Minerals (CENTRUM SILVER 50+WOMEN) TABS Take 1 tablet by mouth daily.     naproxen sodium (ALEVE) 220 MG tablet Take 220 mg by mouth daily.     pantoprazole (PROTONIX) 40 MG tablet TAKE ONE TABLET BY MOUTH ONCE DAILY (Patient taking differently: Take 40 mg by mouth every morning.) 30 tablet 11   Potassium 99 MG TABS Take 99 mg by mouth daily.     simvastatin (ZOCOR) 40 MG tablet Take 1 tablet (40 mg total) by mouth every evening. 30 tablet 3   sitaGLIPtin (JANUVIA) 100 MG tablet Take 100 mg by mouth daily at 4 PM.     traMADol (ULTRAM) 50 MG tablet Take 50 mg by mouth every 4 (four) hours as needed for moderate pain.     traZODone (DESYREL) 100 MG tablet Take 100 mg by mouth at bedtime as needed for sleep.  5   clopidogrel (PLAVIX) 75 MG tablet Take 75 mg by mouth daily. (Patient not taking: Reported on 01/06/2022)        Home: Home Living Family/patient expects to be discharged to:: Inpatient rehab Living Arrangements: Alone Available Help at Discharge: Family Type of Home: House Home Access: Stairs to enter [employer] of Steps: 7 Entrance Stairs-Rails: Can reach both Home Layout: One level Bathroom Shower/Tub: Chiropodist: [place]: Grab bars - tub/shower, Conservation officer, nature (2 wheels), [employer] - single point  Lives With: Alone   Functional History: Prior Function Prior Level of Function : Independent/Modified Independent, Driving Mobility Comments: no AD ADLs Comments: independent  Functional Status:  Mobility: Bed Mobility Overal  bed mobility: Needs Assistance Bed Mobility: Supine to Sit Rolling: Min assist Sidelying to sit: Mod assist, +2 for safety/equipment, Min assist Supine to sit: Min assist Sit to supine: Mod assist General bed mobility comments: trunk support provided Transfers Overall transfer level: Needs assistance Equipment used: 2 person hand held assist Transfers: Sit to/from Stand Sit to Stand: Mod assist, +2 physical assistance, Min assist General transfer comment: maximal cues including hand over hand for LUE placement with transfers as well as positioning of LLE. with standing from the hospital bed, patient required the most support of Mod A +2 person. she required Min A +2 for standing from a chair with arms. Ambulation/Gait Ambulation/Gait assistance: Mod assist, +2 physical assistance Gait Distance (Feet):  10 Feet (x 2) Assistive device: 2 person hand held assist Gait Pattern/deviations: Ataxic, Decreased stride length, Decreased weight shift to right General Gait Details: patient with varying step length on LLE with cues required for safety and appropriate step length. faciliation for weight shifting to right with left lean more pronounced with fatigue. intermittent standing rest breaks required for short distance ambulation. consider chair follow for safety Gait velocity interpretation: <1.31 ft/sec, indicative of household ambulator Pre-gait activities: left knee buckling noted x 1 bout initially after standing. weight shifting performed to right and left to assess readiness for ambulation with no knee buckling further.    ADL: ADL Overall ADL's : Needs assistance/impaired Eating/Feeding: Minimal assistance Eating/Feeding Details (indicate cue type and reason): pt requires cueing to utilize L hand to hold cup, educated on guiding cup with R hand. Grooming: Minimal assistance, Sitting Grooming Details (indicate cue type and reason): able to wash face with R UE, wash hands with increased  time but would require assist with manging toothpaste Upper Body Dressing : Moderate assistance, Sitting Lower Body Dressing: Moderate assistance, +2 for physical assistance, +2 for safety/equipment, Sit to/from stand Lower Body Dressing Details (indicate cue type and reason): able to manage socks at EOB with min guard, mod asisst +2 in standing Toilet Transfer: Moderate assistance, +2 for physical assistance, +2 for safety/equipment, Ambulation Toilet Transfer Details (indicate cue type and reason): simulated side stepping towards [facility] Toileting- Clothing Manipulation and Hygiene: Total assistance, Sit to/from stand Functional mobility during ADLs: Moderate assistance, +2 for physical assistance, +2 for safety/equipment, Minimal assistance General ADL Comments: relies on BUE support in standing  Cognition: Cognition Overall Cognitive Status: Within Functional Limits for tasks assessed Arousal/Alertness: Awake/alert Orientation Level: Oriented X4 Year: 2023 Month: June Day of Week: Correct Attention: Focused, Sustained Focused Attention: Appears intact Sustained Attention: Appears intact Memory: Impaired Memory Impairment:  (Immediate: 5/5; delayed: 5/5; paragraph: 6/8) Awareness: Appears intact Problem Solving: Impaired Problem Solving Impairment: Verbal complex (Money: 1/3; time: 1/1) Executive Function: Sequencing, Technical brewer: Impaired Sequencing Impairment: Verbal complex (clock drawing: 2/4 with self-correction) Organizing: Impaired Organizing Impairment: Verbal complex (backward digit span: 1/2) Cognition Arousal/Alertness: Awake/alert Behavior During Therapy: WFL for tasks assessed/performed Overall Cognitive Status: Within Functional Limits for tasks assessed Area of Impairment: Following commands, Awareness, Problem solving, Attention, Safety/judgement Current Attention Level: Sustained Memory: Decreased short-term memory Following Commands: Follows one step  commands consistently, Follows one step commands with increased time, Follows multi-step commands inconsistently Safety/Judgement: Decreased awareness of safety Awareness: Emergent Problem Solving: Slow processing, Requires verbal cues, Difficulty sequencing, Requires tactile cues General Comments: easily distracted at times and required cues for attention to task   Blood pressure 128/73, pulse 90, temperature 98.7 F (37.1 C), temperature source Oral, resp. rate 17, weight 104.1 kg, SpO2 95 %.  General: Alert and oriented x 3, No apparent distress HEENT: Head is normocephalic, atraumatic, PERRLA, EOMI, sclera anicteric, oral mucosa pink and moist,  Neck: Supple without JVD or lymphadenopathy Heart: Reg rate and rhythm. No murmurs rubs or gallops Chest: CTA bilaterally without wheezes, rales, or rhonchi; no distress Abdomen: Soft, non-tender, non-distended, bowel sounds positive. Obese Extremities: No clubbing, cyanosis, or edema. Pulses are 2+ Psych: Pt's affect is appropriate. Pt is cooperative Skin: Clean and intact without signs of breakdown Neuro: Awake and alert ,follows 1 and 2-step commands without difficulty, did not note any dysarthria or aphasia, cranial nerves II through XII intact other than decreased sensation on left face, minimal left facial droop Decreased fine motor  movements in the left hand Strength 5 out of 5 in right lower extremity and right upper extremity Strength 4 out of 5 in left lower extremity Strength 4- out of 5 left shoulder abduction and elbow extension, 4 out of 5 elbow flexion and finger flexion Decreased sensation to light touch and cold throughout left hemibody Abnormal finger-nose left upper extremity Decreased sensation to light touch in distal right lower extremity-history of diabetic polyneuropathy Musculoskeletal: No joint swelling or tenderness noted, no abnormal tone noted, no range of motion deficits noted  Results for orders placed or  performed during the hospital encounter of 01/06/22 (from the past 48 hour(s))  Glucose, capillary     Status: Abnormal   Collection Time: 01/10/22  5:22 PM  Result Value Ref Range   Glucose-Capillary 145 (H) 70 - 99 mg/dL    Comment: Glucose reference range applies only to samples taken after fasting for at least 8 hours.   Comment 1 Notify RN    Comment 2 Document in Chart   Glucose, capillary     Status: Abnormal   Collection Time: 01/10/22 10:26 PM  Result Value Ref Range   Glucose-Capillary 194 (H) 70 - 99 mg/dL    Comment: Glucose reference range applies only to samples taken after fasting for at least 8 hours.   Comment 1 Notify RN    Comment 2 Document in Chart   CBC     Status: Abnormal   Collection Time: 01/11/22  3:07 AM  Result Value Ref Range   WBC 12.6 (H) 4.0 - 10.5 K/uL   RBC 4.61 3.87 - 5.11 MIL/uL   Hemoglobin 12.9 12.0 - 15.0 g/dL   HCT 38.7 36.0 - 46.0 %   MCV 83.9 80.0 - 100.0 fL   MCH 28.0 26.0 - 34.0 pg   MCHC 33.3 30.0 - 36.0 g/dL   RDW 13.7 11.5 - 15.5 %   Platelets 376 150 - 400 K/uL   nRBC 0.0 0.0 - 0.2 %    Comment: Performed at [facility], [place] [address] [place], [place] [address]  Basic metabolic panel     Status: Abnormal   Collection Time: 01/11/22  3:07 AM  Result Value Ref Range   Sodium 137 135 - 145 mmol/L   Potassium 4.1 3.5 - 5.1 mmol/L   Chloride 99 98 - 111 mmol/L   CO2 26 22 - 32 mmol/L   Glucose, Bld 214 (H) 70 - 99 mg/dL    Comment: Glucose reference range applies only to samples taken after fasting for at least 8 hours.   BUN 20 8 - 23 mg/dL   Creatinine, Ser 1.17 (H) 0.44 - 1.00 mg/dL   Calcium 9.4 8.9 - 10.3 mg/dL   GFR, Estimated 52 (L) >60 mL/min    Comment: (NOTE) Calculated using the CKD-EPI Creatinine Equation (2021)    Anion gap 12 5 - 15    Comment: Performed at [facility] [address], [place], [place] [address]  Glucose, capillary     Status: Abnormal   Collection Time: 01/11/22  5:50 AM   Result Value Ref Range   Glucose-Capillary 227 (H) 70 - 99 mg/dL    Comment: Glucose reference range applies only to samples taken after fasting for at least 8 hours.   Comment 1 Notify RN    Comment 2 Document in Chart   Glucose, capillary     Status: Abnormal   Collection Time: 01/11/22  9:05 AM  Result  Value Ref Range   Glucose-Capillary 241 (H) 70 - 99 mg/dL    Comment: Glucose reference range applies only to samples taken after fasting for at least 8 hours.  Glucose, capillary     Status: Abnormal   Collection Time: 01/11/22 12:34 PM  Result Value Ref Range   Glucose-Capillary 181 (H) 70 - 99 mg/dL    Comment: Glucose reference range applies only to samples taken after fasting for at least 8 hours.  Glucose, capillary     Status: Abnormal   Collection Time: 01/11/22  4:45 PM  Result Value Ref Range   Glucose-Capillary 199 (H) 70 - 99 mg/dL    Comment: Glucose reference range applies only to samples taken after fasting for at least 8 hours.  Glucose, capillary     Status: Abnormal   Collection Time: 01/11/22  9:55 PM  Result Value Ref Range   Glucose-Capillary 225 (H) 70 - 99 mg/dL    Comment: Glucose reference range applies only to samples taken after fasting for at least 8 hours.  CBC     Status: Abnormal   Collection Time: 01/12/22  3:43 AM  Result Value Ref Range   WBC 10.7 (H) 4.0 - 10.5 K/uL   RBC 4.37 3.87 - 5.11 MIL/uL   Hemoglobin 12.2 12.0 - 15.0 g/dL   HCT 36.9 36.0 - 46.0 %   MCV 84.4 80.0 - 100.0 fL   MCH 27.9 26.0 - 34.0 pg   MCHC 33.1 30.0 - 36.0 g/dL   RDW 13.6 11.5 - 15.5 %   Platelets 332 150 - 400 K/uL   nRBC 0.0 0.0 - 0.2 %    Comment: Performed at [facility], [place] [address], [place], [place] [address]  Basic metabolic panel     Status: Abnormal   Collection Time: 01/12/22  3:43 AM  Result Value Ref Range   Sodium 135 135 - 145 mmol/L   Potassium 4.0 3.5 - 5.1 mmol/L   Chloride 102 98 - 111 mmol/L   CO2 22 22 - 32 mmol/L   Glucose, Bld  208 (H) 70 - 99 mg/dL    Comment: Glucose reference range applies only to samples taken after fasting for at least 8 hours.   BUN 23 8 - 23 mg/dL   Creatinine, Ser 1.27 (H) 0.44 - 1.00 mg/dL   Calcium 9.0 8.9 - 10.3 mg/dL   GFR, Estimated 47 (L) >60 mL/min    Comment: (NOTE) Calculated using the CKD-EPI Creatinine Equation (2021)    Anion gap 11 5 - 15    Comment: Performed at [facility] [address] [place], [place] [address]  Glucose, capillary     Status: Abnormal   Collection Time: 01/12/22  6:24 AM  Result Value Ref Range   Glucose-Capillary 190 (H) 70 - 99 mg/dL    Comment: Glucose reference range applies only to samples taken after fasting for at least 8 hours.  Glucose, capillary     Status: Abnormal   Collection Time: 01/12/22 11:25 AM  Result Value Ref Range   Glucose-Capillary 269 (H) 70 - 99 mg/dL    Comment: Glucose reference range applies only to samples taken after fasting for at least 8 hours.   No results found.    Blood pressure 128/73, pulse 90, temperature 98.7 F (37.1 C), temperature source Oral, resp. rate 17, weight 104.1 kg, SpO2 95 %.  Medical Problem List and Plan: 1. Functional deficits secondary to  right thalamocapsular infarct  with mild to moderate chronic microvascular changes.   -patient may shower  -ELOS/Goals: 10-12 days  -Admit to CIR 2.  Antithrombotics: -DVT/anticoagulation:  Pharmaceutical: Lovenox  -antiplatelet therapy: DAPT X 3 weeks followed by plavix alone.  3. Pain Management: tylenol prn.  4. Mood/Sleep: LCSW to follow for evaluation and support.   -antipsychotic agents: N/A 5. Neuropsych/cognition: This patient is capable of making decisions on her own behalf. 6. Skin/Wound Care: Routine pressure relief measures.  7. Fluids/Electrolytes/Nutrition: Monitor I/O. Check CMET in am.  8. T2DM: Hgb A1c- 7.1. Was on Januvia 100/Metform 1000 mg BID PTA.  --Monitor BS ac/hs and use SSI for elevated BS.   --CBG 172-269,  follow trend, consider additional medication 9. HTN: Monitor BP TID--continue Lisinopril and Lasix  -BP goal <180/105  -BP has been under good control overall 10. Non-alcoholic fatty liver disease: Stable. Avoid hepatotoxic medications.  --LFTs WNL. 11. H/o Melanoma/Persistent Mediastinal adenopathy: Per records review--was evaluated by Dr. Delton Coombes with repeat CT chest recommended but no follow up noted in records.  12. Lumbar stenosis with RLE radiculopathy: ESI by Dr. Ernestina Patches effective in the past.  --now with discomfort due to lack of activity.  --monitor for now. Tramadol prn added.   13. Sleep smart study/OSA: Has been compliant with CPAP use.  14. Low grade fever/Leucocytosis:   Resolving. Monitor for signs of infection. 15. AKI: SCr up likely due to addition of ACE/Better BP control -Encourage oral fluids 16. Left parotid nodule: Follow up with ENT after discharge.  -- 17. 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  --Will start low dose gabapentin.      I have personally performed a face to face diagnostic evaluation of this patient and formulated the key components of the plan.  Additionally, I have personally reviewed laboratory data, imaging studies, as well as relevant notes and concur with the physician assistant's documentation above.  The patient's status has not changed from the original H&P.  Any changes in documentation from the acute care chart have been noted above.  Jennye Boroughs, MD, [facility]   Bary Leriche, PA-C 01/12/2022

## 2022-01-13 ENCOUNTER — Encounter (HOSPITAL_COMMUNITY): Payer: Self-pay | Admitting: Neurology

## 2022-01-13 DIAGNOSIS — I63331 Cerebral infarction due to thrombosis of right posterior cerebral artery: Secondary | ICD-10-CM | POA: Diagnosis not present

## 2022-01-13 LAB — GLUCOSE, CAPILLARY
Glucose-Capillary: 217 mg/dL — ABNORMAL HIGH (ref 70–99)
Glucose-Capillary: 190 mg/dL — ABNORMAL HIGH (ref 70–99)
Glucose-Capillary: 195 mg/dL — ABNORMAL HIGH (ref 70–99)

## 2022-01-13 NOTE — Progress Notes (Signed)
STROKE TEAM PROGRESS NOTE   SUBJECTIVE (INTERVAL HISTORY) Patient is awake and alert sitting up in bed in NAD.  Neurological exam remains unchanged with mild left hemiparesis and sensory loss.     No new neurological events overnight. No voiced complaints.  Patient did tolerate the CPAP mask last night and qualified for participation in the sleep smart study and was randomized to the CPAP treatment arm. OBJECTIVE Temp:  [98.2 F (36.8 C)-99.9 F (37.7 C)] 98.2 F (36.8 C) (06/20 1137) Pulse Rate:  [74-97] 84 (06/20 1137) Cardiac Rhythm: Normal sinus rhythm;Bundle branch block (06/20 0700) Resp:  [18-20] 18 (06/20 1137) BP: (106-145)/(57-74) 145/74 (06/20 1137) SpO2:  [94 %-98 %] 95 % (06/20 1137)  Recent Labs  Lab 01/12/22 1125 01/12/22 1539 01/12/22 2142 01/13/22 0624 01/13/22 1202  GLUCAP 269* 180* 211* 217* 190*   Recent Labs  Lab 01/08/22 0904 01/09/22 0226 01/10/22 0248 01/11/22 0307 01/12/22 0343  NA 142 140 136 137 135  K 3.9 4.3 3.7 4.1 4.0  CL 103 101 100 99 102  CO2 '26 27 25 26 22  '$ GLUCOSE 215* 173* 170* 214* 208*  BUN '14 19 17 20 23  '$ CREATININE 0.95 1.04* 1.05* 1.17* 1.27*  CALCIUM 9.6 9.5 9.4 9.4 9.0   Recent Labs  Lab 01/06/22 1821  AST 36  ALT 38  ALKPHOS 61  BILITOT 0.7  PROT 7.7  ALBUMIN 3.8   Recent Labs  Lab 01/06/22 1821 01/06/22 1831 01/08/22 0904 01/09/22 0226 01/10/22 0248 01/11/22 0307 01/12/22 0343  WBC 8.3  --  8.0 10.5 9.4 12.6* 10.7*  NEUTROABS 5.1  --   --   --   --   --   --   HGB 13.1   < > 12.7 12.3 13.1 12.9 12.2  HCT 39.7   < > 39.2 38.5 40.4 38.7 36.9  MCV 83.9  --  84.7 84.8 84.7 83.9 84.4  PLT 389  --  345 362 362 376 332   < > = values in this interval not displayed.   No results for input(s): "CKTOTAL", "CKMB", "CKMBINDEX", "TROPONINI" in the last 168 hours. No results for input(s): "LABPROT", "INR" in the last 72 hours.  No results for input(s): "COLORURINE", "LABSPEC", "PHURINE", "GLUCOSEU", "HGBUR",  "BILIRUBINUR", "KETONESUR", "PROTEINUR", "UROBILINOGEN", "NITRITE", "LEUKOCYTESUR" in the last 72 hours.  Invalid input(s): "APPERANCEUR"      Component Value Date/Time   CHOL 156 01/07/2022 0418   TRIG 107 01/07/2022 0418   HDL 53 01/07/2022 0418   CHOLHDL 2.9 01/07/2022 0418   VLDL 21 01/07/2022 0418   LDLCALC 82 01/07/2022 0418   Lab Results  Component Value Date   HGBA1C 7.1 (H) 01/07/2022      Component Value Date/Time   LABOPIA NONE DETECTED 01/06/2022 1927   COCAINSCRNUR NONE DETECTED 01/06/2022 1927   LABBENZ NONE DETECTED 01/06/2022 1927   AMPHETMU NONE DETECTED 01/06/2022 1927   THCU NONE DETECTED 01/06/2022 1927   LABBARB NONE DETECTED 01/06/2022 1927    Recent Labs  Lab 01/06/22 1821  ETH <10    I have personally reviewed the radiological images below and agree with the radiology interpretations.  CT ANGIO HEAD NECK W WO CM  Result Date: 01/08/2022 CLINICAL DATA:  Follow-up examination for acute stroke. EXAM: CT ANGIOGRAPHY HEAD AND NECK TECHNIQUE: Multidetector CT imaging of the head and neck was performed using the standard protocol during bolus administration of intravenous contrast. Multiplanar CT image reconstructions and MIPs were obtained to evaluate the vascular anatomy.  Carotid stenosis measurements (when applicable) are obtained utilizing NASCET criteria, using the distal internal carotid diameter as the denominator. RADIATION DOSE REDUCTION: This exam was performed according to the departmental dose-optimization program which includes automated exposure control, adjustment of the mA and/or kV according to patient size and/or use of iterative reconstruction technique. CONTRAST:  54m OMNIPAQUE IOHEXOL 350 MG/ML SOLN COMPARISON:  Prior MRI from 01/06/2022 as well as prior CTA from 04/10/2021 and chest CT from 06/06/2018. FINDINGS: CT HEAD FINDINGS Brain: There has been continued interval evolution of previously identified acute infarct involving the right  thalamic capsular region, stable in size as compared to previous MRI. No associated mass effect or evidence for hemorrhagic transformation. No other acute intracranial hemorrhage or large vessel territory infarct. Underlying atrophy with mild chronic small vessel ischemic disease noted. Small remote left thalamic lacunar infarct noted. No mass lesion or midline shift. No hydrocephalus or extra-axial fluid collection. Vascular: No hyperdense vessel. Scattered vascular calcifications noted within the carotid siphons. Skull: Scalp soft tissues and calvarium within normal limits. Sinuses: Paranasal sinuses are largely clear.  No mastoid effusion. Orbits: Globes orbital soft tissues demonstrate no acute finding. Review of the MIP images confirms the above findings CTA NECK FINDINGS Aortic arch: Visualized aortic arch normal in caliber with standard 3 vessel branching pattern. Mild plaque within the arch itself. No stenosis about the origin of the great vessels. Right carotid system: Right common and internal carotid arteries patent without stenosis or dissection. Mild for age plaque about the right carotid bulb without stenosis. Left carotid system: Left common and internal carotid arteries patent without stenosis or dissection. Mild for age plaque about the left carotid bulb without significant stenosis. Vertebral arteries: Both vertebral arteries arise from the subclavian arteries. No proximal subclavian artery stenosis. Both vertebral arteries widely patent without stenosis, dissection or occlusion. Skeleton: No discrete or worrisome osseous lesions. Segmental fusion of the C3 and C4 vertebral bodies noted. Moderate spondylosis noted at C5-6 and C6-7. Other neck: No other acute soft tissue abnormality within the neck. 8 mm soft tissue nodule present at the superior aspect left parotid gland, indeterminate. Few additional scattered subcentimeter nodular densities about the bilateral parotid glands favored to reflect  intraparotid lymph nodes. 9 mm left thyroid nodule, of doubtful significance given size and patient age, no follow-up imaging recommended (ref: J Am Coll Radiol. 2015 Feb;12(2): 143-50). Upper chest: Visualized upper chest demonstrates no acute finding. Irregular soft tissue density partially visualized at the level of the AP window (series 5, image 161), likely adenopathy as seen on prior studies. This appears somewhat increased in size measuring up to 2.5 cm (previously 1.9 cm on prior chest CT from 06/06/2018. Review of the MIP images confirms the above findings CTA HEAD FINDINGS Anterior circulation: Petrous segments patent bilaterally. Atheromatous change within the carotid siphons without hemodynamically significant stenosis. A1 segments, anterior communicating artery complex common anterior cerebral arteries patent without stenosis. No M1 stenosis or occlusion. No proximal MCA branch occlusion. Distal left MCA branches widely patent. There is a focal severe proximal right M3 stenosis (series 13, image 76). Right MCA branches otherwise patent and well perfused. Posterior circulation: Both V4 segments patent without significant stenosis. Left PICA patent. Right PICA origin not well seen. Basilar patent to its distal aspect without stenosis superior cerebellar arteries patent proximally. Both PCAs primarily supplied via the basilar. Focal moderate proximal left P2 stenosis (series 12, image 117). Additional severe distal left P3 stenoses (series 12, image 137). Left PCA attenuated but  patent distally. On the right, there is a focal severe right P2 stenosis (series 12, image 115) right PCA mildly irregular but otherwise patent to its distal aspect. Venous sinuses: Patent allowing for timing the contrast bolus. Anatomic variants: None significant.  No aneurysm. Review of the MIP images confirms the above findings IMPRESSION: CT HEAD IMPRESSION: 1. Normal expected interval evolution of acute right thalamic infarct,  stable in size relative to previous MRI. No evidence for hemorrhagic transformation or other complication. 2. No other new acute intracranial abnormality. 3. Underlying atrophy with chronic small vessel ischemic disease. CTA HEAD AND NECK: 1. Negative CTA for large vessel occlusion. 2. Intracranial atherosclerotic disease with multifocal moderate to severe right M3, bilateral P2, and distal left P3 stenoses as above. No proximal high-grade or correctable stenosis. Overall, appearance is similar as compared to prior CTA from 04/10/2021. 3. Wide patency of the major arterial vasculature within the neck. 4. Mediastinal adenopathy, partially visualized and incompletely assessed on this exam. Further evaluation with dedicated cross-sectional imaging of the chest suggested for complete evaluation. 5. 8 mm nodular lesion within the superior left parotid gland, indeterminate. While this may reflect an intraparotid lymph node, a possible small primary salivary neoplasm could also have this appearance. Nonemergent outpatient ENT referral for further workup suggested. Electronically Signed   By: Jeannine Boga M.D.   On: 01/08/2022 05:24   ECHOCARDIOGRAM COMPLETE  Result Date: 01/07/2022    ECHOCARDIOGRAM REPORT   Patient Name:   Shelby Morgan Date of Exam: 01/07/2022 Medical Rec #:  [id number]      Height:       62.0 in Accession #:    [id number]     Weight:       230.6 lb Date of Birth:  01-27-1956      BSA:          2.031 m Patient Age:    79 years       BP:           161/93 mmHg Patient Gender: F              HR:           82 bpm. Exam Location:  Inpatient Procedure: 2D Echo, Cardiac Doppler and Color Doppler Indications:    Stroke  History:        Patient has prior history of Echocardiogram examinations, most                 recent 04/11/2021. Risk Factors:Hypertension, Diabetes and HLD.  Sonographer:    Joette Catching RCS Referring Phys: [place]  1. Left ventricular ejection fraction, by  estimation, is 60 to 65%. The left ventricle has normal function. The left ventricle has no regional wall motion abnormalities. There is mild left ventricular hypertrophy. Left ventricular diastolic parameters are indeterminate.  2. Right ventricular systolic function is normal. The right ventricular size is normal.  3. Trivial mitral valve regurgitation.  4. The aortic valve is tricuspid. Aortic valve regurgitation is not visualized. Aortic valve sclerosis is present, with no evidence of aortic valve stenosis.  5. The inferior vena cava is normal in size with greater than 50% respiratory variability, suggesting right atrial pressure of 3 mmHg. Comparison(s): The left ventricular function is unchanged. FINDINGS  Left Ventricle: Left ventricular ejection fraction, by estimation, is 60 to 65%. The left ventricle has normal function. The left ventricle has no regional wall motion abnormalities. The left ventricular internal cavity size was normal in  size. There is  mild left ventricular hypertrophy. Left ventricular diastolic parameters are indeterminate. Right Ventricle: The right ventricular size is normal. Right vetricular wall thickness was not assessed. Right ventricular systolic function is normal. Left Atrium: Left atrial size was normal in size. Right Atrium: Right atrial size was normal in size. Pericardium: There is no evidence of pericardial effusion. Mitral Valve: There is mild thickening of the mitral valve leaflet(s). There is mild calcification of the mitral valve leaflet(s). Trivial mitral valve regurgitation. Tricuspid Valve: The tricuspid valve is normal in structure. Tricuspid valve regurgitation is trivial. Aortic Valve: The aortic valve is tricuspid. Aortic valve regurgitation is not visualized. Aortic valve sclerosis is present, with no evidence of aortic valve stenosis. Aortic valve mean gradient measures 4.0 mmHg. Aortic valve peak gradient measures 6.6  mmHg. Aortic valve area, by VTI measures  2.65 cm. Pulmonic Valve: The pulmonic valve was normal in structure. Pulmonic valve regurgitation is not visualized. Aorta: The aortic root and ascending aorta are structurally normal, with no evidence of dilitation. Venous: The inferior vena cava is normal in size with greater than 50% respiratory variability, suggesting right atrial pressure of 3 mmHg. IAS/Shunts: No atrial level shunt detected by color flow Doppler.  LEFT VENTRICLE PLAX 2D LVIDd:         3.80 cm   Diastology LVIDs:         2.50 cm   LV e' medial:    6.96 cm/s LV PW:         0.70 cm   LV E/e' medial:  10.4 LV IVS:        1.30 cm   LV e' lateral:   9.25 cm/s LVOT diam:     2.00 cm   LV E/e' lateral: 7.8 LV SV:         70 LV SV Index:   34 LVOT Area:     3.14 cm  RIGHT VENTRICLE             IVC RV Basal diam:  2.30 cm     IVC diam: 1.40 cm RV Mid diam:    2.50 cm RV S prime:     18.70 cm/s TAPSE (M-mode): 2.4 cm LEFT ATRIUM             Index        RIGHT ATRIUM           Index LA diam:        3.00 cm 1.48 cm/m   RA Area:     11.40 cm LA Vol (A2C):   33.4 ml 16.45 ml/m  RA Volume:   19.60 ml  9.65 ml/m LA Vol (A4C):   39.3 ml 19.35 ml/m LA Biplane Vol: 36.5 ml 17.97 ml/m  AORTIC VALVE                    PULMONIC VALVE AV Area (Vmax):    2.80 cm     PV Vmax:       0.90 m/s AV Area (Vmean):   2.43 cm     PV Peak grad:  3.2 mmHg AV Area (VTI):     2.65 cm AV Vmax:           128.00 cm/s AV Vmean:          93.800 cm/s AV VTI:            0.264 m AV Peak Grad:      6.6 mmHg AV Mean Grad:  4.0 mmHg LVOT Vmax:         114.00 cm/s LVOT Vmean:        72.600 cm/s LVOT VTI:          0.223 m LVOT/AV VTI ratio: 0.84  AORTA Ao Root diam: 3.40 cm Ao Asc diam:  3.80 cm MITRAL VALVE                TRICUSPID VALVE MV Area (PHT): 7.22 cm     TR Peak grad:   26.0 mmHg MV Decel Time: 105 msec     TR Vmax:        255.00 cm/s MV E velocity: 72.40 cm/s MV A velocity: 114.00 cm/s  SHUNTS MV E/A ratio:  0.64         Systemic VTI:  0.22 m                              Systemic Diam: 2.00 cm Dorris Carnes MD Electronically signed by Dorris Carnes MD Signature Date/Time: 01/07/2022/11:57:51 AM    Final    MR BRAIN WO CONTRAST  Result Date: 01/07/2022 CLINICAL DATA:  Follow-up examination for acute stroke. EXAM: MRI HEAD WITHOUT CONTRAST TECHNIQUE: Multiplanar, multiecho pulse sequences of the brain and surrounding structures were obtained without intravenous contrast. COMPARISON:  Prior CTs from earlier the same day. FINDINGS: Brain: Cerebral volume within normal limits. Scattered patchy T2/FLAIR hyperintensity involving the periventricular and deep white matter both cerebral hemispheres, most consistent with chronic small vessel ischemic disease, mild to moderate in nature. 1.2 cm acute ischemic nonhemorrhagic infarcts seen involving the right thalamocapsular region (series 5, image 74). No significant mass effect. No other evidence for acute or subacute ischemia. Gray-white matter differentiation otherwise maintained. No areas of chronic cortical infarction. No acute or chronic intracranial blood products. No mass lesion, mass effect or midline shift. No hydrocephalus or extra-axial fluid collection. Pituitary gland suprasellar region within normal limits. Midline structures intact and normally formed. Vascular: Major intracranial vascular flow voids are maintained. Skull and upper cervical spine: Craniocervical junction within normal limits. Bone marrow signal intensity normal. Partial segmental fusion of the C3 and C4 vertebral bodies noted. No scalp soft tissue abnormality. Sinuses/Orbits: Prior bilateral ocular lens replacement. Paranasal sinuses are largely clear. No mastoid effusion. Other: None. IMPRESSION: 1. 1.2 cm acute ischemic nonhemorrhagic right thalamocapsular infarct. 2. Underlying mild to moderate chronic microvascular ischemic disease. Electronically Signed   By: Jeannine Boga M.D.   On: 01/07/2022 00:20   CT HEAD CODE STROKE WO CONTRAST  Result Date:  01/06/2022 CLINICAL DATA:  Neuro deficit, acute, stroke suspected EXAM: CT HEAD WITHOUT CONTRAST TECHNIQUE: Contiguous axial images were obtained from the base of the skull through the vertex without intravenous contrast. RADIATION DOSE REDUCTION: This exam was performed according to the departmental dose-optimization program which includes automated exposure control, adjustment of the mA and/or kV according to patient size and/or use of iterative reconstruction technique. COMPARISON:  04/10/2021 FINDINGS: Brain: There is no acute intracranial hemorrhage, mass effect, or edema. No new loss of gray-white differentiation. Ventricles and sulci are stable in size and configuration. Patchy hypoattenuation in the supratentorial white matter is nonspecific but may reflect similar mild chronic microvascular ischemic changes. There is no extra-axial collection. Vascular: No hyperdense vessel. Skull: Unremarkable. Sinuses/Orbits: No acute finding. Other: Mastoid air cells are clear. ASPECTS [facility] Stroke Program Early CT Score) - Ganglionic level infarction (caudate, lentiform nuclei, internal capsule, insula, M1-M3 cortex):  7 - Supraganglionic infarction (M4-M6 cortex): 3 Total score (0-10 with 10 being normal): 10 IMPRESSION: There is no acute intracranial hemorrhage or evidence of acute infarction. Mild chronic microvascular ischemic changes. Electronically Signed   By: Macy Mis M.D.   On: 01/06/2022 18:10     PHYSICAL EXAM  Temp:  [98.2 F (36.8 C)-99.9 F (37.7 C)] 98.2 F (36.8 C) (06/20 1137) Pulse Rate:  [74-97] 84 (06/20 1137) Resp:  [18-20] 18 (06/20 1137) BP: (106-145)/(57-74) 145/74 (06/20 1137) SpO2:  [94 %-98 %] 95 % (06/20 1137)  General - Well nourished, well developed, in no apparent distress.  Ophthalmologic - fundi not visualized due to noncooperation.  Cardiovascular - Regular rhythm and rate.  Neuro - awake and alert sitting up in bed in NAD. She is alert and oriented x4, mild  dysarthria, mild left facial droop. Tracks, EOMI, visual fields full to finger count. Left arm and left lower 4/5, with no drift decreased  fine motor skills on left hand right upper and lower 5/5. Decreased sensation on left face, arm and lower. gait not tested.   NIHSS 2 premorbid modified Rankin score 0  ASSESSMENT/PLAN Ms. Shelby Morgan is a 66 y.o. female with history of hypertension, hyperlipidemia, diabetes, obesity, Bell's palsy, and stroke in 03/2021 admitted for left-sided numbness and weakness.  Status post TNK.  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 Ongoing aggressive stroke risk factor management Therapy recommendations: CIR. Awaiting insurance approval Disposition: Pending  History of stroke 03/2021 MRI showed left thalamic infarct.  CTA head and neck right PCA occlusion, left P2 moderate stenosis, left P3 occlusion.  LDL 52, A1c 6.4.  Discharged on DAPT for 3 weeks and then aspirin alone.  Also discharged on Zocor 40.  Diabetes HgbA1c 7.1 goal < 7.0 Uncontrolled CBG monitoring SSI DM education and close PCP follow up Dietitian consult for DM diet education  Hypertension Stable BP goal less than 180/105  On home lasix 20 Long term BP goal normotensive  Hyperlipidemia Home meds: Zocor 40 LDL 82, goal < 70 Now on Crestor 20 Continue statin at discharge  Other Stroke Risk Factors Advanced age Obesity, Body mass index is 41.98 kg/m.   Other Active Problems History of Bell's  palsy Melanoma in 2009 Leukcytosis - WBC 12.6, will monitor - afebrile   Hospital day # 7   No family at bedside.  Patient is showing slow improvement.  Continue current management.  Pending insurance approval for CIR. she was randomized to the CPAP treatment arm of the sleep smart study.  She is medically stable to be transferred to inpatient rehab when bed becomes available.  Greater than 50% time during this 35-minute visit was spent in counseling and coordination of care and discussion with patient and care team and answering questions.  Antony Contras, MD  Stroke Neurology 01/13/2022 1:52 PM     To contact Stroke Continuity provider, please refer to [email or web address] After hours, contact General Neurology

## 2022-01-13 NOTE — Progress Notes (Signed)
Speech Language Pathology Treatment: Cognitive-Linquistic  Patient Details Name: Shelby Morgan MRN: [id number] DOB: September 02, 1955 Today's Date: 01/13/2022 Time: 1030-1050 SLP Time Calculation (min) (ACUTE ONLY): 20 min  Assessment / Plan / Recommendation Clinical Impression  Patient seen by SLP for skilled treatment session focused on cognitive function goals. Patient was sitting in recliner and agreeable to therapy session. She reported she has not noticed much change of her left sided weakness of arm or face but otherwise she has noticed improvements since CVA. During session, inpatient rehab coordinator entered room and informed patient that they have insurance approval now but waiting on bed availability. Patient demonstrated good recall of recent medical and therapeutic interventions and able to tell SLP changes made in her medications. She was able to manage pill box holding it in her weak hand (left) and placing small beads to simulate pills. Other than mild cognitive processing delay, patient was accurate and demonstrated good problem solving and awareness. When SLP asking patient of her overall progress, she did report that she occasionally has word-finding errors and continued decreased sensation on left side of face and mouth, she has to be careful with chewing food. SLP is not recommending further acute level intervention but for evaluation and treatment as indicated at next venue of care ([facility]).    HPI HPI: Pt is a 66 y/o female who presented to the ED with 6/13 with left-sided numbness and weakness. TNK given. MRI 6/13: 1.2 cm acute ischemic nonhemorrhagic right thalamocapsular infarct. PMHx Bells palsy, HLD, DM, HTN, melanoma, Left thalamic stroke 2022, NAFLD      SLP Plan  All goals met;Discharge SLP treatment due to (comment)      Recommendations for follow up therapy are one component of a multi-disciplinary discharge planning process, led by the attending physician.  Recommendations  may be updated based on patient status, additional functional criteria and insurance authorization.    Recommendations                   Follow Up Recommendations: Acute inpatient rehab (3hours/day) Assistance recommended at discharge: Intermittent Supervision/Assistance SLP Visit Diagnosis: Cognitive communication deficit (R41.841) Plan: All goals met;Discharge SLP treatment due to (comment)          Sonia Baller, MA, CCC-SLP Speech Therapy

## 2022-01-13 NOTE — Progress Notes (Signed)
Patient is using her CPAP from home with room air and nasal mask.

## 2022-01-13 NOTE — Evaluation (Signed)
Physical Therapy Evaluation Patient Details Name: Shelby Morgan MRN: [id number] DOB: 02-Jan-1956 Today's Date: 01/13/2022  History of Present Illness  pt is a 66 y/o female admitted 6/13 with Left sided numbness and weakness.  MRI shows a 1.2 cm acute right thalamocapsular infarct--non hemorrhagic.Marland Kitchen  PMHx Bells palsy, HLD, DM, HTN, melanoma, Left thalamic stroke 2022, NAFLD   Clinical Impression  Pt progressing towards physical therapy goals. Was able to perform transfers and short distance ambulation with mod-max assist and RW for support. Chair follow utilized as pt fatigued quickly with gait training and required seated rest breaks. Recommend pt try shoes for gait next session as reported bone spur in heel appears to limit tolerance for ambulation. Will continue to follow.        Recommendations for follow up therapy are one component of a multi-disciplinary discharge planning process, led by the attending physician.  Recommendations may be updated based on patient status, additional functional criteria and insurance authorization.  Follow Up Recommendations Acute inpatient rehab (3hours/day)    Assistance Recommended at Discharge Frequent or constant Supervision/Assistance  Patient can return home with the following  A little help with bathing/dressing/bathroom;Assistance with cooking/housework;Direct supervision/assist for medications management;Direct supervision/assist for financial management;Assist for transportation;Help with stairs or ramp for entrance;A lot of help with walking and/or transfers    Equipment Recommendations Other (comment)  Recommendations for Other Services  Rehab consult    Functional Status Assessment       Precautions / Restrictions Precautions Precautions: Fall Precaution Comments: BP parameters 180/100 Restrictions Weight Bearing Restrictions: No      Mobility  Bed Mobility               General bed mobility comments: Pt was received  sitting up in the recliner.    Transfers Overall transfer level: Needs assistance Equipment used: Rolling walker (2 wheels) Transfers: Sit to/from Stand Sit to Stand: Mod assist           General transfer comment: VC's for hand placement on seated surface for safety. Heavy mod assist initially, progressing to lighter mod assist with repetition of sit<>stand    Ambulation/Gait Ambulation/Gait assistance: Mod assist, Max assist, +2 safety/equipment Gait Distance (Feet): 15 Feet Assistive device: Rolling walker (2 wheels) Gait Pattern/deviations: Step-to pattern, Step-through pattern, Decreased step length - right, Decreased stride length, Knees buckling Gait velocity: Decreased Gait velocity interpretation: <1.31 ft/sec, indicative of household ambulator   General Gait Details: Pt was able to advance LE's with increased control this session, however assist provided for LUE to maintain positioning on the RW. Assist also provided for L lateral lean in standing, bracing pt's elbow for support. Close chair follow required.  Stairs            Wheelchair Mobility    Modified Rankin (Stroke Patients Only) Modified Rankin (Stroke Patients Only) Pre-Morbid Rankin Score: Slight disability Modified Rankin: Moderately severe disability     Balance Overall balance assessment: Needs assistance Sitting-balance support: No upper extremity supported, Feet supported Sitting balance-Leahy Scale: Fair Sitting balance - Comments: in lieu of donning socks, worked on symmetrical and asymetrical scooting.   Standing balance support: Bilateral upper extremity supported, During functional activity Standing balance-Leahy Scale: Poor Standing balance comment: reliant on external support and or AD                             Pertinent Vitals/Pain Pain Assessment Pain Assessment: Faces Faces Pain Scale: Hurts little  more Pain Location: R heel Pain Descriptors / Indicators:  Discomfort, Sharp Pain Intervention(s): Limited activity within patient's tolerance, Monitored during session, Repositioned    Home Living                          Prior Function                       Hand Dominance        Extremity/Trunk Assessment                Communication      Cognition Arousal/Alertness: Awake/alert Behavior During Therapy: WFL for tasks assessed/performed Overall Cognitive Status: Within Functional Limits for tasks assessed Area of Impairment: Following commands, Awareness, Problem solving, Attention, Safety/judgement                   Current Attention Level: Sustained Memory: Decreased short-term memory Following Commands: Follows multi-step commands with increased time Safety/Judgement: Decreased awareness of safety Awareness: Emergent Problem Solving: Slow processing, Requires verbal cues, Difficulty sequencing, Requires tactile cues General Comments: easily distracted at times and required cues for attention to task        General Comments      Exercises Other Exercises Other Exercises: x5 sit<>stand at end of session   Assessment/Plan    PT Assessment    PT Problem List         PT Treatment Interventions      PT Goals (Current goals can be found in the Care Plan section)  Acute Rehab PT Goals Patient Stated Goal: to regain independence PT Goal Formulation: With patient Time For Goal Achievement: 01/21/22 Potential to Achieve Goals: Good    Frequency Min 4X/week     Co-evaluation               AM-PAC PT "6 Clicks" Mobility  Outcome Measure Help needed turning from your back to your side while in a flat bed without using bedrails?: A Lot Help needed moving from lying on your back to sitting on the side of a flat bed without using bedrails?: A Lot Help needed moving to and from a bed to a chair (including a wheelchair)?: A Lot Help needed standing up from a chair using your arms (e.g.,  wheelchair or bedside chair)?: A Lot Help needed to walk in hospital room?: Total Help needed climbing 3-5 steps with a railing? : Total 6 Click Score: 10    End of Session Equipment Utilized During Treatment: Gait belt Activity Tolerance: Patient tolerated treatment well Patient left: in chair;with call bell/phone within reach;with chair alarm set Nurse Communication: Mobility status PT Visit Diagnosis: Other abnormalities of gait and mobility (R26.89);Hemiplegia and hemiparesis;Other symptoms and signs involving the nervous system (R29.898) Hemiplegia - Right/Left: Left Hemiplegia - dominant/non-dominant: Non-dominant Hemiplegia - caused by: Cerebral infarction    Time: 1130-1157 PT Time Calculation (min) (ACUTE ONLY): 27 min   Charges:     PT Treatments $Gait Training: 8-22 mins $Therapeutic Activity: 8-22 mins        Rolinda Roan, PT, DPT Acute Rehabilitation Services Secure Chat Preferred Office: [phone]   Shelby Morgan 01/13/2022, 12:23 PM

## 2022-01-13 NOTE — Progress Notes (Signed)
CPAP removed at 0730 and patient set up for breakfast

## 2022-01-13 NOTE — Progress Notes (Signed)
Inpatient Rehab Admissions Coordinator:    I have insurance auth for CIR but do not have a bed for this pt. Today. I will follow for potential admit pending bed availability.  Clemens Catholic, [place], [place] Admissions Coordinator  [phone] ([place]) [phone] (office)

## 2022-01-13 NOTE — Progress Notes (Signed)
  Transition of Care [facility]) Screening Note   Patient Details  Name: Shelby Morgan Date of Birth: [birth year]   Transition [facility]) CM/SW Contact:    Pollie Friar, RN Phone Number: 01/13/2022, 2:02 PM   Patient is from home. CIR has received insurance authorization. Hope for admit to CIR tomorrow.  Transition of Care [facility]) has reviewed patient. We will continue to monitor patient advancement through interdisciplinary progression rounds. If new patient transition needs arise, please place a TOC consult.

## 2022-01-14 ENCOUNTER — Inpatient Hospital Stay (HOSPITAL_COMMUNITY)
Admission: RE | Admit: 2022-01-14 | Discharge: 2022-01-29 | DRG: 057 | Disposition: A | Discharge status: Home / Self Care | Payer: Medicare HMO | Source: Intra-hospital | Attending: Physical Medicine & Rehabilitation | Admitting: Physical Medicine & Rehabilitation

## 2022-01-14 ENCOUNTER — Other Ambulatory Visit: Payer: Self-pay

## 2022-01-14 ENCOUNTER — Encounter (HOSPITAL_COMMUNITY): Payer: Self-pay | Admitting: Physical Medicine & Rehabilitation

## 2022-01-14 DIAGNOSIS — Z7984 Long term (current) use of oral hypoglycemic drugs: Secondary | ICD-10-CM | POA: Diagnosis not present

## 2022-01-14 DIAGNOSIS — W228XXA Striking against or struck by other objects, initial encounter: Secondary | ICD-10-CM | POA: Diagnosis present

## 2022-01-14 DIAGNOSIS — Z981 Arthrodesis status: Secondary | ICD-10-CM | POA: Diagnosis not present

## 2022-01-14 DIAGNOSIS — E1169 Type 2 diabetes mellitus with other specified complication: Secondary | ICD-10-CM | POA: Diagnosis present

## 2022-01-14 DIAGNOSIS — Z6841 Body Mass Index (BMI) 40.0 and over, adult: Secondary | ICD-10-CM

## 2022-01-14 DIAGNOSIS — M7731 Calcaneal spur, right foot: Secondary | ICD-10-CM | POA: Diagnosis not present

## 2022-01-14 DIAGNOSIS — I6381 Other cerebral infarction due to occlusion or stenosis of small artery: Secondary | ICD-10-CM | POA: Diagnosis present

## 2022-01-14 DIAGNOSIS — M5416 Radiculopathy, lumbar region: Secondary | ICD-10-CM | POA: Diagnosis present

## 2022-01-14 DIAGNOSIS — K219 Gastro-esophageal reflux disease without esophagitis: Secondary | ICD-10-CM | POA: Diagnosis not present

## 2022-01-14 DIAGNOSIS — G890 Central pain syndrome: Secondary | ICD-10-CM | POA: Diagnosis not present

## 2022-01-14 DIAGNOSIS — M19071 Primary osteoarthritis, right ankle and foot: Secondary | ICD-10-CM | POA: Diagnosis not present

## 2022-01-14 DIAGNOSIS — Z79899 Other long term (current) drug therapy: Secondary | ICD-10-CM

## 2022-01-14 DIAGNOSIS — N179 Acute kidney failure, unspecified: Secondary | ICD-10-CM | POA: Diagnosis not present

## 2022-01-14 DIAGNOSIS — M79672 Pain in left foot: Secondary | ICD-10-CM | POA: Diagnosis not present

## 2022-01-14 DIAGNOSIS — Z85820 Personal history of malignant melanoma of skin: Secondary | ICD-10-CM

## 2022-01-14 DIAGNOSIS — Z885 Allergy status to narcotic agent status: Secondary | ICD-10-CM

## 2022-01-14 DIAGNOSIS — Z7982 Long term (current) use of aspirin: Secondary | ICD-10-CM

## 2022-01-14 DIAGNOSIS — E871 Hypo-osmolality and hyponatremia: Secondary | ICD-10-CM | POA: Diagnosis present

## 2022-01-14 DIAGNOSIS — E669 Obesity, unspecified: Secondary | ICD-10-CM | POA: Diagnosis not present

## 2022-01-14 DIAGNOSIS — D649 Anemia, unspecified: Secondary | ICD-10-CM | POA: Diagnosis not present

## 2022-01-14 DIAGNOSIS — R1011 Right upper quadrant pain: Secondary | ICD-10-CM

## 2022-01-14 DIAGNOSIS — F411 Generalized anxiety disorder: Secondary | ICD-10-CM

## 2022-01-14 DIAGNOSIS — E1122 Type 2 diabetes mellitus with diabetic chronic kidney disease: Secondary | ICD-10-CM

## 2022-01-14 DIAGNOSIS — R590 Localized enlarged lymph nodes: Secondary | ICD-10-CM | POA: Diagnosis present

## 2022-01-14 DIAGNOSIS — M7989 Other specified soft tissue disorders: Secondary | ICD-10-CM | POA: Diagnosis not present

## 2022-01-14 DIAGNOSIS — I10 Essential (primary) hypertension: Secondary | ICD-10-CM | POA: Diagnosis not present

## 2022-01-14 DIAGNOSIS — F419 Anxiety disorder, unspecified: Secondary | ICD-10-CM | POA: Diagnosis not present

## 2022-01-14 DIAGNOSIS — K760 Fatty (change of) liver, not elsewhere classified: Secondary | ICD-10-CM | POA: Diagnosis present

## 2022-01-14 DIAGNOSIS — E1140 Type 2 diabetes mellitus with diabetic neuropathy, unspecified: Secondary | ICD-10-CM | POA: Diagnosis present

## 2022-01-14 DIAGNOSIS — I63331 Cerebral infarction due to thrombosis of right posterior cerebral artery: Secondary | ICD-10-CM | POA: Diagnosis not present

## 2022-01-14 DIAGNOSIS — F32A Depression, unspecified: Secondary | ICD-10-CM | POA: Diagnosis present

## 2022-01-14 DIAGNOSIS — D72829 Elevated white blood cell count, unspecified: Secondary | ICD-10-CM

## 2022-01-14 DIAGNOSIS — M79671 Pain in right foot: Secondary | ICD-10-CM | POA: Diagnosis not present

## 2022-01-14 DIAGNOSIS — K118 Other diseases of salivary glands: Secondary | ICD-10-CM

## 2022-01-14 DIAGNOSIS — R160 Hepatomegaly, not elsewhere classified: Secondary | ICD-10-CM

## 2022-01-14 DIAGNOSIS — S99922A Unspecified injury of left foot, initial encounter: Secondary | ICD-10-CM | POA: Diagnosis present

## 2022-01-14 DIAGNOSIS — M48061 Spinal stenosis, lumbar region without neurogenic claudication: Secondary | ICD-10-CM | POA: Diagnosis present

## 2022-01-14 DIAGNOSIS — I69354 Hemiplegia and hemiparesis following cerebral infarction affecting left non-dominant side: Principal | ICD-10-CM

## 2022-01-14 DIAGNOSIS — E119 Type 2 diabetes mellitus without complications: Secondary | ICD-10-CM

## 2022-01-14 DIAGNOSIS — M109 Gout, unspecified: Secondary | ICD-10-CM | POA: Diagnosis not present

## 2022-01-14 DIAGNOSIS — R69 Illness, unspecified: Secondary | ICD-10-CM | POA: Diagnosis not present

## 2022-01-14 DIAGNOSIS — G4733 Obstructive sleep apnea (adult) (pediatric): Secondary | ICD-10-CM | POA: Diagnosis not present

## 2022-01-14 HISTORY — DX: Other cerebral infarction due to occlusion or stenosis of small artery: I63.81

## 2022-01-14 LAB — GLUCOSE, CAPILLARY
Glucose-Capillary: 269 mg/dL — ABNORMAL HIGH (ref 70–99)
Glucose-Capillary: 210 mg/dL — ABNORMAL HIGH (ref 70–99)
Glucose-Capillary: 172 mg/dL — ABNORMAL HIGH (ref 70–99)
Glucose-Capillary: 180 mg/dL — ABNORMAL HIGH (ref 70–99)

## 2022-01-14 MED ORDER — INSULIN ASPART 100 UNIT/ML IJ SOLN
0.0000 [IU] | Freq: Three times a day (TID) | INTRAMUSCULAR | Status: DC
  Administered 2022-01-15: 2 [IU] via SUBCUTANEOUS
  Administered 2022-01-15: 3 [IU] via SUBCUTANEOUS
  Administered 2022-01-15 – 2022-01-16 (×4): 2 [IU] via SUBCUTANEOUS
  Administered 2022-01-17 (×2): 1 [IU] via SUBCUTANEOUS
  Administered 2022-01-17: 2 [IU] via SUBCUTANEOUS
  Administered 2022-01-18 (×3): 1 [IU] via SUBCUTANEOUS
  Administered 2022-01-19 (×3): 2 [IU] via SUBCUTANEOUS
  Administered 2022-01-20 (×2): 1 [IU] via SUBCUTANEOUS
  Administered 2022-01-20: 2 [IU] via SUBCUTANEOUS
  Administered 2022-01-21 (×2): 1 [IU] via SUBCUTANEOUS
  Administered 2022-01-21: 2 [IU] via SUBCUTANEOUS
  Administered 2022-01-22: 1 [IU] via SUBCUTANEOUS
  Administered 2022-01-22: 3 [IU] via SUBCUTANEOUS
  Administered 2022-01-22: 1 [IU] via SUBCUTANEOUS
  Administered 2022-01-23 (×2): 2 [IU] via SUBCUTANEOUS
  Administered 2022-01-24: 5 [IU] via SUBCUTANEOUS
  Administered 2022-01-24: 1 [IU] via SUBCUTANEOUS
  Administered 2022-01-24: 3 [IU] via SUBCUTANEOUS
  Administered 2022-01-25 – 2022-01-26 (×4): 2 [IU] via SUBCUTANEOUS
  Administered 2022-01-26: 1 [IU] via SUBCUTANEOUS
  Administered 2022-01-26: 2 [IU] via SUBCUTANEOUS
  Administered 2022-01-27 (×2): 1 [IU] via SUBCUTANEOUS
  Administered 2022-01-27 – 2022-01-28 (×3): 2 [IU] via SUBCUTANEOUS
  Administered 2022-01-28 – 2022-01-29 (×2): 1 [IU] via SUBCUTANEOUS

## 2022-01-14 MED ORDER — ROSUVASTATIN CALCIUM 20 MG PO TABS
20.0000 mg | ORAL_TABLET | Freq: Every day | ORAL | Status: DC
  Administered 2022-01-15 – 2022-01-29 (×15): 20 mg via ORAL
  Filled 2022-01-14 (×15): qty 1

## 2022-01-14 MED ORDER — CLOPIDOGREL BISULFATE 75 MG PO TABS
75.0000 mg | ORAL_TABLET | Freq: Every day | ORAL | Status: DC
  Administered 2022-01-15 – 2022-01-29 (×15): 75 mg via ORAL
  Filled 2022-01-14 (×15): qty 1

## 2022-01-14 MED ORDER — ASPIRIN 81 MG PO TBEC
81.0000 mg | DELAYED_RELEASE_TABLET | Freq: Every day | ORAL | Status: AC
  Administered 2022-01-15 – 2022-01-28 (×14): 81 mg via ORAL
  Filled 2022-01-14 (×14): qty 1

## 2022-01-14 MED ORDER — ENOXAPARIN SODIUM 40 MG/0.4ML IJ SOSY
40.0000 mg | PREFILLED_SYRINGE | INTRAMUSCULAR | Status: DC
  Administered 2022-01-14 – 2022-01-28 (×14): 40 mg via SUBCUTANEOUS
  Filled 2022-01-14 (×15): qty 0.4

## 2022-01-14 MED ORDER — SENNOSIDES-DOCUSATE SODIUM 8.6-50 MG PO TABS
1.0000 | ORAL_TABLET | Freq: Every evening | ORAL | Status: DC | PRN
  Administered 2022-01-20 – 2022-01-21 (×2): 1 via ORAL
  Filled 2022-01-14 (×2): qty 1

## 2022-01-14 MED ORDER — ACETAMINOPHEN 325 MG PO TABS
325.0000 mg | ORAL_TABLET | ORAL | Status: DC | PRN
  Administered 2022-01-14: 325 mg via ORAL
  Administered 2022-01-15 – 2022-01-28 (×21): 650 mg via ORAL
  Filled 2022-01-14 (×23): qty 2

## 2022-01-14 MED ORDER — TRAMADOL HCL 50 MG PO TABS
50.0000 mg | ORAL_TABLET | Freq: Four times a day (QID) | ORAL | Status: DC | PRN
  Administered 2022-01-16 – 2022-01-28 (×21): 50 mg via ORAL
  Filled 2022-01-14 (×21): qty 1

## 2022-01-14 MED ORDER — DIPHENHYDRAMINE HCL 12.5 MG/5ML PO ELIX: 12.5000 mg | ORAL_SOLUTION | Freq: Four times a day (QID) | ORAL | Status: DC | PRN

## 2022-01-14 MED ORDER — FUROSEMIDE 20 MG PO TABS
20.0000 mg | ORAL_TABLET | Freq: Every morning | ORAL | Status: DC
  Administered 2022-01-15 – 2022-01-29 (×15): 20 mg via ORAL
  Filled 2022-01-14 (×15): qty 1

## 2022-01-14 MED ORDER — GUAIFENESIN-DM 100-10 MG/5ML PO SYRP: 5.0000 mL | ORAL_SOLUTION | Freq: Four times a day (QID) | ORAL | Status: DC | PRN

## 2022-01-14 MED ORDER — PROCHLORPERAZINE MALEATE 5 MG PO TABS: 5.0000 mg | ORAL_TABLET | Freq: Four times a day (QID) | ORAL | Status: DC | PRN

## 2022-01-14 MED ORDER — TRAZODONE HCL 50 MG PO TABS
100.0000 mg | ORAL_TABLET | Freq: Every evening | ORAL | Status: DC | PRN
  Administered 2022-01-15 – 2022-01-19 (×3): 100 mg via ORAL
  Filled 2022-01-14 (×4): qty 2

## 2022-01-14 MED ORDER — PROCHLORPERAZINE EDISYLATE 10 MG/2ML IJ SOLN: 5.0000 mg | Freq: Four times a day (QID) | INTRAMUSCULAR | Status: DC | PRN

## 2022-01-14 MED ORDER — PANTOPRAZOLE SODIUM 40 MG PO TBEC
40.0000 mg | DELAYED_RELEASE_TABLET | Freq: Every day | ORAL | Status: DC
  Administered 2022-01-15 – 2022-01-29 (×15): 40 mg via ORAL
  Filled 2022-01-14 (×15): qty 1

## 2022-01-14 MED ORDER — GABAPENTIN 100 MG PO CAPS
100.0000 mg | ORAL_CAPSULE | Freq: Every day | ORAL | Status: DC
  Administered 2022-01-14 – 2022-01-16 (×3): 100 mg via ORAL
  Filled 2022-01-14 (×3): qty 1

## 2022-01-14 MED ORDER — ROSUVASTATIN CALCIUM 20 MG PO TABS: 20.0000 mg | ORAL_TABLET | Freq: Every day | ORAL | 1 refills | Status: DC

## 2022-01-14 MED ORDER — ALUM & MAG HYDROXIDE-SIMETH 200-200-20 MG/5ML PO SUSP: 30.0000 mL | ORAL | Status: DC | PRN

## 2022-01-14 MED ORDER — LISINOPRIL 20 MG PO TABS
20.0000 mg | ORAL_TABLET | Freq: Every day | ORAL | Status: DC
Start: 2022-01-15 — End: 2022-01-28
  Administered 2022-01-15 – 2022-01-28 (×14): 20 mg via ORAL
  Filled 2022-01-14 (×14): qty 1

## 2022-01-14 MED ORDER — CLOPIDOGREL BISULFATE 75 MG PO TABS: 75.0000 mg | ORAL_TABLET | Freq: Every day | ORAL | 1 refills | Status: DC

## 2022-01-14 MED ORDER — PROCHLORPERAZINE 25 MG RE SUPP: 12.5000 mg | Freq: Four times a day (QID) | RECTAL | Status: DC | PRN

## 2022-01-14 MED ORDER — BUSPIRONE HCL 5 MG PO TABS
5.0000 mg | ORAL_TABLET | Freq: Two times a day (BID) | ORAL | Status: DC
  Administered 2022-01-15 – 2022-01-29 (×29): 5 mg via ORAL
  Filled 2022-01-14 (×29): qty 1

## 2022-01-14 MED ORDER — BISACODYL 10 MG RE SUPP: 10.0000 mg | Freq: Every day | RECTAL | Status: DC | PRN

## 2022-01-14 MED ORDER — POLYETHYLENE GLYCOL 3350 17 G PO PACK
17.0000 g | PACK | Freq: Every day | ORAL | Status: DC | PRN
  Administered 2022-01-21: 17 g via ORAL
  Filled 2022-01-14 (×2): qty 1

## 2022-01-14 MED ORDER — INSULIN ASPART 100 UNIT/ML IJ SOLN: 2.0000 [IU] | Freq: Three times a day (TID) | INTRAMUSCULAR | Status: DC

## 2022-01-14 MED ORDER — INSULIN ASPART 100 UNIT/ML IJ SOLN
0.0000 [IU] | Freq: Every day | INTRAMUSCULAR | Status: DC
  Administered 2022-01-15 – 2022-01-23 (×3): 2 [IU] via SUBCUTANEOUS

## 2022-01-14 MED ORDER — FLEET ENEMA 7-19 GM/118ML RE ENEM
1.0000 | ENEMA | Freq: Once | RECTAL | Status: DC | PRN
Start: 2022-01-14 — End: 2022-01-29

## 2022-01-14 MED ORDER — METFORMIN HCL ER 500 MG PO TB24
500.0000 mg | ORAL_TABLET | Freq: Two times a day (BID) | ORAL | Status: DC
  Administered 2022-01-15 – 2022-01-29 (×29): 500 mg via ORAL
  Filled 2022-01-14 (×29): qty 1

## 2022-01-14 NOTE — H&P (Signed)
Physical Medicine and Rehabilitation Admission H&P        Chief Complaint  Patient presents with   Functional deficits due to stroke      HPI: Shelby Morgan is a 66 year old female with history of HTN, NAFLD, HTN, T2DM, morbid obesity--BMI 41.9, melanoma (Dr. Nevada Crane), anxiety d/o, CVA 09/22; who was admitted via [facility] on 01/06/22 with sudden onset of severe numbness left face/arm/leg and weakness. CT head negative and TNK administered and she was transferred to [facility] for management.  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 MRI brain showed right thalamocapsular infarct with mild to moderate chronic microvascular changes.    Dr. Erlinda Hong felt that stroke was due to small vessel disease  and recommended DAPT X 3 weeks followed by Plavix alone. She qualified for Sleep Smart study and is tolerating CPAP use. She continues to be limited by mild left hemiparesis with sensory loss, balance deficits and weakness. She has noted pain in her LUE and LLE with certain activities. Reports residual sensory loss in RUE and RLE from prior CVA.  CIR was recommended due to functional decline.      Review of Systems  Constitutional:  Negative for chills and fever.  HENT:  Negative for hearing loss.   Eyes:  Negative for blurred vision.  Respiratory:  Negative for shortness of breath.   Cardiovascular:  Positive for leg swelling (prior to admission). Negative for chest pain and palpitations.  Gastrointestinal:  Negative for constipation, nausea and vomiting.  Genitourinary:  Negative for dysuria.  Musculoskeletal:  Positive for back pain and myalgias.  Neurological:  Positive for sensory change (numbness left face, throat, hemi body).   Psychiatric/Behavioral:  The patient is not nervous/anxious.             Past Medical History:  Diagnosis Date   Anxiety     Bell's palsy 01/08/2011   Chronic back pain     Depression     Diabetes mellitus     GERD (gastroesophageal reflux disease)     Hepatomegaly     Hypertension     Melanoma ([place]) 2009    Dr Nevada Crane, Stage 2, required only surgery   NAFLD (nonalcoholic fatty liver disease)     Skin cancer     Stroke ([place]) 07/2021           Past Surgical History:  Procedure Laterality Date   CESAREAN SECTION       COLONOSCOPY   03/09/2011    Dr Oneida Alar diverticulosis, internal and external hemorrhoids   ESOPHAGOGASTRODUODENOSCOPY   03/09/2011    Esophageal stricture dilated to 16 MM savory, NSAID- induced gastritis and duodenitis   LEG SURGERY        plates/screws/hx fx-right leg   MELANOMA EXCISION        right knee   SAVORY DILATION   03/09/2011           Family History  Problem Relation Age of Onset   Heart disease Mother     Heart disease Maternal Grandmother     Heart disease Maternal Grandfather     COPD Father     Bladder Cancer Father     Anesthesia problems Neg Hx     Hypotension Neg Hx  Malignant hyperthermia Neg Hx     Pseudochol deficiency Neg Hx     Colon cancer Neg Hx        Social History: Widowed. 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  She reports that she quit smoking about 35 years ago. Her smoking use included cigarettes. She has a 5.50 pack-year smoking history. She has never used smokeless tobacco. She drinks alcohol--glass of wine on special occassions.  She reports that she does not use drugs.         Allergies  Allergen Reactions   Codeine Nausea And Vomiting            Medications Prior to Admission  Medication Sig Dispense Refill   acetaminophen (TYLENOL) 325 MG tablet Take 650 mg by mouth every 6 (six) hours as needed for moderate pain or mild pain.       aspirin EC 81 MG EC tablet Take 1 tablet  (81 mg total) by mouth daily. Swallow whole. 30 tablet 11   busPIRone (BUSPAR) 5 MG tablet Take 5 mg by mouth 2 (two) times daily.   5   furosemide (LASIX) 20 MG tablet Take 20 mg by mouth every morning.       lisinopril (ZESTRIL) 20 MG tablet Take 20 mg by mouth daily.       MAGNESIUM PO Take 1 tablet by mouth at bedtime. Unsure of dose       meloxicam (MOBIC) 15 MG tablet Take 15 mg by mouth daily.       metFORMIN (GLUCOPHAGE-XR) 500 MG 24 hr tablet Take 1,000 mg by mouth 2 (two) times daily.        Multiple Vitamins-Minerals (CENTRUM SILVER 50+WOMEN) TABS Take 1 tablet by mouth daily.       naproxen sodium (ALEVE) 220 MG tablet Take 220 mg by mouth daily.       pantoprazole (PROTONIX) 40 MG tablet TAKE ONE TABLET BY MOUTH ONCE DAILY (Patient taking differently: Take 40 mg by mouth every morning.) 30 tablet 11   Potassium 99 MG TABS Take 99 mg by mouth daily.       simvastatin (ZOCOR) 40 MG tablet Take 1 tablet (40 mg total) by mouth every evening. 30 tablet 3   sitaGLIPtin (JANUVIA) 100 MG tablet Take 100 mg by mouth daily at 4 PM.       traMADol (ULTRAM) 50 MG tablet Take 50 mg by mouth every 4 (four) hours as needed for moderate pain.       traZODone (DESYREL) 100 MG tablet Take 100 mg by mouth at bedtime as needed for sleep.   5   clopidogrel (PLAVIX) 75 MG tablet Take 75 mg by mouth daily. (Patient not taking: Reported on 01/06/2022)              Home: Home Living Family/patient expects to be discharged to:: Inpatient rehab Living Arrangements: Alone Available Help at Discharge: Family Type of Home: House Home Access: Stairs to enter [employer] of Steps: 7 Entrance Stairs-Rails: Can reach both Home Layout: One level Bathroom Shower/Tub: Chiropodist: [place]: Grab bars - tub/shower, Conservation officer, nature (2 wheels), [employer] - single point  Lives With: Alone   Functional History: Prior Function Prior Level of Function :  Independent/Modified Independent, Driving Mobility Comments: no AD ADLs Comments: independent   Functional Status:  Mobility: Bed Mobility Overal bed mobility: Needs Assistance Bed Mobility: Supine to Sit Rolling: Min assist Sidelying to sit:  Mod assist, +2 for safety/equipment, Min assist Supine to sit: Min assist Sit to supine: Mod assist General bed mobility comments: trunk support provided Transfers Overall transfer level: Needs assistance Equipment used: 2 person hand held assist Transfers: Sit to/from Stand Sit to Stand: Mod assist, +2 physical assistance, Min assist General transfer comment: maximal cues including hand over hand for LUE placement with transfers as well as positioning of LLE. with standing from the hospital bed, patient required the most support of Mod A +2 person. she required Min A +2 for standing from a chair with arms. Ambulation/Gait Ambulation/Gait assistance: Mod assist, +2 physical assistance Gait Distance (Feet): 10 Feet (x 2) Assistive device: 2 person hand held assist Gait Pattern/deviations: Ataxic, Decreased stride length, Decreased weight shift to right General Gait Details: patient with varying step length on LLE with cues required for safety and appropriate step length. faciliation for weight shifting to right with left lean more pronounced with fatigue. intermittent standing rest breaks required for short distance ambulation. consider chair follow for safety Gait velocity interpretation: <1.31 ft/sec, indicative of household ambulator Pre-gait activities: left knee buckling noted x 1 bout initially after standing. weight shifting performed to right and left to assess readiness for ambulation with no knee buckling further.   ADL: ADL Overall ADL's : Needs assistance/impaired Eating/Feeding: Minimal assistance Eating/Feeding Details (indicate cue type and reason): pt requires cueing to utilize L hand to hold cup, educated on guiding cup with R  hand. Grooming: Minimal assistance, Sitting Grooming Details (indicate cue type and reason): able to wash face with R UE, wash hands with increased time but would require assist with manging toothpaste Upper Body Dressing : Moderate assistance, Sitting Lower Body Dressing: Moderate assistance, +2 for physical assistance, +2 for safety/equipment, Sit to/from stand Lower Body Dressing Details (indicate cue type and reason): able to manage socks at EOB with min guard, mod asisst +2 in standing Toilet Transfer: Moderate assistance, +2 for physical assistance, +2 for safety/equipment, Ambulation Toilet Transfer Details (indicate cue type and reason): simulated side stepping towards [facility] Toileting- Clothing Manipulation and Hygiene: Total assistance, Sit to/from stand Functional mobility during ADLs: Moderate assistance, +2 for physical assistance, +2 for safety/equipment, Minimal assistance General ADL Comments: relies on BUE support in standing   Cognition: Cognition Overall Cognitive Status: Within Functional Limits for tasks assessed Arousal/Alertness: Awake/alert Orientation Level: Oriented X4 Year: 2023 Month: June Day of Week: Correct Attention: Focused, Sustained Focused Attention: Appears intact Sustained Attention: Appears intact Memory: Impaired Memory Impairment:  (Immediate: 5/5; delayed: 5/5; paragraph: 6/8) Awareness: Appears intact Problem Solving: Impaired Problem Solving Impairment: Verbal complex (Money: 1/3; time: 1/1) Executive Function: Sequencing, Technical brewer: Impaired Sequencing Impairment: Verbal complex (clock drawing: 2/4 with self-correction) Organizing: Impaired Organizing Impairment: Verbal complex (backward digit span: 1/2) Cognition Arousal/Alertness: Awake/alert Behavior During Therapy: WFL for tasks assessed/performed Overall Cognitive Status: Within Functional Limits for tasks assessed Area of Impairment: Following commands, Awareness,  Problem solving, Attention, Safety/judgement Current Attention Level: Sustained Memory: Decreased short-term memory Following Commands: Follows one step commands consistently, Follows one step commands with increased time, Follows multi-step commands inconsistently Safety/Judgement: Decreased awareness of safety Awareness: Emergent Problem Solving: Slow processing, Requires verbal cues, Difficulty sequencing, Requires tactile cues General Comments: easily distracted at times and required cues for attention to task     Blood pressure 128/73, pulse 90, temperature 98.7 F (37.1 C), temperature source Oral, resp. rate 17, weight 104.1 kg, SpO2 95 %.   General: Alert and oriented x 3, No apparent  distress HEENT: Head is normocephalic, atraumatic, PERRLA, EOMI, sclera anicteric, oral mucosa pink and moist,  Neck: Supple without JVD or lymphadenopathy Heart: Reg rate and rhythm. No murmurs rubs or gallops Chest: CTA bilaterally without wheezes, rales, or rhonchi; no distress Abdomen: Soft, non-tender, non-distended, bowel sounds positive. Obese Extremities: No clubbing, cyanosis, or edema. Pulses are 2+ Psych: Pt's affect is appropriate. Pt is cooperative Skin: Clean and intact without signs of breakdown Neuro: Awake and alert ,follows 1 and 2-step commands without difficulty, did not note any dysarthria or aphasia, cranial nerves II through XII intact other than decreased sensation on left face, minimal left facial droop Decreased fine motor movements in the left hand Strength 5 out of 5 in right lower extremity and right upper extremity Strength 4 out of 5 in left lower extremity Strength 4- out of 5 left shoulder abduction and elbow extension, 4 out of 5 elbow flexion and finger flexion Decreased sensation to light touch and cold throughout left hemibody Abnormal finger-nose left upper extremity Decreased sensation to light touch in distal right lower extremity-history of diabetic  polyneuropathy Musculoskeletal: No joint swelling or tenderness noted, no abnormal tone noted, no range of motion deficits noted   Lab Results Last 48 Hours        Results for orders placed or performed during the hospital encounter of 01/06/22 (from the past 48 hour(s))  Glucose, capillary     Status: Abnormal    Collection Time: 01/10/22  5:22 PM  Result Value Ref Range    Glucose-Capillary 145 (H) 70 - 99 mg/dL      Comment: Glucose reference range applies only to samples taken after fasting for at least 8 hours.    Comment 1 Notify RN      Comment 2 Document in Chart    Glucose, capillary     Status: Abnormal    Collection Time: 01/10/22 10:26 PM  Result Value Ref Range    Glucose-Capillary 194 (H) 70 - 99 mg/dL      Comment: Glucose reference range applies only to samples taken after fasting for at least 8 hours.    Comment 1 Notify RN      Comment 2 Document in Chart    CBC     Status: Abnormal    Collection Time: 01/11/22  3:07 AM  Result Value Ref Range    WBC 12.6 (H) 4.0 - 10.5 K/uL    RBC 4.61 3.87 - 5.11 MIL/uL    Hemoglobin 12.9 12.0 - 15.0 g/dL    HCT 38.7 36.0 - 46.0 %    MCV 83.9 80.0 - 100.0 fL    MCH 28.0 26.0 - 34.0 pg    MCHC 33.3 30.0 - 36.0 g/dL    RDW 13.7 11.5 - 15.5 %    Platelets 376 150 - 400 K/uL    nRBC 0.0 0.0 - 0.2 %      Comment: Performed at [facility], [place] [address], [place], [place] [address]  Basic metabolic panel     Status: Abnormal    Collection Time: 01/11/22  3:07 AM  Result Value Ref Range    Sodium 137 135 - 145 mmol/L    Potassium 4.1 3.5 - 5.1 mmol/L    Chloride 99 98 - 111 mmol/L    CO2 26 22 - 32 mmol/L    Glucose, Bld 214 (H) 70 - 99 mg/dL      Comment: Glucose reference range applies only to samples taken after  fasting for at least 8 hours.    BUN 20 8 - 23 mg/dL    Creatinine, Ser 1.17 (H) 0.44 - 1.00 mg/dL    Calcium 9.4 8.9 - 10.3 mg/dL    GFR, Estimated 52 (L) >60 mL/min      Comment: (NOTE) Calculated using  the CKD-EPI Creatinine Equation (2021)      Anion gap 12 5 - 15      Comment: Performed at [facility] [address], [place], [place] [address]  Glucose, capillary     Status: Abnormal    Collection Time: 01/11/22  5:50 AM  Result Value Ref Range    Glucose-Capillary 227 (H) 70 - 99 mg/dL      Comment: Glucose reference range applies only to samples taken after fasting for at least 8 hours.    Comment 1 Notify RN      Comment 2 Document in Chart    Glucose, capillary     Status: Abnormal    Collection Time: 01/11/22  9:05 AM  Result Value Ref Range    Glucose-Capillary 241 (H) 70 - 99 mg/dL      Comment: Glucose reference range applies only to samples taken after fasting for at least 8 hours.  Glucose, capillary     Status: Abnormal    Collection Time: 01/11/22 12:34 PM  Result Value Ref Range    Glucose-Capillary 181 (H) 70 - 99 mg/dL      Comment: Glucose reference range applies only to samples taken after fasting for at least 8 hours.  Glucose, capillary     Status: Abnormal    Collection Time: 01/11/22  4:45 PM  Result Value Ref Range    Glucose-Capillary 199 (H) 70 - 99 mg/dL      Comment: Glucose reference range applies only to samples taken after fasting for at least 8 hours.  Glucose, capillary     Status: Abnormal    Collection Time: 01/11/22  9:55 PM  Result Value Ref Range    Glucose-Capillary 225 (H) 70 - 99 mg/dL      Comment: Glucose reference range applies only to samples taken after fasting for at least 8 hours.  CBC     Status: Abnormal    Collection Time: 01/12/22  3:43 AM  Result Value Ref Range    WBC 10.7 (H) 4.0 - 10.5 K/uL    RBC 4.37 3.87 - 5.11 MIL/uL    Hemoglobin 12.2 12.0 - 15.0 g/dL    HCT 36.9 36.0 - 46.0 %    MCV 84.4 80.0 - 100.0 fL    MCH 27.9 26.0 - 34.0 pg    MCHC 33.1 30.0 - 36.0 g/dL    RDW 13.6 11.5 - 15.5 %    Platelets 332 150 - 400 K/uL    nRBC 0.0 0.0 - 0.2 %      Comment: Performed at [facility], [place] [address] [place], [place] [address]  Basic metabolic panel     Status: Abnormal    Collection Time: 01/12/22  3:43 AM  Result Value Ref Range    Sodium 135 135 - 145 mmol/L    Potassium 4.0 3.5 - 5.1 mmol/L    Chloride 102 98 - 111 mmol/L    CO2 22 22 - 32 mmol/L    Glucose, Bld 208 (H) 70 - 99 mg/dL      Comment: Glucose reference range applies only to samples taken after fasting for at least  8 hours.    BUN 23 8 - 23 mg/dL    Creatinine, Ser 1.27 (H) 0.44 - 1.00 mg/dL    Calcium 9.0 8.9 - 10.3 mg/dL    GFR, Estimated 47 (L) >60 mL/min      Comment: (NOTE) Calculated using the CKD-EPI Creatinine Equation (2021)      Anion gap 11 5 - 15      Comment: Performed at [facility] [address], [place], [place] [address]  Glucose, capillary     Status: Abnormal    Collection Time: 01/12/22  6:24 AM  Result Value Ref Range    Glucose-Capillary 190 (H) 70 - 99 mg/dL      Comment: Glucose reference range applies only to samples taken after fasting for at least 8 hours.  Glucose, capillary     Status: Abnormal    Collection Time: 01/12/22 11:25 AM  Result Value Ref Range    Glucose-Capillary 269 (H) 70 - 99 mg/dL      Comment: Glucose reference range applies only to samples taken after fasting for at least 8 hours.      Imaging Results (Last 48 hours)  No results found.         Blood pressure 128/73, pulse 90, temperature 98.7 F (37.1 C), temperature source Oral, resp. rate 17, weight 104.1 kg, SpO2 95 %.   Medical Problem List and Plan: 1. Functional deficits secondary to  right thalamocapsular infarct with mild to moderate chronic microvascular changes.              -patient may shower             -ELOS/Goals: 10-12 days             -Admit to CIR 2.  Antithrombotics: -DVT/anticoagulation:  Pharmaceutical: Lovenox             -antiplatelet therapy: DAPT X 3 weeks followed by plavix alone.  3. Pain Management: tylenol prn.  4. Mood/Sleep: LCSW to follow for evaluation and  support.              -antipsychotic agents: N/A 5. Neuropsych/cognition: This patient is capable of making decisions on her own behalf. 6. Skin/Wound Care: Routine pressure relief measures.  7. Fluids/Electrolytes/Nutrition: Monitor I/O. Check CMET in am.  8. T2DM: Hgb A1c- 7.1. Was on Januvia 100/Metform 1000 mg BID PTA.  --Monitor BS ac/hs and use SSI for elevated BS.              --CBG 172-269, follow trend, consider additional medication 9. HTN: Monitor BP TID--continue Lisinopril and Lasix             -BP goal <180/105             -BP has been under good control overall 10. Non-alcoholic fatty liver disease: Stable. Avoid hepatotoxic medications.             --LFTs WNL. 11. H/o Melanoma/Persistent Mediastinal adenopathy: Per records review--was evaluated by Dr. Delton Coombes with repeat CT chest recommended but no follow up noted in records.  12. Lumbar stenosis with RLE radiculopathy: ESI by Dr. Ernestina Patches effective in the past.             --now with discomfort due to lack of activity.  --monitor for now. Tramadol prn added.   13. Sleep smart study/OSA: Has been compliant with CPAP use.  14. Low grade fever/Leucocytosis:   Resolving. Monitor for signs of infection. 15. AKI: SCr up likely  due to addition of ACE/Better BP control -Encourage oral fluids 16. Left parotid nodule: Follow up with ENT after discharge.            -- 17. 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  --Will start low dose gabapentin.                   I have personally performed a face to face diagnostic evaluation of this patient and formulated the key components of the plan.  Additionally, I have personally reviewed laboratory data, imaging studies, as well as relevant notes and concur with the physician assistant's documentation above.   The patient's status has not changed  from the original H&P.  Any changes in documentation from the acute care chart have been noted above.   Jennye Boroughs, MD, [facility]     Bary Leriche, PA-C 01/12/2022

## 2022-01-14 NOTE — Progress Notes (Signed)
Physical Therapy Treatment Patient Details Name: Shelby Morgan MRN: [id number] DOB: [birth year] Today's Date: 01/14/2022   History of Present Illness pt is a 66 y/o female admitted 6/13 with Left sided numbness and weakness.  MRI shows a 1.2 cm acute right thalamocapsular infarct--non hemorrhagic.Marland Kitchen  PMHx Bells palsy, HLD, DM, HTN, melanoma, Left thalamic stroke 2022, NAFLD    PT Comments    Pt is progressing well toward goals and AIR.  Emphasis on transitions, scooting, sit to stand safety and technique and progression of gait stability and quality, given significant diminished sensation L upper and LE's   Recommendations for follow up therapy are one component of a multi-disciplinary discharge planning process, led by the attending physician.  Recommendations may be updated based on patient status, additional functional criteria and insurance authorization.  Follow Up Recommendations  Acute inpatient rehab (3hours/day)     Assistance Recommended at Discharge Frequent or constant Supervision/Assistance  Patient can return home with the following A little help with bathing/dressing/bathroom;Assistance with cooking/housework;Direct supervision/assist for medications management;Direct supervision/assist for financial management;Assist for transportation;Help with stairs or ramp for entrance;A lot of help with walking and/or transfers   Equipment Recommendations  Other (comment)    Recommendations for Other Services Rehab consult     Precautions / Restrictions Precautions Precautions: Fall     Mobility  Bed Mobility Overal bed mobility: Needs Assistance Bed Mobility: Supine to Sit, Sit to Supine     Supine to sit: Min assist Sit to supine: Min assist   General bed mobility comments: worked on scooting with correct placement of L>R UE.  On return, work on scooting up toward [facility] with use of UE's    Transfers Overall transfer level: Needs assistance Equipment used: Rolling  walker (2 wheels) Transfers: Sit to/from Stand Sit to Stand: Mod assist           General transfer comment: VC's for hand placement on seated surface for safety. Light mod assist for coming forward and boost.    Ambulation/Gait Ambulation/Gait assistance: Mod assist Gait Distance (Feet): 60 Feet Assistive device: Rolling walker (2 wheels) Gait Pattern/deviations: Step-to pattern, Decreased stride length Gait velocity: Decreased Gait velocity interpretation: <1.31 ft/sec, indicative of household ambulator   General Gait Details: pt with mildly unsteady step to gait on the L.  When looks at her feet, pt unable to control her swing to and step on the L, with larger more uncoordinated step if looks forward.  Work on improving heel toe with occasional   Marine scientist Rankin (Stroke Patients Only) Modified Rankin (Stroke Patients Only) Pre-Morbid Rankin Score: Slight disability Modified Rankin: Moderately severe disability     Balance Overall balance assessment: Needs assistance   Sitting balance-Leahy Scale: Fair       Standing balance-Leahy Scale: Poor Standing balance comment: reliant on external support and or AD                            Cognition Arousal/Alertness: Awake/alert Behavior During Therapy: WFL for tasks assessed/performed Overall Cognitive Status: Within Functional Limits for tasks assessed                                          Exercises      General Comments General  comments (skin integrity, edema, etc.): vss      Pertinent Vitals/Pain Pain Assessment Pain Assessment: Faces Faces Pain Scale: Hurts little more Pain Location: R heel (started to hav epain on movement L LE.) Pain Descriptors / Indicators: Discomfort Pain Intervention(s): Monitored during session    Home Living                          Prior Function            PT Goals (current goals  can now be found in the care plan section) Acute Rehab PT Goals PT Goal Formulation: With patient Time For Goal Achievement: 01/21/22 Potential to Achieve Goals: Good Progress towards PT goals: Progressing toward goals    Frequency    Min 4X/week      PT Plan Current plan remains appropriate    Co-evaluation              AM-PAC PT "6 Clicks" Mobility   Outcome Measure  Help needed turning from your back to your side while in a flat bed without using bedrails?: A Little Help needed moving from lying on your back to sitting on the side of a flat bed without using bedrails?: A Little Help needed moving to and from a bed to a chair (including a wheelchair)?: A Lot Help needed standing up from a chair using your arms (e.g., wheelchair or bedside chair)?: A Lot Help needed to walk in hospital room?: Total Help needed climbing 3-5 steps with a railing? : Total 6 Click Score: 12    End of Session   Activity Tolerance: Patient tolerated treatment well Patient left: in bed Nurse Communication: Mobility status PT Visit Diagnosis: Other abnormalities of gait and mobility (R26.89);Hemiplegia and hemiparesis;Other symptoms and signs involving the nervous system (R29.898) Hemiplegia - caused by: Cerebral infarction     Time: 1550-1610 PT Time Calculation (min) (ACUTE ONLY): 20 min  Charges:  $Gait Training: 8-22 mins                     01/14/2022  Shelby Carne., PT Acute Rehabilitation Services [phone]  (pager) [phone]  (office)   Shelby Morgan Shelby Morgan 01/14/2022, 5:23 PM

## 2022-01-14 NOTE — Progress Notes (Signed)
STROKE TEAM PROGRESS NOTE   SUBJECTIVE (INTERVAL HISTORY) Patient is awake and alert sitting up in bed  Neurological exam remains unchanged with mild left hemiparesis and sensory loss.     No new neurological events overnight. No voiced complaints.   Patient has a rehab bed and is being transferred to inpatient rehab today OBJECTIVE Temp:  [98 F (36.7 C)-98.7 F (37.1 C)] 98 F (36.7 C) (06/21 1120) Pulse Rate:  [74-100] 74 (06/21 1120) Cardiac Rhythm: Normal sinus rhythm (06/21 0840) Resp:  [17-18] 18 (06/21 0344) BP: (123-153)/(68-82) 133/70 (06/21 1120) SpO2:  [92 %-98 %] 96 % (06/21 1120)  Recent Labs  Lab 01/13/22 0624 01/13/22 1202 01/13/22 1643 01/14/22 0904 01/14/22 1122  GLUCAP 217* 190* 195* 269* 210*   Recent Labs  Lab 01/08/22 0904 01/09/22 0226 01/10/22 0248 01/11/22 0307 01/12/22 0343  NA 142 140 136 137 135  K 3.9 4.3 3.7 4.1 4.0  CL 103 101 100 99 102  CO2 '26 27 25 26 22  '$ GLUCOSE 215* 173* 170* 214* 208*  BUN '14 19 17 20 23  '$ CREATININE 0.95 1.04* 1.05* 1.17* 1.27*  CALCIUM 9.6 9.5 9.4 9.4 9.0   No results for input(s): "AST", "ALT", "ALKPHOS", "BILITOT", "PROT", "ALBUMIN" in the last 168 hours.  Recent Labs  Lab 01/08/22 0904 01/09/22 0226 01/10/22 0248 01/11/22 0307 01/12/22 0343  WBC 8.0 10.5 9.4 12.6* 10.7*  HGB 12.7 12.3 13.1 12.9 12.2  HCT 39.2 38.5 40.4 38.7 36.9  MCV 84.7 84.8 84.7 83.9 84.4  PLT 345 362 362 376 332   No results for input(s): "CKTOTAL", "CKMB", "CKMBINDEX", "TROPONINI" in the last 168 hours. No results for input(s): "LABPROT", "INR" in the last 72 hours.  No results for input(s): "COLORURINE", "LABSPEC", "PHURINE", "GLUCOSEU", "HGBUR", "BILIRUBINUR", "KETONESUR", "PROTEINUR", "UROBILINOGEN", "NITRITE", "LEUKOCYTESUR" in the last 72 hours.  Invalid input(s): "APPERANCEUR"      Component Value Date/Time   CHOL 156 01/07/2022 0418   TRIG 107 01/07/2022 0418   HDL 53 01/07/2022 0418   CHOLHDL 2.9 01/07/2022 0418    VLDL 21 01/07/2022 0418   LDLCALC 82 01/07/2022 0418   Lab Results  Component Value Date   HGBA1C 7.1 (H) 01/07/2022      Component Value Date/Time   LABOPIA NONE DETECTED 01/06/2022 1927   COCAINSCRNUR NONE DETECTED 01/06/2022 1927   LABBENZ NONE DETECTED 01/06/2022 1927   AMPHETMU NONE DETECTED 01/06/2022 1927   THCU NONE DETECTED 01/06/2022 1927   LABBARB NONE DETECTED 01/06/2022 1927    No results for input(s): "ETH" in the last 168 hours.   I have personally reviewed the radiological images below and agree with the radiology interpretations.  CT ANGIO HEAD NECK W WO CM  Result Date: 01/08/2022 CLINICAL DATA:  Follow-up examination for acute stroke. EXAM: CT ANGIOGRAPHY HEAD AND NECK TECHNIQUE: Multidetector CT imaging of the head and neck was performed using the standard protocol during bolus administration of intravenous contrast. Multiplanar CT image reconstructions and MIPs were obtained to evaluate the vascular anatomy. Carotid stenosis measurements (when applicable) are obtained utilizing NASCET criteria, using the distal internal carotid diameter as the denominator. RADIATION DOSE REDUCTION: This exam was performed according to the departmental dose-optimization program which includes automated exposure control, adjustment of the mA and/or kV according to patient size and/or use of iterative reconstruction technique. CONTRAST:  76m OMNIPAQUE IOHEXOL 350 MG/ML SOLN COMPARISON:  Prior MRI from 01/06/2022 as well as prior CTA from 04/10/2021 and chest CT from 06/06/2018. FINDINGS: CT HEAD FINDINGS  Brain: There has been continued interval evolution of previously identified acute infarct involving the right thalamic capsular region, stable in size as compared to previous MRI. No associated mass effect or evidence for hemorrhagic transformation. No other acute intracranial hemorrhage or large vessel territory infarct. Underlying atrophy with mild chronic small vessel ischemic disease  noted. Small remote left thalamic lacunar infarct noted. No mass lesion or midline shift. No hydrocephalus or extra-axial fluid collection. Vascular: No hyperdense vessel. Scattered vascular calcifications noted within the carotid siphons. Skull: Scalp soft tissues and calvarium within normal limits. Sinuses: Paranasal sinuses are largely clear.  No mastoid effusion. Orbits: Globes orbital soft tissues demonstrate no acute finding. Review of the MIP images confirms the above findings CTA NECK FINDINGS Aortic arch: Visualized aortic arch normal in caliber with standard 3 vessel branching pattern. Mild plaque within the arch itself. No stenosis about the origin of the great vessels. Right carotid system: Right common and internal carotid arteries patent without stenosis or dissection. Mild for age plaque about the right carotid bulb without stenosis. Left carotid system: Left common and internal carotid arteries patent without stenosis or dissection. Mild for age plaque about the left carotid bulb without significant stenosis. Vertebral arteries: Both vertebral arteries arise from the subclavian arteries. No proximal subclavian artery stenosis. Both vertebral arteries widely patent without stenosis, dissection or occlusion. Skeleton: No discrete or worrisome osseous lesions. Segmental fusion of the C3 and C4 vertebral bodies noted. Moderate spondylosis noted at C5-6 and C6-7. Other neck: No other acute soft tissue abnormality within the neck. 8 mm soft tissue nodule present at the superior aspect left parotid gland, indeterminate. Few additional scattered subcentimeter nodular densities about the bilateral parotid glands favored to reflect intraparotid lymph nodes. 9 mm left thyroid nodule, of doubtful significance given size and patient age, no follow-up imaging recommended (ref: J Am Coll Radiol. 2015 Feb;12(2): 143-50). Upper chest: Visualized upper chest demonstrates no acute finding. Irregular soft tissue density  partially visualized at the level of the AP window (series 5, image 161), likely adenopathy as seen on prior studies. This appears somewhat increased in size measuring up to 2.5 cm (previously 1.9 cm on prior chest CT from 06/06/2018. Review of the MIP images confirms the above findings CTA HEAD FINDINGS Anterior circulation: Petrous segments patent bilaterally. Atheromatous change within the carotid siphons without hemodynamically significant stenosis. A1 segments, anterior communicating artery complex common anterior cerebral arteries patent without stenosis. No M1 stenosis or occlusion. No proximal MCA branch occlusion. Distal left MCA branches widely patent. There is a focal severe proximal right M3 stenosis (series 13, image 76). Right MCA branches otherwise patent and well perfused. Posterior circulation: Both V4 segments patent without significant stenosis. Left PICA patent. Right PICA origin not well seen. Basilar patent to its distal aspect without stenosis superior cerebellar arteries patent proximally. Both PCAs primarily supplied via the basilar. Focal moderate proximal left P2 stenosis (series 12, image 117). Additional severe distal left P3 stenoses (series 12, image 137). Left PCA attenuated but patent distally. On the right, there is a focal severe right P2 stenosis (series 12, image 115) right PCA mildly irregular but otherwise patent to its distal aspect. Venous sinuses: Patent allowing for timing the contrast bolus. Anatomic variants: None significant.  No aneurysm. Review of the MIP images confirms the above findings IMPRESSION: CT HEAD IMPRESSION: 1. Normal expected interval evolution of acute right thalamic infarct, stable in size relative to previous MRI. No evidence for hemorrhagic transformation or other complication. 2.  No other new acute intracranial abnormality. 3. Underlying atrophy with chronic small vessel ischemic disease. CTA HEAD AND NECK: 1. Negative CTA for large vessel occlusion.  2. Intracranial atherosclerotic disease with multifocal moderate to severe right M3, bilateral P2, and distal left P3 stenoses as above. No proximal high-grade or correctable stenosis. Overall, appearance is similar as compared to prior CTA from 04/10/2021. 3. Wide patency of the major arterial vasculature within the neck. 4. Mediastinal adenopathy, partially visualized and incompletely assessed on this exam. Further evaluation with dedicated cross-sectional imaging of the chest suggested for complete evaluation. 5. 8 mm nodular lesion within the superior left parotid gland, indeterminate. While this may reflect an intraparotid lymph node, a possible small primary salivary neoplasm could also have this appearance. Nonemergent outpatient ENT referral for further workup suggested. Electronically Signed   By: Jeannine Boga M.D.   On: 01/08/2022 05:24   ECHOCARDIOGRAM COMPLETE  Result Date: 01/07/2022    ECHOCARDIOGRAM REPORT   Patient Name:   Shelby Morgan Date of Exam: 01/07/2022 Medical Rec #:  [id number]      Height:       62.0 in Accession #:    [id number]     Weight:       230.6 lb Date of Birth:  02/08/1956      BSA:          2.031 m Patient Age:    89 years       BP:           161/93 mmHg Patient Gender: F              HR:           82 bpm. Exam Location:  Inpatient Procedure: 2D Echo, Cardiac Doppler and Color Doppler Indications:    Stroke  History:        Patient has prior history of Echocardiogram examinations, most                 recent 04/11/2021. Risk Factors:Hypertension, Diabetes and HLD.  Sonographer:    Joette Catching RCS Referring Phys: [place]  1. Left ventricular ejection fraction, by estimation, is 60 to 65%. The left ventricle has normal function. The left ventricle has no regional wall motion abnormalities. There is mild left ventricular hypertrophy. Left ventricular diastolic parameters are indeterminate.  2. Right ventricular systolic function is normal. The  right ventricular size is normal.  3. Trivial mitral valve regurgitation.  4. The aortic valve is tricuspid. Aortic valve regurgitation is not visualized. Aortic valve sclerosis is present, with no evidence of aortic valve stenosis.  5. The inferior vena cava is normal in size with greater than 50% respiratory variability, suggesting right atrial pressure of 3 mmHg. Comparison(s): The left ventricular function is unchanged. FINDINGS  Left Ventricle: Left ventricular ejection fraction, by estimation, is 60 to 65%. The left ventricle has normal function. The left ventricle has no regional wall motion abnormalities. The left ventricular internal cavity size was normal in size. There is  mild left ventricular hypertrophy. Left ventricular diastolic parameters are indeterminate. Right Ventricle: The right ventricular size is normal. Right vetricular wall thickness was not assessed. Right ventricular systolic function is normal. Left Atrium: Left atrial size was normal in size. Right Atrium: Right atrial size was normal in size. Pericardium: There is no evidence of pericardial effusion. Mitral Valve: There is mild thickening of the mitral valve leaflet(s). There is mild calcification of the mitral valve leaflet(s). Trivial mitral  valve regurgitation. Tricuspid Valve: The tricuspid valve is normal in structure. Tricuspid valve regurgitation is trivial. Aortic Valve: The aortic valve is tricuspid. Aortic valve regurgitation is not visualized. Aortic valve sclerosis is present, with no evidence of aortic valve stenosis. Aortic valve mean gradient measures 4.0 mmHg. Aortic valve peak gradient measures 6.6  mmHg. Aortic valve area, by VTI measures 2.65 cm. Pulmonic Valve: The pulmonic valve was normal in structure. Pulmonic valve regurgitation is not visualized. Aorta: The aortic root and ascending aorta are structurally normal, with no evidence of dilitation. Venous: The inferior vena cava is normal in size with greater than  50% respiratory variability, suggesting right atrial pressure of 3 mmHg. IAS/Shunts: No atrial level shunt detected by color flow Doppler.  LEFT VENTRICLE PLAX 2D LVIDd:         3.80 cm   Diastology LVIDs:         2.50 cm   LV e' medial:    6.96 cm/s LV PW:         0.70 cm   LV E/e' medial:  10.4 LV IVS:        1.30 cm   LV e' lateral:   9.25 cm/s LVOT diam:     2.00 cm   LV E/e' lateral: 7.8 LV SV:         70 LV SV Index:   34 LVOT Area:     3.14 cm  RIGHT VENTRICLE             IVC RV Basal diam:  2.30 cm     IVC diam: 1.40 cm RV Mid diam:    2.50 cm RV S prime:     18.70 cm/s TAPSE (M-mode): 2.4 cm LEFT ATRIUM             Index        RIGHT ATRIUM           Index LA diam:        3.00 cm 1.48 cm/m   RA Area:     11.40 cm LA Vol (A2C):   33.4 ml 16.45 ml/m  RA Volume:   19.60 ml  9.65 ml/m LA Vol (A4C):   39.3 ml 19.35 ml/m LA Biplane Vol: 36.5 ml 17.97 ml/m  AORTIC VALVE                    PULMONIC VALVE AV Area (Vmax):    2.80 cm     PV Vmax:       0.90 m/s AV Area (Vmean):   2.43 cm     PV Peak grad:  3.2 mmHg AV Area (VTI):     2.65 cm AV Vmax:           128.00 cm/s AV Vmean:          93.800 cm/s AV VTI:            0.264 m AV Peak Grad:      6.6 mmHg AV Mean Grad:      4.0 mmHg LVOT Vmax:         114.00 cm/s LVOT Vmean:        72.600 cm/s LVOT VTI:          0.223 m LVOT/AV VTI ratio: 0.84  AORTA Ao Root diam: 3.40 cm Ao Asc diam:  3.80 cm MITRAL VALVE                TRICUSPID VALVE MV Area (PHT): 7.22 cm  TR Peak grad:   26.0 mmHg MV Decel Time: 105 msec     TR Vmax:        255.00 cm/s MV E velocity: 72.40 cm/s MV A velocity: 114.00 cm/s  SHUNTS MV E/A ratio:  0.64         Systemic VTI:  0.22 m                             Systemic Diam: 2.00 cm Dorris Carnes MD Electronically signed by Dorris Carnes MD Signature Date/Time: 01/07/2022/11:57:51 AM    Final    MR BRAIN WO CONTRAST  Result Date: 01/07/2022 CLINICAL DATA:  Follow-up examination for acute stroke. EXAM: MRI HEAD WITHOUT CONTRAST TECHNIQUE:  Multiplanar, multiecho pulse sequences of the brain and surrounding structures were obtained without intravenous contrast. COMPARISON:  Prior CTs from earlier the same day. FINDINGS: Brain: Cerebral volume within normal limits. Scattered patchy T2/FLAIR hyperintensity involving the periventricular and deep white matter both cerebral hemispheres, most consistent with chronic small vessel ischemic disease, mild to moderate in nature. 1.2 cm acute ischemic nonhemorrhagic infarcts seen involving the right thalamocapsular region (series 5, image 74). No significant mass effect. No other evidence for acute or subacute ischemia. Gray-white matter differentiation otherwise maintained. No areas of chronic cortical infarction. No acute or chronic intracranial blood products. No mass lesion, mass effect or midline shift. No hydrocephalus or extra-axial fluid collection. Pituitary gland suprasellar region within normal limits. Midline structures intact and normally formed. Vascular: Major intracranial vascular flow voids are maintained. Skull and upper cervical spine: Craniocervical junction within normal limits. Bone marrow signal intensity normal. Partial segmental fusion of the C3 and C4 vertebral bodies noted. No scalp soft tissue abnormality. Sinuses/Orbits: Prior bilateral ocular lens replacement. Paranasal sinuses are largely clear. No mastoid effusion. Other: None. IMPRESSION: 1. 1.2 cm acute ischemic nonhemorrhagic right thalamocapsular infarct. 2. Underlying mild to moderate chronic microvascular ischemic disease. Electronically Signed   By: Jeannine Boga M.D.   On: 01/07/2022 00:20   CT HEAD CODE STROKE WO CONTRAST  Result Date: 01/06/2022 CLINICAL DATA:  Neuro deficit, acute, stroke suspected EXAM: CT HEAD WITHOUT CONTRAST TECHNIQUE: Contiguous axial images were obtained from the base of the skull through the vertex without intravenous contrast. RADIATION DOSE REDUCTION: This exam was performed according  to the departmental dose-optimization program which includes automated exposure control, adjustment of the mA and/or kV according to patient size and/or use of iterative reconstruction technique. COMPARISON:  04/10/2021 FINDINGS: Brain: There is no acute intracranial hemorrhage, mass effect, or edema. No new loss of gray-white differentiation. Ventricles and sulci are stable in size and configuration. Patchy hypoattenuation in the supratentorial white matter is nonspecific but may reflect similar mild chronic microvascular ischemic changes. There is no extra-axial collection. Vascular: No hyperdense vessel. Skull: Unremarkable. Sinuses/Orbits: No acute finding. Other: Mastoid air cells are clear. ASPECTS ([place] Stroke Program Early CT Score) - Ganglionic level infarction (caudate, lentiform nuclei, internal capsule, insula, M1-M3 cortex): 7 - Supraganglionic infarction (M4-M6 cortex): 3 Total score (0-10 with 10 being normal): 10 IMPRESSION: There is no acute intracranial hemorrhage or evidence of acute infarction. Mild chronic microvascular ischemic changes. Electronically Signed   By: Macy Mis M.D.   On: 01/06/2022 18:10     PHYSICAL EXAM  Temp:  [98 F (36.7 C)-98.7 F (37.1 C)] 98 F (36.7 C) (06/21 1120) Pulse Rate:  [74-100] 74 (06/21 1120) Resp:  [17-18] 18 (06/21 0344) BP: (123-153)/(68-82)  133/70 (06/21 1120) SpO2:  [92 %-98 %] 96 % (06/21 1120)  General - Well nourished, well developed, in no apparent distress.  Ophthalmologic - fundi not visualized due to noncooperation.  Cardiovascular - Regular rhythm and rate.  Neuro - awake and alert sitting up in bed in NAD. She is alert and oriented x4, mild dysarthria, mild left facial droop. Tracks, EOMI, visual fields full to finger count. Left arm and left lower 4/5, with no drift decreased  fine motor skills on left hand right upper and lower 5/5. Decreased sensation on left face, arm and lower. gait not tested.   NIHSS 2 premorbid  modified Rankin score 0  ASSESSMENT/PLAN Ms. CLYDEAN POSAS is a 66 y.o. female with history of hypertension, hyperlipidemia, diabetes, obesity, Bell's palsy, and stroke in 03/2021 admitted for left-sided numbness and weakness.  Status post TNK.  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 Ongoing aggressive stroke risk factor management Therapy recommendations: CIR. Awaiting insurance approval Disposition: Pending  History of stroke 03/2021 MRI showed left thalamic infarct.  CTA head and neck right PCA occlusion, left P2 moderate stenosis, left P3 occlusion.  LDL 52, A1c 6.4.  Discharged on DAPT for 3 weeks and then aspirin alone.  Also discharged on Zocor 40.  Diabetes HgbA1c 7.1 goal < 7.0 Uncontrolled CBG monitoring SSI DM education and close PCP follow up Dietitian consult for DM diet education  Hypertension Stable BP goal less than 180/105  On home lasix 20 Long term BP goal normotensive  Hyperlipidemia Home meds: Zocor 40 LDL 82, goal < 70 Now on Crestor 20 Continue statin at discharge  Other Stroke Risk Factors Advanced age Obesity, Body mass index is 41.98 kg/m.   Other Active Problems History of Bell's palsy Melanoma in 2009 Leukcytosis - WBC 12.6, will monitor - afebrile   Hospital day # 8   No family at bedside.  Patient is showing slow improvement.  Continue current management.  Transfer to inpatient rehab later today after bed approval.  She was randomized to the CPAP  treatment arm of the sleep smart study.  She is medically stable to be transferred to inpatient rehab when bed becomes available.   Discussed with rehab coordinator Antony Contras, MD  Stroke Neurology 01/14/2022 1:29 PM     To contact Stroke Continuity provider, please refer to [email or web address] After hours, contact General Neurology

## 2022-01-14 NOTE — Progress Notes (Signed)
Inpatient Rehabilitation Admission Medication Review by a Pharmacist  A complete drug regimen review was completed for this patient to identify any potential clinically significant medication issues.  High Risk Drug Classes Is patient taking? Indication by Medication  Antipsychotic Yes Compazine- N/V  Anticoagulant Yes Lovenox- VTE prophylaxis  Antibiotic No   Opioid Yes Tramadol- acute pain  Antiplatelet Yes Aspirin, plavix- CVA prophylaxis X21 days (6/15 through 7/5) f/b plavix alone starting 01/29/2022  Hypoglycemics/insulin Yes Metformin- T2DM  Vasoactive Medication Yes Lasix, lisinopril- hypertension  Chemotherapy No   Other Yes Buspar- anxiety Crestor- HLD Protonix- GERD Trazodone- sleep Gabapentin- neuropathic pain     Type of Medication Issue Identified Description of Issue Recommendation(s)  Drug Interaction(s) (clinically significant)     Duplicate Therapy     Allergy     No Medication Administration End Date     Incorrect Dose     Additional Drug Therapy Needed     Significant med changes from prior encounter (inform family/care partners about these prior to discharge).    Other  PTA meds: Mobic 15 mg qday Januvia 100 mg qday Zocor 40 mg qhs Restart PTA meds when and if necessary during CIR admission or at time of discharge, if warranted  Zocor- has been changed to Crestor    Clinically significant medication issues were identified that warrant physician communication and completion of prescribed/recommended actions by midnight of the next day:  No  Time spent performing this drug regimen review (minutes):  30   Blaklee Shores BS, PharmD, BCPS Clinical Pharmacist 01/14/2022 9:57 PM  Contact: [phone] after 3 PM  "Be curious, not judgmental..." -Jamal Maes

## 2022-01-14 NOTE — Progress Notes (Signed)
PMR Admission Coordinator Pre-Admission Assessment   Patient: Shelby Morgan is an 66 y.o., female MRN: [id number] DOB: [birth year] Height: 5'2" Weight: 104.1 kg   Insurance Information HMO:     PPO: Yes     PCP:       IPA:       80/20:       OTHER:  Group [id number] [place] PRIMARY: Bernadene Person      Policy#: [id number]      Subscriber: self CM Name: Marcene Brawn       Phone#: [phone]     Fax#: [phone]  Deneise Lever at [place] called with approval on 6/20 for 9 days. 6/206/28 with updates due 1/02 Pre-Cert#: [id number]      Employer: Retired Benefits:  Phone #: [phone]     Name: [email or web address] portal on line Eff. Date: 11/24/21     Deduct: $0      Out of Pocket Max: $4500 (met $0)      Life Max: N/A CIR: $295 days 1-6 with max $1770/admission      SNF: $0 days 1-20; $196 days 21-100 Outpatient: med nec     Co-Pay: $35/visit Home Health: 100%      Co-Pay: none DME: 80%     Co-Pay: 20% Providers: in network  SECONDARY:       Policy#:      Phone#:    Development worker, community:       Phone#:    The Engineer, petroleum" for patients in Inpatient Rehabilitation Facilities with attached "Privacy Act [place] Records" was provided and verbally reviewed with: Patient   Emergency Contact Information Contact Information       Name Relation Home Work [place]. Son     [phone]    Su Monks     [phone]    Tate,Betty Mother [phone]   [phone]           Current Medical History  Patient Admitting Diagnosis: R CVA   History of Present Illness: Pt is a 65 y/o female admitted 6/13 with Left sided numbness and weakness.  MRI shows a 1.2 cm acute right thalamocapsular infarct--non hemorrhagic.Marland Kitchen  PMHx Bells palsy, HLD, DM, HTN, melanoma, Left thalamic stroke 2022, NAFLD   Complete NIHSS TOTAL: 3   Patient's medical record from [facility] health has been reviewed by the rehabilitation admission coordinator and physician.   Past Medical History      Past  Medical History:  Diagnosis Date   Anxiety     Bell's palsy 01/08/2011   Chronic back pain     Depression     Diabetes mellitus     Diverticulosis     GERD (gastroesophageal reflux disease)     Hemorrhoids     Hepatomegaly     Hypertension     Lumbar stenosis      L2/L3--followed by Dr. Ernestina Patches   Melanoma [facility]) 2009    Dr Nevada Crane, Stage 2, required only surgery   NAFLD (nonalcoholic fatty liver disease)     Skin cancer     Stroke ([place]) 07/2021      Has the patient had major surgery during 100 days prior to admission? No   Family History   family history includes Bladder Cancer in her father; COPD in her father; Heart disease in her maternal grandfather, maternal grandmother, and mother.   Current Medications   Current Facility-Administered Medications:    acetaminophen (TYLENOL) tablet 650 mg, 650 mg, Oral, Q4H PRN, 650 mg at 01/14/22  0400 **OR** acetaminophen (TYLENOL) 160 MG/5ML solution 650 mg, 650 mg, Per Tube, Q4H PRN **OR** acetaminophen (TYLENOL) suppository 650 mg, 650 mg, Rectal, Q4H PRN, Caryl Pina, MD   aspirin EC tablet 81 mg, 81 mg, Oral, Daily, Reome, Earle J, RPH, 81 mg at 01/14/22 0842   busPIRone (BUSPAR) tablet 5 mg, 5 mg, Oral, BID, Caryl Pina, MD, 5 mg at 01/14/22 8229   clopidogrel (PLAVIX) tablet 75 mg, 75 mg, Oral, Daily, Marvel Plan, MD, 75 mg at 01/14/22 0841   enoxaparin (LOVENOX) injection 40 mg, 40 mg, Subcutaneous, Q24H, Marvel Plan, MD, 40 mg at 01/14/22 1451   furosemide (LASIX) tablet 20 mg, 20 mg, Oral, q morning, Caryl Pina, MD, 20 mg at 01/14/22 0841   insulin aspart (novoLOG) injection 0-20 Units, 0-20 Units, Subcutaneous, TID WC, Caryl Pina, MD, 7 Units at 01/14/22 1143   labetalol (NORMODYNE) injection 10-20 mg, 10-20 mg, Intravenous, Q2H PRN, Marvel Plan, MD   lisinopril (ZESTRIL) tablet 20 mg, 20 mg, Oral, Daily, Marvel Plan, MD, 20 mg at 01/14/22 0842   pantoprazole (PROTONIX) EC tablet 40 mg, 40 mg, Oral, Daily, Marvel Plan, MD,  40 mg at 01/14/22 0841   rosuvastatin (CRESTOR) tablet 20 mg, 20 mg, Oral, Daily, Marvel Plan, MD, 20 mg at 01/14/22 0841   senna-docusate (Senokot-S) tablet 1 tablet, 1 tablet, Oral, QHS PRN, Caryl Pina, MD   traZODone (DESYREL) tablet 100 mg, 100 mg, Oral, QHS PRN, Caryl Pina, MD, 100 mg at 01/13/22 2143   Patients Current Diet:  Diet Order                  Diet heart healthy/carb modified Room service appropriate? No; Fluid consistency: Thin  Diet effective now                         Precautions / Restrictions Precautions Precautions: Fall Precaution Comments: BP parameters 180/100 Restrictions Weight Bearing Restrictions: No LLE Weight Bearing: Weight bearing as tolerated    Has the patient had 2 or more falls or a fall with injury in the past year? No   Prior Activity Level Limited Community (1-2x/wk): Micah Flesher out a couple times a week.  Was driving.   Prior Functional Level Self Care: Did the patient need help bathing, dressing, using the toilet or eating? Independent   Indoor Mobility: Did the patient need assistance with walking from room to room (with or without device)? Independent   Stairs: Did the patient need assistance with internal or external stairs (with or without device)? Independent   Functional Cognition: Did the patient need help planning regular tasks such as shopping or remembering to take medications? Independent   Patient Information Are you of Hispanic, Latino/a,or Spanish origin?: A. No, not of Hispanic, Latino/a, or Spanish origin What is your race?: A. White Do you need or want an interpreter to communicate with a doctor or health care staff?: 0. No   Patient's Response To:  Health Literacy and Transportation Is the patient able to respond to health literacy and transportation needs?: Yes Health Literacy - How often do you need to have someone help you when you read instructions, pamphlets, or other written material from your doctor or  pharmacy?: Never In the past 12 months, has lack of transportation kept you from medical appointments or from getting medications?: No In the past 12 months, has lack of transportation kept you from meetings, work, or from getting things needed for daily living?: No  Home Assistive Devices / Equipment Home Assistive Devices/Equipment: None Home Equipment: Grab bars - tub/shower, Conservation officer, nature (2 wheels), [employer] - single point   Prior Device Use: Indicate devices/aids used by the patient prior to current illness, exacerbation or injury? None of the above   Current Functional Level Cognition   Arousal/Alertness: Awake/alert Overall Cognitive Status: Within Functional Limits for tasks assessed Current Attention Level: Sustained Orientation Level: Oriented X4 Following Commands: Follows multi-step commands with increased time Safety/Judgement: Decreased awareness of safety General Comments: easily distracted at times and required cues for attention to task Attention: Focused, Sustained Focused Attention: Appears intact Sustained Attention: Appears intact Memory: Impaired Memory Impairment:  (Immediate: 5/5; delayed: 5/5; paragraph: 6/8) Awareness: Appears intact Problem Solving: Impaired Problem Solving Impairment: Verbal complex (Money: 1/3; time: 1/1) Executive Function: Sequencing, Technical brewer: Impaired Sequencing Impairment: Verbal complex (clock drawing: 2/4 with self-correction) Organizing: Impaired Organizing Impairment: Verbal complex (backward digit span: 1/2)    Extremity Assessment (includes Sensation/Coordination)   Upper Extremity Assessment: Defer to OT evaluation RUE Deficits / Details: hx of CVA with weakness 3/5 MMT, slow movements but coordiated RUE Sensation: decreased light touch RUE Coordination: WNL LUE Deficits / Details: grosly 3-/5 MMT, poor coordination, proprioception and no sensation. pt using functionally during ADL tasks, but pt unable to  control UE at times mobility tasks LUE Sensation: decreased light touch, decreased proprioception LUE Coordination: decreased fine motor, decreased gross motor  Lower Extremity Assessment: Defer to PT evaluation RLE Deficits / Details: functional, but mild weakness, pt able to isolate movements LLE Deficits / Details: grossly weak at 3/5, uncoodinated with absence of LT/pressure and pain. LLE Sensation: decreased light touch, decreased proprioception LLE Coordination: decreased fine motor, decreased gross motor     ADLs   Overall ADL's : Needs assistance/impaired Eating/Feeding: Minimal assistance Eating/Feeding Details (indicate cue type and reason): pt requires cueing to utilize L hand to hold cup, educated on guiding cup with R hand. Grooming: Set up, Sitting Grooming Details (indicate cue type and reason): increased time for fine motor aspects of task Upper Body Dressing : Minimal assistance, Sitting Upper Body Dressing Details (indicate cue type and reason): to don gown Lower Body Dressing: Moderate assistance, Sitting/lateral leans Lower Body Dressing Details (indicate cue type and reason): to don socks EOB Toilet Transfer: Moderate assistance Toilet Transfer Details (indicate cue type and reason): with use of Doctor, hospital and Hygiene: Total assistance, Sit to/from stand Functional mobility during ADLs: Moderate assistance General ADL Comments: fatigues quickly     Mobility   Overal bed mobility: Needs Assistance Bed Mobility: Supine to Sit Rolling: Min assist Sidelying to sit: Mod assist, +2 for safety/equipment, Min assist Supine to sit: Min assist Sit to supine: Mod assist General bed mobility comments: Pt was received sitting up in the recliner.     Transfers   Overall transfer level: Needs assistance Equipment used: Rolling walker (2 wheels) Transfers: Sit to/from Stand Sit to Stand: Mod assist Bed to/from chair/wheelchair/BSC transfer  type:: Via Lift equipment Transfer via Lift Equipment: [place] transfer comment: VC's for hand placement on seated surface for safety. Heavy mod assist initially, progressing to lighter mod assist with repetition of sit<>stand     Ambulation / Gait / Stairs / Wheelchair Mobility   Ambulation/Gait Ambulation/Gait assistance: Mod assist, Max assist, +2 safety/equipment Gait Distance (Feet): 15 Feet Assistive device: Rolling walker (2 wheels) Gait Pattern/deviations: Step-to pattern, Step-through pattern, Decreased step length - right, Decreased stride length, Knees buckling General Gait Details:  Pt was able to advance LE's with increased control this session, however assist provided for LUE to maintain positioning on the RW. Assist also provided for L lateral lean in standing, bracing pt's elbow for support. Close chair follow required. Gait velocity: Decreased Gait velocity interpretation: <1.31 ft/sec, indicative of household ambulator Pre-gait activities: left knee buckling noted x 1 bout initially after standing. weight shifting performed to right and left to assess readiness for ambulation with no knee buckling further.     Posture / Balance Dynamic Sitting Balance Sitting balance - Comments: in lieu of donning socks, worked on symmetrical and asymetrical scooting. Balance Overall balance assessment: Needs assistance Sitting-balance support: No upper extremity supported, Feet supported Sitting balance-Leahy Scale: Fair Sitting balance - Comments: in lieu of donning socks, worked on symmetrical and asymetrical scooting. Standing balance support: Bilateral upper extremity supported, During functional activity Standing balance-Leahy Scale: Poor Standing balance comment: reliant on external support and or AD     Special needs/care consideration Diabetic management Yes h/o DM    Previous Home Environment (from acute therapy documentation) Living Arrangements: Alone  Lives With:  Alone Available Help at Discharge: Family Type of Home: House Home Layout: One level Home Access: Stairs to enter Entrance Stairs-Rails: Can reach both Entrance Stairs-Number of Steps: 7 Bathroom Shower/Tub: Chiropodist: [place]: No   Discharge Living Setting Plans for Discharge Living Setting: Patient's home, House, Alone (Lives alone.  Son lives out of town.) Type of Home at Discharge: House Discharge Home Layout: One level Discharge Home Access: Stairs to enter Entrance Stairs-Rails: Right, Left Entrance Stairs-Number of Steps: 4 at the front and 7 at the back entry. Discharge Bathroom Shower/Tub: Tub/shower unit, Curtain Discharge Bathroom Toilet: Standard Discharge Bathroom Accessibility: Yes How Accessible: Accessible via walker Does the patient have any problems obtaining your medications?: No   Social/Family/Support Systems Patient Roles: Parent (Has son who lives out of town.) Contact Information: Ferrel Logan - mother - [phone] Anticipated Caregiver: Family working on who can provide care Ability/Limitations of Caregiver: Son lives out of town.  Mother is elderly but does drive. Caregiver Availability: Other (Comment) (Patient and son aware of 24/7 supervision needed after D/C) Discharge Plan Discussed with Primary Caregiver: Yes Is Caregiver In Agreement with Plan?: Yes Does Caregiver/Family have Issues with Lodging/Transportation while Pt is in Rehab?: No   Goals Patient/Family Goal for Rehab: PT/OT mod I and supervision goals Expected length of stay: 10-12 days Pt/Family Agrees to Admission and willing to participate: Yes Program Orientation Provided & Reviewed with Pt/Caregiver Including Roles  & Responsibilities: Yes   Decrease burden of Care through IP rehab admission: N/A   Possible need for SNF placement upon discharge: Not anticipated   Patient Condition: I have reviewed medical records from [facility] health, spoken  with CSW, and patient, son, and family member. I met with patient at the bedside for inpatient rehabilitation assessment.  Patient will benefit from ongoing PT, OT, and SLP, can actively participate in 3 hours of therapy a day 5 days of the week, and can make measurable gains during the admission.  Patient will also benefit from the coordinated team approach during an Inpatient Acute Rehabilitation admission.  The patient will receive intensive therapy as well as Rehabilitation physician, nursing, social worker, and care management interventions.  Due to bladder management, bowel management, safety, skin/wound care, disease management, medication administration, pain management, and patient education the patient requires 24 hour a day rehabilitation nursing.  The patient is  currently min A-mod A with mobility and basic ADLs.  Discharge setting and therapy post discharge at home with home health is anticipated.  Patient has agreed to participate in the Acute Inpatient Rehabilitation Program and will admit today.   Preadmission Screen Completed By:  Genella Mech, 01/14/2022 1:18 PM ______________________________________________________________________   Discussed status with Dr. Curlene Dolphin on 01/14/22 at 49 and received approval for admission today.   Admission Coordinator:  Genella Mech, CCC-SLP, time 1318/Date 01/14/22 with updates by Clemens Catholic, MS, CCC-SLP     Assessment/Plan: Diagnosis:  right thalamocapsular infarct Does the need for close, 24 hr/day Medical supervision in concert with the patient's rehab needs make it unreasonable for this patient to be served in a less intensive setting? Yes Co-Morbidities requiring supervision/potential complications: Bells palsy, HLD, DM, HTN, melanoma, Left thalamic stroke 2022, NAFLD Due to bladder management, bowel management, safety, skin/wound care, disease management, medication administration, pain management, and patient education, does the patient  require 24 hr/day rehab nursing? Yes Does the patient require coordinated care of a physician, rehab nurse, PT, OT, and SLP to address physical and functional deficits in the context of the above medical diagnosis(es)? Yes Addressing deficits in the following areas: balance, endurance, locomotion, strength, transferring, bowel/bladder control, bathing, dressing, feeding, grooming, toileting, cognition, speech, language, swallowing, and psychosocial support Can the patient actively participate in an intensive therapy program of at least 3 hrs of therapy 5 days a week? Yes The potential for patient to make measurable gains while on inpatient rehab is excellent Anticipated functional outcomes upon discharge from inpatient rehab: modified independent and supervision PT, modified independent and supervision OT, n/a SLP Estimated rehab length of stay to reach the above functional goals is: 10-12           Anticipated discharge destination: Home 10. Overall Rehab/Functional Prognosis: excellent     MD Signature: Jennye Boroughs

## 2022-01-14 NOTE — Progress Notes (Signed)
CODE STROKE 1745 call time 1750 beeper 1802 exam started 1805 exam finished  1805 images sent to [place] exam completed 1806 [facility] called

## 2022-01-14 NOTE — Progress Notes (Signed)
Inpatient Rehab Admissions Coordinator:   I do not have a bed for this Pt on CIR this AM; however, may be able to offer a bed this afternoon if a bed becomes available. I will update the team once I know for sure if we have a bed.   Clemens Catholic, [place], [place] Admissions Coordinator  [phone] ([place]) [phone] (office)

## 2022-01-14 NOTE — Discharge Summary (Addendum)
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 Principal Problem:   Stroke (cerebrum) ([place]) Left hemiparesis Left sensory loss Diabetes Obesity Hyperlipidemia Obstructive sleep apnea patient participating in the sleep smart study stroke prevention study and randomized to CPAP treatment  Medications to be continued on Rehab Allergies as of 01/14/2022       Reactions   Codeine Nausea And Vomiting        Medication List     STOP taking these medications    simvastatin 40 MG tablet Commonly known as: ZOCOR       TAKE these medications    acetaminophen 325 MG tablet Commonly known as: TYLENOL Take 650 mg by mouth every 6 (six) hours as needed for moderate pain or mild pain.   aspirin EC 81 MG tablet Take 1 tablet (81 mg total) by mouth daily. Swallow whole.   busPIRone 5 MG tablet Commonly known as: BUSPAR Take 5 mg by mouth 2 (two) times daily.   Centrum Silver 50+Women Tabs Take 1 tablet by mouth daily.   clopidogrel 75 MG tablet Commonly known as: PLAVIX Take 1 tablet (75 mg total) by mouth daily. Start taking on: January 15, 2022   furosemide 20 MG tablet Commonly known as: LASIX Take 20 mg by mouth every morning.   lisinopril 20 MG tablet Commonly known as: ZESTRIL Take 20 mg by mouth daily.   MAGNESIUM PO Take 1 tablet by mouth at bedtime. Unsure of dose   meloxicam 15 MG tablet Commonly known as: MOBIC Take 15 mg by mouth daily.   metFORMIN 500 MG 24 hr tablet Commonly known as: GLUCOPHAGE-XR Take 1,000 mg by mouth 2 (two) times daily.   naproxen sodium 220 MG tablet Commonly known as: ALEVE Take 220 mg by mouth daily.   pantoprazole 40 MG  tablet Commonly known as: PROTONIX TAKE ONE TABLET BY MOUTH ONCE DAILY What changed: when to take this   Potassium 99 MG Tabs Take 99 mg by mouth daily.   rosuvastatin 20 MG tablet Commonly known as: CRESTOR Take 1 tablet (20 mg total) by mouth daily. Start taking on: January 15, 2022   sitaGLIPtin 100 MG tablet Commonly known as: JANUVIA Take 100 mg by mouth daily at 4 PM.   traMADol 50 MG tablet Commonly known as: ULTRAM Take 50 mg by mouth every 4 (four) hours as needed for moderate pain.   traZODone 100 MG tablet Commonly known as: DESYREL Take 100 mg by mouth at bedtime as needed for sleep.        LABORATORY STUDIES CBC    Component Value Date/Time   WBC 10.7 (H) 01/12/2022 0343   RBC 4.37 01/12/2022 0343   HGB 12.2 01/12/2022 0343   HCT 36.9 01/12/2022 0343   PLT 332 01/12/2022 0343   MCV 84.4 01/12/2022 0343   MCH 27.9 01/12/2022 0343   MCHC 33.1 01/12/2022 0343   RDW 13.6 01/12/2022 0343   LYMPHSABS 2.0 01/06/2022 1821   MONOABS 0.7 01/06/2022 1821   EOSABS 0.3 01/06/2022 1821   BASOSABS 0.1 01/06/2022 1821   CMP    Component Value Date/Time   NA 135 01/12/2022 0343   K 4.0  01/12/2022 0343   CL 102 01/12/2022 0343   CO2 22 01/12/2022 0343   GLUCOSE 208 (H) 01/12/2022 0343   BUN 23 01/12/2022 0343   CREATININE 1.27 (H) 01/12/2022 0343   CALCIUM 9.0 01/12/2022 0343   PROT 7.7 01/06/2022 1821   PROT 7.0 03/06/2013 0000   ALBUMIN 3.8 01/06/2022 1821   ALBUMIN 3.8 03/06/2013 0000   AST 36 01/06/2022 1821   AST 22 03/06/2013 0000   ALT 38 01/06/2022 1821   ALKPHOS 61 01/06/2022 1821   ALKPHOS 55 03/06/2013 0000   BILITOT 0.7 01/06/2022 1821   BILITOT 0.4 03/06/2013 0000   GFRNONAA 47 (L) 01/12/2022 0343   GFRAA >60 06/06/2018 1530   COAGS Lab Results  Component Value Date   INR 1.0 01/06/2022   INR 1.0 04/10/2021   INR 1.09 05/01/2012   Lipid Panel    Component Value Date/Time   CHOL 156 01/07/2022 0418   TRIG 107 01/07/2022 0418    HDL 53 01/07/2022 0418   CHOLHDL 2.9 01/07/2022 0418   VLDL 21 01/07/2022 0418   LDLCALC 82 01/07/2022 0418   HgbA1C  Lab Results  Component Value Date   HGBA1C 7.1 (H) 01/07/2022   Urinalysis    Component Value Date/Time   COLORURINE YELLOW 01/06/2022 1927   APPEARANCEUR CLEAR 01/06/2022 1927   LABSPEC 1.005 01/06/2022 1927   PHURINE 6.0 01/06/2022 1927   GLUCOSEU NEGATIVE 01/06/2022 1927   HGBUR NEGATIVE 01/06/2022 1927   BILIRUBINUR NEGATIVE 01/06/2022 1927   KETONESUR NEGATIVE 01/06/2022 1927   PROTEINUR NEGATIVE 01/06/2022 1927   UROBILINOGEN 0.2 01/08/2011 1649   NITRITE NEGATIVE 01/06/2022 1927   LEUKOCYTESUR NEGATIVE 01/06/2022 1927   Urine Drug Screen     Component Value Date/Time   LABOPIA NONE DETECTED 01/06/2022 1927   COCAINSCRNUR NONE DETECTED 01/06/2022 1927   LABBENZ NONE DETECTED 01/06/2022 1927   AMPHETMU NONE DETECTED 01/06/2022 1927   THCU NONE DETECTED 01/06/2022 1927   LABBARB NONE DETECTED 01/06/2022 1927    Alcohol Level    Component Value Date/Time   ETH <10 01/06/2022 1821     SIGNIFICANT DIAGNOSTIC STUDIES CT ANGIO HEAD NECK W WO CM  Result Date: 01/08/2022 CLINICAL DATA:  Follow-up examination for acute stroke. EXAM: CT ANGIOGRAPHY HEAD AND NECK TECHNIQUE: Multidetector CT imaging of the head and neck was performed using the standard protocol during bolus administration of intravenous contrast. Multiplanar CT image reconstructions and MIPs were obtained to evaluate the vascular anatomy. Carotid stenosis measurements (when applicable) are obtained utilizing NASCET criteria, using the distal internal carotid diameter as the denominator. RADIATION DOSE REDUCTION: This exam was performed according to the departmental dose-optimization program which includes automated exposure control, adjustment of the mA and/or kV according to patient size and/or use of iterative reconstruction technique. CONTRAST:  80m OMNIPAQUE IOHEXOL 350 MG/ML SOLN  COMPARISON:  Prior MRI from 01/06/2022 as well as prior CTA from 04/10/2021 and chest CT from 06/06/2018. FINDINGS: CT HEAD FINDINGS Brain: There has been continued interval evolution of previously identified acute infarct involving the right thalamic capsular region, stable in size as compared to previous MRI. No associated mass effect or evidence for hemorrhagic transformation. No other acute intracranial hemorrhage or large vessel territory infarct. Underlying atrophy with mild chronic small vessel ischemic disease noted. Small remote left thalamic lacunar infarct noted. No mass lesion or midline shift. No hydrocephalus or extra-axial fluid collection. Vascular: No hyperdense vessel. Scattered vascular calcifications noted within the carotid siphons. Skull: Scalp soft tissues and calvarium  within normal limits. Sinuses: Paranasal sinuses are largely clear.  No mastoid effusion. Orbits: Globes orbital soft tissues demonstrate no acute finding. Review of the MIP images confirms the above findings CTA NECK FINDINGS Aortic arch: Visualized aortic arch normal in caliber with standard 3 vessel branching pattern. Mild plaque within the arch itself. No stenosis about the origin of the great vessels. Right carotid system: Right common and internal carotid arteries patent without stenosis or dissection. Mild for age plaque about the right carotid bulb without stenosis. Left carotid system: Left common and internal carotid arteries patent without stenosis or dissection. Mild for age plaque about the left carotid bulb without significant stenosis. Vertebral arteries: Both vertebral arteries arise from the subclavian arteries. No proximal subclavian artery stenosis. Both vertebral arteries widely patent without stenosis, dissection or occlusion. Skeleton: No discrete or worrisome osseous lesions. Segmental fusion of the C3 and C4 vertebral bodies noted. Moderate spondylosis noted at C5-6 and C6-7. Other neck: No other acute  soft tissue abnormality within the neck. 8 mm soft tissue nodule present at the superior aspect left parotid gland, indeterminate. Few additional scattered subcentimeter nodular densities about the bilateral parotid glands favored to reflect intraparotid lymph nodes. 9 mm left thyroid nodule, of doubtful significance given size and patient age, no follow-up imaging recommended (ref: J Am Coll Radiol. 2015 Feb;12(2): 143-50). Upper chest: Visualized upper chest demonstrates no acute finding. Irregular soft tissue density partially visualized at the level of the AP window (series 5, image 161), likely adenopathy as seen on prior studies. This appears somewhat increased in size measuring up to 2.5 cm (previously 1.9 cm on prior chest CT from 06/06/2018. Review of the MIP images confirms the above findings CTA HEAD FINDINGS Anterior circulation: Petrous segments patent bilaterally. Atheromatous change within the carotid siphons without hemodynamically significant stenosis. A1 segments, anterior communicating artery complex common anterior cerebral arteries patent without stenosis. No M1 stenosis or occlusion. No proximal MCA branch occlusion. Distal left MCA branches widely patent. There is a focal severe proximal right M3 stenosis (series 13, image 76). Right MCA branches otherwise patent and well perfused. Posterior circulation: Both V4 segments patent without significant stenosis. Left PICA patent. Right PICA origin not well seen. Basilar patent to its distal aspect without stenosis superior cerebellar arteries patent proximally. Both PCAs primarily supplied via the basilar. Focal moderate proximal left P2 stenosis (series 12, image 117). Additional severe distal left P3 stenoses (series 12, image 137). Left PCA attenuated but patent distally. On the right, there is a focal severe right P2 stenosis (series 12, image 115) right PCA mildly irregular but otherwise patent to its distal aspect. Venous sinuses: Patent  allowing for timing the contrast bolus. Anatomic variants: None significant.  No aneurysm. Review of the MIP images confirms the above findings IMPRESSION: CT HEAD IMPRESSION: 1. Normal expected interval evolution of acute right thalamic infarct, stable in size relative to previous MRI. No evidence for hemorrhagic transformation or other complication. 2. No other new acute intracranial abnormality. 3. Underlying atrophy with chronic small vessel ischemic disease. CTA HEAD AND NECK: 1. Negative CTA for large vessel occlusion. 2. Intracranial atherosclerotic disease with multifocal moderate to severe right M3, bilateral P2, and distal left P3 stenoses as above. No proximal high-grade or correctable stenosis. Overall, appearance is similar as compared to prior CTA from 04/10/2021. 3. Wide patency of the major arterial vasculature within the neck. 4. Mediastinal adenopathy, partially visualized and incompletely assessed on this exam. Further evaluation with dedicated cross-sectional imaging of  the chest suggested for complete evaluation. 5. 8 mm nodular lesion within the superior left parotid gland, indeterminate. While this may reflect an intraparotid lymph node, a possible small primary salivary neoplasm could also have this appearance. Nonemergent outpatient ENT referral for further workup suggested. Electronically Signed   By: Jeannine Boga M.D.   On: 01/08/2022 05:24   ECHOCARDIOGRAM COMPLETE  Result Date: 01/07/2022    ECHOCARDIOGRAM REPORT   Patient Name:   Shelby Morgan Date of Exam: 01/07/2022 Medical Rec #:  [id number]      Height:       62.0 in Accession #:    [id number]     Weight:       230.6 lb Date of Birth:  1955-08-02      BSA:          2.031 m Patient Age:    80 years       BP:           161/93 mmHg Patient Gender: F              HR:           82 bpm. Exam Location:  Inpatient Procedure: 2D Echo, Cardiac Doppler and Color Doppler Indications:    Stroke  History:        Patient has prior  history of Echocardiogram examinations, most                 recent 04/11/2021. Risk Factors:Hypertension, Diabetes and HLD.  Sonographer:    Joette Catching RCS Referring Phys: [place]  1. Left ventricular ejection fraction, by estimation, is 60 to 65%. The left ventricle has normal function. The left ventricle has no regional wall motion abnormalities. There is mild left ventricular hypertrophy. Left ventricular diastolic parameters are indeterminate.  2. Right ventricular systolic function is normal. The right ventricular size is normal.  3. Trivial mitral valve regurgitation.  4. The aortic valve is tricuspid. Aortic valve regurgitation is not visualized. Aortic valve sclerosis is present, with no evidence of aortic valve stenosis.  5. The inferior vena cava is normal in size with greater than 50% respiratory variability, suggesting right atrial pressure of 3 mmHg. Comparison(s): The left ventricular function is unchanged. FINDINGS  Left Ventricle: Left ventricular ejection fraction, by estimation, is 60 to 65%. The left ventricle has normal function. The left ventricle has no regional wall motion abnormalities. The left ventricular internal cavity size was normal in size. There is  mild left ventricular hypertrophy. Left ventricular diastolic parameters are indeterminate. Right Ventricle: The right ventricular size is normal. Right vetricular wall thickness was not assessed. Right ventricular systolic function is normal. Left Atrium: Left atrial size was normal in size. Right Atrium: Right atrial size was normal in size. Pericardium: There is no evidence of pericardial effusion. Mitral Valve: There is mild thickening of the mitral valve leaflet(s). There is mild calcification of the mitral valve leaflet(s). Trivial mitral valve regurgitation. Tricuspid Valve: The tricuspid valve is normal in structure. Tricuspid valve regurgitation is trivial. Aortic Valve: The aortic valve is tricuspid.  Aortic valve regurgitation is not visualized. Aortic valve sclerosis is present, with no evidence of aortic valve stenosis. Aortic valve mean gradient measures 4.0 mmHg. Aortic valve peak gradient measures 6.6  mmHg. Aortic valve area, by VTI measures 2.65 cm. Pulmonic Valve: The pulmonic valve was normal in structure. Pulmonic valve regurgitation is not visualized. Aorta: The aortic root and ascending aorta are structurally normal, with no evidence  of dilitation. Venous: The inferior vena cava is normal in size with greater than 50% respiratory variability, suggesting right atrial pressure of 3 mmHg. IAS/Shunts: No atrial level shunt detected by color flow Doppler.  LEFT VENTRICLE PLAX 2D LVIDd:         3.80 cm   Diastology LVIDs:         2.50 cm   LV e' medial:    6.96 cm/s LV PW:         0.70 cm   LV E/e' medial:  10.4 LV IVS:        1.30 cm   LV e' lateral:   9.25 cm/s LVOT diam:     2.00 cm   LV E/e' lateral: 7.8 LV SV:         70 LV SV Index:   34 LVOT Area:     3.14 cm  RIGHT VENTRICLE             IVC RV Basal diam:  2.30 cm     IVC diam: 1.40 cm RV Mid diam:    2.50 cm RV S prime:     18.70 cm/s TAPSE (M-mode): 2.4 cm LEFT ATRIUM             Index        RIGHT ATRIUM           Index LA diam:        3.00 cm 1.48 cm/m   RA Area:     11.40 cm LA Vol (A2C):   33.4 ml 16.45 ml/m  RA Volume:   19.60 ml  9.65 ml/m LA Vol (A4C):   39.3 ml 19.35 ml/m LA Biplane Vol: 36.5 ml 17.97 ml/m  AORTIC VALVE                    PULMONIC VALVE AV Area (Vmax):    2.80 cm     PV Vmax:       0.90 m/s AV Area (Vmean):   2.43 cm     PV Peak grad:  3.2 mmHg AV Area (VTI):     2.65 cm AV Vmax:           128.00 cm/s AV Vmean:          93.800 cm/s AV VTI:            0.264 m AV Peak Grad:      6.6 mmHg AV Mean Grad:      4.0 mmHg LVOT Vmax:         114.00 cm/s LVOT Vmean:        72.600 cm/s LVOT VTI:          0.223 m LVOT/AV VTI ratio: 0.84  AORTA Ao Root diam: 3.40 cm Ao Asc diam:  3.80 cm MITRAL VALVE                TRICUSPID  VALVE MV Area (PHT): 7.22 cm     TR Peak grad:   26.0 mmHg MV Decel Time: 105 msec     TR Vmax:        255.00 cm/s MV E velocity: 72.40 cm/s MV A velocity: 114.00 cm/s  SHUNTS MV E/A ratio:  0.64         Systemic VTI:  0.22 m                             Systemic Diam: 2.00  cm Dorris Carnes MD Electronically signed by Dorris Carnes MD Signature Date/Time: 01/07/2022/11:57:51 AM    Final    MR BRAIN WO CONTRAST  Result Date: 01/07/2022 CLINICAL DATA:  Follow-up examination for acute stroke. EXAM: MRI HEAD WITHOUT CONTRAST TECHNIQUE: Multiplanar, multiecho pulse sequences of the brain and surrounding structures were obtained without intravenous contrast. COMPARISON:  Prior CTs from earlier the same day. FINDINGS: Brain: Cerebral volume within normal limits. Scattered patchy T2/FLAIR hyperintensity involving the periventricular and deep white matter both cerebral hemispheres, most consistent with chronic small vessel ischemic disease, mild to moderate in nature. 1.2 cm acute ischemic nonhemorrhagic infarcts seen involving the right thalamocapsular region (series 5, image 74). No significant mass effect. No other evidence for acute or subacute ischemia. Gray-white matter differentiation otherwise maintained. No areas of chronic cortical infarction. No acute or chronic intracranial blood products. No mass lesion, mass effect or midline shift. No hydrocephalus or extra-axial fluid collection. Pituitary gland suprasellar region within normal limits. Midline structures intact and normally formed. Vascular: Major intracranial vascular flow voids are maintained. Skull and upper cervical spine: Craniocervical junction within normal limits. Bone marrow signal intensity normal. Partial segmental fusion of the C3 and C4 vertebral bodies noted. No scalp soft tissue abnormality. Sinuses/Orbits: Prior bilateral ocular lens replacement. Paranasal sinuses are largely clear. No mastoid effusion. Other: None. IMPRESSION: 1. 1.2 cm acute  ischemic nonhemorrhagic right thalamocapsular infarct. 2. Underlying mild to moderate chronic microvascular ischemic disease. Electronically Signed   By: Jeannine Boga M.D.   On: 01/07/2022 00:20   CT HEAD CODE STROKE WO CONTRAST  Result Date: 01/06/2022 CLINICAL DATA:  Neuro deficit, acute, stroke suspected EXAM: CT HEAD WITHOUT CONTRAST TECHNIQUE: Contiguous axial images were obtained from the base of the skull through the vertex without intravenous contrast. RADIATION DOSE REDUCTION: This exam was performed according to the departmental dose-optimization program which includes automated exposure control, adjustment of the mA and/or kV according to patient size and/or use of iterative reconstruction technique. COMPARISON:  04/10/2021 FINDINGS: Brain: There is no acute intracranial hemorrhage, mass effect, or edema. No new loss of gray-white differentiation. Ventricles and sulci are stable in size and configuration. Patchy hypoattenuation in the supratentorial white matter is nonspecific but may reflect similar mild chronic microvascular ischemic changes. There is no extra-axial collection. Vascular: No hyperdense vessel. Skull: Unremarkable. Sinuses/Orbits: No acute finding. Other: Mastoid air cells are clear. ASPECTS ([place] Stroke Program Early CT Score) - Ganglionic level infarction (caudate, lentiform nuclei, internal capsule, insula, M1-M3 cortex): 7 - Supraganglionic infarction (M4-M6 cortex): 3 Total score (0-10 with 10 being normal): 10 IMPRESSION: There is no acute intracranial hemorrhage or evidence of acute infarction. Mild chronic microvascular ischemic changes. Electronically Signed   By: Macy Mis M.D.   On: 01/06/2022 18:10       HISTORY OF PRESENT ILLNESS Patient with a history of HTN, HLD, DM, obesity, stroke and Bell's palsy presented with acute onset left sided weakness and numbness.   HOSPITAL COURSE TNK was given to treat patient's stroke, and she was admitted to the  ICU.  She was found to have a right thalamic infarct on MRI.  She has residual left-sided weakness and is now ready to be discharged to CIR.  Stroke:  right thalamus infarct, secondary due to small vessel disease   CT head no acute abnormality CT head and neck Intracranial atherosclerotic disease with multifocal moderate to severe right M3, bilateral P2, and distal left P3 stenoses, similar to 03/2021 MRI right thalamic infarct 2D  Echo EF 60 to 65% LDL 82 HgbA1c 7.1 UDS negative lovenox for VTE prophylaxis Aspirin 81 prior to admission, now on ASA and plavix DAPT for 3 weeks and the plavix alone Patient counseled to be compliant with her antithrombotic medications. Ongoing aggressive stroke risk factor management   History of stroke 03/2021 MRI showed left thalamic infarct.  CTA head and neck right PCA occlusion, left P2 moderate stenosis, left P3 occlusion.  LDL 52, A1c 6.4.  Discharged on DAPT for 3 weeks and then aspirin alone.  Also discharged on Zocor 40.   Diabetes HgbA1c 7.1 goal < 7.0 Uncontrolled CBG monitoring SSI DM education and close PCP follow up Dietitian consult for DM diet education   Hypertension Stable BP goal less than 180/105  On home lasix 20 Long term BP goal normotensive   Hyperlipidemia Home meds: Zocor 40 LDL 82, goal < 70 Now on Crestor 20 Continue statin at discharge   Other Stroke Risk Factors Advanced age Obesity, Body mass index is 41.98 kg/m.   DISCHARGE EXAM Blood pressure 133/70, pulse 74, temperature 98 F (36.7 C), temperature source Oral, resp. rate 18, height '5\' 2"'$  (1.575 m), weight 104.1 kg, SpO2 96 %.  General - Well nourished, well developed, in no apparent distress.   Ophthalmologic - fundi not visualized due to noncooperation.   Cardiovascular - Regular rhythm and rate.   Neuro - awake and alert sitting up in bed in NAD. She is alert and oriented x4, mild dysarthria, mild left facial droop. Tracks, EOMI, visual fields full  to finger count. Left arm and left lower 4/5, with no drift decreased  fine motor skills on left hand right upper and lower 5/5. Decreased sensation on left face, arm and lower. gait not tested.   Discharge Diet      Diet   Diet heart healthy/carb modified Room service appropriate? No; Fluid consistency: Thin   liquids  DISCHARGE PLAN Disposition:  Transfer to [facility] for ongoing PT, OT and ST aspirin 81 mg daily and clopidogrel 75 mg daily for secondary stroke prevention for 3 weeks then clopidogrel alone. Recommend ongoing stroke risk factor control by Primary Care Physician at time of discharge from inpatient rehabilitation. Follow-up PCP Cory Munch, PA-C in 2 weeks following discharge from rehab. Continue participation in the sleep smart study and use CPAP every night for sleep apnea Follow-up in [facility] in 8 weeks following discharge from rehab, office to schedule an appointment.   35 minutes were spent preparing discharge.  [place] , MSN, AGACNP-BC [facility] See Amion for schedule and pager information 01/14/2022 2:10 PM   I have personally obtained history,examined this patient, reviewed notes, independently viewed imaging studies, participated in medical decision making and plan of care.ROS completed by me personally and pertinent positives fully documented  I have made any additions or clarifications directly to the above note. Agree with note above.    Antony Contras, MD Medical Director [facility] Pager: [phone] 01/14/2022 2:47 PM

## 2022-01-14 NOTE — TOC Transition Note (Signed)
Transition of Care [facility]) - CM/SW Discharge Note   Patient Details  Name: Shelby Morgan MRN: [id number] Date of Birth: 05/18/1956  [facility] CM/SW Contact:  Pollie Friar, RN Phone Number: 01/14/2022, 1:53 PM   Clinical Narrative:    Patient is discharging to [facility] today. CM signing off.   Final next level of care: IP Rehab Facility Barriers to Discharge: No Barriers Identified   Patient Goals and CMS Choice     Choice offered to / list presented to : Patient  Discharge Placement                       Discharge Plan and Services                                     Social Determinants of Health (SDOH) Interventions     Readmission Risk Interventions     No data to display

## 2022-01-14 NOTE — Progress Notes (Signed)
Patient transferred to CIR

## 2022-01-14 NOTE — Progress Notes (Signed)
Inpatient Rehab Admissions Coordinator:  ? ?I have a bed for this Pt. On CIR today. RN may call report to [phone]. ? ?Dezire Turk, MS, CCC-SLP ?Rehab Admissions Coordinator  ?[phone] (celll) ?[phone] (office) ?

## 2022-01-15 LAB — COMPREHENSIVE METABOLIC PANEL
ALT: 26 U/L (ref 0–44)
AST: 20 U/L (ref 15–41)
Albumin: 3.2 g/dL — ABNORMAL LOW (ref 3.5–5.0)
Alkaline Phosphatase: 65 U/L (ref 38–126)
Anion gap: 11 (ref 5–15)
BUN: 19 mg/dL (ref 8–23)
CO2: 26 mmol/L (ref 22–32)
Calcium: 9.3 mg/dL (ref 8.9–10.3)
Chloride: 100 mmol/L (ref 98–111)
Creatinine, Ser: 1.08 mg/dL — ABNORMAL HIGH (ref 0.44–1.00)
GFR, Estimated: 57 mL/min — ABNORMAL LOW (ref >60)
Glucose, Bld: 213 mg/dL — ABNORMAL HIGH (ref 70–99)
Potassium: 4.2 mmol/L (ref 3.5–5.1)
Sodium: 137 mmol/L (ref 135–145)
Total Bilirubin: 0.6 mg/dL (ref 0.3–1.2)
Total Protein: 7.3 g/dL (ref 6.5–8.1)

## 2022-01-15 LAB — BASIC METABOLIC PANEL
Anion gap: 11 (ref 5–15)
BUN: 22 mg/dL (ref 8–23)
CO2: 26 mmol/L (ref 22–32)
Calcium: 9.5 mg/dL (ref 8.9–10.3)
Chloride: 99 mmol/L (ref 98–111)
Creatinine, Ser: 1.1 mg/dL — ABNORMAL HIGH (ref 0.44–1.00)
GFR, Estimated: 56 mL/min — ABNORMAL LOW (ref >60)
Glucose, Bld: 191 mg/dL — ABNORMAL HIGH (ref 70–99)
Potassium: 3.9 mmol/L (ref 3.5–5.1)
Sodium: 136 mmol/L (ref 135–145)

## 2022-01-15 LAB — CBC WITH DIFFERENTIAL/PLATELET
Abs Immature Granulocytes: 0.03 10*3/uL (ref 0.00–0.07)
Basophils Absolute: 0.1 10*3/uL (ref 0.0–0.1)
Basophils Relative: 1 %
Eosinophils Absolute: 0.4 10*3/uL (ref 0.0–0.5)
Eosinophils Relative: 5 %
HCT: 38.3 % (ref 36.0–46.0)
Hemoglobin: 12.7 g/dL (ref 12.0–15.0)
Immature Granulocytes: 0 %
Lymphocytes Relative: 22 %
Lymphs Abs: 1.7 10*3/uL (ref 0.7–4.0)
MCH: 27.8 pg (ref 26.0–34.0)
MCHC: 33.2 g/dL (ref 30.0–36.0)
MCV: 83.8 fL (ref 80.0–100.0)
Monocytes Absolute: 0.9 10*3/uL (ref 0.1–1.0)
Monocytes Relative: 12 %
Neutro Abs: 4.6 10*3/uL (ref 1.7–7.7)
Neutrophils Relative %: 60 %
Platelets: 398 10*3/uL (ref 150–400)
RBC: 4.57 MIL/uL (ref 3.87–5.11)
RDW: 13.4 % (ref 11.5–15.5)
WBC: 7.8 10*3/uL (ref 4.0–10.5)
nRBC: 0 % (ref 0.0–0.2)

## 2022-01-15 LAB — GLUCOSE, CAPILLARY
Glucose-Capillary: 201 mg/dL — ABNORMAL HIGH (ref 70–99)
Glucose-Capillary: 183 mg/dL — ABNORMAL HIGH (ref 70–99)
Glucose-Capillary: 190 mg/dL — ABNORMAL HIGH (ref 70–99)
Glucose-Capillary: 207 mg/dL — ABNORMAL HIGH (ref 70–99)

## 2022-01-15 NOTE — Progress Notes (Addendum)
Patient arrived to unit at 21:30 via wheel chair. Patient is alert and oriented x 4. Patient is continent of bowel and bladder. Need stedy  for safe transfers. Patient reports numbness on her left side. Patient called her mom and let her know she had been transferred. Patient requested Tylenol for left heel pain. Swallows pills whole with water. No issues over night.

## 2022-01-15 NOTE — Progress Notes (Signed)
Patient ID: Shelby Morgan, female   DOB: 05-22-1956, 66 y.o.   MRN: [id number] Met with the patient to review rehab process, team conference and plan of care. Discussed current situation compared to previous stroke along with secondary risk factors including  HTN, DM (A1C 7.1) and HLD ; changed to crestor from zocor per MD. Expressed concern about insulin vs oral agents; wasn't taking insulin PTA. Discussed rationale for insulin while in the hospital and was placed back on metformin today. Reviewed DAPT (ASA to Plavix x 3 weeks then Plavix solo). Also reviewed CPAP for sleep apnea study; not tolerating masks, respiratory working with the settings and masks. Continue to follow along to discharge to address educational needs to facilitate preparation for discharge home with Mom. Margarito Liner

## 2022-01-15 NOTE — Plan of Care (Signed)
  Problem: RH Balance Goal: LTG Patient will maintain dynamic sitting balance (PT) Description: LTG:  Patient will maintain dynamic sitting balance with assistance during mobility activities (PT) Flowsheets (Taken 01/15/2022 1913) LTG: Pt will maintain dynamic sitting balance during mobility activities with:: Independent with assistive device  Goal: LTG Patient will maintain dynamic standing balance (PT) Description: LTG:  Patient will maintain dynamic standing balance with assistance during mobility activities (PT) Flowsheets (Taken 01/15/2022 1913) LTG: Pt will maintain dynamic standing balance during mobility activities with:: Supervision/Verbal cueing   Problem: Sit to Stand Goal: LTG:  Patient will perform sit to stand with assistance level (PT) Description: LTG:  Patient will perform sit to stand with assistance level (PT) Flowsheets (Taken 01/15/2022 1913) LTG: PT will perform sit to stand in preparation for functional mobility with assistance level: Supervision/Verbal cueing   Problem: RH Bed Mobility Goal: LTG Patient will perform bed mobility with assist (PT) Description: LTG: Patient will perform bed mobility with assistance, with/without cues (PT). Flowsheets (Taken 01/15/2022 1913) LTG: Pt will perform bed mobility with assistance level of: Independent with assistive device    Problem: RH Bed to Chair Transfers Goal: LTG Patient will perform bed/chair transfers w/assist (PT) Description: LTG: Patient will perform bed to chair transfers with assistance (PT). Flowsheets (Taken 01/15/2022 1913) LTG: Pt will perform Bed to Chair Transfers with assistance level: Supervision/Verbal cueing   Problem: RH Car Transfers Goal: LTG Patient will perform car transfers with assist (PT) Description: LTG: Patient will perform car transfers with assistance (PT). Flowsheets (Taken 01/15/2022 1913) LTG: Pt will perform car transfers with assist:: Supervision/Verbal cueing   Problem: RH  Ambulation Goal: LTG Patient will ambulate in controlled environment (PT) Description: LTG: Patient will ambulate in a controlled environment, # of feet with assistance (PT). Flowsheets (Taken 01/15/2022 1913) LTG: Pt will ambulate in controlled environ  assist needed:: Supervision/Verbal cueing LTG: Ambulation distance in controlled environment: 140f using LRAD Goal: LTG Patient will ambulate in home environment (PT) Description: LTG: Patient will ambulate in home environment, # of feet with assistance (PT). Flowsheets (Taken 01/15/2022 1913) LTG: Pt will ambulate in home environ  assist needed:: Supervision/Verbal cueing LTG: Ambulation distance in home environment: 553fusing LRAD   Problem: RH Stairs Goal: LTG Patient will ambulate up and down stairs w/assist (PT) Description: LTG: Patient will ambulate up and down # of stairs with assistance (PT) Flowsheets (Taken 01/15/2022 1913) LTG: Pt will ambulate up/down stairs assist needed:: Supervision/Verbal cueing LTG: Pt will  ambulate up and down number of stairs: 4 steps using HRs per home set-up

## 2022-01-15 NOTE — Plan of Care (Signed)
  Problem: RH Balance Goal: LTG: Patient will maintain dynamic sitting balance (OT) Description: LTG:  Patient will maintain dynamic sitting balance with assistance during activities of daily living (OT) Flowsheets (Taken 01/15/2022 1631) LTG: Pt will maintain dynamic sitting balance during ADLs with: Independent with assistive device Goal: LTG Patient will maintain dynamic standing with ADLs (OT) Description: LTG:  Patient will maintain dynamic standing balance with assist during activities of daily living (OT)  Flowsheets (Taken 01/15/2022 1631) LTG: Pt will maintain dynamic standing balance during ADLs with: Supervision/Verbal cueing

## 2022-01-15 NOTE — Evaluation (Signed)
Speech Language Pathology Assessment and Plan  Patient Details  Name: Shelby Morgan MRN: [id number] Date of Birth: 25-Nov-1955  Today's Date: 01/15/2022 SLP Individual Time: 0900-1000 SLP Individual Time Calculation (min): 47 min  Hospital Problem: Principal Problem:   Right thalamic stroke Shelby Morgan) Active Problems:   Primary hypertension   Diabetes mellitus ([place])   AKI (acute kidney injury) ([place])  Past Medical History:  Past Medical History:  Diagnosis Date   Anxiety    Bell's palsy 01/08/2011   Chronic back pain    Depression    Diabetes mellitus    Diverticulosis    GERD (gastroesophageal reflux disease)    Hemorrhoids    Hepatomegaly    Hypertension    Lumbar stenosis    L2/L3--followed by Dr. Ernestina Patches   Melanoma [facility]) 2009   Dr [place] Crane, Stage 2, required only surgery   NAFLD (nonalcoholic fatty liver disease)    Skin cancer    Stroke ([place]) 07/2021   Past Surgical History:  Past Surgical History:  Procedure Laterality Date   CESAREAN SECTION     COLONOSCOPY  03/09/2011   Dr Oneida Alar diverticulosis, internal and external hemorrhoids   ESOPHAGOGASTRODUODENOSCOPY  03/09/2011   Esophageal stricture dilated to 16 MM savory, NSAID- induced gastritis and duodenitis   LEG SURGERY     plates/screws/hx fx-right leg   MELANOMA EXCISION     right knee   SAVORY DILATION  03/09/2011    Assessment / Plan / Recommendation Clinical Impression Shelby Morgan is a 66 year old female with history of HTN, NAFLD, HTN, T2DM, morbid obesity--BMI 41.9, melanoma (Dr. Nevada Crane), anxiety d/o, CVA 09/22; who was admitted via [facility] on 01/06/22 with sudden onset of severe numbness left face/arm/leg and weakness. CT head negative and TNK administered and she was transferred to [facility] for management.  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 MRI brain showed right thalamocapsular infarct with mild to moderate chronic microvascular changes.    Dr. Erlinda Hong felt that stroke was due to small vessel disease  and recommended DAPT X 3 weeks followed by Plavix alone. She qualified for Sleep Smart study and is tolerating CPAP use. She continues to be limited by mild left hemiparesis with sensory loss, balance deficits and weakness. She has noted pain in her LUE and LLE with certain activities. Reports residual sensory loss in RUE and RLE from prior CVA.  CIR was recommended due to functional decline.   Pt seen for speech-language-cognitive evaluation. Pt supports she is currently at baseline function with no noticeable cognitive-linguistic, speech, language, or swallowing changes s/p CVA. Pt stated she is considering discharging to her mother's home and would have 24 hour support with ADLs/iADLs as needed. Pt recalled events and details from previous therapy sessions while in acute care, described medication regime with medication names/purpose/frequency and exhibited appropriate problem solving, reasoning, and awareness of current deficits. Pt organized pillbox during acute care SLP session without error on 6/20. St noted mild processing delay at the time. The [facility] Mental Status Examination was completed to evaluate the pt's cognitive-linguistic skills. Pt achieved a score of 28/30 which is within the normal range. Scores suggestive of improvement as compared to initial evaluation in acute care with score of 24/30 obtained on 6/14. SLP administered "Solving Daily Math  Problems" subtest via the ALFA in which pt scored 10/10 in a timely manner. Consdiering the above evaluations and discussion with patient, cognitive-linguistic skills appear WFL. Pt is tolerating a regular/thin diet with no changes in expressive/receptive/motor speech. Further skilled  ST intervention does not appear clinically indicated at this time. Pt educated of evaluation results and verbalized understanding/agreement. ST to sign off.    Skilled Therapeutic Interventions          Pt participated in cognitive-linguistic, speech, language evaluations. Please see above.  SLP Assessment  Patient does not need any further Speech [place] Pathology Services    Recommendations  Patient destination: Home Follow up Recommendations: None Equipment Recommended: None recommended by SLP           Pain Pain Assessment Pain Scale: 0-10 Pain Score: 4  Pain Type: Acute pain Pain Location: Heel Pain Orientation: Right Pain Descriptors / Indicators: Other (Comment) ("R heel spur") Pain Intervention(s): Medication (See eMAR);Rest;Repositioned;Distraction;Emotional support Multiple Pain Sites: No  Prior Functioning Cognitive/Linguistic Baseline: Within functional limits Type of Home: House  Lives With: Alone;Other (Comment) (widowed 5 years - may D/C to her mother's home temporarily) Available Help at Discharge: Family;Available 24 hours/day (mother can provide 24hr CGA) Education: High school +  yrs of college Vocation: Retired  SLP Evaluation Cognition Overall Cognitive Status: Within Functional Limits for tasks assessed Arousal/Alertness: Awake/alert Orientation Level: Oriented X4 Year: 2023 Month: June Day of Week: Correct Attention: Focused;Sustained;Selective Focused Attention: Appears intact Sustained Attention: Appears intact Selective Attention: Appears intact Memory: Appears intact Memory Impairment:  (immediate: 5/5; delayed 5/5; paragraph 6/8) Awareness: Appears intact Problem Solving: Appears intact Executive Function: Sequencing;Organizing;Self Monitoring;Reasoning Reasoning: Appears intact Sequencing: Appears intact Organizing: Appears intact Self Monitoring: Appears intact Safety/Judgment: Appears intact  Comprehension Auditory  Comprehension Overall Auditory Comprehension: Appears within functional limits for tasks assessed Expression Expression Primary Mode of Expression: Verbal Verbal Expression Overall Verbal Expression: Appears within functional limits for tasks assessed Written Expression Dominant Hand: Right Oral Motor Oral Motor/Sensory Function Overall Oral Motor/Sensory Function: Within functional limits Motor Speech Overall Motor Speech: Appears within functional limits for tasks assessed  Care Tool Care Tool Cognition Ability to hear (with hearing aid or hearing appliances if normally used Ability to hear (with hearing aid or hearing appliances if normally used): 0. Adequate - no difficulty in normal conservation, social interaction, listening to TV   Expression of Ideas and Wants Expression of Ideas and Wants: 4. Without difficulty (complex and basic) - expresses complex messages without difficulty and with speech that is clear and easy to understand   Understanding Verbal and Non-Verbal Content Understanding Verbal and Non-Verbal Content: 4. Understands (complex and basic) - clear comprehension without cues or repetitions  Memory/Recall Ability Memory/Recall Ability : Current season;That he or she is in a hospital/hospital unit;Staff names and faces   Refer to Care Plan for Long Term Goals  Recommendations for other services: None   Discharge Criteria: Patient will be discharged from SLP if patient refuses treatment 3 consecutive times without medical reason, if treatment goals not met, if there is a change in medical status, if patient makes no progress towards goals or if patient is discharged from hospital.  The above assessment, treatment plan, treatment alternatives and goals were discussed and mutually agreed upon: by patient  Patty Sermons 01/15/2022, 12:40 PM

## 2022-01-15 NOTE — Progress Notes (Signed)
Nutrition Brief Note  RD received a consult for diet education.  Pt received diet education when admitted to the acute hospital. Previous RD added handouts to AVS for pt at discharge.   No further nutritional interventions warranted at this time. If additional nutrition issues arise, please re-consult RD.   Hermina Barters RD, LDN Clinical Dietitian See Shea Evans for contact information.

## 2022-01-15 NOTE — Progress Notes (Signed)
Inpatient [facility] Individual Statement of Services  Patient Name:  Shelby Morgan  Date:  01/15/2022  Welcome to the [facility].  Our goal is to provide you with an individualized program based on your diagnosis and situation, designed to meet your specific needs.  With this comprehensive rehabilitation program, you will be expected to participate in at least 3 hours of rehabilitation therapies Monday-Friday, with modified therapy programming on the weekends.  Your rehabilitation program will include the following services:  Physical Therapy (PT), Occupational Therapy (OT), Speech Therapy (ST), 24 hour per day rehabilitation nursing, Therapeutic Recreaction (TR), Neuropsychology, Care Coordinator, Rehabilitation Medicine, Nutrition Services, Pharmacy Services, and Other  Weekly team conferences will be held on Wednesdays to discuss your progress.  Your Inpatient Rehabilitation Care Coordinator will talk with you frequently to get your input and to update you on team discussions.  Team conferences with you and your family in attendance may also be held.  Expected length of stay: 10-12 Days  Overall anticipated outcome: MOD I to Supervision  Depending on your progress and recovery, your program may change. Your Inpatient Rehabilitation Care Coordinator will coordinate services and will keep you informed of any changes. Your Inpatient Rehabilitation Care Coordinator's name and contact numbers are listed  below.  The following services may also be recommended but are not provided by the [facility]:   [place] will be made to provide these services after discharge if needed.  Arrangements include referral to agencies that provide these services.  Your insurance has been verified to be:  [employer] Your primary doctor is:  Collene Mares, PA-C  Pertinent  information will be shared with your doctor and your insurance company.  Inpatient Rehabilitation Care Coordinator:  Erlene Quan, [place] or [phone]  Information discussed with and copy given to patient by: Dyanne Iha, 01/15/2022, 11:12 AM

## 2022-01-15 NOTE — Evaluation (Signed)
Physical Therapy Assessment and Plan  Patient Details  Name: Shelby Morgan MRN: [id number] Date of Birth: 1955/12/30  PT Diagnosis: Abnormality of gait, Difficulty walking, Hemiparesis non-dominant, Impaired sensation, Muscle weakness, and Pain in L heel Rehab Potential: Good ELOS: ~2 weeks   Today's Date: 01/15/2022 PT Individual Time: 1638-4536 PT Individual Time Calculation (min): 75 min    Hospital Problem: Principal Problem:   Right thalamic stroke [name]:   Primary hypertension   Diabetes mellitus ([place])   AKI (acute kidney injury) ([place])   Past Medical History:  Past Medical History:  Diagnosis Date   Anxiety    Bell's palsy 01/08/2011   Chronic back pain    Depression    Diabetes mellitus    Diverticulosis    GERD (gastroesophageal reflux disease)    Hemorrhoids    Hepatomegaly    Hypertension    Lumbar stenosis    L2/L3--followed by Dr. Ernestina Patches   Melanoma [facility]) 2009   Dr Nevada Crane, Stage 2, required only surgery   NAFLD (nonalcoholic fatty liver disease)    Skin cancer    Stroke ([place]) 07/2021   Past Surgical History:  Past Surgical History:  Procedure Laterality Date   CESAREAN SECTION     COLONOSCOPY  03/09/2011   Dr Oneida Alar diverticulosis, internal and external hemorrhoids   ESOPHAGOGASTRODUODENOSCOPY  03/09/2011   Esophageal stricture dilated to 16 MM savory, NSAID- induced gastritis and duodenitis   LEG SURGERY     plates/screws/hx fx-right leg   MELANOMA EXCISION     right knee   SAVORY DILATION  03/09/2011    Assessment & Plan Clinical Impression: Patient is a 66 y.o. year old female  with history of HTN, NAFLD, HTN, T2DM, morbid obesity--BMI 41.9, melanoma (Dr. Nevada Crane), anxiety d/o, CVA 09/22; who was admitted via [facility] on 01/06/22 with sudden onset of severe numbness left face/arm/leg and weakness. CT head negative and TNK administered and she was transferred to [facility] for management.  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 MRI brain showed right thalamocapsular infarct with mild to moderate chronic microvascular changes.    Dr. Erlinda Hong felt that stroke was due to small vessel disease  and recommended DAPT X 3 weeks followed by Plavix alone. She qualified for Sleep Smart study and is tolerating CPAP use. She continues to be limited by mild left hemiparesis with sensory loss, balance deficits and weakness. She has noted pain in her LUE and LLE with certain activities. Reports residual sensory loss in RUE and RLE from prior CVA.  CIR was recommended due to functional decline. Patient transferred to CIR on 01/14/2022 .   Patient currently requires mod assist with mobility secondary to muscle weakness, decreased cardiorespiratoy endurance, impaired timing and sequencing, unbalanced muscle activation, and decreased coordination, and decreased standing balance, decreased postural control, and decreased balance strategies.  Prior to hospitalization, patient was independent  with mobility and lived Alone in a House home.  Home access is 9Stairs to enter.  Patient will benefit from skilled PT intervention to maximize safe functional mobility, minimize fall risk, and decrease caregiver burden for planned discharge home with 24 hour supervision.  Anticipate patient will benefit from follow up OP at discharge.  PT - End of Session Activity Tolerance: Tolerates 30+ min activity with multiple  rests Endurance Deficit: Yes Endurance Deficit Description: requires seated rest break due to fatigue as well as pt reporting her back "gets tired" PT Assessment Rehab Potential (ACUTE/IP ONLY): Good PT Barriers to Discharge: [place] home environment;Home environment access/layout;Decreased caregiver  support PT Patient demonstrates impairments in the following area(s): Balance;Safety;Sensory;Skin Integrity;Endurance;Motor;Nutrition;Pain;Perception PT Transfers Functional Problem(s): Bed Mobility;Bed to Chair;Car;Furniture;Floor PT Locomotion Functional Problem(s): Ambulation;Stairs;Wheelchair Mobility PT Plan PT Intensity: Minimum of 1-2 x/day ,45 to 90 minutes PT Frequency: 5 out of 7 days PT Duration Estimated Length of Stay: ~2 weeks PT Treatment/Interventions: Ambulation/gait training;Community reintegration;DME/adaptive equipment instruction;Neuromuscular re-education;Psychosocial support;Stair training;UE/LE Strength taining/ROM;Balance/vestibular training;Discharge planning;Functional electrical stimulation;Pain management;Skin care/wound management;Therapeutic Activities;UE/LE Coordination activities;Cognitive remediation/compensation;Disease management/prevention;Splinting/orthotics;Patient/family education;Functional mobility training;Therapeutic Exercise;Visual/perceptual remediation/compensation;Wheelchair propulsion/positioning PT Transfers Anticipated Outcome(s): supervision using LRAD PT Locomotion Anticipated Outcome(s): supervision using LRAD PT Recommendation Recommendations for Other Services: Therapeutic Recreation consult Therapeutic Recreation Interventions: Stress management;Outing/community reintergration Follow Up Recommendations: Outpatient PT;24 hour supervision/assistance Patient destination: Home Equipment Recommended: To be determined   PT Evaluation Precautions/Restrictions Precautions Precautions: Fall;Other (comment) Precaution Comments: L hemiparesis with severely impaired sensation Restrictions Weight Bearing Restrictions: No Pain Pain Assessment Pain Scale: 0-10 Pain Score: 4  Pain Type: Acute pain Pain Location: Heel Pain Orientation: Right Pain Descriptors / Indicators: Other (Comment) ("R heel spur") Pain Onset: On-going Pain  Intervention(s): Medication (See eMAR);Rest;Repositioned;Distraction;Emotional support Multiple Pain Sites: No Pain Interference Pain Interference Pain Effect on Sleep: 2. Occasionally Pain Interference with Therapy Activities: 2. Occasionally Pain Interference with Day-to-Day Activities: 3. Frequently Home Living/Prior Functioning Home Living Available Help at Discharge: Family;Available 24 hours/day (mother can provide 24hr CGA) Type of Home: House Home Access: Stairs to enter [employer] of Steps: 9 (pt's house has 9 STE back of house (typical entrance) but could enter through front w/ 5 STE but would have to walk through grass) Entrance Stairs-Rails: Right;Left (can't reach both) Home Layout: Other (Comment) (mother's house has 2 STE no HRs, and there are 2 steps inside house to get to bed/bath (no rails but there is a wall for support))  Lives With: Alone;Other (Comment) (widowed 5 years - may D/C to her mother's home temporarily) Prior Function Level of Independence: Independent with gait;Independent with transfers;Independent with homemaking with ambulation  Able to Take Stairs?: Yes Driving: Yes Vocation: Retired Vision/Perception  Vision - History Ability to See in Adequate Light: 0 Adequate (wearing glasses) Perception Perception: Within Functional Limits Praxis Praxis: Intact  Cognition  Overall Cognitive Status: Within Functional Limits for tasks assessed Arousal/Alertness: Awake/alert Orientation Level: Oriented X4 Year: 2023 Month: June Day of Week: Correct Attention: Focused;Sustained;Selective Focused Attention: Appears intact Sustained Attention: Appears intact Selective Attention: Appears intact Memory: Appears intact Awareness: Appears intact Safety/Judgment: Appears intact Sensation  Sensation Light Touch: Impaired Detail Central sensation comments: reports some slight residual impaired sensation in palm of R hand and on sole of R foot from  prior CVA in 2022 Light Touch Impaired Details: Absent LLE Hot/Cold: Not tested Proprioception: Impaired Detail Proprioception Impaired Details: Impaired LLE Stereognosis: Not tested Coordination Gross Motor Movements are Fluid and Coordinated: No Coordination and Movement Description: mild L hemiparesis and impaired sensation impacting gross motor movements Motor  Motor Motor: Other (comment) Motor - Skilled Clinical Observations: mild L hemiparesis with impaired sensation   Trunk/Postural Assessment  Cervical Assessment Cervical Assessment: Within Functional Limits Thoracic Assessment Thoracic Assessment: Exceptions to [facility] (shoulder rounding) Lumbar Assessment Lumbar Assessment: Exceptions to [facility] (posterior pelvic tilt in sitting) Postural Control Postural Control: Deficits on evaluation Righting Reactions: delayed and insufficient with  poor motor planning/problem solving how to maintain balance likely due to anxiety Protective Responses: delayed and insufficient Postural Limitations: decreased  Balance Balance Balance Assessed: Yes Static Sitting Balance Static Sitting - Balance Support: Feet supported Static Sitting - Level of Assistance: 5: Stand by assistance Dynamic Sitting Balance Dynamic Sitting - Balance Support: Feet supported Dynamic Sitting - Level of Assistance: Other (comment);4: Min assist (CGA) Static Standing Balance Static Standing - Balance Support: During functional activity Static Standing - Level of Assistance: 4: Min assist Dynamic Standing Balance Dynamic Standing - Balance Support: During functional activity Dynamic Standing - Level of Assistance: 3: Mod assist Extremity Assessment      RLE Assessment RLE Assessment: Within Functional Limits Active Range of Motion (AROM) Comments: WFL/WNL RLE Strength Right Hip Flexion: 5/5 Right Knee Flexion: 5/5 Right Knee Extension: 5/5 Right Ankle Dorsiflexion: 5/5 Right Ankle Plantar Flexion: 5/5 LLE  Assessment LLE Assessment: Exceptions to [facility] Passive Range of Motion (PROM) Comments: WFL General Strength Comments: reports feeling "muscle pain" when activating muscles against resistance LLE Strength Left Hip Flexion: 3+/5 Left Knee Flexion: 3+/5 Left Knee Extension: 3+/5 Left Ankle Dorsiflexion: 3+/5 Left Ankle Plantar Flexion: 3+/5  Care Tool Care Tool Bed Mobility Roll left and right activity   Roll left and right assist level: Supervision/Verbal cueing    Sit to lying activity   Sit to lying assist level: Minimal Assistance - Patient > 75%    Lying to sitting on side of bed activity   Lying to sitting on side of bed assist level: the ability to move from lying on the back to sitting on the side of the bed with no back support.: Minimal Assistance - Patient > 75%     Care Tool Transfers Sit to stand transfer   Sit to stand assist level: Minimal Assistance - Patient > 75%    Chair/bed transfer   Chair/bed transfer assist level: Moderate Assistance - Patient 50 - 74%     Physiological scientist transfer assist level: Minimal Assistance - Patient > 75% (holding onto vehicle)      Care Tool Locomotion Ambulation   Assist level: 2 helpers Assistive device: Hand held assist Max distance: 27f  Walk 10 feet activity   Assist level: 2 helpers Assistive device: Hand held assist   Walk 50 feet with 2 turns activity Walk 50 feet with 2 turns activity did not occur: Safety/medical concerns      Walk 150 feet activity Walk 150 feet activity did not occur: Safety/medical concerns      Walk 10 feet on uneven surfaces activity Walk 10 feet on uneven surfaces activity did not occur: Safety/medical concerns      Stairs   Assist level: Minimal Assistance - Patient > 75% Stairs assistive device: 2 hand rails Max number of stairs: 1  Walk up/down 1 step activity   Walk up/down 1 step (curb) assist level: Minimal Assistance - Patient > 75% Walk up/down 1  step or curb assistive device: 2 hand rails  Walk up/down 4 steps activity Walk up/down 4 steps activity did not occur: Safety/medical concerns      Walk up/down 12 steps activity Walk up/down 12 steps activity did not occur: Safety/medical concerns      Pick up small objects from floor   Pick up small object from the floor assist level: Maximal Assistance - Patient 25 - 49%    Wheelchair Is the patient using a  wheelchair?: Yes (for transportation) Type of Wheelchair: Manual   Wheelchair assist level: Dependent - Patient 0%    Wheel 50 feet with 2 turns activity   Assist Level: Dependent - Patient 0%  Wheel 150 feet activity   Assist Level: Dependent - Patient 0%    Refer to Care Plan for Long Term Goals  SHORT TERM GOAL WEEK 1 PT Short Term Goal 1 (Week 1): Pt will perform supine<>sit with supervision PT Short Term Goal 2 (Week 1): Pt will perform sit<>stands using LRAD with CGA PT Short Term Goal 3 (Week 1): Pt will perform stand pivot transfers using LRAD with CGA PT Short Term Goal 4 (Week 1): Pt will ambulate at least 74f using LRAD with CGA PT Short Term Goal 5 (Week 1): Pt will ascend/descend 4 steps using HRs with CGA  Recommendations for other services: Therapeutic Recreation  Stress management and Outing/community reintegration  Skilled Therapeutic Intervention Pt received supine in bed and agreeable to therapy session. Evaluation completed (see details above) with patient education regarding purpose of PT evaluation, PT POC and goals, therapy schedule, weekly team meetings, and other CIR information including safety plan and fall risk safety. Pt performed the below functional mobility tasks with the specified levels of skilled cuing and assist. At end of session, pt left seated in w/c with needs in reach and seat belt alarm on.  Mobility Bed Mobility Bed Mobility: Supine to Sit;Sit to Supine Supine to Sit: Minimal Assistance - Patient > 75% Sit to Supine: Minimal  Assistance - Patient > 75% Transfers Transfers: Sit to Stand;Stand to Sit;Stand Pivot Transfers Sit to Stand: Minimal Assistance - Patient > 75% Stand to Sit: Minimal Assistance - Patient > 75% Stand Pivot Transfers: Minimal Assistance - Patient > 75% Stand Pivot Transfer Details: Verbal cues for precautions/safety;Verbal cues for technique;Verbal cues for gait pattern;Manual facilitation for weight shifting;Tactile cues for posture;Tactile cues for sequencing;Tactile cues for weight shifting;Verbal cues for sequencing Locomotion  Gait Ambulation: Yes Gait Assistance: 2 Helpers;Moderate Assistance - Patient 50-74% (+2 mod assist when using B HHA vs mod assist of 1 when using RW) Gait Distance (Feet): 15 Feet (+ 184f+ 4072fAssistive device: Rolling walker;2 person hand held assist (40f48fth B HHA and then remainder of walks with RW) Gait Assistance Details: Manual facilitation for weight shifting;Verbal cues for gait pattern;Verbal cues for technique;Verbal cues for precautions/safety;Verbal cues for safe use of DME/AE;Verbal cues for sequencing;Tactile cues for sequencing;Tactile cues for weight shifting;Tactile cues for posture;Visual cues for safe use of DME/AE Gait Gait: Yes Gait Pattern: Impaired Gait Pattern: Decreased step length - left;Decreased step length - right;Step-to pattern;Decreased stride length;Decreased hip/knee flexion - left;Narrow base of support;Scissoring (pt ambulates with slow, guarded speed with rigid upper body posturing; L LE scissoring; L LE kept in extended posture during swing (likely due to pt fear of knee buckling); step-to pattern; and poor forward movement of AD) Gait velocity: significantly decreased Stairs / Additional Locomotion Stairs: Yes Stairs Assistance: Minimal Assistance - Patient > 75%;Moderate Assistance - Patient 50 - 74% Stair Management Technique: Two rails;Forwards;Backwards (step up and then back down on 1 step) Number of Stairs: 1 Height  of Stairs: 6 Wheelchair Mobility Wheelchair Mobility: No   Discharge Criteria: Patient will be discharged from PT if patient refuses treatment 3 consecutive times without medical reason, if treatment goals not met, if there is a change in medical status, if patient makes no progress towards goals or if patient is discharged from hospital.  The  above assessment, treatment plan, treatment alternatives and goals were discussed and mutually agreed upon: by patient  Tawana Scale , PT, DPT, NCS, CSRS 01/15/2022, 8:02 AM

## 2022-01-15 NOTE — Evaluation (Signed)
Occupational Therapy Assessment and Plan  Patient Details  Name: Shelby Morgan MRN: [id number] Date of Birth: 1956-05-20  OT Diagnosis: abnormal posture, acute pain, hemiplegia affecting non-dominant side, muscle weakness (generalized), pain in joint, and swelling of limb Rehab Potential: Rehab Potential (ACUTE ONLY): Good ELOS: 12-14 days   Today's Date: 01/15/2022 OT Individual Time: 9371-6967 OT Individual Time Calculation (min): 78 min     Hospital Problem: Principal Problem:   Right thalamic stroke ([place]) Active Problems:   Primary hypertension   Diabetes mellitus ([place])   AKI (acute kidney injury) ([place])   Past Medical History:  Past Medical History:  Diagnosis Date   Anxiety    Bell's palsy 01/08/2011   Chronic back pain    Depression    Diabetes mellitus    Diverticulosis    GERD (gastroesophageal reflux disease)    Hemorrhoids    Hepatomegaly    Hypertension    Lumbar stenosis    L2/L3--followed by Dr. Ernestina Patches   Melanoma [facility]) 2009   Dr Nevada Crane, Stage 2, required only surgery   NAFLD (nonalcoholic fatty liver disease)    Skin cancer    Stroke ([place]) 07/2021   Past Surgical History:  Past Surgical History:  Procedure Laterality Date   CESAREAN SECTION     COLONOSCOPY  03/09/2011   Dr Oneida Alar diverticulosis, internal and external hemorrhoids   ESOPHAGOGASTRODUODENOSCOPY  03/09/2011   Esophageal stricture dilated to 16 MM savory, NSAID- induced gastritis and duodenitis   LEG SURGERY     plates/screws/hx fx-right leg   MELANOMA EXCISION     right knee   SAVORY DILATION  03/09/2011    Assessment & Plan Clinical Impression: Patient is a 66 y.o. female with history of HTN, NAFLD, HTN, T2DM, morbid obesity--BMI 41.9, melanoma (Dr. Nevada Crane), anxiety d/o, CVA 09/22; who was admitted via [facility] on 01/06/22 with sudden onset of severe numbness left face/arm/leg and weakness. CT head negative and TNK administered and she was transferred to [facility] for management.  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 MRI brain showed right thalamocapsular infarct with mild to moderate chronic microvascular changes.  Patient transferred to CIR on 01/14/2022 .    Patient currently requires mod with basic self-care skills secondary to muscle weakness, decreased cardiorespiratoy endurance, impaired timing and sequencing, unbalanced muscle activation, and decreased coordination, and decreased sitting balance, decreased standing balance, decreased postural control, hemiplegia, and decreased balance strategies.  Prior to hospitalization, patient could complete ADL/IADL with independent .  Patient will benefit from skilled intervention to decrease level of assist with basic self-care skills, increase independence with basic self-care skills, and increase level of independence with iADL prior to discharge home with care partner.  Anticipate patient will require intermittent supervision and follow up outpatient.  OT - End of Session Activity Tolerance: Decreased this session Endurance Deficit: Yes Endurance Deficit Description: requires seated rest break due to fatigue as well as pt reporting her back "gets tired" OT Assessment Rehab Potential (ACUTE ONLY): Good OT Barriers to Discharge Comments: lives alone - can stay with mom if assistance needed OT Patient demonstrates impairments in the following area(s): Balance;Behavior;Pain;Edema;Safety;Endurance;Sensory;Motor OT Basic ADL's Functional Problem(s): Eating;Grooming;Bathing;Dressing;Toileting OT Advanced ADL's Functional Problem(s): Simple Meal Preparation;Laundry;Light Housekeeping OT Transfers Functional Problem(s): Toilet;Tub/Shower OT Additional Impairment(s): Fuctional Use of Upper  Extremity OT Plan OT Intensity: Minimum of 1-2 x/day, 45 to 90 minutes OT Frequency: 5 out of 7 days OT Duration/Estimated Length of Stay: 12-14 days OT Treatment/Interventions: Balance/vestibular training;Community reintegration;Disease mangement/prevention;Neuromuscular re-education;Patient/family education;Self Care/advanced ADL retraining;Splinting/orthotics;Therapeutic Exercise;UE/LE Coordination activities;Discharge planning;DME/adaptive equipment instruction;Functional mobility training;Pain management;Psychosocial support;Therapeutic Activities;UE/LE Strength taining/ROM OT Self Feeding Anticipated Outcome(s): mod I OT Basic Self-Care Anticipated Outcome(s): Supervision OT Toileting Anticipated Outcome(s): supervision OT Bathroom Transfers Anticipated Outcome(s): supervision OT Recommendation Recommendations for Other Services: Neuropsych consult (very anxious at times) Patient destination: Home Follow Up Recommendations: Outpatient OT Equipment Recommended: 3 in 1 bedside comode;To be determined Equipment Details: will depend on d/d destination   OT Evaluation Precautions/Restrictions  Precautions Precautions: Fall;Other (comment) Precaution Comments: L hemiparesis with severely impaired sensation Restrictions Weight Bearing Restrictions: No LLE Weight Bearing: Weight bearing as tolerated General Chart Reviewed: Yes Additional Pertinent History: HTN, NAFLD, T2DM, morbid obesity--BMI 41.9, melanoma (Dr. Nevada Crane), anxiety d/o, CVA 09/22; Family/Caregiver Present: No Vital Signs Therapy Vitals Temp: 97.9 F (36.6 C) Temp Source: Oral Pulse Rate: 83 Resp: 16 BP: 127/71 Patient Position (if appropriate): Lying Oxygen Therapy SpO2: 98 % O2 Device: Room Air Pain Pain Assessment Pain Scale: 0-10 Pain Score: 0-No pain Home Living/Prior Functioning Home Living Family/patient expects to be discharged to:: Private residence Living Arrangements: Alone Available Help at  Discharge: Family, Available 24 hours/day Type of Home: House Home Access: Stairs to enter Technical brewer of Steps: 9 Entrance Stairs-Rails: Right, Left Home Layout: Other (Comment) Bathroom Shower/Tub: Chiropodist: Standard  Lives With: Alone IADL History Homemaking Responsibilities: Yes Meal Prep Responsibility: Primary Laundry Responsibility: Primary Cleaning Responsibility: Primary Bill Paying/Finance Responsibility: Primary Shopping Responsibility: Primary Current License: Yes Mode of Transportation: Car Education: High school +  yrs of college Occupation: Retired Leisure and Hobbies: movies, boating with family Prior Function Level of Independence: Independent with gait, Independent with transfers, Independent with homemaking with ambulation  Able to Take Stairs?: Yes Driving: Yes Vocation: Retired Surveyor, mining Baseline Vision/History: 1 Wears glasses Ability to See in Adequate Light: 0 Adequate Patient Visual Report: No change from baseline Vision Assessment?: No apparent visual deficits Perception  Perception: Within Functional Limits Praxis Praxis: Intact Cognition Cognition Overall Cognitive Status: Within Functional Limits for tasks assessed Arousal/Alertness: Awake/alert Memory: Appears intact Memory Impairment:  (immediate: 5/5; delayed 5/5; paragraph 6/8) Attention: Focused;Sustained;Selective Focused Attention: Appears intact Sustained Attention: Appears intact Selective Attention: Appears intact Awareness: Appears intact Problem Solving: Appears intact Problem Solving Impairment: Verbal complex Executive Function: Sequencing;Organizing;Self Monitoring;Reasoning Reasoning: Appears intact Sequencing: Appears intact Sequencing Impairment: Verbal complex Organizing: Appears intact Organizing Impairment: Verbal complex Self Monitoring: Appears intact Safety/Judgment: Appears intact Brief Interview for Mental Status  (BIMS) Repetition of Three Words (First Attempt): 3 Temporal Orientation: Year: Correct Temporal Orientation: Month: Accurate within 5 days Temporal Orientation: Day: Correct Recall: "Sock": Yes, no cue required Recall: "Blue": Yes, no cue required Recall: "Bed": Yes, no cue required BIMS Summary Score: 15 Sensation Sensation Light Touch: Impaired Detail (nearly absent LUE,    RUE - diminished from stroke 2022) Light Touch Impaired Details: Absent LUE Proprioception Impaired Details: Absent LUE Coordination Gross Motor Movements are Fluid and Coordinated: No Fine Motor Movements are Fluid and Coordinated: No Coordination and Movement Description: mild L hemiparesis and impaired sensation impacting gross and fine motor movements Finger Nose Finger Test: poorly graded control of LUE Motor  Motor Motor: Motor impersistence Motor - Skilled Clinical Observations: mild L hemiparesis with impaired sensation  Trunk/Postural Assessment  Cervical Assessment Cervical Assessment: Within  Functional Limits Thoracic Assessment Thoracic Assessment: Exceptions to [facility] Lumbar Assessment Lumbar Assessment: Exceptions to [facility] Postural Control Postural Control: Deficits on evaluation Righting Reactions: delayed and insufficient with poor motor planning/problem solving how to maintain balance likely due to anxiety and sensory deficits throughtout left side Protective Responses: delayed and insufficient Postural Limitations: decreased  Balance Balance Balance Assessed: Yes Static Sitting Balance Static Sitting - Balance Support: Feet supported Static Sitting - Level of Assistance: 5: Stand by assistance Dynamic Sitting Balance Dynamic Sitting - Balance Support: Feet supported Dynamic Sitting - Level of Assistance: 4: Min assist Dynamic Sitting Balance - Compensations: attempts to use LUE as support with limited effectiveness Dynamic Sitting - Balance Activities: Lateral lean/weight  shifting;Forward lean/weight shifting;Reaching for objects Static Standing Balance Static Standing - Balance Support: During functional activity Static Standing - Level of Assistance: 3: Mod assist Static Standing - Comment/# of Minutes: 10 sec Dynamic Standing Balance Dynamic Standing - Balance Support: During functional activity Dynamic Standing - Level of Assistance: 3: Mod assist Dynamic Standing - Balance Activities: Lateral lean/weight shifting;Forward lean/weight shifting Extremity/Trunk Assessment RUE Assessment RUE Assessment: Within Functional Limits Active Range of Motion (AROM) Comments: Has overhead reach General Strength Comments: 4/5 throughout LUE Assessment LUE Assessment: Exceptions to [facility] Active Range of Motion (AROM) Comments: Limited end range flexion, abduction, elbow extension General Strength Comments: 3+/5 LUE Body System: Neuro Brunstrum levels for arm and hand: Arm;Hand Brunstrum level for arm: Stage V Relative Independence from Synergy Brunstrum level for hand: Stage VI Isolated joint movements  Care Tool Care Tool Self Care Eating   Eating Assist Level: Minimal Assistance - Patient > 75%    Oral Care    Oral Care Assist Level: Minimal Assistance - Patient > 75%    Bathing   Body parts bathed by patient: Right arm;Left arm;Chest;Abdomen;Front perineal area;Right upper leg;Left upper leg;Face   Body parts n/a: Right lower leg;Left lower leg;Buttocks Assist Level: Moderate Assistance - Patient 50 - 74%    Upper Body Dressing(including orthotics)   What is the patient wearing?: Bra;Pull over shirt   Assist Level: Maximal Assistance - Patient 25 - 49%    Lower Body Dressing (excluding footwear)   What is the patient wearing?: Underwear/pull up;Pants Assist for lower body dressing: Maximal Assistance - Patient 25 - 49% (time constraints)    Putting on/Taking off footwear   What is the patient wearing?: Non-skid slipper socks Assist for footwear:  Maximal Assistance - Patient 25 - 49%       Care Tool Toileting Toileting activity   Assist for toileting: Moderate Assistance - Patient 50 - 74%     Care Tool Bed Mobility Roll left and right activity   Roll left and right assist level: Supervision/Verbal cueing    Sit to lying activity   Sit to lying assist level: Minimal Assistance - Patient > 75%    Lying to sitting on side of bed activity   Lying to sitting on side of bed assist level: the ability to move from lying on the back to sitting on the side of the bed with no back support.: Minimal Assistance - Patient > 75%     Care Tool Transfers Sit to stand transfer   Sit to stand assist level: Minimal Assistance - Patient > 75%    Chair/bed transfer   Chair/bed transfer assist level: Moderate Assistance - Patient 50 - 74%     Toilet transfer   Assist Level: Moderate Assistance - Patient 50 - 74%  Care Tool Cognition  Expression of Ideas and Wants Expression of Ideas and Wants: 4. Without difficulty (complex and basic) - expresses complex messages without difficulty and with speech that is clear and easy to understand  Understanding Verbal and Non-Verbal Content Understanding Verbal and Non-Verbal Content: 4. Understands (complex and basic) - clear comprehension without cues or repetitions   Memory/Recall Ability Memory/Recall Ability : Current season;That he or she is in a hospital/hospital unit;Staff names and faces   Refer to Care Plan for Long Term Goals  SHORT TERM GOAL WEEK 1 OT Short Term Goal 1 (Week 1): Patient will bathe her upper body with min assistance using BUE OT Short Term Goal 2 (Week 1): Patient will bathe lower body with min assist using BUE OT Short Term Goal 3 (Week 1): Patient will transfer to toilet with min assist OT Short Term Goal 4 (Week 1): Patient will demonstrate sufficient dynamic stand tolerance and balance for lower body dressing, perianal care with min assist OT Short Term Goal 5  (Week 1): Patient will use BUE to cut up food or complete other bimanual tasks with cueing and supervision  Recommendations for other services: Neuropsych   Skilled Therapeutic Intervention ADL ADL Eating: Minimal assistance Where Assessed-Eating: Chair Grooming: Minimal assistance Where Assessed-Grooming: Sitting at sink Upper Body Bathing: Minimal assistance Where Assessed-Upper Body Bathing: Shower Lower Body Bathing: Moderate assistance Where Assessed-Lower Body Bathing: Shower Upper Body Dressing: Maximal assistance Where Assessed-Upper Body Dressing: Other (Comment) (seated on shower bench) Lower Body Dressing: Maximal assistance Where Assessed-Lower Body Dressing: Wheelchair Toileting: Moderate assistance Where Assessed-Toileting: Toilet;Bedside Commode Toilet Transfer: Moderate assistance Toilet Transfer Method: Stand pivot Toilet Transfer Equipment: Drop arm bedside commode Tub/Shower Transfer: Moderate assistance Social research officer, government: Moderate assistance Social research officer, government Method: Brewing technologist ADL Comments: Patient with absent sensation in LUE - yet frequent attempts to support self with left hand - cannot feel when hand slips from surface. Mobility  Bed Mobility Bed Mobility: Supine to Sit;Sit to Supine Supine to Sit: Minimal Assistance - Patient > 75% Sit to Supine: Minimal Assistance - Patient > 75% Transfers Sit to Stand: Minimal Assistance - Patient > 75% Stand to Sit: Minimal Assistance - Patient > 75%   Discharge Criteria: Patient will be discharged from OT if patient refuses treatment 3 consecutive times without medical reason, if treatment goals not met, if there is a change in medical status, if patient makes no progress towards goals or if patient is discharged from hospital.  The above assessment, treatment plan, treatment alternatives and goals were discussed and mutually agreed upon: by  patient  Mariah Milling 01/15/2022, 4:28 PM

## 2022-01-15 NOTE — Progress Notes (Signed)
Inpatient Rehabilitation  Patient information reviewed and entered into eRehab system by Shelby Morgan M. Shelby Morgan, M.A., CCC/SLP, PPS Coordinator.  Information including medical coding, functional ability and quality indicators will be reviewed and updated through discharge.    

## 2022-01-15 NOTE — Progress Notes (Signed)
Inpatient Rehabilitation Care Coordinator Assessment and Plan Patient Details  Name: Shelby Morgan MRN: [id number] Date of Birth: June 09, 1956  Today's Date: 01/15/2022  Hospital Problems: Principal Problem:   Right thalamic stroke [facility]) Active Problems:   Primary hypertension   Diabetes mellitus ([place])   AKI (acute kidney injury) [facility])  Past Medical History:  Past Medical History:  Diagnosis Date   Anxiety    Bell's palsy 01/08/2011   Chronic back pain    Depression    Diabetes mellitus    Diverticulosis    GERD (gastroesophageal reflux disease)    Hemorrhoids    Hepatomegaly    Hypertension    Lumbar stenosis    L2/L3--followed by Dr. Ernestina Patches   Melanoma [facility]) 2009   Dr Nevada Crane, Stage 2, required only surgery   NAFLD (nonalcoholic fatty liver disease)    Skin cancer    Stroke ([place]) 07/2021   Past Surgical History:  Past Surgical History:  Procedure Laterality Date   CESAREAN SECTION     COLONOSCOPY  03/09/2011   Dr Oneida Alar diverticulosis, internal and external hemorrhoids   ESOPHAGOGASTRODUODENOSCOPY  03/09/2011   Esophageal stricture dilated to 16 MM savory, NSAID- induced gastritis and duodenitis   LEG SURGERY     plates/screws/hx fx-right leg   MELANOMA EXCISION     right knee   SAVORY DILATION  03/09/2011   Social History:  reports that she quit smoking about 28 years ago. Her smoking use included cigarettes. She has a 5.50 pack-year smoking history. She has never used smokeless tobacco. She reports current alcohol use. She reports that she does not use drugs.  Family / Support Systems Children: Lennette Bihari (Son), Marya Amsler (Son) Other Supports: Inez Catalina (Mother) Anticipated Caregiver: Family working on 24/7 supervision Ability/Limitations of Caregiver: son lives out of town and mother is elderly and does not drive Careers adviser: Other (Comment) Family Dynamics: support from mother and son  Social History Preferred language: English Religion: Personnel officer - How often do you need to have someone help you when you read instructions, pamphlets, or other written material from your doctor or pharmacy?: Never Writes: Yes   Abuse/Neglect Abuse/Neglect Assessment Can Be Completed: Yes Physical Abuse: Denies Verbal Abuse: Denies Sexual Abuse: Denies Exploitation of patient/patient's resources: Denies Self-Neglect: Denies  Patient response to: Social Isolation - How often do you feel lonely or isolated from those around you?: Never  Emotional Status Recent Psychosocial Issues: coping Psychiatric History: hx of anxiety and depression Substance Abuse History: n/a  Patient / Family Perceptions, Expectations & Goals Premorbid pt/family roles/activities: Independent and Driving Anticipated changes in roles/activities/participation: Patient mother is elderly, does not drive. Son lives out of town Pt/family expectations/goals: MOD I to [employer] Agencies: None Premorbid Home Care/DME Agencies: Other (Comment) ([facility], [facility], [facility]) Transportation available at discharge: Family in the process of arranging Is the patient able to respond to transportation needs?: Yes In the past 12 months, has lack of transportation kept you from medical appointments or from getting medications?: No In the past 12 months, has lack of transportation kept you from meetings, work, or from getting things needed for daily living?: No Resource referrals recommended: Neuropsychology (hx of anxiety and depression)  Discharge Planning Living Arrangements: Alone Support Systems: Children, Parent Type of Residence: Private residence Insurance Resources: Multimedia programmer (specify) Scientist, clinical (histocompatibility and immunogenetics) Medicare) Financial Resources: Family Support Financial Screen Referred: No Living Expenses: Lives with family Money Management: Patient Does the patient have any problems obtaining your medications?: No  Home Management:  Independent Care Coordinator Barriers to Discharge: Lack of/limited family support, Decreased caregiver support, Insurance for SNF coverage Care Coordinator Anticipated Follow Up Needs: HH/OP Expected length of stay: 10-12 Days  Clinical Impression Sw met with patient, introduced self and provided contact information. Patient will discharge home with her mother who has a ramped entrance in the back or two steps in the front to enter. Mother only able to provide supervision, patient anticipating MOD I goals. No additional questions or concerns, SW will continue to follow up.   Dyanne Iha 01/15/2022, 12:34 PM

## 2022-01-16 LAB — GLUCOSE, CAPILLARY
Glucose-Capillary: 193 mg/dL — ABNORMAL HIGH (ref 70–99)
Glucose-Capillary: 154 mg/dL — ABNORMAL HIGH (ref 70–99)
Glucose-Capillary: 160 mg/dL — ABNORMAL HIGH (ref 70–99)
Glucose-Capillary: 166 mg/dL — ABNORMAL HIGH (ref 70–99)

## 2022-01-16 MED ORDER — DICLOFENAC SODIUM 1 % EX GEL
2.0000 g | Freq: Four times a day (QID) | CUTANEOUS | Status: DC
  Administered 2022-01-16 – 2022-01-20 (×16): 2 g via TOPICAL
  Filled 2022-01-16: qty 100

## 2022-01-16 MED ORDER — POLYVINYL ALCOHOL 1.4 % OP SOLN
1.0000 [drp] | OPHTHALMIC | Status: DC | PRN
  Administered 2022-01-16 – 2022-01-27 (×14): 1 [drp] via OPHTHALMIC
  Filled 2022-01-16: qty 15

## 2022-01-16 MED ORDER — ARTIFICIAL TEARS OPHTHALMIC OINT
TOPICAL_OINTMENT | Freq: Every day | OPHTHALMIC | Status: DC
  Filled 2022-01-16: qty 3.5

## 2022-01-16 MED ORDER — LINAGLIPTIN 5 MG PO TABS
5.0000 mg | ORAL_TABLET | Freq: Every day | ORAL | Status: DC
  Administered 2022-01-16 – 2022-01-29 (×14): 5 mg via ORAL
  Filled 2022-01-16 (×14): qty 1

## 2022-01-16 NOTE — Progress Notes (Signed)
Physical Therapy Session Note  Patient Details  Name: Shelby Morgan MRN: [id number] Date of Birth: October 27, 1955  Today's Date: 01/16/2022 PT Individual Time: 9563-8756 PT Individual Time Calculation (min): 30 min   Short Term Goals: Week 1:  PT Short Term Goal 1 (Week 1): Pt will perform supine<>sit with supervision PT Short Term Goal 2 (Week 1): Pt will perform sit<>stands using LRAD with CGA PT Short Term Goal 3 (Week 1): Pt will perform stand pivot transfers using LRAD with CGA PT Short Term Goal 4 (Week 1): Pt will ambulate at least 71ft using LRAD with CGA PT Short Term Goal 5 (Week 1): Pt will ascend/descend 4 steps using HRs with CGA  Skilled Therapeutic Interventions/Progress Updates: Pt presents supine in bed, finishing phone call.  Pt agreeable to therapy.  Pt transfers to EOB w/ supervision.  Pt transfers sit to stand w/ min A and then amb x 65' into hallway and returns to w/c w/ min A and verbal cues for safe turns.  Pt states L hand slips off hand rest.  Coban wrapped around grip to improve placement and retention. Pt amb x 65' x 2 more trials w/o c/o L hand slipping.  Pt amb w/ stiff L knee unless verbally cued for proper gait cycle.  Pt able to stand w/o UE support but still c/o numbness to L leg.  Pt returned to supine w/ supervision.  Bed alarm on and all needs in reach.     Therapy Documentation Precautions:  Precautions Precautions: Fall, Other (comment) Precaution Comments: L hemiparesis with severely impaired sensation Restrictions Weight Bearing Restrictions: No LLE Weight Bearing: Weight bearing as tolerated General:   Vital Signs:   Pain:0/10 Pain Assessment Pain Scale: 0-10 Pain Score: 6  Pain Type: Acute pain;Chronic pain Pain Location: Back Pain Orientation: Lower Pain Descriptors / Indicators: Aching;Discomfort;Dull Pain Frequency: Constant Pain Onset: On-going Pain Intervention(s): Medication (See eMAR)     Therapy/Group: Individual  Therapy  Lucio Edward 01/16/2022, 3:52 PM

## 2022-01-16 NOTE — Discharge Instructions (Addendum)
Inpatient Rehab Discharge Instructions  Shelby Morgan Discharge date and time:  01/29/22  Activities/Precautions/ Functional Status: Activity: no lifting, driving, or strenuous exercise till cleared by MD Diet: cardiac diet and diabetic diet Wound Care:   Functional status:  ___ No restrictions     ___ Walk up steps independently _X__ 24/7 supervision/assistance   ___ Walk up steps with assistance ___ Intermittent supervision/assistance  ___ Bathe/dress independently ___ Walk with walker     _X__ Bathe/dress with assistance ___ Walk Independently    ___ Shower independently ___ Walk with assistance    ___ Shower with assistance _X__ No alcohol     ___ Return to work/school ________    COMMUNITY REFERRALS UPON DISCHARGE:     Outpatient: PT     OT    ST                Agency: [facility] Phone: [phone]              Appointment Date/Time: TBD  Medical Equipment/Items Ordered: Vassie Moselle                                                  Agency/Supplier: [facility] [phone]    Special Instructions: Monitor blood sugars 2-4 times a day before meals/bedtime.  2. Monitor blood pressure 2-3 times a day --if your blood pressure is greater than 140 consistently contact your PCP for input 3. DO NOT TAKE LISINOPRIL DUE TO CONCERNS THAT IT IS CAUSING DAMAGE TO YOUR KIDNEY. WE DON'T WANT YOUR BP TO BE TOO HIGH    STROKE/TIA DISCHARGE INSTRUCTIONS SMOKING Cigarette smoking nearly doubles your risk of having a stroke & is the single most alterable risk factor  If you smoke or have smoked in the last 12 months, you are advised to quit smoking for your health. Most of the excess cardiovascular risk related to smoking disappears within a year of stopping. Ask you doctor about anti-smoking medications [facility] Quit Line: 1-800-QUIT NOW Free Smoking Cessation Classes [phone]  CHOLESTEROL Know your levels; limit fat & cholesterol in your diet  Lipid Panel     Component  Value Date/Time   CHOL 156 01/07/2022 0418   TRIG 107 01/07/2022 0418   HDL 53 01/07/2022 0418   CHOLHDL 2.9 01/07/2022 0418   VLDL 21 01/07/2022 0418   LDLCALC 82 01/07/2022 0418     Many patients benefit from treatment even if their cholesterol is at goal. Goal: Total Cholesterol (CHOL) less than 160 Goal:  Triglycerides (TRIG) less than 150 Goal:  HDL greater than 40 Goal:  LDL (LDLCALC) less than 100   BLOOD PRESSURE American Stroke Association blood pressure target is less that 120/80 mm/Hg  Your discharge blood pressure is:  BP: 128/63 Monitor your blood pressure Limit your salt and alcohol intake Many individuals will require more than one medication for high blood pressure  DIABETES (A1c is a blood sugar average for last 3 months) Goal HGBA1c is under 7% (HBGA1c is blood sugar average for last 3 months)  Diabetes:     Lab Results  Component Value Date   HGBA1C 7.1 (H) 01/07/2022    Your HGBA1c can be lowered with medications, healthy diet, and exercise. Check your blood sugar as directed by your physician Call your physician if you experience unexplained or low blood sugars.  PHYSICAL ACTIVITY/REHABILITATION Goal is 30 minutes at least 4 days per week  Activity: No driving, Therapies: see above Return to work: N/a Activity decreases your risk of heart attack and stroke and makes your heart stronger.  It helps control your weight and blood pressure; helps you relax and can improve your mood. Participate in a regular exercise program. Talk with your doctor about the best form of exercise for you (dancing, walking, swimming, cycling).  DIET/WEIGHT Goal is to maintain a healthy weight  Your discharge diet is:  Diet Order             Diet heart healthy/carb modified Room service appropriate? No; Fluid consistency: Thin  Diet effective now                   liquids Your height is:  Height: '5\' 2"'$  (157.5 cm) Your current weight is: 216 lbs Your Body Mass Index (BMI)  is:  BMI (Calculated): 39.91 Following the type of diet specifically designed for you will help prevent another stroke. Your goal weight  is:  136 lbs Your goal Body Mass Index (BMI) is 19-24. Healthy food habits can help reduce 3 risk factors for stroke:  High cholesterol, hypertension, and excess weight.  RESOURCES Stroke/Support Group:  Call [phone]   STROKE EDUCATION PROVIDED/REVIEWED AND GIVEN TO PATIENT Stroke warning signs and symptoms How to activate emergency medical system (call 911). Medications prescribed at discharge. Need for follow-up after discharge. Personal risk factors for stroke. Pneumonia vaccine given:  Flu vaccine given:  My questions have been answered, the writing is legible, and I understand these instructions.  I will adhere to these goals & educational materials that have been provided to me after my discharge from the hospital.      My questions have been answered and I understand these instructions. I will adhere to these goals and the provided educational materials after my discharge from the hospital.  Patient/Caregiver Signature _______________________________ Date __________  Clinician Signature _______________________________________ Date __________  Please bring this form and your medication list with you to all your follow-up doctor's appointments.

## 2022-01-17 LAB — GLUCOSE, CAPILLARY
Glucose-Capillary: 166 mg/dL — ABNORMAL HIGH (ref 70–99)
Glucose-Capillary: 140 mg/dL — ABNORMAL HIGH (ref 70–99)
Glucose-Capillary: 148 mg/dL — ABNORMAL HIGH (ref 70–99)
Glucose-Capillary: 140 mg/dL — ABNORMAL HIGH (ref 70–99)

## 2022-01-17 MED ORDER — GABAPENTIN 100 MG PO CAPS
100.0000 mg | ORAL_CAPSULE | Freq: Three times a day (TID) | ORAL | Status: DC
  Administered 2022-01-17 – 2022-01-29 (×36): 100 mg via ORAL
  Filled 2022-01-17 (×36): qty 1

## 2022-01-17 NOTE — Progress Notes (Signed)
Occupational Therapy Session Note  Patient Details  Name: Shelby Morgan MRN: [id number] Date of Birth: 08-29-1955  Today's Date: 01/17/2022 OT Individual Time: 0700-0800 OT Individual Time Calculation (min): 60 min    Short Term Goals: Week 1:  OT Short Term Goal 1 (Week 1): Patient will bathe her upper body with min assistance using BUE OT Short Term Goal 2 (Week 1): Patient will bathe lower body with min assist using BUE OT Short Term Goal 3 (Week 1): Patient will transfer to toilet with min assist OT Short Term Goal 4 (Week 1): Patient will demonstrate sufficient dynamic stand tolerance and balance for lower body dressing, perianal care with min assist OT Short Term Goal 5 (Week 1): Patient will use BUE to cut up food or complete other bimanual tasks with cueing and supervision  Skilled Therapeutic Interventions/Progress Updates:    Pt eating breakfast upon arrival with OT assisting for peel top containers, however pt able to manage twist top containers. Pt requesting to bathe at sink on this date, completing stand pivot transfer bed>w/c with min A using RW. Pt completed oral care in sitting with setup assist and min cues for technique d/t LUE weakness. Completed bathing/dressing sit<>stand at sink with pt requiring min A for sit<>stand transfer, remaining in standing up to 30 sec. Pt demonstrates fear of falling resulting in flexed posture as she attempted to assist with managing clothing around waist. Pt verbalized recall of hemi dressing techniques, requiring min cues for carryover during task. At end of session, pt left sitting in w/c with all needs in reach.   Therapy Documentation Precautions:  Precautions Precautions: Fall, Other (comment) Precaution Comments: L hemiparesis with severely impaired sensation Restrictions Weight Bearing Restrictions: No LLE Weight Bearing: Weight bearing as tolerated General:   Vital Signs:   Pain:   ADL: ADL Eating: Minimal  assistance Where Assessed-Eating: Chair Grooming: Minimal assistance Where Assessed-Grooming: Sitting at sink Upper Body Bathing: Minimal assistance Where Assessed-Upper Body Bathing: Shower Lower Body Bathing: Moderate assistance Where Assessed-Lower Body Bathing: Shower Upper Body Dressing: Maximal assistance Where Assessed-Upper Body Dressing: Other (Comment) (seated on shower bench) Lower Body Dressing: Maximal assistance Where Assessed-Lower Body Dressing: Wheelchair Toileting: Moderate assistance Where Assessed-Toileting: Toilet, Psychiatrist Transfer: Moderate assistance Toilet Transfer Method: Stand pivot Toilet Transfer Equipment: Drop arm bedside commode Tub/Shower Transfer: Moderate assistance Film/video editor: Moderate assistance Film/video editor Method: Runner, broadcasting/film/video ADL Comments: Patient with absent sensation in LUE - yet frequent attempts to support self with left hand - cannot feel when hand slips from surface. Vision   Perception    Praxis   Balance   Exercises:   Other Treatments:     Therapy/Group: Individual Therapy  Daneil Dan 01/17/2022, 9:34 AM

## 2022-01-17 NOTE — Progress Notes (Signed)
Resting in good spirits, continue to verbalize discomfort pain to right heel and medicated with prn medication, ice pack also applied, states she is excited because she is starting to feel increase sensation to her left hand, shoulder and LE.CPAP on, Assisted as needed, and monitored

## 2022-01-17 NOTE — Progress Notes (Signed)
Restful and uneventful, no c/o Cpap in place and tolerated well, Monitor ans assisted prn

## 2022-01-18 LAB — GLUCOSE, CAPILLARY
Glucose-Capillary: 134 mg/dL — ABNORMAL HIGH (ref 70–99)
Glucose-Capillary: 128 mg/dL — ABNORMAL HIGH (ref 70–99)
Glucose-Capillary: 134 mg/dL — ABNORMAL HIGH (ref 70–99)
Glucose-Capillary: 144 mg/dL — ABNORMAL HIGH (ref 70–99)

## 2022-01-19 ENCOUNTER — Inpatient Hospital Stay (HOSPITAL_COMMUNITY): Payer: Medicare HMO

## 2022-01-19 DIAGNOSIS — M79671 Pain in right foot: Secondary | ICD-10-CM

## 2022-01-19 DIAGNOSIS — D649 Anemia, unspecified: Secondary | ICD-10-CM

## 2022-01-19 LAB — GLUCOSE, CAPILLARY
Glucose-Capillary: 154 mg/dL — ABNORMAL HIGH (ref 70–99)
Glucose-Capillary: 158 mg/dL — ABNORMAL HIGH (ref 70–99)
Glucose-Capillary: 156 mg/dL — ABNORMAL HIGH (ref 70–99)
Glucose-Capillary: 140 mg/dL — ABNORMAL HIGH (ref 70–99)

## 2022-01-19 LAB — CBC
HCT: 34.8 % — ABNORMAL LOW (ref 36.0–46.0)
Hemoglobin: 11 g/dL — ABNORMAL LOW (ref 12.0–15.0)
MCH: 27.2 pg (ref 26.0–34.0)
MCHC: 31.6 g/dL (ref 30.0–36.0)
MCV: 85.9 fL (ref 80.0–100.0)
Platelets: 409 10*3/uL — ABNORMAL HIGH (ref 150–400)
RBC: 4.05 MIL/uL (ref 3.87–5.11)
RDW: 13.5 % (ref 11.5–15.5)
WBC: 8.7 10*3/uL (ref 4.0–10.5)
nRBC: 0 % (ref 0.0–0.2)

## 2022-01-19 NOTE — Progress Notes (Signed)
Physical Therapy Session Note  Patient Details  Name: Shelby Morgan MRN: [id number] Date of Birth: Sep 17, 1955  Today's Date: 01/19/2022 PT Individual Time: 1047-1130 PT Individual Time Calculation (min): 43 min   Short Term Goals: Week 1:  PT Short Term Goal 1 (Week 1): Pt will perform supine<>sit with supervision PT Short Term Goal 2 (Week 1): Pt will perform sit<>stands using LRAD with CGA PT Short Term Goal 3 (Week 1): Pt will perform stand pivot transfers using LRAD with CGA PT Short Term Goal 4 (Week 1): Pt will ambulate at least 40ft using LRAD with CGA PT Short Term Goal 5 (Week 1): Pt will ascend/descend 4 steps using HRs with CGA Week 2:    Week 3:     Skilled Therapeutic Interventions/Progress Updates:    Pain - 6-7/10 when wbing on RLE due to "heelspur"  Supine to sit mod I stand pivot transfer to wc w/min assist, no AD. Commode transfer w/min assist using grab bar. Continent of urine, mod assist for clothing management, superivision for hygiene. Washes hands and combs hair at sink from wc level w/set up.  Pt declined gait citing R foot pain.  Further assessment reveals pain includes tenderness at achilles.   W/foot on airex pad and mild wedge, pt performed seated achilles stretches  Standing on airex under each foot worked on dynamic standing balance via reaching forward, lateral, turning head/sholders L/R, overhead reach, cga but pt very apprehensive/anxious.  States she feels "very wobbly" when performing to L side.  Repeated Sit to stand from wc first using RUE to push up x 5 w/cga (airex under feet to decrease pain) then switched to LUE x 5 w/cga to mod assist to power up and to stabilize w/fatigue.    Pt left oob in wc handed off to NT per NT request/changing linens in room.    Therapy Documentation Precautions:  Precautions Precautions: Fall, Other (comment) Precaution Comments: L hemiparesis with severely impaired sensation Restrictions Weight Bearing  Restrictions: No LLE Weight Bearing: Weight bearing as tolerated    Therapy/Group: Individual Therapy Rada Hay, PT   Shearon Balo 01/19/2022, 12:12 PM

## 2022-01-20 ENCOUNTER — Inpatient Hospital Stay (HOSPITAL_COMMUNITY): Payer: Medicare HMO

## 2022-01-20 LAB — GLUCOSE, CAPILLARY
Glucose-Capillary: 137 mg/dL — ABNORMAL HIGH (ref 70–99)
Glucose-Capillary: 154 mg/dL — ABNORMAL HIGH (ref 70–99)
Glucose-Capillary: 140 mg/dL — ABNORMAL HIGH (ref 70–99)
Glucose-Capillary: 132 mg/dL — ABNORMAL HIGH (ref 70–99)

## 2022-01-20 LAB — URIC ACID: Uric Acid, Serum: 9.7 mg/dL — ABNORMAL HIGH (ref 2.5–7.1)

## 2022-01-20 MED ORDER — DICLOFENAC SODIUM 1 % EX GEL
4.0000 g | Freq: Four times a day (QID) | CUTANEOUS | Status: DC
  Administered 2022-01-20 – 2022-01-28 (×32): 4 g via TOPICAL
  Filled 2022-01-20 (×2): qty 100

## 2022-01-20 NOTE — Progress Notes (Addendum)
Occupational Therapy Session Note  Patient Details  Name: Shelby Morgan MRN: [id number] Date of Birth: 03-01-1956  Today's Date: 01/20/2022 OT Individual Time: 0102-7253 session 1 OT Individual Time Calculation (min): 63 min  Session 2: [id number]   Short Term Goals: Week 1:  OT Short Term Goal 1 (Week 1): Patient will bathe her upper body with min assistance using BUE OT Short Term Goal 2 (Week 1): Patient will bathe lower body with min assist using BUE OT Short Term Goal 3 (Week 1): Patient will transfer to toilet with min assist OT Short Term Goal 4 (Week 1): Patient will demonstrate sufficient dynamic stand tolerance and balance for lower body dressing, perianal care with min assist OT Short Term Goal 5 (Week 1): Patient will use BUE to cut up food or complete other bimanual tasks with cueing and supervision  Skilled Therapeutic Interventions/Progress Updates:  Session 1: Pt greeted seated in w/c with RN present providing AM meds, pt  agreeable to OT intervention. Session focus on BADL reeducation, functional mobility, dynamic standing balance and decreasing overall caregiver burden.          Pt needed set- up assist to eat breakfast from w/c, pt compensating well with self feeding and using LUE mostly as stabilizer during ADLs.  Discussed DC plan of DC'ing to mothers house with a ramp and a tub shower combo, education provided on available DME such as TTB. Pt doesn't think she will need TTB but will continue to assess during LOS.  Pt completed seated oral care at sink with supervision, using compensatory strategies appropriately to compensate for lack of sensation in L hand. Pt completed bathing at sink with MIN A, education provided on compensatory methods when holding wash cloth in L hand, able to sit>stand with CGA at sink to wash LB. Pt donned [place] shirt with MIN A and MIN A for LB dressing via sit>stand.   pt left seated in w/c with alarm belt activated and all needs within reach.                    Session 2: Pt greeted seated in w/c   agreeable to OT intervention. Session focus on LUE [facility] and improved proprioception. Total A transport to gym where pt completed various therapeutic activities focused on LUE [facility] and improved motor planning and proprioception.                  pt left                     - utilized Theraputty to provide NMR to LUE with emphasis on [facility] and building intrinsic muscle strength - pt instructed to retrieve small plastic bugs from table with an emphasis on [facility] and in hand manipulation skills.  - pt completed Peg board task with pt instructed to both retrieve/remove pegs from board with LUE with an emphasis on pincer grasp and shifting object in hand, education provided on compensatory method of using vision to compensate for decreased sensation as pt was noted to have slight sensation in L thumb with medium pressure and improved overall sensation more proximal vs distal - using the BITS to facilitate improved motor planning in LUE with pt holding built up stylus to trace shapes on screen with overall supervision, MIN verbal cues for adequate hand placement around stylus with pt needing increased time to complete task. Pt left supine in bed with bed alarm activated and RLE elevated on pillows with  ice applied for pain mgmt.   Therapy Documentation Precautions:  Precautions Precautions: Fall, Other (comment) Precaution Comments: L hemiparesis with severely impaired sensation Restrictions Weight Bearing Restrictions: No LLE Weight Bearing: Weight bearing as tolerated  Pain:  session 1: 6/10 pain in back, R foot and L foot, RN provided pain meds during session     Therapy/Group: Individual Therapy  Pollyann Glen [facility] 01/20/2022, 12:05 PM

## 2022-01-20 NOTE — Progress Notes (Signed)
Physical Therapy Session Note  Patient Details  Name: Shelby Morgan MRN: [id number] Date of Birth: [birth year]  Today's Date: 01/20/2022 PT Individual Time: 1020-1117 and 4098-1191 PT Individual Time Calculation (min): 57 min and 42 min  Short Term Goals: Week 1:  PT Short Term Goal 1 (Week 1): Pt will perform supine<>sit with supervision PT Short Term Goal 2 (Week 1): Pt will perform sit<>stands using LRAD with CGA PT Short Term Goal 3 (Week 1): Pt will perform stand pivot transfers using LRAD with CGA PT Short Term Goal 4 (Week 1): Pt will ambulate at least 37ft using LRAD with CGA PT Short Term Goal 5 (Week 1): Pt will ascend/descend 4 steps using HRs with CGA  Skilled Therapeutic Interventions/Progress Updates:    Session 1: Pt received sitting in w/c and agreeable to therapy session. Reports having ice and "arthritis cream" used for R heel/Achille's pain and is currently rating pain as 3-4/10. Donned tennis shoes as pt states she thinks it feels better with them on - asked her mother to bring larger, tie-up shoes.  Transported to/from gym in w/c for time management and energy conservation.  Gait training ~54ft using RW x2 with primarily CGA/light min assist for steadying/balance - demos impaired L LE coordination with decreased knee flexion during swing phase and excessive adduction for a slight "robotic" looking gait but improving.  Stair navigation training ascending/descending 4 steps (6" height) x2 using B HRs with primarily CGA/light min assist for steadying - cuing for step-to pattern leading with R LE on ascent and L LE on descent and educated on this as safest stair technique. Pt reports feeling more confident and comfortable with stairs.  Gait training 28ft + 175ft using L HHA with min assist for balance - continues to have slightly too large of L LE step length compared to R LE but demos improved L LE knee flexion during swing - overall increased postural instability with  intermittent L anterior lean/LOB and pt utilizing HHA to maintain balance.  Dynamic gait training ~20 forward/backwards x2 using L HHA with consistent min assist for balance due to L lean - cuing to take smaller steps with L LE (and R LE) to focus on taking steps that are within her abilities to maintain her balance - improving.  Dynamic gait training via side stepping ~29ft in each direction x2 using L HHA with min assist for balance - more difficulty stepping towards L compared to R with a few instances of posterior and L LOB.   Discussed implications of absent/severely impaired sensation in L hemibody and its affects on her mobility.   Gait training 65ft using L HHA with min assist primarily through HHA due to tendency for L lean as described above.  Stair navigation training ascending/descending 4 steps x2 (6" height) using B HRs targeting reciprocal pattern for L LE NMR and pt requiring min assist for this and reporting increased difficulty with pt stating she is relying a lot more on her arms to perform this safely compared to step-to pattern.  Transported back to room and left seated in w/c with needs in reach and seat belt alarm on.   Session 2: Pt received sitting in w/c and agreeable to therapy session. Reports having had more "cream" placed on her R foot since last session to help with pain management.  Donned shoes min assist. Transported to/from gym in w/c for time management and energy conservation.   Gait training ~166ft using L HHA progressed to no UE  support the final ~39ft with consistent min assist for balance due to continued L lean - continues to have slightly longer L LE step length with decreased L LE stance time with UB guarded during gait due to fear of falling/instability.  Gait training ~143ft, no UE support, requiring consistent light min assist for balance and only intermittent heavier min assist due to primarily anterior LOB - with longer distance demonstrates  improving symmetry in gait with more rhythmic/fluid gait although still has some guarded UB posturing that is improving also.  Dynamic gait training and L UE NMR via forward stepping over 2 hockey sticks between mat table and bedside table to collect lego blocks (based on recall of picture), no UE support, with light min assist for balance - pt able to collect 2 legos in L hand if she uses her R UE to reposition the 1st block in her hand - has improved coordination in L UE to place lego blocks together to match the picture. Seated on EOM donned gloves with assist and cleaned lego blocks targeting L UE NMR.  Gait training ~151ft back to her room, no UE support, with consistent light min assist for balance - continues to have intermittent slight L lean/LOB, guarded UB posturing, and inconsistent L LE foot placement due to lack of sensation. Pt excited she was able to walk back to room.  Pt left seated in w/c with needs in reach and seat belt alarm on.  Therapy Documentation Precautions:  Precautions Precautions: Fall, Other (comment) Precaution Comments: L hemiparesis with severely impaired sensation Restrictions Weight Bearing Restrictions: No LLE Weight Bearing: Weight bearing as tolerated   Pain: Session 1: Rates R heel spur/Achille's pain as 3-4/10 throughout session - reports she is using ice and "arthritis cream" for pain management - provided seated rest breaks for pain management during session.   Session 2: R heel spur/Achille's pain still present but no complaints of pain during session.    Therapy/Group: Individual Therapy  Ginny Forth , PT, DPT, NCS, CSRS 01/20/2022, 7:58 AM

## 2022-01-21 LAB — BASIC METABOLIC PANEL
Anion gap: 13 (ref 5–15)
BUN: 35 mg/dL — ABNORMAL HIGH (ref 8–23)
CO2: 25 mmol/L (ref 22–32)
Calcium: 10.4 mg/dL — ABNORMAL HIGH (ref 8.9–10.3)
Chloride: 96 mmol/L — ABNORMAL LOW (ref 98–111)
Creatinine, Ser: 1.69 mg/dL — ABNORMAL HIGH (ref 0.44–1.00)
GFR, Estimated: 33 mL/min — ABNORMAL LOW (ref >60)
Glucose, Bld: 133 mg/dL — ABNORMAL HIGH (ref 70–99)
Potassium: 4.5 mmol/L (ref 3.5–5.1)
Sodium: 134 mmol/L — ABNORMAL LOW (ref 135–145)

## 2022-01-21 LAB — GLUCOSE, CAPILLARY
Glucose-Capillary: 161 mg/dL — ABNORMAL HIGH (ref 70–99)
Glucose-Capillary: 132 mg/dL — ABNORMAL HIGH (ref 70–99)
Glucose-Capillary: 127 mg/dL — ABNORMAL HIGH (ref 70–99)
Glucose-Capillary: 120 mg/dL — ABNORMAL HIGH (ref 70–99)

## 2022-01-21 NOTE — Progress Notes (Signed)
Pt has home CPAP.  

## 2022-01-21 NOTE — Progress Notes (Signed)
Nutrition Education Note  RD consulted for diet education. Patient received diet education during her acute hospitalization. She remembers RD visiting her and discussing recommendations for diabetes and heart healthy diet. Education handouts are in patient's rehab notebook. She has had diabetes for 13 years. Over the past winter she was closely following a diet and had lost 35 lbs. She hopes to continue her weight loss journey after discharge home from rehab.   Lab Results  Component Value Date   HGBA1C 7.1 (H) 01/07/2022    Discussed different food groups and their effects on blood sugar, emphasizing carbohydrate-containing foods. Discussed importance of controlled and consistent carbohydrate intake throughout the day. Provided examples of ways to balance meals/snacks and encouraged intake of high-fiber, whole grain complex carbohydrates. Provided examples on ways to decrease sodium and fat intake in diet. Discouraged intake of processed foods and use of salt shaker. Encouraged fresh fruits and vegetables as well as whole grain sources of carbohydrates to maximize fiber intake. Teach back method used.  Expect good compliance.  Body mass index is 39.6 kg/m. Pt meets criteria for obesity based on current BMI.  Current diet order is heart healthy carbohydrate modified, patient is consuming approximately 100% of meals at this time. Labs and medications reviewed. No further nutrition interventions warranted at this time. RD contact information provided. If additional nutrition issues arise, please re-consult RD.  Lucas Mallow RD, LDN, CNSC Please refer to Amion for contact information.

## 2022-01-21 NOTE — Progress Notes (Signed)
Physical Therapy Session Note  Patient Details  Name: Shelby Morgan MRN: [id number] Date of Birth: 1956/03/31  Today's Date: 01/21/2022 PT Individual Time: [phone] and 8938-1017 PT Individual Time Calculation (min): 57 min and 28 min  Short Term Goals: Week 1:  PT Short Term Goal 1 (Week 1): Pt will perform supine<>sit with supervision PT Short Term Goal 2 (Week 1): Pt will perform sit<>stands using LRAD with CGA PT Short Term Goal 3 (Week 1): Pt will perform stand pivot transfers using LRAD with CGA PT Short Term Goal 4 (Week 1): Pt will ambulate at least 1f using LRAD with CGA PT Short Term Goal 5 (Week 1): Pt will ascend/descend 4 steps using HRs with CGA  Skilled Therapeutic Interventions/Progress Updates:    Session 1: Pt received sitting in w/c with nurse present and pt getting ready for her day. Pt agreeable to therapy session and requesting to change out of her nightgown.   Sitting in w/c pt performed the following:  - Doffed nightgown without assist - donned bra with front clasp with increased time to get it around her arms and fasten clasp (misaligns the clasps) and min assist to position correctly - donned shirt with set-up assist; encouragement throughout to use L UE grasp to assist with this - threaded on pants requiring question cuing to recall to place LLE in first - donned shoes min assist  Sit>stand w/c>R UE support on footboard of bed with CGA and then light min assist for balance while pulling pants up over hips with cuing for L hand involvement - continues to place whole L hand into pants to pull them up rather than grasping onto waist of the pants due to pt reporting poor grip strength (feel it is more due to pt unable to feel when she has a grasp of her pants).   Sitting in w/c at sink used electric razor to shave face with cuing to use R UE only for safety and pt demos great understanding.   Gait training ~1516fto main therapy gym using RW with light min  assist for balance - starts with step-to pattern leading with L LE having a more stiff/extended L LE position during swing but progressed to reciprocal pattern with more L knee flexion during swing - does notice to lean forward onto RW with pt reporting arm fatigue towards end of walk.   Pt reports she feels overall "stiff" this AM.  Dynamic standing balance task of throwing ball into rebounder with wide BOS progressed to normal stance BOS with CGA/light min assist for steadying - good hand coordination with this, no instances of dropping the ball.  Gait training ~65f31folding the ball to put it away with L HHA and min assist for balance - has more rigid L LE posturing during swing again with this likely due to fear of imbalance.   Gait training ~53f75f to/from stairs using RW progressing towards CGA for safety - improving L LE knee flexion during swing although continues to have longer L step length compared to R.  Stair navigation training ascending/descending 4 steps x2 (6" height) using only R HR to progress towards the STE home (2 STE her mother's house with storm door on R side) using HHA on the opposite side - requires heavier min assist for balance - continues to use step-to pattern leading with L LE in both directions.  Gait training using RW back to room with CGA for steadying/safety - continues to have longer R LE  stance time with slow forward stepping with LE although ends up with longer L LE step length causing unsymmetrical gait pattern. At end of session, pt left seated in w/c with needs in reach and seat belt alarm on.  Session 2: Pt received sitting in w/c and agreeable to therapy session. Pt wearing B LE knee high TED hose and tennis shoes. Sit>stand w/c>RW with CGA progressing towards close supervision. Gait training ~130f to main therapy gym using RW with CGA for steadying - more quickly progresses to reciprocal stepping pattern this afternoon but continues to have prolonged R  stance time with slow L LE forward swing phase progression with slightly too large L LE step length compared to R LE - cuing for increased symmetry with slight improvement (pt does well to carry over prior therapist's cuing to step each foot in a tile square) - also has delayed forward progression of AD during R stance.   Gait training ~141fusing L HHA with light min assist for balance and pt demos improved symmetry in gait though still slightly larger L LE step length - continues to have heavy L heel landing on initial contact due to impaired sensation as well as possibly slightly excessive L LE adduction causing narrow BOS.   Gait training ~4517f2 to/from stairs, no UE support, with min assist for balance due to intermittent lateral LOB - continuing to work on symmetrical gait mechanics with continuing improvement.  Stair navigation training ascending/descending 4 steps x2 (6" height) using R HR only and HHA on opposite side with step-to pattern leading with L LE in each direction to progress towards 2 STE house with only storm door support on the R side - pt continues to have more impaired control on descent with 1x minor posterior LOB requiring heavier min assist  to maintain upright. Discussed with pt, having family install handrail on L side.   Gait training back to room using RW with CGA for steadying balance and therapist facilitating continuous forward movement of AD targeting more fluid, symmetrical gait with improvement noted, intermittent verbal cuing for smaller L LE step length.  At end of session, pt left seated in w/c with needs in reach, seat belt alarm on, and nurse present to assume care of pt.   Therapy Documentation Precautions:  Precautions Precautions: Fall, Other (comment) Precaution Comments: L hemiparesis with severely impaired sensation Restrictions Weight Bearing Restrictions: No LLE Weight Bearing: Weight bearing as tolerated   Pain:  Session 1: Nurse reports  pt premedicated at beginning of session and pt reporting pain manageable during session; although, now stating L great toe is more painful today (gout) with pt reporting an improvement in her R heel spur/Achille's pain.  Session 2: Reports pain in L foot rated as 3/10 states she was pre-medicated - states her shoes are putting some pressure on her L great toe - pt's family should be bringing her different shoes tomorrow with more space around her toes.   Therapy/Group: Individual Therapy  CarTawana ScalePT, DPT, NCS, CSRS 01/21/2022, 7:49 AM

## 2022-01-21 NOTE — Progress Notes (Signed)
Patient ID: Shelby Morgan, female   DOB: [birth year], 66 y.o.   MRN: [id number]  Team Conference Report to Patient/Family  Team Conference discussion was reviewed with the patient and caregiver, including goals, any changes in plan of care and target discharge date.  Patient and caregiver express understanding and are in agreement.  The patient has a target discharge date of 01/29/22.  Sw met with patient and provided team conference updates. Patient pleased about therapies so far. Still anticipating discharging home with her mothers but LT goal to return to her place. No additional questions or concerns. Sw will continue to follow up with recommendations as they are added.  Dyanne Iha 01/21/2022, 1:52 PM

## 2022-01-21 NOTE — Progress Notes (Signed)
PROGRESS NOTE   Subjective/Complaints:  Left great toe sore when donning shoes, able to ambulate  ROS- neg CP, SOB, N/V/D, Fevers, chills  Objective:   DG Foot 2 Views Left  Result Date: 01/20/2022 CLINICAL DATA:  Left foot pain and redness. Bump on a table 3 weeks ago. EXAM: LEFT FOOT - 2 VIEW COMPARISON:  Left foot x-rays dated August 18, 2007. FINDINGS: No acute fracture or dislocation. Mild tibiotalar and midfoot osteoarthritis. Large Achilles and plantar calcaneal enthesophytes. Bone mineralization is normal. Mild diffuse soft tissue swelling. IMPRESSION: 1. Mild diffuse soft tissue swelling. No acute osseous abnormality. Electronically Signed   By: Titus Dubin M.D.   On: 01/20/2022 10:15   DG Foot 2 Views Right  Result Date: 01/19/2022 CLINICAL DATA:  [id number] EXAM: RIGHT FOOT - 2 VIEW COMPARISON:  06/03/2016 FINDINGS: Distant ORIF of distal fibular and medial malleolar fractures. Osteoarthritis of the ankle joint with joint space narrowing and marginal osteophytes. Calcaneal spurs are present. There is osteoarthritis of the midfoot. No evidence of regional fracture or focal bone lesion. Degenerative changes have worsened since 2017. 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 No acute finding. Electronically Signed   By: Nelson Chimes M.D.   On: 01/19/2022 13:17   Recent Labs    01/19/22 0632  WBC 8.7  HGB 11.0*  HCT 34.8*  PLT 409*    No results for input(s): "NA", "K", "CL", "CO2", "GLUCOSE", "BUN", "CREATININE", "CALCIUM" in the last 72 hours.   Intake/Output Summary (Last 24 hours) at 01/21/2022 0846 Last data filed at 01/21/2022 0800 Gross per 24 hour  Intake 654 ml  Output --  Net 654 ml         Physical Exam: Vital Signs Blood pressure 135/68, pulse 71, temperature 97.9 F (36.6 C), resp. rate 19, height '5\' 2"'$  (1.575 m), weight 98.2 kg, SpO2 99 %.   General:  No acute distress Mood and affect are appropriate Heart: Regular rate and rhythm no rubs murmurs or extra sounds Lungs: Clear to auscultation, breathing unlabored, no rales or wheezes Abdomen: Positive bowel sounds, soft nontender to palpation, nondistended Extremities: No clubbing, cyanosis, or edema Skin: No evidence of breakdown, no evidence of rash Neurologic: Cranial nerves II through XII intact, motor strength is 4/5 in right and 3- left deltoid, bicep, tricep, grip,5/5 RLE, 4/5 LLE  Sensory exam normal sensation to light touch and proprioception in RIght upper absent proprio and reduced LT sensation LUE Cerebellar exam normal finger to nose to finger as well as heel to shin in bilateral upper and lower extremities Musculoskeletal: Full range of motion in all 4 extremities. No joint swelling, pain with passive dorsiflexion RIght ankle  Ecchymosis R 1st MTP joint, no redness or warmth   Assessment/Plan: 1. Functional deficits which require 3+ hours per day of interdisciplinary therapy in a comprehensive inpatient rehab setting. Physiatrist is providing close team supervision and 24 hour management of active medical problems listed below. Physiatrist and rehab team continue to assess barriers to discharge/monitor patient progress toward functional and medical goals  Care Tool:  Bathing    Body parts bathed by patient: Right arm, Left  arm, Chest, Abdomen, Front perineal area, Buttocks, Right upper leg, Left upper leg, Right lower leg, Face     Body parts n/a: Left lower leg, Right upper leg, Left upper leg, Right lower leg, Buttocks, Front perineal area   Bathing assist Assist Level: Minimal Assistance - Patient > 75%     Upper Body Dressing/Undressing Upper body dressing   What is the patient wearing?: Bra, Pull over shirt    Upper body assist Assist Level: Minimal Assistance - Patient > 75%    Lower Body Dressing/Undressing Lower body dressing      What is the patient  wearing?: Underwear/pull up, Pants     Lower body assist Assist for lower body dressing: Minimal Assistance - Patient > 75%     Toileting Toileting Toileting Activity did not occur (Clothing management and hygiene only): N/A (no void or bm)  Toileting assist Assist for toileting: Moderate Assistance - Patient 50 - 74%     Transfers Chair/bed transfer  Transfers assist     Chair/bed transfer assist level: Minimal Assistance - Patient > 75%     Locomotion Ambulation   Ambulation assist   Ambulation activity did not occur: Safety/medical concerns  Assist level: Minimal Assistance - Patient > 75% Assistive device: Walker-rolling Max distance: 66f   Walk 10 feet activity   Assist     Assist level: Minimal Assistance - Patient > 75% Assistive device: Walker-rolling   Walk 50 feet activity   Assist Walk 50 feet with 2 turns activity did not occur: Safety/medical concerns  Assist level: Minimal Assistance - Patient > 75% Assistive device: Walker-rolling    Walk 150 feet activity   Assist Walk 150 feet activity did not occur: Safety/medical concerns         Walk 10 feet on uneven surface  activity   Assist Walk 10 feet on uneven surfaces activity did not occur: Safety/medical concerns         Wheelchair     Assist Is the patient using a wheelchair?: Yes (for transportation) Type of Wheelchair: Manual    Wheelchair assist level: Dependent - Patient 0%      Wheelchair 50 feet with 2 turns activity    Assist        Assist Level: Dependent - Patient 0%   Wheelchair 150 feet activity     Assist      Assist Level: Dependent - Patient 0%   Blood pressure 135/68, pulse 71, temperature 97.9 F (36.6 C), resp. rate 19, height '5\' 2"'$  (1.575 m), weight 98.2 kg, SpO2 99 %.  Medical Problem List and Plan: 1. 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               -patient may shower             -ELOS/Goals: 10-12 days             -Cont CIR PT, OT 2.  Antithrombotics: -DVT/anticoagulation:  Pharmaceutical: Lovenox             -antiplatelet therapy: DAPT X 3 weeks followed by plavix alone.  3. Pain Management: tylenol prn.  RIght achilles pain will add voltaren gel and night splint 4. Mood/Sleep: LCSW to follow for evaluation and support.              -antipsychotic agents: N/A 5. Neuropsych/cognition: This patient is capable of making decisions on her own behalf. 6. Skin/Wound Care: Routine  pressure relief measures.  7. Fluids/Electrolytes/Nutrition: Monitor I/O. Check CMET in am.  8. T2DM: Hgb A1c- 7.1. Was on Januvia 100/Metform 1000 mg BID PTA.  --Monitor BS ac/hs and use SSI for elevated BS.              CBG (last 3)  Recent Labs    01/20/22 1609 01/20/22 2025 01/21/22 0543  GLUCAP 140* 132* 161*   Controlled metformin and tradjenta -6/28 Well controlled, 9. HTN: Monitor BP TID--continue Lisinopril and Lasix             -BP goal <180/105            Vitals:   01/20/22 1917 01/21/22 0502  BP: 111/61 135/68  Pulse: 81 71  Resp: 18 19  Temp: 98.4 F (36.9 C) 97.9 F (36.6 C)  SpO2: 96% 99%  6/28 well controlled   10. Non-alcoholic fatty liver disease: Stable. Avoid hepatotoxic medications.             --LFTs WNL. 11. H/o Melanoma/Persistent Mediastinal adenopathy: Per records review--was evaluated by Dr. Delton Coombes with repeat CT chest recommended but no follow up noted in records.  12. Lumbar stenosis with RLE radiculopathy: ESI by Dr. Ernestina Patches effective in the past.             --now with discomfort due to lack of activity.  --monitor for now. Tramadol prn added.   13. Sleep smart study/OSA: Has been compliant with CPAP use.  14. Low grade fever/Leucocytosis:   Resolving. Monitor for signs of infection. 15. AKI: SCr up likely due to addition of ACE/Better BP control -Encourage oral fluids 16. Left parotid nodule:  Follow up with ENT after discharge.            -- 17. 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  --Will start low dose gabapentin. Increase to '100mg'$  TID- pt feels like it was more effective at that dose  -6/26 Xray R foot showing post traumatic arthritis s/p ankle fusion, also right navicular spurring CHeck Left foot xray for 1st MTP erythema and tenderness, elevated urate , start voltaren if worsens may need prednisone   18.  Left eye mildly injected with hx of Left sided numness may be irritated from inadvertant contact will add wetting agent  19. Anemia HGB 11  -possibly dilutional?,  follow  LOS: 7 days A FACE TO FACE EVALUATION WAS PERFORMED  Charlett Blake 01/21/2022, 8:46 AM

## 2022-01-21 NOTE — Patient Care Conference (Cosign Needed Addendum)
Inpatient RehabilitationTeam Conference and Plan of Care Update Date: 01/21/2022   Time: 10:53 AM    Patient Name: Shelby Morgan      Medical Record Number: [id number]  Date of Birth: 02-Feb-1956 Sex: Female         Room/Bed: 4W18C/4W18C-01 Payor Info: Payor: [employer] MEDICARE / Plan: [place] MEDICARE HMO/PPO / Product Type: *No Product type* /    Admit Date/Time:  01/14/2022  9:40 PM  Primary Diagnosis:  Right thalamic stroke [facility])  Hospital Problems: Principal Problem:   Right thalamic stroke [facility]) Active Problems:   Primary hypertension   Diabetes mellitus ([place])   AKI (acute kidney injury) [facility])    Expected Discharge Date: Expected Discharge Date: 01/29/22  Team Members Present: Physician leading conference: Dr. Alysia Penna Social Worker Present: Erlene Quan, [place] Nurse Present: Dorien Chihuahua, RN PT Present: Page Spiro, PT OT Present: Willeen Cass, Ludwig Lean, COTA SLP Present: Sherren Kerns, SLP PPS Coordinator present : Ileana Ladd, PT     Current Status/Progress Goal Weekly Team Focus  Bowel/Bladder   continent of bowel & bladder, LBM 6/23, laxative given  remain continent  assist as needed and monitor   Swallow/Nutrition/ Hydration             ADL's   MIN A for bathing, MIN A for UB/ LB dressing, MOD A for 3/3 toileting tasks, MINA for transfer with RW. continues to present with impaired sensation/ [place] in L UE, can feel temperature, felt medium pressure in thumb  MOD I - supervision  balance training, functional mobility, increasing activity tolerance, LUE [place]   Mobility   min assist supine<>sit, CGA sit<>stands using RW, CGA/min assist stand pivot transfers using RW, CGA/min assist gait up to 146f using RW with consistent min assist when not using AD, light min assist 8 stair navigation using B HRs  supervision overall at ambulatory level  dynamic standing balance, L hemibody NMR, dynamic gait training, stair navigation training, pt  education, D/C planning, DME training   Communication             Safety/Cognition/ Behavioral Observations  NA - eval only         Pain   c/o pain 2/10 tonight, had tramadol & tylenol on earlier shift, uses ice pack & voltaren gel to the right heel, has decreased sensation to the left side.  pain scale <3/10  assess & treat as needed   Skin   has some scattered bruising, excoriation to the left abdomen, red area to the left lateral ball of foot thought to be gout flareup  no new areas of skin break down  assess q shift & prn     Discharge Planning:  Patient discharging home with mother able to provide supervision only. Ramped entrance   Team Discussion: Patient with chronic right hemisensory deficits and new left hemisensory deficits post right thalamic CVA.  Gout flair and diabetes.  Patient on target to meet rehab goals: yes, currently needs min assist for bathing and dressing, toileting and toilet transfers. Compensates well for left hand. Completes ambulation using a RW and completes steps with rails and min assist.   *See Care Plan and progress notes for long and short-term goals.   Revisions to Treatment Plan:  SLP eval and discharged   Teaching Needs: Safety, transfers, toileting, medications, secondary risk management, dietary modifications, etc  Current Barriers to Discharge: Decreased caregiver support and Home enviroment access/layout  Possible Resolutions to Barriers: Family education Would benefit from  handrails on steps to entry of home Ramp in back limited by grass covered sidewalk OP follow up services DME: RW, TTB     Medical Summary Current Status: gout LLE, chronic RIght sensory deficits with new left hemisensory deficits  Barriers to Discharge: Medical stability   Possible Resolutions to Barriers/Weekly Focus: treatment of gout, monitor CBG, may need adjustment of meds for neuropathic pain   Continued Need for Acute Rehabilitation Level of Care: The  patient requires daily medical management by a physician with specialized training in physical medicine and rehabilitation for the following reasons: Direction of a multidisciplinary physical rehabilitation program to maximize functional independence : Yes Medical management of patient stability for increased activity during participation in an intensive rehabilitation regime.: Yes Analysis of laboratory values and/or radiology reports with any subsequent need for medication adjustment and/or medical intervention. : Yes   I attest that I was present, lead the team conference, and concur with the assessment and plan of the team.   Dorien Chihuahua B 01/21/2022, 5:30 PM

## 2022-01-21 NOTE — Progress Notes (Signed)
Occupational Therapy Session Note  Patient Details  Name: Shelby Morgan MRN: [id number] Date of Birth: 1956-04-11  Today's Date: 01/21/2022 OT Individual Time: 0950-1030 OT Individual Time Calculation (min): 40 min    Short Term Goals: Week 1:  OT Short Term Goal 1 (Week 1): Patient will bathe her upper body with min assistance using BUE OT Short Term Goal 2 (Week 1): Patient will bathe lower body with min assist using BUE OT Short Term Goal 3 (Week 1): Patient will transfer to toilet with min assist OT Short Term Goal 4 (Week 1): Patient will demonstrate sufficient dynamic stand tolerance and balance for lower body dressing, perianal care with min assist OT Short Term Goal 5 (Week 1): Patient will use BUE to cut up food or complete other bimanual tasks with cueing and supervision  Skilled Therapeutic Interventions/Progress Updates:  Pt greeted seated in w/c agreeable to OT intervention. Session focus on BADL reeducation, increasing activity tolerance, bilateral coordination tasks and LUE FMC. Pt requested to wash her hair although declined getting in the shower, utilized hair washing tray to increase overall OOB tolerance and improve activity tolerance. Pt able to assist with styling hair with blow dryer while OTA used blow dryer and pt using brush in BUEs. Reviewed compensatory methods for hair care during session for home.    pt left seated in w/c with alarm belt activated and all needs within reach.                        Therapy Documentation Precautions:  Precautions Precautions: Fall, Other (comment) Precaution Comments: L hemiparesis with severely impaired sensation Restrictions Weight Bearing Restrictions: No LLE Weight Bearing: Weight bearing as tolerated    Pain: Unrated pain reported in bilateral feet, elevated for pain mgmt during session    Therapy/Group: Individual Therapy  Corinne Ports [facility] 01/21/2022, 12:09 PM

## 2022-01-21 NOTE — Progress Notes (Signed)
Occupational Therapy Session Note  Patient Details  Name: Shelby Morgan MRN: [id number] Date of Birth: January 23, 1956  Today's Date: 01/21/2022 OT Individual Time: 1503-1530 OT Individual Time Calculation (min): 27 min    Short Term Goals: Week 1:  OT Short Term Goal 1 (Week 1): Patient will bathe her upper body with min assistance using BUE OT Short Term Goal 2 (Week 1): Patient will bathe lower body with min assist using BUE OT Short Term Goal 3 (Week 1): Patient will transfer to toilet with min assist OT Short Term Goal 4 (Week 1): Patient will demonstrate sufficient dynamic stand tolerance and balance for lower body dressing, perianal care with min assist OT Short Term Goal 5 (Week 1): Patient will use BUE to cut up food or complete other bimanual tasks with cueing and supervision  Skilled Therapeutic Interventions/Progress Updates:  Skilled OT intervention completed with focus on edema management, AE education and ADL retraining. Pt received seated in w/c attempting to donn shoes however c/o increased swelling and discomfort from it. 2+ pitting edema noted, with therapist retrieving knee high TEDS to assist with edema management. Pt agreeable to try them out, as well as increased elevation and ice tactics for further intervention. Therapist donned TEDS dependently secondary to discomfort and tight fit, with education provided about compression socks that can be worn that aren't medical grade, to increase efficiency with donning upon d/c. Educated pt on use of LH shoe horn for donning shoes, with pt able to return demonstrate donning of both shoes with use of shoe horn at supervision level with cues needed for technique. Handout provided for options online that she can purchase for use at home, as well as option for adaptive handle with wrist strap that is helpful with impaired sensation/gripping in the UE. Pt was left seated in w/c, with belt alarm on and all needs in reach at end of  session.   Therapy Documentation Precautions:  Precautions Precautions: Fall, Other (comment) Precaution Comments: L hemiparesis with severely impaired sensation Restrictions Weight Bearing Restrictions: No LLE Weight Bearing: Weight bearing as tolerated    Therapy/Group: Individual Therapy  Blase Mess, MS, OTR/L  01/21/2022, 7:46 AM

## 2022-01-21 NOTE — Progress Notes (Signed)
Physical Therapy Session Note  Patient Details  Name: Shelby Morgan MRN: [id number] Date of Birth: [birth year]  Today's Date: 01/21/2022 PT Individual Time: 6063-0160 PT Individual Time Calculation (min): 30 min   Short Term Goals: Week 1:  PT Short Term Goal 1 (Week 1): Pt will perform supine<>sit with supervision PT Short Term Goal 2 (Week 1): Pt will perform sit<>stands using LRAD with CGA PT Short Term Goal 3 (Week 1): Pt will perform stand pivot transfers using LRAD with CGA PT Short Term Goal 4 (Week 1): Pt will ambulate at least 18ft using LRAD with CGA PT Short Term Goal 5 (Week 1): Pt will ascend/descend 4 steps using HRs with CGA  Skilled Therapeutic Interventions/Progress Updates: Pt presented in w/c agreeable to therapy. Pt denies pain but states some discomfort on medial aspect of L foot, noted some redness distal to great toe. Shoe donned at beginning of session and doffed at end of session. Pt transported to rehab gym for time management. Pt participated in gait training with biofeedback for improved toe off, knee flexion, and decreased L step length. PTA tied theraband at pt's L ankle and pt ambulated in parallel bars with PTA providing resistance with theraband. Pt initially ambulation 44ft x 4 with decreased awareness of velocity of LLE. With verbal cues significantly improved and pt was able to ambulate (without resistance) HHA to hallway where pt ambulated with same biofeedback with wall rail ~73ft. Pt then provided with RW and ambulated CGA with improved carryover. To provide visual assitance pt ambulated in rehab gym with PTA placing colored targets ~every 47ft on LLE. Pt was able to ambulate and accutrately step on targets and then carry that over for additional 56ft. Pt returned to w/c and transported back to room. Pt left in room at end of session with belt alarm on, call bell within reach and needs met.      Therapy Documentation Precautions:  Precautions Precautions:  Fall, Other (comment) Precaution Comments: L hemiparesis with severely impaired sensation Restrictions Weight Bearing Restrictions: No LLE Weight Bearing: Weight bearing as tolerated General:   Vital Signs: Therapy Vitals Temp: 98.6 F (37 C) Temp Source: Oral Pulse Rate: 78 Resp: 16 BP: (!) 119/53 Patient Position (if appropriate): Lying Oxygen Therapy SpO2: 94 % O2 Device: Room Air Pain:   Mobility:   Locomotion :    Trunk/Postural Assessment :    Balance:   Exercises:   Other Treatments:      Therapy/Group: Individual Therapy  Aliciana Ricciardi 01/21/2022, 4:08 PM

## 2022-01-22 LAB — GLUCOSE, CAPILLARY
Glucose-Capillary: 142 mg/dL — ABNORMAL HIGH (ref 70–99)
Glucose-Capillary: 146 mg/dL — ABNORMAL HIGH (ref 70–99)
Glucose-Capillary: 236 mg/dL — ABNORMAL HIGH (ref 70–99)
Glucose-Capillary: 249 mg/dL — ABNORMAL HIGH (ref 70–99)

## 2022-01-22 MED ORDER — METHYLPREDNISOLONE 4 MG PO TBPK: 4.0000 mg | ORAL_TABLET | ORAL | Status: AC

## 2022-01-22 MED ORDER — METHYLPREDNISOLONE 4 MG PO TBPK
8.0000 mg | ORAL_TABLET | Freq: Every evening | ORAL | Status: AC
  Administered 2022-01-23: 8 mg via ORAL

## 2022-01-22 MED ORDER — METHYLPREDNISOLONE 4 MG PO TBPK
8.0000 mg | ORAL_TABLET | Freq: Every morning | ORAL | Status: AC
  Administered 2022-01-22: 8 mg via ORAL
  Filled 2022-01-22: qty 21

## 2022-01-22 MED ORDER — METHYLPREDNISOLONE 4 MG PO TBPK
4.0000 mg | ORAL_TABLET | Freq: Four times a day (QID) | ORAL | Status: AC
  Administered 2022-01-24 – 2022-01-27 (×10): 4 mg via ORAL

## 2022-01-22 MED ORDER — METHYLPREDNISOLONE 4 MG PO TBPK: 8.0000 mg | ORAL_TABLET | Freq: Every evening | ORAL | Status: AC

## 2022-01-22 MED ORDER — METHYLPREDNISOLONE 4 MG PO TBPK
4.0000 mg | ORAL_TABLET | Freq: Three times a day (TID) | ORAL | Status: AC
  Administered 2022-01-23 (×3): 4 mg via ORAL

## 2022-01-22 NOTE — Progress Notes (Signed)
Occupational Therapy Weekly Progress Note  Patient Details  Name: Shelby Morgan MRN: [id number] Date of Birth: [birth year]  Beginning of progress report period: January 15, 2022 End of progress report period: January 22, 2022  Today's Date: 01/22/2022  Patient has met 4 of 4 short term goals. Pt completes bathing with overall MIN A, MIN A for UB/LB dressing via sit>stand with Rw, pt completes 3/3 toileting tasks with MOD A, and MIN A for ambulatory transfers with RW. Pt continues to present with impaired balance, impaired [place] in LUE, decreased activity tolerance, and generalized weakness. Continue with POC.   Patient continues to demonstrate the following deficits: muscle weakness, decreased cardiorespiratoy endurance, impaired timing and sequencing, unbalanced muscle activation, and decreased coordination, and decreased standing balance, decreased postural control, hemiplegia, and decreased balance strategies and therefore will continue to benefit from skilled OT intervention to enhance overall performance with BADL and Reduce care partner burden.  Patient progressing toward long term goals..  Continue plan of care.  OT Short Term Goals Week 1:  OT Short Term Goal 1 (Week 1): Patient will bathe her upper body with min assistance using BUE OT Short Term Goal 1 - Progress (Week 1): Met OT Short Term Goal 2 (Week 1): Patient will bathe lower body with min assist using BUE OT Short Term Goal 2 - Progress (Week 1): Met OT Short Term Goal 3 (Week 1): Patient will transfer to toilet with min assist OT Short Term Goal 3 - Progress (Week 1): Met OT Short Term Goal 4 (Week 1): Patient will demonstrate sufficient dynamic stand tolerance and balance for lower body dressing, perianal care with min assist OT Short Term Goal 4 - Progress (Week 1): Met OT Short Term Goal 5 (Week 1): Patient will use BUE to cut up food or complete other bimanual tasks with cueing and supervision OT Short Term Goal 5 - Progress  (Week 1): Met Week 2:  OT Short Term Goal 1 (Week 2): STG= LTG   Therapy Documentation Precautions:  Precautions Precautions: Fall, Other (comment) Precaution Comments: L hemiparesis with severely impaired sensation Restrictions Weight Bearing Restrictions: No LLE Weight Bearing: Weight bearing as tolerated   Therapy/Group: Individual Therapy  Corinne Ports [facility] 01/22/2022, 7:58 AM

## 2022-01-22 NOTE — Progress Notes (Signed)
Occupational Therapy Session Note  Patient Details  Name: Shelby Morgan MRN: [id number] Date of Birth: 29-Oct-1955  Today's Date: 01/22/2022 OT Individual Time: 1003-1102 session 1 OT Individual Time Calculation (min): 59 min  Session 2: 1434-1530   Short Term Goals: Week 1:  OT Short Term Goal 1 (Week 1): Patient will bathe her upper body with min assistance using BUE OT Short Term Goal 1 - Progress (Week 1): Met OT Short Term Goal 2 (Week 1): Patient will bathe lower body with min assist using BUE OT Short Term Goal 2 - Progress (Week 1): Met OT Short Term Goal 3 (Week 1): Patient will transfer to toilet with min assist OT Short Term Goal 3 - Progress (Week 1): Met OT Short Term Goal 4 (Week 1): Patient will demonstrate sufficient dynamic stand tolerance and balance for lower body dressing, perianal care with min assist OT Short Term Goal 4 - Progress (Week 1): Met OT Short Term Goal 5 (Week 1): Patient will use BUE to cut up food or complete other bimanual tasks with cueing and supervision OT Short Term Goal 5 - Progress (Week 1): Met  Skilled Therapeutic Interventions/Progress Updates:  Session 1: Pt greeted seated in w/c at sink completing oral care. pt  agreeable to OT intervention however pt reports gout in L foot unable to stand on L foot, therefore session conducted from w/c level. Total A transport to gym from w/c. Pt completed below therapeutic activities to work on RUE [facility] and improved proprioception:                  -Using kitchen tongs to retrieve plastic fruits to facilitate improved composite flexion/extension, pt even able to pick up small beads with tongs with supervision  - using spatula to flip bean bags to facilitate improved wrist pronation/supination, pt completed x10 reps. Graded task up and had pt flip smaller, thinner discs with spatula x10 reps  - pt completed peg board task to work on RUE proprioception and [place], noted mild ataxia in hand when attempting to  place pegs in particular spot, education provided on stabilize elbow/wrist to accommodate for ataxic movements during [facility]. Education also provided on modifying pts environment to accommodate for  impaired [facility] such as turning peg board as needed during task - engaging in Jaconita game to provided education on using strategies for impaired sensation in LUE, education provided on using visual vs sensation to determine whether or not block is able to be removed from tower. Pt completed task with good carryover of education noted to stabilize elbow when reaching for blocks, able to complete task with overall supervision  Total A transport back to room where pt was able to stand pivot to toilet from w/c with use of grab bar and CGA. Pt completed 3/3 toileting tasks with CGA, + urine void. Pt completed stand pivot back to bed with Rw and CGA. Pt left supine in bed with bed alarm activated and all needs within reach.   Session 2: Pt greeted seated in w/c agreeable to OT intervention.pt continues to report pain in L foot declining to stand on it. Pt transported to day room from w/c to work on various therapeutic activities focused on LUE [place]:   - worked on Estate agent using 1 inch puzzle pieces to create puzzle from visual aid with an emphasis on pincer grasp and bimanual motor planning. Pt completed task with + time and effort but overall supervision assist. - threading beads using LUE  to thread and RUE to hold string, pt had the most difficulty with smaller beads but able to grasp the larger beads with supervision. Education provided on importance of learning compensatory methods for tasks. - flipping a deck of cards and organizing cards based on suit with an emphasis on in hand manipulation skills, pt using lateral pinch to retrieve cards and rotation to flip card with LUE. Pt completed task with + time and supervision  Pt completed below therex to increase BUE strength and provide proprioception to LUE:   - x20 chest presses with 2.2 lb weighted ball -x20 wrist supination/pronation with 2.2 weighted ball  -x20 shoulder flexion with 2.2 lb weighted ball -x12 scapular protaction/retraction with 2.2 lb weighted ball     Pt transported back to room in w/c with total A where pt left seated in w/c with alarm belt activated and all needs within reach.                 Therapy Documentation Precautions:  Precautions Precautions: Fall, Other (comment) Precaution Comments: L hemiparesis with severely impaired sensation Restrictions Weight Bearing Restrictions: No LLE Weight Bearing: Weight bearing as tolerated   Pain:unrated pain reported in L foot from gout during both sessions, rest breaks, elevation and repositioning provided as needed.     Therapy/Group: Individual Therapy  [name] 01/22/2022, 12:17 PM

## 2022-01-22 NOTE — Evaluation (Signed)
Recreational Therapy Assessment and Plan  Patient Details  Name: Shelby Morgan MRN: [id number] Date of Birth: [birth year] Today's Date: 01/22/2022  Rehab Potential:  Good ELOS:   d/c 7/6  Assessment Hospital Problem: Principal Problem:   Right thalamic stroke [facility]) Active Problems:   Primary hypertension   Diabetes mellitus ([place])   AKI (acute kidney injury) ([place])     Past Medical History:      Past Medical History:  Diagnosis Date   Anxiety     Bell's palsy 01/08/2011   Chronic back pain     Depression     Diabetes mellitus     Diverticulosis     GERD (gastroesophageal reflux disease)     Hemorrhoids     Hepatomegaly     Hypertension     Lumbar stenosis      L2/L3--followed by Dr. Ernestina Patches   Melanoma [facility]) 2009    Dr [place] Crane, Stage 2, required only surgery   NAFLD (nonalcoholic fatty liver disease)     Skin cancer     Stroke ([place]) 07/2021    Past Surgical History:       Past Surgical History:  Procedure Laterality Date   CESAREAN SECTION       COLONOSCOPY   03/09/2011    Dr Oneida Alar diverticulosis, internal and external hemorrhoids   ESOPHAGOGASTRODUODENOSCOPY   03/09/2011    Esophageal stricture dilated to 16 MM savory, NSAID- induced gastritis and duodenitis   LEG SURGERY        plates/screws/hx fx-right leg   MELANOMA EXCISION        right knee   SAVORY DILATION   03/09/2011      Assessment & Plan Clinical Impression: Patient is a 66 y.o. female with history of HTN, NAFLD, HTN, T2DM, morbid obesity--BMI 41.9, melanoma (Dr. [place] Crane), anxiety d/o, CVA 09/22; who was admitted via [facility] on 01/06/22 with sudden onset of severe numbness left face/arm/leg and weakness. CT head negative and TNK administered and she was transferred to [facility] for management.  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 MRI brain showed right thalamocapsular infarct with mild to moderate chronic microvascular changes.  Patient transferred to CIR on 01/14/2022 .     Pt presents with decreased activity tolerance, decreased functional mobility, decreased balance, decreased coordination, feelings of stress Limiting pt's independence with leisure/community pursuits.  Met with pt today to discuss TR services including leisure education, activity analysis/modifications and stress management.  Also discussed the importance of social, emotional, spiritual health in addition to physical health and their effects on overall health and wellness.  Pt stated understanding.   Plan   Min 1 TR session >20 minutes during LOS Recommendations for other services: None   Discharge Criteria: Patient will be discharged from TR if patient refuses treatment 3 consecutive times without medical reason.  If treatment goals not met, if there is a change in medical status, if patient makes no progress towards goals or if patient is discharged from hospital.  The above assessment, treatment plan, treatment alternatives and goals were discussed and mutually agreed upon: by patient  [place] 01/22/2022, 3:27 PM

## 2022-01-22 NOTE — Progress Notes (Signed)
PROGRESS NOTE   Subjective/Complaints:  Increased Left great toe pain  ROS- neg CP, SOB, N/V/D, Fevers, chills  Objective:   DG Foot 2 Views Left  Result Date: 01/20/2022 CLINICAL DATA:  Left foot pain and redness. Bump on a table 3 weeks ago. EXAM: LEFT FOOT - 2 VIEW COMPARISON:  Left foot x-rays dated August 18, 2007. FINDINGS: No acute fracture or dislocation. Mild tibiotalar and midfoot osteoarthritis. Large Achilles and plantar calcaneal enthesophytes. Bone mineralization is normal. Mild diffuse soft tissue swelling. IMPRESSION: 1. Mild diffuse soft tissue swelling. No acute osseous abnormality. Electronically Signed   By: Titus Dubin M.D.   On: 01/20/2022 10:15   No results for input(s): "WBC", "HGB", "HCT", "PLT" in the last 72 hours.  Recent Labs    01/21/22 2145  NA 134*  K 4.5  CL 96*  CO2 25  GLUCOSE 133*  BUN 35*  CREATININE 1.69*  CALCIUM 10.4*     Intake/Output Summary (Last 24 hours) at 01/22/2022 0759 Last data filed at 01/21/2022 1835 Gross per 24 hour  Intake 790 ml  Output --  Net 790 ml         Physical Exam: Vital Signs Blood pressure (!) 142/61, pulse 83, temperature 98.5 F (36.9 C), resp. rate 19, height '5\' 2"'$  (1.575 m), weight 98.2 kg, SpO2 97 %.   General: No acute distress Mood and affect are appropriate Heart: Regular rate and rhythm no rubs murmurs or extra sounds Lungs: Clear to auscultation, breathing unlabored, no rales or wheezes Abdomen: Positive bowel sounds, soft nontender to palpation, nondistended Extremities: No clubbing, cyanosis, or edema Skin: No evidence of breakdown, no evidence of rash Neurologic: Cranial nerves II through XII intact, motor strength is 4/5 in right and 3- left deltoid, bicep, tricep, grip,5/5 RLE, 4/5 LLE  Sensory exam normal sensation to light touch and proprioception in RIght upper absent proprio and reduced LT sensation LUE Cerebellar  exam normal finger to nose to finger as well as heel to shin in bilateral upper and lower extremities Musculoskeletal: Full range of motion in all 4 extremities. No joint swelling, pain with passive dorsiflexion RIght ankle  Ecchymosis R 1st MTP joint, no redness or warmth   Assessment/Plan: 1. Functional deficits which require 3+ hours per day of interdisciplinary therapy in a comprehensive inpatient rehab setting. Physiatrist is providing close team supervision and 24 hour management of active medical problems listed below. Physiatrist and rehab team continue to assess barriers to discharge/monitor patient progress toward functional and medical goals  Care Tool:  Bathing    Body parts bathed by patient: Right arm, Left arm, Chest, Abdomen, Front perineal area, Buttocks, Right upper leg, Left upper leg, Right lower leg, Face     Body parts n/a: Left lower leg, Right upper leg, Left upper leg, Right lower leg, Buttocks, Front perineal area   Bathing assist Assist Level: Minimal Assistance - Patient > 75%     Upper Body Dressing/Undressing Upper body dressing   What is the patient wearing?: Bra, Pull over shirt    Upper body assist Assist Level: Minimal Assistance - Patient > 75%    Lower Body Dressing/Undressing Lower body dressing  What is the patient wearing?: Underwear/pull up, Pants     Lower body assist Assist for lower body dressing: Minimal Assistance - Patient > 75%     Toileting Toileting Toileting Activity did not occur (Clothing management and hygiene only): N/A (no void or bm)  Toileting assist Assist for toileting: Moderate Assistance - Patient 50 - 74%     Transfers Chair/bed transfer  Transfers assist     Chair/bed transfer assist level: Minimal Assistance - Patient > 75%     Locomotion Ambulation   Ambulation assist   Ambulation activity did not occur: Safety/medical concerns  Assist level: Minimal Assistance - Patient > 75% Assistive  device: Walker-rolling Max distance: 58f   Walk 10 feet activity   Assist     Assist level: Minimal Assistance - Patient > 75% Assistive device: Walker-rolling   Walk 50 feet activity   Assist Walk 50 feet with 2 turns activity did not occur: Safety/medical concerns  Assist level: Minimal Assistance - Patient > 75% Assistive device: Walker-rolling    Walk 150 feet activity   Assist Walk 150 feet activity did not occur: Safety/medical concerns         Walk 10 feet on uneven surface  activity   Assist Walk 10 feet on uneven surfaces activity did not occur: Safety/medical concerns         Wheelchair     Assist Is the patient using a wheelchair?: Yes (for transportation) Type of Wheelchair: Manual    Wheelchair assist level: Dependent - Patient 0%      Wheelchair 50 feet with 2 turns activity    Assist        Assist Level: Dependent - Patient 0%   Wheelchair 150 feet activity     Assist      Assist Level: Dependent - Patient 0%   Blood pressure (!) 142/61, pulse 83, temperature 98.5 F (36.9 C), resp. rate 19, height '5\' 2"'$  (1.575 m), weight 98.2 kg, SpO2 97 %.  Medical Problem List and Plan: 1. 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              -patient may shower             -ELOS/Goals: 10-12 days             -Cont CIR PT, OT 2.  Antithrombotics: -DVT/anticoagulation:  Pharmaceutical: Lovenox             -antiplatelet therapy: DAPT X 3 weeks followed by plavix alone.  3. Pain Management: tylenol prn.  RIght achilles pain will add voltaren gel and night splint Gout Left 1st MTP- start medrol dosepack  4. Mood/Sleep: LCSW to follow for evaluation and support.              -antipsychotic agents: N/A 5. Neuropsych/cognition: This patient is capable of making decisions on her own behalf. 6. Skin/Wound Care: Routine pressure relief measures.  7.  Fluids/Electrolytes/Nutrition: Monitor I/O. Check CMET in am.  8. T2DM: Hgb A1c- 7.1. Was on Januvia 100/Metform 1000 mg BID PTA.  --Monitor BS ac/hs and use SSI for elevated BS.              CBG (last 3)  Recent Labs    01/21/22 1630 01/21/22 2034 01/22/22 0533  GLUCAP 127* 120* 142*   Controlled metformin and tradjenta -6/28 Well controlled, 9. HTN: Monitor BP TID--continue Lisinopril and Lasix             -  BP goal <180/105            Vitals:   01/21/22 1910 01/22/22 0341  BP: 133/74 (!) 142/61  Pulse: 85 83  Resp: 18 19  Temp: 99.2 F (37.3 C) 98.5 F (36.9 C)  SpO2: 97% 97%  6/28 well controlled   10. Non-alcoholic fatty liver disease: Stable. Avoid hepatotoxic medications.             --LFTs WNL. 11. H/o Melanoma/Persistent Mediastinal adenopathy: Per records review--was evaluated by Dr. Delton Coombes with repeat CT chest recommended but no follow up noted in records.  12. Lumbar stenosis with RLE radiculopathy: ESI by Dr. Ernestina Patches effective in the past.             --now with discomfort due to lack of activity.  --monitor for now. Tramadol prn added.   13. Sleep smart study/OSA: Has been compliant with CPAP use.  14. Low grade fever/Leucocytosis:   Resolving. Monitor for signs of infection. 15. AKI: SCr up likely due to addition of ACE/Better BP control -Encourage oral fluids 16. Left parotid nodule: Follow up with ENT after discharge.            -- 17. 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  --Will start low dose gabapentin. 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  Left eye mildly injected with hx of Left sided numness may be irritated from inadvertant  contact will add wetting agent  19. Anemia HGB 11  -possibly dilutional?,  follow  LOS: 8 days A FACE TO FACE EVALUATION WAS PERFORMED  Charlett Blake 01/22/2022, 7:59 AM

## 2022-01-22 NOTE — Progress Notes (Signed)
Physical Therapy Weekly Progress Note  Patient Details  Name: Shelby Morgan MRN: [id number] Date of Birth: July 19, 1956  Beginning of progress report period: January 15, 2022 End of progress report period: January 22, 2022  Today's Date: 01/22/2022 PT Individual Time: 5374-8270 PT Individual Time Calculation (min): 84 min   Patient has met 5 of 5 short term goals. Shelby Morgan is progressing well with therapy despite experiencing pain in R heel spur and Achille's as well as recent development of gout in L great toe impacting pt's tolerance to [facility] activities. She is performing sit<>stand and stand pivot transfers using RW with CGA as well as ambulating up to 155ft using RW with CGA - she continues to have impaired L LE coordination during swing primarily due to impaired/absent sensation. She is navigating 8 steps using R HR and HHA on opposite side to simulate home entry with min assist for balance. She will benefit from continued CIR level skilled therapies prior to D/Cing home with 24hr support to her mother's house.  Patient continues to demonstrate the following deficits muscle weakness, decreased cardiorespiratoy endurance, impaired timing and sequencing, unbalanced muscle activation, and decreased coordination,  , and decreased standing balance, decreased postural control, and decreased balance strategies and therefore will continue to benefit from skilled PT intervention to increase functional independence with mobility.  Patient progressing toward long term goals..  Continue plan of care.  PT Short Term Goals Week 1:  PT Short Term Goal 1 (Week 1): Pt will perform supine<>sit with supervision PT Short Term Goal 1 - Progress (Week 1): Met PT Short Term Goal 2 (Week 1): Pt will perform sit<>stands using LRAD with CGA PT Short Term Goal 2 - Progress (Week 1): Met PT Short Term Goal 3 (Week 1): Pt will perform stand pivot transfers using LRAD with CGA PT Short Term Goal 3 - Progress (Week 1):  Met PT Short Term Goal 4 (Week 1): Pt will ambulate at least 78ft using LRAD with CGA PT Short Term Goal 4 - Progress (Week 1): Met PT Short Term Goal 5 (Week 1): Pt will ascend/descend 4 steps using HRs with CGA PT Short Term Goal 5 - Progress (Week 1): Met Week 2:  PT Short Term Goal 1 (Week 2): = to LTGs based on ELOS  Skilled Therapeutic Interventions/Progress Updates:  Ambulation/gait training;Community reintegration;DME/adaptive equipment instruction;Neuromuscular re-education;Psychosocial support;Stair training;UE/LE Strength taining/ROM;Balance/vestibular training;Discharge planning;Functional electrical stimulation;Pain management;Skin care/wound management;Therapeutic Activities;UE/LE Coordination activities;Cognitive remediation/compensation;Disease management/prevention;Splinting/orthotics;Patient/family education;Functional mobility training;Therapeutic Exercise;Visual/perceptual remediation/compensation;Wheelchair propulsion/positioning   Pt received sitting in w/c eating breakfast and upon seeing therapist pt says "shew, I'm under the weather today..my foot is killing me." (Referring to her L foot/great toe) States "I can't hardly stand up it hurts so bad." Despite this, pt agreeable to therapy session but requesting to perform seated therapy today due to the pain in her L foot preventing her from tolerating standing. Pt reports MD is aware of the pain and is planning to provide her medications. Pt also reports she is having R low back pain and premedicated.  Did not wear shoes today to avoid increased pressure on L great toe.   Transported to/from gym in w/c for time management and energy conservation.  Seated in w/c performed the following L UE NMR tasks  - opening and closing pill bottle containers - picking up medium to smallish sized "pills" (beads) with pt having significant difficulty coordinating L hand to grasp the pills using pincer technique and rather compensating by  bending her 2nd  finger and using lateral pinch instead - therapist tried to provide pt with dycem to hold pills in place while attempting to pick them up but still difficult - small colorful wooden blocks copying designs using LUE while also having pt "dig" the blocks out of the container with her L hand, which was challenging - greatest difficulty grasping the triangles  Throughout all of these pt has difficulty with force regulation and often causes the items to slip out of her hands or she has difficulty using pincer grasp to pick up objects in addition to her impaired sensation limiting the feedback all impairing her coordination.  Transported in w/c to ADL apartment to focus on L UE functional NMR including the following tasks (therapist relocated items to where she could grasp them from wheelchair level):  - collecting silverware out of the drawer 1 at a time (spoons, forks, and butter knifes) educated pt on not grasping sharp objects with her left hand - picking up 2 bowels, cups, and plates (plastic items) with pt having greatest difficulty with the roundness of the cups - then placing these items in dishwasher and educating pt on how to grasp the cups to make it easier to manage turning them up/down  Extensive education on safety not placing L hand near hot items and always checking surfaces with R hand first prior to touching them with L hand as well as always looking (using her eye sight) to see where her left hand is placed to ensure it is in a safe place.  Educated on need for walker bag and need to modify selection of drinks and modify how to transport items (using the counter) to move things in the kitchen/dining area because she needs both hands on RW.   Transported back to room and pt left seated in w/c with needs in reach and seat belt alarm on.   Therapy Documentation Precautions:  Precautions Precautions: Fall, Other (comment) Precaution Comments: L hemiparesis with severely  impaired sensation Restrictions Weight Bearing Restrictions: No LLE Weight Bearing: Weight bearing as tolerated   Pain:  Reports pain in L foot is 7/10 and states she received medications immediately prior to therapist arrival - modified therapy interventions for pain management. Reports R low back pain also - details above.   Therapy/Group: Individual Therapy  Tawana Scale , PT, DPT, NCS, CSRS 01/22/2022, 7:52 AM

## 2022-01-23 LAB — BASIC METABOLIC PANEL
Anion gap: 9 (ref 5–15)
BUN: 35 mg/dL — ABNORMAL HIGH (ref 8–23)
CO2: 24 mmol/L (ref 22–32)
Calcium: 10.3 mg/dL (ref 8.9–10.3)
Chloride: 101 mmol/L (ref 98–111)
Creatinine, Ser: 1.39 mg/dL — ABNORMAL HIGH (ref 0.44–1.00)
GFR, Estimated: 42 mL/min — ABNORMAL LOW (ref >60)
Glucose, Bld: 166 mg/dL — ABNORMAL HIGH (ref 70–99)
Potassium: 5.1 mmol/L (ref 3.5–5.1)
Sodium: 134 mmol/L — ABNORMAL LOW (ref 135–145)

## 2022-01-23 LAB — GLUCOSE, CAPILLARY
Glucose-Capillary: 167 mg/dL — ABNORMAL HIGH (ref 70–99)
Glucose-Capillary: 117 mg/dL — ABNORMAL HIGH (ref 70–99)
Glucose-Capillary: 177 mg/dL — ABNORMAL HIGH (ref 70–99)
Glucose-Capillary: 218 mg/dL — ABNORMAL HIGH (ref 70–99)

## 2022-01-23 NOTE — Progress Notes (Addendum)
Patient ID: AUGUSTA MIRKIN, female   DOB: 08-19-1955, 66 y.o.   MRN: [id number]  Sw made attempt to schedule family education with patient mother. Left detailed VM.  Sw received follow up from patient mother to schedule family education. Patient mother will like to discuss this weekend with family to arrange transportation and will call SW back on Monday to schedule.

## 2022-01-23 NOTE — Progress Notes (Signed)
Physical Therapy Session Note  Patient Details  Name: Shelby Morgan MRN: [id number] Date of Birth: May 08, 1956  Today's Date: 01/23/2022 PT Individual Time: 1305-1345 PT Individual Time Calculation (min): 40 min   Short Term Goals: Week 2:  PT Short Term Goal 1 (Week 2): = to LTGs based on ELOS  Skilled Therapeutic Interventions/Progress Updates:     Pt sitting in w/c to start  - ready to begin therapy. No reports of pain during treatment session.   Transported outside near [facility] to work on functional gait training outdoors on [employer] concrete surfaces. Sit<>Stand to RW with CGA. Ambulates >267f outdoors with CGA and RW - a few minor self corrected LOB, primarily occurring during non-linear movement or while navigating obstacles.  Returned upstairs to CIR to continue working on gPersonnel officer Using no AD, ambulated 4x1035fwith min/modA. Gait ataxic with inconsistent step lengths on L. Pt reporting inability to "feel" her L foot when it makes contact with the ground from sensory deficits. 4# and then 3# ankle weight to trial for improved awareness but limited improvement. Dynamic gait training with no AD forward/backward walking 4x1559fith modA - cues for increasing L hip/knee flexion to improve clearance and gait mechanics as she tends to keep LLE extended during swing phase.   Pt returned to room. Remained seated in w/c with all needs met, call bell in reach.    Therapy Documentation Precautions:  Precautions Precautions: Fall, Other (comment) Precaution Comments: L hemiparesis with severely impaired sensation Restrictions Weight Bearing Restrictions: No LLE Weight Bearing: Weight bearing as tolerated General:     Therapy/Group: Individual Therapy  ChrAlger Simons30/2023, 7:38 AM

## 2022-01-23 NOTE — Progress Notes (Signed)
Recreational Therapy Session Note  Patient Details  Name: Shelby Morgan MRN: [id number] Date of Birth: 1955/08/01 Today's Date: 01/23/2022  Pain: no c/o  Skilled Therapeutic Interventions/Progress Updates: Goals:   Pt will identify factors that contribute to stress.  MET Pt will identify at least 2 healthy coping strategies.  MET   Pt participated in stress managment/coping group today.  Pt education/discussion focused on stress exploration including factors that contribute to stress, factors that protect against stress and potential coping strategies.  Coping strategies included deep breathing, progressive muscle relaxation, imagery & challenging irrational thoughts.  Handouts provided.   Therapy/Group: Group Therapy   Stasia Somero 01/23/2022, 9:57 AM

## 2022-01-23 NOTE — Progress Notes (Signed)
Physical Therapy Session Note  Patient Details  Name: Shelby Morgan MRN: [id number] Date of Birth: October 24, 1955  Today's Date: 01/23/2022 PT Individual Time: 1430-1500 PT Individual Time Calculation (min): 30 min   Short Term Goals: Week 1:  PT Short Term Goal 1 (Week 1): Pt will perform supine<>sit with supervision PT Short Term Goal 1 - Progress (Week 1): Met PT Short Term Goal 2 (Week 1): Pt will perform sit<>stands using LRAD with CGA PT Short Term Goal 2 - Progress (Week 1): Met PT Short Term Goal 3 (Week 1): Pt will perform stand pivot transfers using LRAD with CGA PT Short Term Goal 3 - Progress (Week 1): Met PT Short Term Goal 4 (Week 1): Pt will ambulate at least 30ft using LRAD with CGA PT Short Term Goal 4 - Progress (Week 1): Met PT Short Term Goal 5 (Week 1): Pt will ascend/descend 4 steps using HRs with CGA PT Short Term Goal 5 - Progress (Week 1): Met  Skilled Therapeutic Interventions/Progress Updates:    Pt seated in w/c on arrival and agreeable to therapy. Pt reports 3/10 gout pain in L foot, premedicated. Rest and positioning provided as needed. Pt ambulated to therapy gym with RW, CGA. Noted antalgic gait pattern. Session focused on gait training without RW for improved balance and functional mobility. Forward and backward ambulation, 3 x 20 ft with seated rest breaks between laps. CGA with occ min A d/t pain/LOB. Pt then performed side stepping 3 x 20 ft laps with CGA and no LOB. Pt then ambulated back to room and returned to w/c, was left with all needs in reach and alarm active.    Therapy Documentation Precautions:  Precautions Precautions: Fall, Other (comment) Precaution Comments: L hemiparesis with severely impaired sensation Restrictions Weight Bearing Restrictions: No LLE Weight Bearing: Weight bearing as tolerated General:       Therapy/Group: Individual Therapy  Mickel Fuchs 01/23/2022, 2:44 PM

## 2022-01-23 NOTE — Progress Notes (Signed)
PROGRESS NOTE   Subjective/Complaints:  No issues , Left great toe pain improving   ROS- neg CP, SOB, N/V/D, Fevers, chills  Objective:   No results found. No results for input(s): "WBC", "HGB", "HCT", "PLT" in the last 72 hours.  Recent Labs    01/21/22 2145 01/23/22 0535  NA 134* 134*  K 4.5 5.1  CL 96* 101  CO2 25 24  GLUCOSE 133* 166*  BUN 35* 35*  CREATININE 1.69* 1.39*  CALCIUM 10.4* 10.3     Intake/Output Summary (Last 24 hours) at 01/23/2022 0907 Last data filed at 01/23/2022 9924 Gross per 24 hour  Intake 1300 ml  Output --  Net 1300 ml         Physical Exam: Vital Signs Blood pressure (!) 153/86, pulse 79, temperature 97.8 F (36.6 C), resp. rate 15, height '5\' 2"'$  (1.575 m), weight 98.2 kg, SpO2 96 %.   General: No acute distress Mood and affect are appropriate Heart: Regular rate and rhythm no rubs murmurs or extra sounds Lungs: Clear to auscultation, breathing unlabored, no rales or wheezes Abdomen: Positive bowel sounds, soft nontender to palpation, nondistended Extremities: No clubbing, cyanosis, or edema Skin: No evidence of breakdown, no evidence of rash Neurologic: Cranial nerves II through XII intact, motor strength is 4/5 in right and 3- left deltoid, bicep, tricep, grip,5/5 RLE, 4/5 LLE  Sensory exam normal sensation to light touch and proprioception in RIght upper absent proprio and reduced LT sensation LUE Cerebellar exam normal finger to nose to finger as well as heel to shin in bilateral upper and lower extremities Musculoskeletal: Full range of motion in all 4 extremities. No joint swelling, pain with passive dorsiflexion RIght ankle  Tenderness Left 1st MTP joint, mild pain with ROM    Assessment/Plan: 1. Functional deficits which require 3+ hours per day of interdisciplinary therapy in a comprehensive inpatient rehab setting. Physiatrist is providing close team supervision  and 24 hour management of active medical problems listed below. Physiatrist and rehab team continue to assess barriers to discharge/monitor patient progress toward functional and medical goals  Care Tool:  Bathing    Body parts bathed by patient: Right arm, Left arm, Chest, Abdomen, Front perineal area, Buttocks, Right upper leg, Left upper leg, Right lower leg, Face     Body parts n/a: Left lower leg, Right upper leg, Left upper leg, Right lower leg, Buttocks, Front perineal area   Bathing assist Assist Level: Minimal Assistance - Patient > 75%     Upper Body Dressing/Undressing Upper body dressing   What is the patient wearing?: Bra, Pull over shirt    Upper body assist Assist Level: Minimal Assistance - Patient > 75%    Lower Body Dressing/Undressing Lower body dressing      What is the patient wearing?: Underwear/pull up, Pants     Lower body assist Assist for lower body dressing: Minimal Assistance - Patient > 75%     Toileting Toileting Toileting Activity did not occur (Clothing management and hygiene only): N/A (no void or bm)  Toileting assist Assist for toileting: Contact Guard/Touching assist     Transfers Chair/bed transfer  Transfers assist  Chair/bed transfer assist level: Contact Guard/Touching assist     Locomotion Ambulation   Ambulation assist   Ambulation activity did not occur: Safety/medical concerns  Assist level: Minimal Assistance - Patient > 75% Assistive device: Walker-rolling Max distance: 74f   Walk 10 feet activity   Assist     Assist level: Minimal Assistance - Patient > 75% Assistive device: Walker-rolling   Walk 50 feet activity   Assist Walk 50 feet with 2 turns activity did not occur: Safety/medical concerns  Assist level: Minimal Assistance - Patient > 75% Assistive device: Walker-rolling    Walk 150 feet activity   Assist Walk 150 feet activity did not occur: Safety/medical concerns          Walk 10 feet on uneven surface  activity   Assist Walk 10 feet on uneven surfaces activity did not occur: Safety/medical concerns         Wheelchair     Assist Is the patient using a wheelchair?: Yes (for transportation) Type of Wheelchair: Manual    Wheelchair assist level: Dependent - Patient 0%      Wheelchair 50 feet with 2 turns activity    Assist        Assist Level: Dependent - Patient 0%   Wheelchair 150 feet activity     Assist      Assist Level: Dependent - Patient 0%   Blood pressure (!) 153/86, pulse 79, temperature 97.8 F (36.6 C), resp. rate 15, height '5\' 2"'$  (1.575 m), weight 98.2 kg, SpO2 96 %.  Medical Problem List and Plan: 1. 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              -patient may shower             -ELOS/Goals: 10-12 days             -Cont CIR PT, OT 2.  Antithrombotics: -DVT/anticoagulation:  Pharmaceutical: Lovenox             -antiplatelet therapy: DAPT X 3 weeks followed by plavix alone on 01/27/22 3. Pain Management: tylenol prn.  RIght achilles pain will add voltaren gel and night splint Gout Left 1st MTP- start medrol dosepack  4. Mood/Sleep: LCSW to follow for evaluation and support.              -antipsychotic agents: N/A 5. Neuropsych/cognition: This patient is capable of making decisions on her own behalf. 6. Skin/Wound Care: Routine pressure relief measures.  7. Fluids/Electrolytes/Nutrition: Monitor I/O. Check CMET in am.  8. T2DM: Hgb A1c- 7.1. Was on Januvia 100/Metform 1000 mg BID PTA.  --Monitor BS ac/hs and use SSI for elevated BS.              CBG (last 3)  Recent Labs    01/22/22 1631 01/22/22 2056 01/23/22 0538  GLUCAP 236* 249* 167*   Controlled metformin and tradjenta -6/28 Well controlled, 9. HTN: Monitor BP TID--continue Lisinopril and Lasix             -BP goal <180/105            Vitals:    01/22/22 2000 01/23/22 0543  BP: 102/90 (!) 153/86  Pulse: 98 79  Resp: 15 15  Temp: 98 F (36.7 C) 97.8 F (36.6 C)  SpO2: 938%975% 66/43systolic elevation   10. Non-alcoholic fatty liver disease: Stable. Avoid hepatotoxic medications.             --  LFTs WNL. 11. H/o Melanoma/Persistent Mediastinal adenopathy: Per records review--was evaluated by Dr. Delton Coombes with repeat CT chest recommended but no follow up noted in records.  12. Lumbar stenosis with RLE radiculopathy: ESI by Dr. Ernestina Patches effective in the past.             --now with discomfort due to lack of activity.  --monitor for now. Tramadol prn added.   13. Sleep smart study/OSA: Has been compliant with CPAP use.  14. Low grade fever/Leucocytosis:   Resolving. Monitor for signs of infection. 15. AKI: SCr up likely due to addition of ACE/Better BP control -Encourage oral fluids 16. Left parotid nodule: Follow up with ENT after discharge.            -- 17. 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  --Will start low dose gabapentin. 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  Left eye mildly injected with hx of Left sided numness may be irritated from inadvertant contact will add wetting agent  19. Anemia HGB 11  -possibly dilutional?,  follow  LOS: 9 days A FACE TO FACE EVALUATION WAS PERFORMED  Shelby Morgan 01/23/2022, 9:07 AM

## 2022-01-23 NOTE — Progress Notes (Signed)
Slept well last night into this morning. Tolerated CPAP at night. CBG monitored and correctional insulin administered last night. Remains continent X2. Ice packs to Lt foot. Safety maintained at all times.

## 2022-01-23 NOTE — Progress Notes (Signed)
Occupational Therapy Session Note  Patient Details  Name: Shelby Morgan MRN: [id number] Date of Birth: July 04, 1956  Today's Date: 01/23/2022 OT Individual Time: 1202-1211 OT Individual Time Calculation (min): 9 min  and Today's Date: 01/23/2022 OT Group Time: 1102-1202 OT Group Time Calculation (min): 60 min   Short Term Goals: Week 2:  OT Short Term Goal 1 (Week 2): STG= LTG  Skilled Therapeutic Interventions/Progress Updates:  Group session: Pt participated in group session with a focus on stress mgmt, education on healthy coping strategies, and social interaction. Focus of session on providing coping strategies to manage new diagnosis to allow for improved mental health to increase overall quality of life . Discussed how to break down stressors into "daily hassles," "major life stressors" and "life circumstances" in an effort to allow pts to chunk their stressors into groups and determine where to best put their efforts/time when dealing with stress. Pt actively sharing stressors and contributing to group conversation. Provided active listening, emotional support and therapeutic use of self. Offered education on factors that protect [place] against stress such as "daily uplifts," "healthy coping strategies" and "protective factors." Encouraged all group members to make an effort to actively recall one event from their day that was a daily uplift in an effort to protect their mindset from stressors as well as sharing this information with their caregivers to facilitate improved caregiver communication and decrease overall burden of care.  Issued pt handouts on healthy coping strategies to implement into routine. Pt transported back to room by this OTA.  Individual session: once in room pt reports need to void bladder, pt able to ambulate to bathroom for toileting with Rw and CGA. Pt completed 3/3 toileting tasks with CGA, discussed whether or not mom has grab bars at home, pt reports she doesn't think  so but will ask. Encouraged pt to try to use RW for stability during toileting since current plan is to DC to her mothers home, will continue to assess need for grab bars for toileting. Pt exited bathroom with Rw and CGA, pt left seated in w/c with alarm belt activated and all needs within reach.   Therapy Documentation Precautions:  Precautions Precautions: Fall, Other (comment) Precaution Comments: L hemiparesis with severely impaired sensation Restrictions Weight Bearing Restrictions: No LLE Weight Bearing: Weight bearing as tolerated   Pain: no pain reported   Therapy/Group: Individual Therapy and Group Therapy  [name] 01/23/2022, 12:34 PM

## 2022-01-23 NOTE — Progress Notes (Signed)
Occupational Therapy Session Note  Patient Details  Name: Shelby Morgan MRN: [id number] Date of Birth: 1956/01/30  Today's Date: 01/23/2022 OT Individual Time: 8250-5397 OT Individual Time Calculation (min): 77 min    Short Term Goals: Week 2:  OT Short Term Goal 1 (Week 2): STG= LTG  Skilled Therapeutic Interventions/Progress Updates:  Pt greeted seated in w/c agreeable to OT intervention. Session focus on BADL reeducation, functional mobility, dynamic standing balance and decreasing overall caregiver burden.     Pt with improved pain today vs yesterday able to Wb on LLE and ambulate to bathroom for toileting with Rw and CGA. Pt completed 3/3 toileting tasks with CGA. Pt ambulated to walkin shower with CGA with Rw and use of grab bars.pt completed bathing with overall CGA, CGA needed when pt sit>stand to wash buttock. Pt used bathmit on L hand to accommodate for decreased grip strength as well as long handled sponge to reach feet from sitting on shower seat. Pt exited shower with RW and CGA.  Pt completed dressing from sitting on toilet, set- up for UB dressing and CGA for LB dressing with + time and effort to pull up pants on L side. Pt exited bathroom with rw and CGA where pt sat at sink to complete oral care with supervision. Assisted pt with blow drying hair with pt using BUEs to use brush d/t impaired sensation in L hand and OTA using blow dryer.  pt left seated in w/c with alarm belt activated and all needs within reach.                  Therapy Documentation Precautions:  Precautions Precautions: Fall, Other (comment) Precaution Comments: L hemiparesis with severely impaired sensation Restrictions Weight Bearing Restrictions: No LLE Weight Bearing: Weight bearing as tolerated  Pain: unrated pain reported in L foot, rest breaks, repositioning and elevation used as needed.     Therapy/Group: Individual Therapy  [name] 01/23/2022, 12:21 PM

## 2022-01-24 LAB — GLUCOSE, CAPILLARY
Glucose-Capillary: 252 mg/dL — ABNORMAL HIGH (ref 70–99)
Glucose-Capillary: 148 mg/dL — ABNORMAL HIGH (ref 70–99)
Glucose-Capillary: 228 mg/dL — ABNORMAL HIGH (ref 70–99)
Glucose-Capillary: 198 mg/dL — ABNORMAL HIGH (ref 70–99)

## 2022-01-24 NOTE — Progress Notes (Signed)
Placed patient on home CPAP for the night  

## 2022-01-24 NOTE — Progress Notes (Signed)
Patient daughter in law, Noreene Larsson, who also stated that she is a nurse case manager, Informed nursing that patient will not be able to go home with mother as originally planned. Informed Ms. Hall Busing to call and leave message with social worker and that this will be discussed at care conference. Also informed that social worker will not be in until Monday. Ms. Hall Busing stated that she is talking and working something out with the son, who lives in [place] to see if they can get her down there and wanted to know if patient will be able to stay until things get switched to [place]. Informed to leave message with Education officer, museum. Sanda Linger, LPN

## 2022-01-24 NOTE — Progress Notes (Signed)
PROGRESS NOTE   Subjective/Complaints: No issues overnight, still bothered by left great toe due to gout but improving on Medrol dose pack.  ROS-neg for CP, SOB, N/V/D, chills, fever  Objective:   No results found. No results for input(s): "WBC", "HGB", "HCT", "PLT" in the last 72 hours.  Recent Labs    01/21/22 2145 01/23/22 0535  NA 134* 134*  K 4.5 5.1  CL 96* 101  CO2 25 24  GLUCOSE 133* 166*  BUN 35* 35*  CREATININE 1.69* 1.39*  CALCIUM 10.4* 10.3    Intake/Output Summary (Last 24 hours) at 01/24/2022 1503 Last data filed at 01/24/2022 1314 Gross per 24 hour  Intake 1237 ml  Output --  Net 1237 ml        Physical Exam: Vital Signs Blood pressure (!) 150/83, pulse 79, temperature 97.6 F (36.4 C), temperature source Oral, resp. rate 18, height '5\' 2"'$  (1.575 m), weight 98.2 kg, SpO2 96 %.   General: Alert and oriented x 3, No apparent distress HEENT: Head is normocephalic, atraumatic, PERRLA, EOMI, sclera anicteric, oral mucosa pink and moist, dentition intact, ext ear canals clear,  Neck: Supple without JVD or lymphadenopathy Heart: Reg rate and rhythm. No murmurs rubs or gallops Chest: CTA bilaterally without wheezes, rales, or rhonchi; no distress Abdomen: Soft, non-tender, non-distended, bowel sounds positive. Extremities: No clubbing, cyanosis, or edema. Pulses are 2+ Psych: Pt's affect is appropriate. Pt is cooperative Skin: Clean and intact without signs of breakdown Neuro: CN II-XII intact, motor strength 4/5 in right and 3-/5 left deltoid, bicep, tricep, grip. 5/5 RLE, 4/5 LLE Sensory: Normal to LT and propioception in Rt upper, absent propio and reduced LT sensation LUE. Cerebellar exam: Normal finger to nose and heel to shin bilateral upper and lower extremities. Musculoskeletal: FROM all 4 extremities.  No joint swelling, pain with passive dorsiflexion Right ankle. Tenderness Left 1st MTP joint,  mild pain with ROM   Assessment/Plan: 1. Functional deficits which require 3+ hours per day of interdisciplinary therapy in a comprehensive inpatient rehab setting. Physiatrist is providing close team supervision and 24 hour management of active medical problems listed below. Physiatrist and rehab team continue to assess barriers to discharge/monitor patient progress toward functional and medical goals  Care Tool:  Bathing    Body parts bathed by patient: Right arm, Left arm, Chest, Abdomen, Front perineal area, Buttocks, Right upper leg, Left upper leg, Right lower leg, Face, Left lower leg     Body parts n/a: Left lower leg, Right upper leg, Left upper leg, Right lower leg, Buttocks, Front perineal area   Bathing assist Assist Level: Contact Guard/Touching assist     Upper Body Dressing/Undressing Upper body dressing   What is the patient wearing?: Bra, Pull over shirt    Upper body assist Assist Level: Set up assist    Lower Body Dressing/Undressing Lower body dressing      What is the patient wearing?: Underwear/pull up, Pants     Lower body assist Assist for lower body dressing: Contact Guard/Touching assist     Toileting Toileting Toileting Activity did not occur (Clothing management and hygiene only): N/A (no void or bm)  Toileting assist  Assist for toileting: Contact Guard/Touching assist     Transfers Chair/bed transfer  Transfers assist     Chair/bed transfer assist level: Contact Guard/Touching assist     Locomotion Ambulation   Ambulation assist   Ambulation activity did not occur: Safety/medical concerns  Assist level: Minimal Assistance - Patient > 75% Assistive device: Walker-rolling Max distance: 50f   Walk 10 feet activity   Assist     Assist level: Minimal Assistance - Patient > 75% Assistive device: Walker-rolling   Walk 50 feet activity   Assist Walk 50 feet with 2 turns activity did not occur: Safety/medical  concerns  Assist level: Minimal Assistance - Patient > 75% Assistive device: Walker-rolling    Walk 150 feet activity   Assist Walk 150 feet activity did not occur: Safety/medical concerns         Walk 10 feet on uneven surface  activity   Assist Walk 10 feet on uneven surfaces activity did not occur: Safety/medical concerns         Wheelchair     Assist Is the patient using a wheelchair?: Yes (for transportation) Type of Wheelchair: Manual    Wheelchair assist level: Dependent - Patient 0%      Wheelchair 50 feet with 2 turns activity    Assist        Assist Level: Dependent - Patient 0%   Wheelchair 150 feet activity     Assist      Assist Level: Dependent - Patient 0%   Blood pressure (!) 150/83, pulse 79, temperature 97.6 F (36.4 C), temperature source Oral, resp. rate 18, height '5\' 2"'$  (1.575 m), weight 98.2 kg, SpO2 96 %.  Medical Problem List and Plan: 1. 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              -patient may shower             -ELOS/Goals: 10-12 days             -Cont CIR PT, OT 2.  Antithrombotics: -DVT/anticoagulation:  Pharmaceutical: Lovenox             -antiplatelet therapy: DAPT X 3 weeks followed by plavix alone on 01/27/22 3. Pain Management: tylenol prn.  RIght achilles pain will add voltaren gel and night splint Gout Left 1st MTP- start medrol dosepack  7/1 Consistent from yesterday, continue Medrol dose pack 4. Mood/Sleep: LCSW to follow for evaluation and support.              -antipsychotic agents: N/A 5. Neuropsych/cognition: This patient is capable of making decisions on her own behalf. 6. Skin/Wound Care: Routine pressure relief measures.  7. Fluids/Electrolytes/Nutrition: Monitor I/O. Check CMET in am.  8. T2DM: Hgb A1c- 7.1. Was on Januvia 100/Metform 1000 mg BID PTA.  --Monitor BS ac/hs and use SSI for elevated BS.               CBG (last 3)  Recent Labs    01/23/22 2104 01/24/22 0549 01/24/22 1141  GLUCAP 218* 252* 148*  Controlled metformin and tradjenta -6/28 Well controlled, 7/1 Fairly well controlled, continue current regimen. 9. HTN: Monitor BP TID--continue Lisinopril and Lasix             -BP goal <180/105            Vitals:   01/24/22 0452 01/24/22 1314  BP: (!) 141/80 (!) 150/83  Pulse: 74 79  Resp:  15 18  Temp: 98 F (36.7 C) 97.6 F (36.4 C)  SpO2: 35% 70%  1/77 systolic elevation 7/1 SBP 140's-150's  10. Non-alcoholic fatty liver disease: Stable. Avoid hepatotoxic medications.             --LFTs WNL. 11. H/o Melanoma/Persistent Mediastinal adenopathy: Per records review--was evaluated by Dr. Delton Coombes with repeat CT chest recommended but no follow up noted in records.  12. Lumbar stenosis with RLE radiculopathy: ESI by Dr. Ernestina Patches effective in the past.             --now with discomfort due to lack of activity.  --monitor for now. Tramadol prn added.   13. Sleep smart study/OSA: Has been compliant with CPAP use.  14. Low grade fever/Leucocytosis:   Resolving. Monitor for signs of infection. 15. AKI: SCr up likely due to addition of ACE/Better BP control -Encourage oral fluids 16. Left parotid nodule: Follow up with ENT after discharge.            -- 17. 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  --Will start low dose gabapentin. 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 18.  Left eye mildly injected with hx of Left sided numness may be irritated from inadvertant contact will add wetting agent  19. Anemia  HGB 11  -possibly dilutional?,  follow  LOS: 10 days A FACE TO FACE EVALUATION WAS PERFORMED  Luetta Nutting 01/24/2022, 3:03 PM

## 2022-01-25 LAB — GLUCOSE, CAPILLARY
Glucose-Capillary: 173 mg/dL — ABNORMAL HIGH (ref 70–99)
Glucose-Capillary: 190 mg/dL — ABNORMAL HIGH (ref 70–99)
Glucose-Capillary: 196 mg/dL — ABNORMAL HIGH (ref 70–99)
Glucose-Capillary: 178 mg/dL — ABNORMAL HIGH (ref 70–99)

## 2022-01-25 MED ORDER — COLCHICINE 0.6 MG PO TABS
0.6000 mg | ORAL_TABLET | Freq: Every day | ORAL | Status: AC
  Administered 2022-01-25 – 2022-01-27 (×3): 0.6 mg via ORAL
  Filled 2022-01-25 (×3): qty 1

## 2022-01-25 NOTE — Progress Notes (Signed)
PROGRESS NOTE   Subjective/Complaints: Primary issue this am is that her gout in Left great toe is bothering her today worse than yesterday.  She noticed this upon standing and bearing weight on foot.  Pt was started started on Medrol dose pack end of last week, but pain persists with movement of Lt great toe, redness has subsided, no swelling.  ROS-neg for CP, SOB, N/V/D, chills, fever  Objective:   No results found. No results for input(s): "WBC", "HGB", "HCT", "PLT" in the last 72 hours.  Recent Labs    01/23/22 0535  NA 134*  K 5.1  CL 101  CO2 24  GLUCOSE 166*  BUN 35*  CREATININE 1.39*  CALCIUM 10.3    Intake/Output Summary (Last 24 hours) at 01/25/2022 1228 Last data filed at 01/25/2022 0730 Gross per 24 hour  Intake 720 ml  Output --  Net 720 ml        Physical Exam: Vital Signs Blood pressure 136/84, pulse 68, temperature 98 F (36.7 C), resp. rate 16, height '5\' 2"'$  (1.575 m), weight 98.2 kg, SpO2 96 %.   General: Alert and oriented x 3, No apparent distress HEENT: Head is normocephalic, atraumatic, PERRLA, EOMI, sclera anicteric, oral mucosa pink and moist, dentition intact, ext ear canals clear,  Neck: Supple without JVD or lymphadenopathy Heart: Reg rate and rhythm. No murmurs rubs or gallops Chest: CTA bilaterally without wheezes, rales, or rhonchi; no distress Abdomen: Soft, non-tender, non-distended, bowel sounds positive. Extremities: No clubbing, cyanosis, or edema. Pulses are 2+ Psych: Pt's affect is appropriate. Pt is cooperative Skin: Clean and intact without signs of breakdown Neuro: CN II-XII intact, motor strength 4/5 in right and 3-/5 left deltoid, bicep, tricep, grip. 5/5 RLE, 4/5 LLE Sensory: Normal to LT and propioception in Rt upper, absent propio and reduced LT sensation LUE. Cerebellar exam: Normal finger to nose and heel to shin bilateral upper and lower  extremities. Musculoskeletal: FROM all 4 extremities.  No joint swelling, pain with passive dorsiflexion Right ankle. Tenderness Left 1st MTP joint, mild pain with ROM, pain worse today than yesterday, mostly with weight bearing on feet and with extension/flexion of Lt great toe. No redness or swelling of Lt great toe.   Assessment/Plan: 1. Functional deficits which require 3+ hours per day of interdisciplinary therapy in a comprehensive inpatient rehab setting. Physiatrist is providing close team supervision and 24 hour management of active medical problems listed below. Physiatrist and rehab team continue to assess barriers to discharge/monitor patient progress toward functional and medical goals  Care Tool:  Bathing    Body parts bathed by patient: Right arm, Left arm, Chest, Abdomen, Front perineal area, Buttocks, Right upper leg, Left upper leg, Right lower leg, Face, Left lower leg     Body parts n/a: Left lower leg, Right upper leg, Left upper leg, Right lower leg, Buttocks, Front perineal area   Bathing assist Assist Level: Contact Guard/Touching assist     Upper Body Dressing/Undressing Upper body dressing   What is the patient wearing?: Bra, Pull over shirt    Upper body assist Assist Level: Set up assist    Lower Body Dressing/Undressing Lower body dressing  What is the patient wearing?: Underwear/pull up, Pants     Lower body assist Assist for lower body dressing: Contact Guard/Touching assist     Toileting Toileting Toileting Activity did not occur (Clothing management and hygiene only): N/A (no void or bm)  Toileting assist Assist for toileting: Contact Guard/Touching assist     Transfers Chair/bed transfer  Transfers assist     Chair/bed transfer assist level: Contact Guard/Touching assist     Locomotion Ambulation   Ambulation assist   Ambulation activity did not occur: Safety/medical concerns  Assist level: Minimal Assistance - Patient  > 75% Assistive device: Walker-rolling Max distance: 59f   Walk 10 feet activity   Assist     Assist level: Minimal Assistance - Patient > 75% Assistive device: Walker-rolling   Walk 50 feet activity   Assist Walk 50 feet with 2 turns activity did not occur: Safety/medical concerns  Assist level: Minimal Assistance - Patient > 75% Assistive device: Walker-rolling    Walk 150 feet activity   Assist Walk 150 feet activity did not occur: Safety/medical concerns         Walk 10 feet on uneven surface  activity   Assist Walk 10 feet on uneven surfaces activity did not occur: Safety/medical concerns         Wheelchair     Assist Is the patient using a wheelchair?: Yes (for transportation) Type of Wheelchair: Manual    Wheelchair assist level: Dependent - Patient 0%      Wheelchair 50 feet with 2 turns activity    Assist        Assist Level: Dependent - Patient 0%   Wheelchair 150 feet activity     Assist      Assist Level: Dependent - Patient 0%   Blood pressure 136/84, pulse 68, temperature 98 F (36.7 C), resp. rate 16, height '5\' 2"'$  (1.575 m), weight 98.2 kg, SpO2 96 %.  Medical Problem List and Plan: 1. 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              -patient may shower             -ELOS/Goals: 10-12 days             -Cont CIR PT, OT 2.  Antithrombotics: -DVT/anticoagulation:  Pharmaceutical: Lovenox             -antiplatelet therapy: DAPT X 3 weeks followed by plavix alone on 01/27/22 3. Pain Management: tylenol prn.  RIght achilles pain will add voltaren gel and night splint Gout Left 1st MTP- start medrol dosepack  7/1 Consistent from yesterday, continue Medrol dose pack 7/2 Symptoms worse today, pain with weight bearing and extension/flexion Lt great toe.  Discussed with Dr. LDagoberto Ligas MD.  Ordered colchicine 0.6 mg daily for 3 days while  continuing Medrol dose pack. 4. Mood/Sleep: LCSW to follow for evaluation and support.              -antipsychotic agents: N/A 5. Neuropsych/cognition: This patient is capable of making decisions on her own behalf. 6. Skin/Wound Care: Routine pressure relief measures.  7. Fluids/Electrolytes/Nutrition: Monitor I/O. Check CMET in am.  8. T2DM: Hgb A1c- 7.1. Was on Januvia 100/Metform 1000 mg BID PTA.  --Monitor BS ac/hs and use SSI for elevated BS.              CBG (last 3)  Recent Labs    01/24/22 2131 01/25/22  4098 01/25/22 1149  GLUCAP 198* 173* 190*  Controlled metformin and tradjenta -6/28 Well controlled, 7/2 Fairly well controlled, continue current regimen. 9. HTN: Monitor BP TID--continue Lisinopril and Lasix             -BP goal <180/105            Vitals:   01/24/22 1940 01/25/22 0526  BP: (!) 149/73 136/84  Pulse: 80 68  Resp: 16 16  Temp: 98 F (36.7 C) 98 F (36.7 C)  SpO2: 11% 91%  4/78 systolic elevation 7/2 SBP upper 130's-150's  10. Non-alcoholic fatty liver disease: Stable. Avoid hepatotoxic medications.             --LFTs WNL. 11. H/o Melanoma/Persistent Mediastinal adenopathy: Per records review--was evaluated by Dr. Delton Coombes with repeat CT chest recommended but no follow up noted in records.  12. Lumbar stenosis with RLE radiculopathy: ESI by Dr. Ernestina Patches effective in the past.             --now with discomfort due to lack of activity.  --monitor for now. Tramadol prn added.   13. Sleep smart study/OSA: Has been compliant with CPAP use.  14. Low grade fever/Leucocytosis:   Resolving. Monitor for signs of infection. 15. AKI: SCr up likely due to addition of ACE/Better BP control -Encourage oral fluids 16. Left parotid nodule: Follow up with ENT after discharge.            -- 17. 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  --Will start low dose gabapentin. 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 18.  Left eye mildly injected with hx of Left sided numness may be irritated from inadvertant contact will add wetting agent  19. Anemia HGB 11  -possibly dilutional?,  follow  LOS: 11 days A FACE TO FACE EVALUATION WAS PERFORMED  Luetta Nutting 01/25/2022, 12:28 PM

## 2022-01-25 NOTE — Progress Notes (Signed)
*  Late note*  Pt states she will call when ready for CPAP. RT will cont to monitor.

## 2022-01-25 NOTE — Progress Notes (Signed)
Occupational Therapy Session Note  Patient Details  Name: Shelby Morgan MRN: [id number] Date of Birth: 06-Mar-1956  Today's Date: 01/25/2022 OT Individual Time: 0750-0905 OT Individual Time Calculation (min): 75 min    Short Term Goals: Week 1:  OT Short Term Goal 1 (Week 1): Patient will bathe her upper body with min assistance using BUE OT Short Term Goal 1 - Progress (Week 1): Met OT Short Term Goal 2 (Week 1): Patient will bathe lower body with min assist using BUE OT Short Term Goal 2 - Progress (Week 1): Met OT Short Term Goal 3 (Week 1): Patient will transfer to toilet with min assist OT Short Term Goal 3 - Progress (Week 1): Met OT Short Term Goal 4 (Week 1): Patient will demonstrate sufficient dynamic stand tolerance and balance for lower body dressing, perianal care with min assist OT Short Term Goal 4 - Progress (Week 1): Met OT Short Term Goal 5 (Week 1): Patient will use BUE to cut up food or complete other bimanual tasks with cueing and supervision OT Short Term Goal 5 - Progress (Week 1): Met Week 2:  OT Short Term Goal 1 (Week 2): STG= LTG  Skilled Therapeutic Interventions/Progress Updates:    Patient resting supine in bed upon arrival with nursing doing morning rounds, the pt indicated that she was experiencing pain associate with gout flare- up , pt went on to say she was given medication to address her pain response.  The pt had a reported pain response of 6 on 0-10.  The pt indicated that she would like to wash up at sink LOF, the pt was able to transfer from supine to EOB with CGA, she was able to complete a stand pivot transfer from EOB to w/c LOF for toileting with MinA .  The pt completed a toileting transfer from the w/c with intial cues for incorporating the grab bar for increase safety and independence with CGA .  The pt  was able to doff her LB garments underwear and pants, as well as, wash her LB with close S  incorporating the grab bar for greater reach.  The pt  was instructed to donn her clothing items, which included, underwear and pants,  using hemi technique with ModA .  The pt attempted the task incorporating BUE, using the LUE as a prime mover and the RUE as an assist to improve functional performance for bilateral hand activity.  The pt returned to her living quarters  and positioned herself at sink LOF and removed her over head top with SBA, she was able to wash her UB with s/u assist and close S.  The pt was instructed on approach her RUE first to donn her over head top, she was able to complete the task with intial cues and MinA. The pt completed a simolke task in brushing her teeth and combing her hair with w/u assist.  The pt returned to bed LOF with her bed side table and call light within reach.  The pt's alarm was activated and all additional needs were addressed prior to exiting the room.  The pt hand no report of pain this treatment session.   Therapy Documentation Precautions:  Precautions Precautions: Fall, Other (comment) Precaution Comments: L hemiparesis with severely impaired sensation Restrictions Weight Bearing Restrictions: No LLE Weight Bearing: Weight bearing as tolerated Therapy/Group: Individual Therapy  Yvonne Kendall 01/25/2022, 4:15 PM

## 2022-01-26 LAB — CBC
HCT: 38.2 % (ref 36.0–46.0)
Hemoglobin: 12.6 g/dL (ref 12.0–15.0)
MCH: 28 pg (ref 26.0–34.0)
MCHC: 33 g/dL (ref 30.0–36.0)
MCV: 84.9 fL (ref 80.0–100.0)
Platelets: 556 10*3/uL — ABNORMAL HIGH (ref 150–400)
RBC: 4.5 MIL/uL (ref 3.87–5.11)
RDW: 13.2 % (ref 11.5–15.5)
WBC: 13 10*3/uL — ABNORMAL HIGH (ref 4.0–10.5)
nRBC: 0 % (ref 0.0–0.2)

## 2022-01-26 LAB — GLUCOSE, CAPILLARY
Glucose-Capillary: 177 mg/dL — ABNORMAL HIGH (ref 70–99)
Glucose-Capillary: 138 mg/dL — ABNORMAL HIGH (ref 70–99)
Glucose-Capillary: 161 mg/dL — ABNORMAL HIGH (ref 70–99)
Glucose-Capillary: 133 mg/dL — ABNORMAL HIGH (ref 70–99)

## 2022-01-26 NOTE — Progress Notes (Signed)
Patient ID: Shelby Morgan, female   DOB: Oct 10, 1955, 66 y.o.   MRN: [id number]  DME ordered through [employer]: [place] and [employer]. Patient prefers OP at [facility].

## 2022-01-26 NOTE — Progress Notes (Signed)
Orthopedic Tech Progress Note Patient Details:  SIRENA RIDDLE [birth year] [id number]  HANGER does not have sandals   Patient ID: BONNIEJEAN PIANO, female   DOB: August 16, 1955, 66 y.o.   MRN: [id number]  Janit Pagan 01/26/2022, 10:07 AM

## 2022-01-26 NOTE — Progress Notes (Signed)
CPAP is set up on bedside table. Patient is able to place herself on and off without assistance. RT instructed pt to have RT called if needed. RT will monitor as needed.

## 2022-01-26 NOTE — Progress Notes (Signed)
PROGRESS NOTE   Subjective/Complaints: Left foot /big toe sore when shoes are on and with walking  ROS-neg for CP, SOB, N/V/D, chills, fever  Objective:   No results found. Recent Labs    01/26/22 0746  WBC 13.0*  HGB 12.6  HCT 38.2  PLT 556*    No results for input(s): "NA", "K", "CL", "CO2", "GLUCOSE", "BUN", "CREATININE", "CALCIUM" in the last 72 hours.   Intake/Output Summary (Last 24 hours) at 01/26/2022 0934 Last data filed at 01/26/2022 0751 Gross per 24 hour  Intake 1180 ml  Output --  Net 1180 ml         Physical Exam: Vital Signs Blood pressure 124/78, pulse 70, temperature 97.7 F (36.5 C), resp. rate 15, height '5\' 2"'$  (1.575 m), weight 98.2 kg, SpO2 95 %.    General: No acute distress Mood and affect are appropriate Heart: Regular rate and rhythm no rubs murmurs or extra sounds Lungs: Clear to auscultation, breathing unlabored, no rales or wheezes Abdomen: Positive bowel sounds, soft nontender to palpation, nondistended Extremities: No clubbing, cyanosis, or edema Skin: No evidence of breakdown, no evidence of rash   Neuro: CN II-XII intact, motor strength 4/5 in right and 3-/5 left deltoid, bicep, tricep, grip. 5/5 RLE, 4/5 LLE Sensory: Normal to LT and propioception in Rt upper, absent propio and reduced LT sensation LUE and reduced LT LLE  . Musculoskeletal: FROM all 4 extremities.  No joint swelling, pain with passive dorsiflexion Right ankle. Tenderness Left 1st MTP joint, mild pain with ROM, No redness or swelling of Lt great toe.   Assessment/Plan: 1. Functional deficits which require 3+ hours per day of interdisciplinary therapy in a comprehensive inpatient rehab setting. Physiatrist is providing close team supervision and 24 hour management of active medical problems listed below. Physiatrist and rehab team continue to assess barriers to discharge/monitor patient progress toward  functional and medical goals  Care Tool:  Bathing    Body parts bathed by patient: Right arm, Left arm, Chest, Abdomen, Front perineal area, Buttocks, Right upper leg, Left upper leg, Right lower leg, Face, Left lower leg     Body parts n/a: Left lower leg, Right upper leg, Left upper leg, Right lower leg, Buttocks, Front perineal area   Bathing assist Assist Level: Contact Guard/Touching assist     Upper Body Dressing/Undressing Upper body dressing   What is the patient wearing?: Bra, Pull over shirt    Upper body assist Assist Level: Set up assist    Lower Body Dressing/Undressing Lower body dressing      What is the patient wearing?: Underwear/pull up, Pants     Lower body assist Assist for lower body dressing: Contact Guard/Touching assist     Toileting Toileting Toileting Activity did not occur (Clothing management and hygiene only): N/A (no void or bm)  Toileting assist Assist for toileting: Contact Guard/Touching assist     Transfers Chair/bed transfer  Transfers assist     Chair/bed transfer assist level: Contact Guard/Touching assist     Locomotion Ambulation   Ambulation assist   Ambulation activity did not occur: Safety/medical concerns  Assist level: Minimal Assistance - Patient > 75% Assistive device: Walker-rolling  Max distance: 26f   Walk 10 feet activity   Assist     Assist level: Minimal Assistance - Patient > 75% Assistive device: Walker-rolling   Walk 50 feet activity   Assist Walk 50 feet with 2 turns activity did not occur: Safety/medical concerns  Assist level: Minimal Assistance - Patient > 75% Assistive device: Walker-rolling    Walk 150 feet activity   Assist Walk 150 feet activity did not occur: Safety/medical concerns         Walk 10 feet on uneven surface  activity   Assist Walk 10 feet on uneven surfaces activity did not occur: Safety/medical concerns         Wheelchair     Assist Is the  patient using a wheelchair?: Yes (for transportation) Type of Wheelchair: Manual    Wheelchair assist level: Dependent - Patient 0%      Wheelchair 50 feet with 2 turns activity    Assist        Assist Level: Dependent - Patient 0%   Wheelchair 150 feet activity     Assist      Assist Level: Dependent - Patient 0%   Blood pressure 124/78, pulse 70, temperature 97.7 F (36.5 C), resp. rate 15, height '5\' 2"'$  (1.575 m), weight 98.2 kg, SpO2 95 %.  Medical Problem List and Plan: 1. 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              -patient may shower             -ELOS/Goals: 10-12 days             -Cont CIR PT, OT 2.  Antithrombotics: -DVT/anticoagulation:  Pharmaceutical: Lovenox             -antiplatelet therapy: DAPT X 3 weeks followed by plavix alone on 01/27/22 3. Pain Management: tylenol prn.  RIght achilles pain will add voltaren gel and night splint Gout Left 1st MTP- start medrol dosepack  7/1 Consistent from yesterday, continue Medrol dose pack 7/2 Symptoms worse today, pain with weight bearing and extension/flexion Lt great toe.  Discussed with Dr. LDagoberto Ligas MD.  Ordered colchicine 0.6 mg daily for 3 days while continuing Medrol dose pack. Left foot xray unremarkable  4. Mood/Sleep: LCSW to follow for evaluation and support.              -antipsychotic agents: N/A 5. Neuropsych/cognition: This patient is capable of making decisions on her own behalf. 6. Skin/Wound Care: Routine pressure relief measures.  7. Fluids/Electrolytes/Nutrition: Monitor I/O. Check CMET in am.  8. T2DM: Hgb A1c- 7.1. Was on Januvia 100/Metform 1000 mg BID PTA.  --Monitor BS ac/hs and use SSI for elevated BS.              CBG (last 3)  Recent Labs    01/25/22 1635 01/25/22 2045 01/26/22 0536  GLUCAP 196* 178* 177*   Controlled metformin and tradjenta Fair control despite medrol  9. HTN:  Monitor BP TID--continue Lisinopril and Lasix             -BP goal <180/105            Vitals:   01/25/22 1931 01/26/22 0533  BP: 139/83 124/78  Pulse: 85 70  Resp: 16 15  Temp: 97.6 F (36.4 C) 97.7 F (36.5 C)  SpO2: 96% 95%  7/3 controlled  10. Non-alcoholic fatty liver disease: Stable. Avoid hepatotoxic medications.             --  LFTs WNL. 11. H/o Melanoma/Persistent Mediastinal adenopathy: Per records review--was evaluated by Dr. Delton Coombes with repeat CT chest recommended but no follow up noted in records.  12. Lumbar stenosis with RLE radiculopathy: ESI by Dr. Ernestina Patches effective in the past.             --now with discomfort due to lack of activity.  --monitor for now. Tramadol prn added.   13. Sleep smart study/OSA: Has been compliant with CPAP use.  14. Low grade fever/Leucocytosis:   Resolving. Monitor for signs of infection. 15. AKI: SCr up likely due to addition of ACE/Better BP control -Encourage oral fluids 16. Left parotid nodule: Follow up with ENT after discharge.            -- 17. 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  --Will start low dose gabapentin. Increase to '100mg'$  TID- pt feels like it was more effective at that dose   18.  Left eye mildly injected with hx of Left sided numness may be irritated from inadvertant contact will add wetting agent  19. Anemia HGB 11  -possibly dilutional?,  follow  LOS: 12 days A FACE TO FACE EVALUATION WAS PERFORMED  Charlett Blake 01/26/2022, 9:34 AM

## 2022-01-26 NOTE — Progress Notes (Signed)
Occupational Therapy Session Note  Patient Details  Name: Shelby Morgan MRN: [id number] Date of Birth: [birth year]  Today's Date: 01/26/2022 OT Individual Time: 1045-1200 OT Individual Time Calculation (min): 75 min    Short Term Goals: Week 2:  OT Short Term Goal 1 (Week 2): STG= LTG  Skilled Therapeutic Interventions/Progress Updates:    Upon OT arrival, pt seated in w/c. Pt reports no pain and was agreeable to OT treatment session. Pt performs functional mobility w/c level with supervision from her room to bathroom with tub to complete tub transfer training. Pt described her bathroom setup and performed 4 transfers safely with CGA. Two were completed with w/c nearby and 2 were completed without DME. Therapist recommends pt obtain a shower chair prior to discharge for safe transition home and pt verbalizes understanding. Pt propels herself to rehab apartment to engage in IADL task with RW. Pt completes sit to stand transfer with RW and CGA and ambulates with CGA to pantry to retrieve 5 items, 1 at a time and transport them to countertop. Pt completes at a slightly quick pace requiring verbal cues to slow down. Pt no LOB and was able to retrieve items and return items to pantry one at a time without seated rest break. Pt completes task with CGA. Pt performs stand to sit transfer with CGA to take seated rest break. Pt completes sit to stand transfer with RW and CGA and ambulates to refrigerator, opens refrigerator to retrieve 5 items of fruit, one at a time and places items into fruit bowl located on counter. Pt completes with CGA and is able to reach in all planes without LOB. Pt ambulates to oven, opens oven, and simulates retrieving item from oven using B UE and demonstrates no difficulty. Verbal cues required for safe positioning of walker during task. Pt expresses relief to be able to complete tasks safely. Pt returns to her w/c and performs stand to sit transfer with CGA. Pt propels herself to main  gym with Supervision and completes sit to stand transfer with CGA and stands with Supervision to perform finger ladder with the L UE ot improve fine motor coordination during self care. Pt noted to have min difficulty to coordinate fingers climbing up the ladder and mod difficulty climbing down ladder. Pt performs 3 times with noted fatigue. Pt completes stand to sit transfer with CGA and propels herself to tabletop with Supervision. While seated at tabletop, pt shuffles deck of cards multiple times with min difficulty. Pt reports it is easier this date though indicating progress. Pt sorts cards using the L UE based on suit requiring increased time but had no errors. Pt then sorts each pile in numerical order reporting fatigue requiring rest breaks and sorts without errors. Pt propels herself to her room via w/c with supervision and completes sit to stand with CGA to ambulate to bathroom with RW and CGA to perform toilet transfer, toileting, and hand hygiene with CGA. Pt performs stand to sit transfer with CGA and was left in w/c with all needs met. Pt reports her numbness continues to limit her. Pt making progress towards stated OT goals and continues to benefit form OT services to achieve highest level of independence.   Therapy Documentation Precautions:  Precautions Precautions: Fall, Other (comment) Precaution Comments: L hemiparesis with severely impaired sensation Restrictions Weight Bearing Restrictions: No LLE Weight Bearing: Weight bearing as tolerated   Therapy/Group: Individual Therapy  [name] 01/26/2022, 12:10 PM

## 2022-01-26 NOTE — Progress Notes (Signed)
Physical Therapy Session Note  Patient Details  Name: Shelby Morgan MRN: [id number] Date of Birth: 12-23-1955  Today's Date: 01/26/2022 PT Individual Time: 0800-0857 PT Individual Time Calculation (min): 57 min   Short Term Goals: Week 2:  PT Short Term Goal 1 (Week 2): = to LTGs based on ELOS  Skilled Therapeutic Interventions/Progress Updates:      Pt sitting in w/c at the sink completing some self care tasks. She reports some exacerbation of chronic LBP as well as her gout pain.   RN entering room for morning Rx. During this, we discussed DC planning as weekend notes indicated that she may be DC to son's home in [place] rather than with her mother in [place]. Pt reporting that this is unlikely, that she hopes to stay in [place]. She also hopes to return to her home and have her mother provide PRN A rather than 24/7 supervision. Discussed PT goals of supervision and anticipate recommending 24/7 which pt was understanding of. Also recommended that she have her mother come in for family education/training either tomorrow (7/4) or Wednesday (7/5). Pt reports her mother does not drive well in [place] and will require a neighbor to drive her - she will let OT know in PM of time.   Pt then requesting female staff to assist her with dressing out of her night gown into regular clothes. Female NT called to assist for this.   Pt transported to day room rehab gym for time.  Completed BERG balance testing with results outlined below. Pt with increased fear of falling during all dynamic unsupported tasks.   Patient demonstrates increased fall risk as noted by score of 34/56 on Berg Balance Scale.  (<36= high risk for falls, close to 100%; 37-45 significant >80%; 46-51 moderate >50%; 52-55 lower >25%)  Dynamic gait training with no AD and CGA/minA. Forward/backward walking and side stepping both directions. 4x16ft each. Gait mildly ataxic with inconsistent step lengths, adequate foot clearance during  side stepping.   Concluded session seated in w/c, all needs met with call bell in reach.   Therapy Documentation Precautions:  Precautions Precautions: Fall, Other (comment) Precaution Comments: L hemiparesis with severely impaired sensation Restrictions Weight Bearing Restrictions: No LLE Weight Bearing: Weight bearing as tolerated General:    Balance Balance Assessed: Yes Standardized Balance Assessment Standardized Balance Assessment: Berg Balance Test Berg Balance Test Sit to Stand: Able to stand  independently using hands Standing Unsupported: Able to stand 2 minutes with supervision Sitting with Back Unsupported but Feet Supported on Floor or Stool: Able to sit safely and securely 2 minutes Stand to Sit: Controls descent by using hands Transfers: Able to transfer with verbal cueing and /or supervision Standing Unsupported with Eyes Closed: Able to stand 10 seconds with supervision Standing Ubsupported with Feet Together: Needs help to attain position but able to stand for 30 seconds with feet together From Standing, Reach Forward with Outstretched Arm: Can reach forward >5 cm safely (2") From Standing Position, Pick up Object from Floor: Able to pick up shoe, needs supervision From Standing Position, Turn to Look Behind Over each Shoulder: Looks behind one side only/other side shows less weight shift Turn 360 Degrees: Needs close supervision or verbal cueing Standing Unsupported, Alternately Place Feet on Step/Stool: Able to complete >2 steps/needs minimal assist Standing Unsupported, One Foot in Front: Able to plae foot ahead of the other independently and hold 30 seconds Standing on One Leg: Able to lift leg independently and hold equal to  or more than 3 seconds Total Score: 34/56  Therapy/Group: Individual Therapy  Alger Simons 01/26/2022, 7:29 AM

## 2022-01-26 NOTE — Progress Notes (Signed)
Occupational Therapy Session Note  Patient Details  Name: Shelby Morgan MRN: [id number] Date of Birth: July 18, 1956  Today's Date: 01/26/2022 OT Individual Time: 1300-1400 OT Individual Time Calculation (min): 60 min    Short Term Goals: Week 2:  OT Short Term Goal 1 (Week 2): STG= LTG  Skilled Therapeutic Interventions/Progress Updates:      Pt received in wc ready for therapy.  Discussed plan for session to focus on L arm coordination (she has active movement but limited sensation).  She discussed her decreased grasp strength mostly due to limited finger sensation. Pt self propelled to gym and transferred to mat CGA to close S.   Sensory stimulation: Musician over LUE  (can feel the vibration, but less on L vs R arm) -weight bearing over BUE with leaning forward onto hard bolster (weight shifting side to side over hands, WB on L only, pushing into hands as she pushed from sit to stand)  BUE coordination: -AROM of arms challenging proprioception moving arms out to sides out of line of vision following cues (flip hands over and back)then crossing arms back and forth in front of body -clothespins reaching L across midline to attach to dowels - picking up and releasing laundry basket handles in standing 10 x using smooth coordination, placing basket onto floor  L forearm/grasp strength: -2# hand wt wrist ext, flex and rotation 10x for 2 sets  -squeezing waterout of bottle to "clean table" and then wiping table down with L hand using a cloth She did well squeezing bottle handle. Repeated activity several times to strengthen grasp.  Pt then transferred back to wc to self propel back to room.  Close S to stand pivot to bed and pt doffed shoes and got in bed. NT with patient.   Therapy Documentation Precautions:  Precautions Precautions: Fall, Other (comment) Precaution Comments: L hemiparesis with severely impaired sensation Restrictions Weight Bearing Restrictions: No LLE  Weight Bearing: Weight bearing as tolerated   Pain: Pain Assessment Pain Score: 4  - gout in L foot, RN aware ADL: ADL Eating: Minimal assistance Where Assessed-Eating: Chair Grooming: Supervision/safety Where Assessed-Grooming: Sitting at sink Upper Body Bathing: Minimal assistance Where Assessed-Upper Body Bathing: Shower Lower Body Bathing: Moderate assistance Where Assessed-Lower Body Bathing: Shower Upper Body Dressing: Moderate assistance Where Assessed-Upper Body Dressing: Wheelchair Lower Body Dressing: Maximal assistance Where Assessed-Lower Body Dressing: Wheelchair Toileting: Moderate assistance Where Assessed-Toileting: Glass blower/designer: Psychiatric nurse Method: Arts development officer: Energy manager: Moderate assistance Social research officer, government: Moderate assistance Social research officer, government Method: Brewing technologist ADL Comments: Patient with absent sensation in LUE - yet frequent attempts to support self with left hand - cannot feel when hand slips from surface.     Therapy/Group: Individual Therapy  [place] 01/26/2022, 12:45 PM

## 2022-01-26 NOTE — Progress Notes (Signed)
Orthopedic Tech Progress Note Patient Details:  Shelby Morgan 1956/02/08 [id number]  Called in order to [employer] for a SANDAL   Patient ID: Shelby Morgan, female   DOB: [birth year], 66 y.o.   MRN: [id number]  Janit Pagan 01/26/2022, 5:40 PM

## 2022-01-26 NOTE — Progress Notes (Signed)
Patient ID: Shelby Morgan, female   DOB: 1956/03/12, 66 y.o.   MRN: [id number]  SW received phone call from patient family member over the weekend to inform SW that the patient will be unable to discharge to patients mother's home. Family reports that they have concerns with patient's mothers health. Family has agreed to check on patient a few times a day if patient is able to discharge home. Patient mother has agreed to still transport the patient to OP therapies. Patient mother having a difficult arranging transportation to attend family education. Patient son has offered the patient to stay in [place] with him, patient does not prefer this discharge plan. Sw will discuss with team. No additional questions or concerns.

## 2022-01-27 LAB — GLUCOSE, CAPILLARY
Glucose-Capillary: 177 mg/dL — ABNORMAL HIGH (ref 70–99)
Glucose-Capillary: 150 mg/dL — ABNORMAL HIGH (ref 70–99)
Glucose-Capillary: 147 mg/dL — ABNORMAL HIGH (ref 70–99)
Glucose-Capillary: 172 mg/dL — ABNORMAL HIGH (ref 70–99)

## 2022-01-27 NOTE — Progress Notes (Signed)
Patient has home CPAP and places herself on without assistance. RT will monitor as needed.

## 2022-01-27 NOTE — Progress Notes (Signed)
PROGRESS NOTE   Subjective/Complaints: Foot doing better  ROS-neg for CP, SOB, N/V/D, chills, fever  Objective:   No results found. Recent Labs    01/26/22 0746  WBC 13.0*  HGB 12.6  HCT 38.2  PLT 556*     No results for input(s): "NA", "K", "CL", "CO2", "GLUCOSE", "BUN", "CREATININE", "CALCIUM" in the last 72 hours.   Intake/Output Summary (Last 24 hours) at 01/27/2022 0915 Last data filed at 01/26/2022 2300 Gross per 24 hour  Intake 657 ml  Output --  Net 657 ml         Physical Exam: Vital Signs Blood pressure 121/72, pulse 70, temperature (!) 97.5 F (36.4 C), resp. rate 15, height '5\' 2"'$  (1.575 m), weight 98.2 kg, SpO2 96 %.    General: No acute distress Mood and affect are appropriate Heart: Regular rate and rhythm no rubs murmurs or extra sounds Lungs: Clear to auscultation, breathing unlabored, no rales or wheezes Abdomen: Positive bowel sounds, soft nontender to palpation, nondistended Extremities: No clubbing, cyanosis, or edema Skin: No evidence of breakdown, no evidence of rash   Neuro: CN II-XII intact, motor strength 4/5 in right and 3-/5 left deltoid, bicep, tricep, grip. 5/5 RLE, 4/5 LLE Sensory: Normal to LT and propioception in Rt upper, absent propio and reduced LT sensation LUE and reduced LT LLE  . Musculoskeletal: FROM all 4 extremities.  No joint swelling, pain with passive dorsiflexion Right ankle. Tenderness Left 1st MTP joint, mild pain with ROM, No redness or swelling of Lt great toe.   Assessment/Plan: 1. Functional deficits which require 3+ hours per day of interdisciplinary therapy in a comprehensive inpatient rehab setting. Physiatrist is providing close team supervision and 24 hour management of active medical problems listed below. Physiatrist and rehab team continue to assess barriers to discharge/monitor patient progress toward functional and medical goals  Care  Tool:  Bathing    Body parts bathed by patient: Right arm, Left arm, Chest, Abdomen, Front perineal area, Buttocks, Right upper leg, Left upper leg, Right lower leg, Face, Left lower leg     Body parts n/a: Left lower leg, Right upper leg, Left upper leg, Right lower leg, Buttocks, Front perineal area   Bathing assist Assist Level: Contact Guard/Touching assist     Upper Body Dressing/Undressing Upper body dressing   What is the patient wearing?: Bra, Pull over shirt    Upper body assist Assist Level: Set up assist    Lower Body Dressing/Undressing Lower body dressing      What is the patient wearing?: Underwear/pull up, Pants     Lower body assist Assist for lower body dressing: Contact Guard/Touching assist     Toileting Toileting Toileting Activity did not occur (Clothing management and hygiene only): N/A (no void or bm)  Toileting assist Assist for toileting: Contact Guard/Touching assist     Transfers Chair/bed transfer  Transfers assist     Chair/bed transfer assist level: Contact Guard/Touching assist     Locomotion Ambulation   Ambulation assist   Ambulation activity did not occur: Safety/medical concerns  Assist level: Minimal Assistance - Patient > 75% Assistive device: Walker-rolling Max distance: 62f   Walk 10  feet activity   Assist     Assist level: Minimal Assistance - Patient > 75% Assistive device: Walker-rolling   Walk 50 feet activity   Assist Walk 50 feet with 2 turns activity did not occur: Safety/medical concerns  Assist level: Minimal Assistance - Patient > 75% Assistive device: Walker-rolling    Walk 150 feet activity   Assist Walk 150 feet activity did not occur: Safety/medical concerns         Walk 10 feet on uneven surface  activity   Assist Walk 10 feet on uneven surfaces activity did not occur: Safety/medical concerns         Wheelchair     Assist Is the patient using a wheelchair?: Yes (for  transportation) Type of Wheelchair: Manual    Wheelchair assist level: Dependent - Patient 0%      Wheelchair 50 feet with 2 turns activity    Assist        Assist Level: Dependent - Patient 0%   Wheelchair 150 feet activity     Assist      Assist Level: Dependent - Patient 0%   Blood pressure 121/72, pulse 70, temperature (!) 97.5 F (36.4 C), resp. rate 15, height '5\' 2"'$  (1.575 m), weight 98.2 kg, SpO2 96 %.  Medical Problem List and Plan: 1. 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              -patient may shower             -ELOS/Goals: 7/6 team conf in am , discharging to mom's house              -Cont CIR PT, OT 2.  Antithrombotics: -DVT/anticoagulation:  Pharmaceutical: Lovenox             -antiplatelet therapy: DAPT X 3 weeks followed by plavix alone on 01/27/22 3. Pain Management: tylenol prn.  RIght achilles pain will add voltaren gel and night splint Gout Left 1st MTP- start medrol dosepack  7/1 Consistent from yesterday, continue Medrol dose pack 7/2 Symptoms worse today, pain with weight bearing and extension/flexion Lt great toe.  Discussed with Dr. Dagoberto Ligas, MD.  Ordered colchicine 0.6 mg daily for 3 days while continuing Medrol dose pack. Left foot xray unremarkable  4. Mood/Sleep: LCSW to follow for evaluation and support.              -antipsychotic agents: N/A 5. Neuropsych/cognition: This patient is capable of making decisions on her own behalf. 6. Skin/Wound Care: Routine pressure relief measures.  7. Fluids/Electrolytes/Nutrition: Monitor I/O. Check CMET in am.  8. T2DM: Hgb A1c- 7.1. Was on Januvia 100/Metform 1000 mg BID PTA.  --Monitor BS ac/hs and use SSI for elevated BS.              CBG (last 3)  Recent Labs    01/26/22 1638 01/26/22 2049 01/27/22 0607  GLUCAP 161* 133* 177*   Controlled metformin and tradjenta Fair control despite medrol  9. HTN:  Monitor BP TID--continue Lisinopril and Lasix             -BP goal <180/105            Vitals:   01/26/22 2010 01/27/22 0425  BP:  121/72  Pulse: 89 70  Resp: 18 15  Temp:  (!) 97.5 F (36.4 C)  SpO2: 95% 96%  7/4 controlled  10. Non-alcoholic fatty liver disease: Stable. Avoid hepatotoxic medications.             --  LFTs WNL. 11. H/o Melanoma/Persistent Mediastinal adenopathy: Per records review--was evaluated by Dr. Delton Coombes with repeat CT chest recommended but no follow up noted in records.  12. Lumbar stenosis with RLE radiculopathy: ESI by Dr. Ernestina Patches effective in the past.             --now with discomfort due to lack of activity.  --monitor for now. Tramadol prn added.   13. Sleep smart study/OSA: Has been compliant with CPAP use.  14. Low grade fever/Leucocytosis:   Resolving. Monitor for signs of infection. 15. AKI: SCr up likely due to addition of ACE/Better BP control -Encourage oral fluids 16. Left parotid nodule: Follow up with ENT after discharge.            -- 17. 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  --Will start low dose gabapentin. Increase to '100mg'$  TID- pt feels like it was more effective at that dose   18.  Left eye mildly injected with hx of Left sided numness may be irritated from inadvertant contact will add wetting agent  19. Anemia HGB 11  -possibly dilutional?,  follow  LOS: 13 days A FACE TO FACE EVALUATION WAS PERFORMED  Charlett Blake 01/27/2022, 9:15 AM

## 2022-01-27 NOTE — Progress Notes (Signed)
Occupational Therapy Session Note  Patient Details  Name: Shelby Morgan MRN: [id number] Date of Birth: 1956-06-05  Today's Date: 01/27/2022 OT Individual Time: 3220-2542 OT Individual Time Calculation (min): 60 min    Short Term Goals: Week 1:  OT Short Term Goal 1 (Week 1): Patient will bathe her upper body with min assistance using BUE OT Short Term Goal 1 - Progress (Week 1): Met OT Short Term Goal 2 (Week 1): Patient will bathe lower body with min assist using BUE OT Short Term Goal 2 - Progress (Week 1): Met OT Short Term Goal 3 (Week 1): Patient will transfer to toilet with min assist OT Short Term Goal 3 - Progress (Week 1): Met OT Short Term Goal 4 (Week 1): Patient will demonstrate sufficient dynamic stand tolerance and balance for lower body dressing, perianal care with min assist OT Short Term Goal 4 - Progress (Week 1): Met OT Short Term Goal 5 (Week 1): Patient will use BUE to cut up food or complete other bimanual tasks with cueing and supervision OT Short Term Goal 5 - Progress (Week 1): Met Week 2:  OT Short Term Goal 1 (Week 2): STG= LTG  Skilled Therapeutic Interventions/Progress Updates:    The pt was seated at w/c LOF, pt indicated that she needed to go to the restroom.  The pt was instructed to slow down when coming from sit to stand to improve safe task initiation. The pt was able to complete coming from sit to stand using the RW for ambulating to the rest room with CGA, she was instructed to make certain she felt her leg against the base of the commode and to reach for the grab bars for additional safety while transferring to the commode with SBA. The pt was able to doff her LB clothing items for toileting with close S for underwear and pants with additional time.  The pt was able to donn her underwear and pants at the same LOF using the RW for additional balance. The pt returned to her living quarters and was able to come from standing to sitting at w/c LOF with close  S.  The pt rolled to the sink area incorporating BLE to the sink to wash her hand with SBA.  The pt completed UB exercises using a 2lb dumb bell for 2 sets of 10 bicep curls, shld flexion and horizontal abduction with 1 rest break between sets. The pt was instructed to incorporate relaxation breathing to improve compliance.  The pt was able to come from sit to stand  with close S using the RW for safety, she completed a BADL related task in folding while trowels while standing to improve balance and endurance for functional task performance with focus on safety awareness. The pt returned to the recliner with her call light and bedside table within reach, she had no c/o pain this treatment session and all additional needs were addressed.   Therapy Documentation Precautions:  Precautions Precautions: Fall, Other (comment) Precaution Comments: L hemiparesis with severely impaired sensation Restrictions Weight Bearing Restrictions: No LLE Weight Bearing: Weight bearing as tolerated  Therapy/Group: Individual Therapy  Yvonne Kendall 01/27/2022, 12:36 PM

## 2022-01-27 NOTE — Progress Notes (Signed)
Physical Therapy Session Note  Patient Details  Name: Shelby Morgan MRN: [id number] Date of Birth: 1956/07/02  Today's Date: 01/27/2022 PT Individual Time: [phone] and [id number] PT Individual Time Calculation (min): 56 min and 78 min  Short Term Goals: Week 2:  PT Short Term Goal 1 (Week 2): = to LTGs based on ELOS  Skilled Therapeutic Interventions/Progress Updates:    Session 1: Pt received sitting in w/c at sink performing oral care and agreeable to therapy session. Pt confirms that her plan is now to try and D/C to her own home as opposed to her mother's house and therapist inquiring about the plan and safety concerns.  Discussed the following plan with pt in agreement:  - pt's concern that her mother's house has 2 step-ups inside the house to get to bathroom and bedroom whereas the pt's house is all 1 level once she is inside - confirm that pt's steps to enter her house have bilateral handrails (even though they are wide it would allow her to use R UE support on each handrail) - states she could speak with her mother about her coming to stay the night to provide 24hr support, at least initially - pt confirms that her preference is for OPPT instead of HHPT and therapist confirms OPPT would be recommended based on pt's CLOF - pt confirms she feels confident and comfortable with current D/C date given this new plan  Seated in wheelchair, pt doffs night gown and dons clothes for today with only close supervision during sit<>stand transfers with pt close to the footboard of bed for UE support to maintain balance. Stand pivot w/c<>EOB using UE support on bed as needed with close supervision.    Transported to/from gym in w/c for pain management and time management. Sit<>stands using RW with close supervision for safety during session. Therapist provided pt with walker bag and educated on proper use.   Gait training ~121f to/from ADL apartment using RW with close supervision and only 1x  minor LOB with pt able to recover using AD support without hands-on assist from therapist - continues to have very intentional L foot placement on initial contact with slightly longer L LE step length compared to R.   In ADL apartment kitchen, practiced using walker bag and RW to collect items from cabinets with pt demonstrating excellent recall/carryover of prior education on safe AD management in the kitchen - discussed how to use cups with lids and food storage containers with lids to allow pt to transport items in her walker bag in order to allow her to keep B UE support on RW. Pt performed all of these activities with only close supervision from therapist.   Transported back to her room and pt left seated in w/c with needs in reach.   Session 2: Pt received sitting in w/c and agreeable to therapy session.  Transported to/from gym in w/c for time management and pain management. Simulated ambulatory car transfer using RW x2 (sedan height) using RW with close supervision for safety - requires max cuing to turn fully and sit down prior to placing LEs in car. Gait training up/down ramp using RW with close supervision - pt demos good AD management with intermittent cuing. Gait training ~1574fto main therapy gym using RW with close supervision - continues to have slightly longer L LE step length with heel landing but no instances of instability nor LOB.  Stair navigation training ascending/descending 4 steps x3 (6" height) using both handrails but only  R UE support on one each direction to simulate home set-up with CGA progressed to close supervision - with step-to pattern leading with R LE on ascent and pt turning sideways on descent resting hips back against the railing for support leading with RLE on descent also. Pt reports feeling much more confident with the stairs. Educated her on having family assist with AD management on stairs and pt demonstrates understanding of how to ask for this  assistance.  Pt confirms that her mother is willing to stay at pt's house to provide 24hr support. Discussed importance of monitoring her BP daily at home.  Pt reports she does have to ambulate through grass for short distance from the car to her stairs to enter her home. Transported outside in w/c.   Outside stair navigation 8 steps using only R UE support on HR with CGA for safety - pt progresses to reciprocal pattern on ascent with good control and balance although continues with step-to leading with R LE using sideways technique. Pt demos good recall of how to ask therapist to manage AD on stairs for her to simulate her asking her family for this support.  Gait training outside through grass using RW to simulate home entry with pt demoing safe AD management walking at a slow, controlled pace and keeping RW within her BOS for balance support as needed - provided CGA for safety but no instances of LOB.  Transported back up to CIR.   Performed 1 set of the following exercises and educated pt on how to set them up safely in the home with her mother's assistance. Provided pt with HEP printout.  Access Code: [email or web address] URL: [email or web address] Date: 01/27/2022 Prepared by: Page Spiro  Exercises - Sit to Stand Without Arm Support  - 1 x daily - 7 x weekly - 2 sets - 10 reps - Side Stepping with Counter Support  - 1 x daily - 7 x weekly - 2 sets - 10 reps - Standing Balance in Corner with Eyes Closed  - 1 x daily - 7 x weekly - 2 sets - 30 seconds hold - Standing March with Counter Support  - 1 x daily - 7 x weekly - 2 sets - 10 reps - Walking  - 1 x daily - 7 x weekly - 3 sets  At end of session, pt left seated in w/c with needs in reach, chair alarm on, and meal tray set-up.    Therapy Documentation Precautions:  Precautions Precautions: Fall, Other (comment) Precaution Comments: L hemiparesis with severely impaired sensation Restrictions Weight Bearing Restrictions:  No LLE Weight Bearing: Weight bearing as tolerated   Pain:  Session 1: Pt pre-medicated for L great toe, R heel, and low back pain - provided w/c transport to/from gym to avoid exacerbation in pain otherwise no interventions needed during session.  Session 2: Continues to report she was premedicated for L great toe and low back pain primarily - provided pt with wheelchair transport to avoid increase in pain during session but otherwise pain does not limit her participation.    Therapy/Group: Individual Therapy  Tawana Scale , PT, DPT, NCS, CSRS 01/27/2022, 7:50 AM

## 2022-01-27 NOTE — Progress Notes (Signed)
Orthopedic Tech Progress Note Patient Details:  Shelby Morgan 1956-01-28 [id number]  MD said this shoe should be fine to used for patient   Ortho Devices Type of Ortho Device: Darco shoe Ortho Device/Splint Location: LLE Ortho Device/Splint Interventions: Ordered   Post Interventions Patient Tolerated: Well Instructions Provided: [facility] [place] 01/27/2022, 11:28 AM

## 2022-01-27 NOTE — Progress Notes (Signed)
Nutrition Education Note   RD received a consult for diet education regarding low purine diet for her gout. Provided pt with "Low Purine/Purine Restricted Nutrition Therapy" handout from the [employer]. Reviewed foods low in purines and high in purines. Discussed the effects that large quantity of foods high in purines can have on her body. Pt provided example of foods that she eats at home compared to the list of foods on the list. Teach back method used.  Expect good compliance.  Current diet order is Heart Healthy/Carb Modified, pt is consuming approximately 100% of her meals at this time. Labs and medications reviewed.  No further nutrition interventions warranted at this time. If additional nutrition issues arise, please re-consult RD.  Hermina Barters RD, LDN Clinical Dietitian See Shea Evans for contact information.

## 2022-01-27 NOTE — Progress Notes (Signed)
PRN tylenol given at 2136 for complaint of chronic lower back pain. Reports toes are "some" better. Slept good with CPAP in place.Shelby Morgan

## 2022-01-28 LAB — BASIC METABOLIC PANEL
Anion gap: 9 (ref 5–15)
BUN: 38 mg/dL — ABNORMAL HIGH (ref 8–23)
CO2: 26 mmol/L (ref 22–32)
Calcium: 9.8 mg/dL (ref 8.9–10.3)
Chloride: 98 mmol/L (ref 98–111)
Creatinine, Ser: 1.58 mg/dL — ABNORMAL HIGH (ref 0.44–1.00)
GFR, Estimated: 36 mL/min — ABNORMAL LOW (ref >60)
Glucose, Bld: 137 mg/dL — ABNORMAL HIGH (ref 70–99)
Potassium: 4.3 mmol/L (ref 3.5–5.1)
Sodium: 133 mmol/L — ABNORMAL LOW (ref 135–145)

## 2022-01-28 LAB — GLUCOSE, CAPILLARY
Glucose-Capillary: 138 mg/dL — ABNORMAL HIGH (ref 70–99)
Glucose-Capillary: 163 mg/dL — ABNORMAL HIGH (ref 70–99)
Glucose-Capillary: 135 mg/dL — ABNORMAL HIGH (ref 70–99)

## 2022-01-28 MED ORDER — TRAMADOL HCL 50 MG PO TABS: 50.0000 mg | ORAL_TABLET | Freq: Four times a day (QID) | ORAL | 0 refills | Status: DC | PRN

## 2022-01-28 MED ORDER — POLYVINYL ALCOHOL 1.4 % OP SOLN
1.0000 [drp] | OPHTHALMIC | 0 refills | Status: AC | PRN
Start: 2022-01-28 — End: ?

## 2022-01-28 MED ORDER — DICLOFENAC SODIUM 1 % EX GEL: 4.0000 g | Freq: Four times a day (QID) | CUTANEOUS | 0 refills | Status: DC

## 2022-01-28 MED ORDER — METFORMIN HCL ER 500 MG PO TB24: 500.0000 mg | ORAL_TABLET | Freq: Two times a day (BID) | ORAL | 0 refills | Status: DC

## 2022-01-28 MED ORDER — LINAGLIPTIN 5 MG PO TABS: 5.0000 mg | ORAL_TABLET | Freq: Every day | ORAL | 0 refills | Status: DC

## 2022-01-28 MED ORDER — ROSUVASTATIN CALCIUM 20 MG PO TABS: 20.0000 mg | ORAL_TABLET | Freq: Every day | ORAL | 1 refills | Status: AC

## 2022-01-28 MED ORDER — BUSPIRONE HCL 5 MG PO TABS: 5.0000 mg | ORAL_TABLET | Freq: Two times a day (BID) | ORAL | 0 refills | Status: DC

## 2022-01-28 MED ORDER — GABAPENTIN 100 MG PO CAPS: 100.0000 mg | ORAL_CAPSULE | Freq: Three times a day (TID) | ORAL | 0 refills | Status: DC

## 2022-01-28 MED ORDER — CLOPIDOGREL BISULFATE 75 MG PO TABS: 75.0000 mg | ORAL_TABLET | Freq: Every day | ORAL | 1 refills | Status: DC

## 2022-01-28 MED ORDER — LISINOPRIL 20 MG PO TABS: 20.0000 mg | ORAL_TABLET | Freq: Every day | ORAL | 0 refills | Status: DC

## 2022-01-28 MED ORDER — TRAZODONE HCL 50 MG PO TABS: 50.0000 mg | ORAL_TABLET | Freq: Every evening | ORAL | 0 refills | Status: DC | PRN

## 2022-01-28 MED ORDER — FUROSEMIDE 20 MG PO TABS: 20.0000 mg | ORAL_TABLET | Freq: Every morning | ORAL | 0 refills | Status: DC

## 2022-01-28 MED ORDER — PANTOPRAZOLE SODIUM 40 MG PO TBEC: 40.0000 mg | DELAYED_RELEASE_TABLET | Freq: Every day | ORAL | 0 refills | Status: AC

## 2022-01-28 NOTE — Progress Notes (Signed)
Patient ID: Shelby Morgan, female   DOB: 1955-10-31, 66 y.o.   MRN: [id number]  [place] ordered through [place]

## 2022-01-28 NOTE — Discharge Summary (Signed)
Physical Therapy Discharge Summary  Patient Details  Name: GALI SPINNEY MRN: [id number] Date of Birth: [birth year]  Today's Date: 01/28/2022 PT Individual Time: 1003-1030 and 1307-1400 PT Individual Time Calculation (min): 27 min and 53 min   Patient has met 9 of 9 long term goals due to improved activity tolerance, improved balance, improved postural control, increased strength, decreased pain, ability to compensate for deficits, functional use of  left upper extremity and left lower extremity, and improved coordination.  Patient to discharge at an ambulatory level Supervision using RW. Patient's care partner attended in-person training/education and is independent to provide the necessary physical assistance at discharge.  All goals met.  Recommendation:  Patient will benefit from ongoing skilled PT services in outpatient setting to continue to advance safe functional mobility, address ongoing impairments in dynamic standing balance, L hemibody NMR, dynamic gait training using LRAD, cardiopulmonary endurance, and minimize fall risk.  Equipment: No equipment provided - pt has RW  Reasons for discharge: treatment goals met and discharge from hospital  Patient/family agrees with progress made and goals achieved: Yes  Skilled Therapeutic Interventions/Progress Updates:  Session 1: Pt received in bathroom toileting with nurse tech and pt agreeable to therapy session therefore therapist assumed care of pt. Pt continent of bladder and performed peri-care without assist. Sit>stand from toilet to [place] with supervision. Gait out of bathroom to w/c using RW with supervision - pt tends to perform step-to pattern leading with L LE in this smaller environment requiring more controlled movements. Standing hand hygiene at sink with supervision. Gait training ~129ft x2 using RW with supervision focusing on increasing walking distance and within pain tolerance - continues to have slightly longer L step length  and pronounced L heel strike on initial contact but no instances of LOB. Pt confirms that her family is planning to attend hands-on education/training today. At end of session, pt left seated in w/c with needs in reach and chair alarm on.   Session 2: Pt received sitting in w/c with her mother, Inez Catalina, and friend, Agnus, present for family education/training and pt agreeable to therapy session.   Therapist educated family on the following: - pt's CLOF at supervision using RW for all standing/ambulating - recommendation for follow-up OPPT   Discussed D/C plan with pt and mother now reporting pt is planning to D/C to her mother's house so that her mother will feel more comfortable providing 24hr support for the patient. Pt's mother's house has ramped entrance with 2 small steps to navigate inside home to get to bedroom/bathroom.   Transported to/from gym in w/c for time management. Pt ambulated during session using RW with supervision.  Stair navigation training:  - educated family that pt will require dependent assistance to manage getting RW up/down the stairs - discussed navigating the 2 steps in pt's mother's house with plan for her mother to assist with RW and pt using B UE support on the narrow walls for balance/stability - navigated 4 steps x3 (6" height) using only R UE support on each HR to simulate entering pt's home with close supervision provided for safety - pt able to recall step-to pattern with question cuing Pt/family report no additional questions nor concerns regarding stair navigation and both report feeling comfortable with D/C plan.  Transported to ortho gym.  Gait training up/down ramp using RW to simulate entering her mother's house using RW with close supervision.  Simulated ambulatory car transfer (sedan height) using RW with pt demonstrating great carryover/recall of education from  yesterday - supervision for safety.  Pt/family report no additional questions/concerns at  this time and are excited to see how well the pt is doing. At end of session, pt left seated in w/c with needs in reach and her family present.   PT Discharge Precautions/Restrictions Precautions Precautions: Fall;Other (comment) Precaution Comments: very mild L hemiparesis with severely impaired sensation Restrictions Weight Bearing Restrictions: No Pain Pain Assessment Pain Scale: 0-10 Pain Score: 3  ("when I'm up on it I can feel it") Pain Type: Acute pain;Chronic pain Pain Location: Back Pain Orientation: Lower;Mid Pain Radiating Towards: left foot Pain Descriptors / Indicators: Sore Pain Frequency: Constant Pain Onset: On-going Pain Intervention(s): Medication (See eMAR);Rest;Relaxation;Distraction;Emotional support Pain Interference Pain Interference Pain Effect on Sleep: 2. Occasionally Pain Interference with Therapy Activities: 1. Rarely or not at all Pain Interference with Day-to-Day Activities: 1. Rarely or not at all (Pt states "I'm trying to get through what I've got to get through") Vision/Perception  Vision - History Ability to See in Adequate Light: 0 Adequate Perception Perception: Within Functional Limits Praxis Praxis: Intact  Cognition Overall Cognitive Status: Within Functional Limits for tasks assessed Arousal/Alertness: Awake/alert Orientation Level: Oriented X4 Year: 2023 Month: July Day of Week: Correct Attention: Focused;Sustained;Selective Focused Attention: Appears intact Sustained Attention: Appears intact Selective Attention: Appears intact Memory: Appears intact Awareness: Appears intact Problem Solving: Appears intact Reasoning: Appears intact Safety/Judgment: Appears intact Sensation Sensation Light Touch: Impaired Detail Central sensation comments: reports some slight residual impaired sensation in palm of R hand and on sole of R foot from prior CVA in 2022; now able to feel deep pressure in L hemibody more so proximally of arm and  leg (more near sholder and thigh with less to absent feeling distally) Light Touch Impaired Details: Impaired LUE;Impaired LLE Hot/Cold: Impaired Detail Hot/Cold Impaired Details: Impaired LLE;Impaired LUE Proprioception: Impaired Detail Proprioception Impaired Details: Impaired LUE;Impaired LLE Stereognosis: Not tested Coordination Gross Motor Movements are Fluid and Coordinated: No Coordination and Movement Description: mild L hemiparesis but more impaired sensation impacting gross and fine motor movements Motor  Motor Motor: Other (comment) Motor - Discharge Observations: very mild L hemiparesis with more impaired sensation  Mobility Bed Mobility Bed Mobility: Supine to Sit;Sit to Supine Supine to Sit: Independent with assistive device Sit to Supine: Independent with assistive device Transfers Transfers: Sit to Stand;Stand to Sit;Stand Pivot Transfers Sit to Stand: Supervision/Verbal cueing Stand to Sit: Supervision/Verbal cueing Stand Pivot Transfers: Supervision/Verbal cueing Stand Pivot Transfer Details: Verbal cues for precautions/safety Transfer (Assistive device): Rolling walker Locomotion  Gait Ambulation: Yes Gait Assistance: Supervision/Verbal cueing Gait Distance (Feet): 150 Feet Assistive device: Rolling walker Gait Assistance Details: Verbal cues for safe use of DME/AE Gait Gait: Yes Gait Pattern: Impaired Gait Pattern: Decreased stance time - left;Step-through pattern (with fatigue starts to flex forward and increase WBing through AD) Gait velocity: improved Stairs / Additional Locomotion Stairs: Yes Stairs Assistance: Supervision/Verbal cueing Stair Management Technique: Two rails;Step to pattern (using R UE support on each HR) Number of Stairs: 12 Height of Stairs: 6 Ramp: Supervision/Verbal cueing (using RW) Curb: Contact Guard/Touching assist (using RW) Wheelchair Mobility Wheelchair Mobility: No  Trunk/Postural Assessment  Cervical  Assessment Cervical Assessment: Within Functional Limits Thoracic Assessment Thoracic Assessment: Exceptions to [facility] (rounded shoulders) Lumbar Assessment Lumbar Assessment: Exceptions to [facility] (mild posterior pelvic tilt in sitting) Postural Control Postural Control: Within Functional Limits (using RW support for basic functional mobility tasks)  Balance Balance Balance Assessed: Yes Standardized Balance Assessment Standardized Balance Assessment: Merrilee Jansky Balance  Test Merrilee Jansky from 01/26/22) Merrilee Jansky Balance Test Sit to Stand: Able to stand  independently using hands Standing Unsupported: Able to stand 2 minutes with supervision Sitting with Back Unsupported but Feet Supported on Floor or Stool: Able to sit safely and securely 2 minutes Stand to Sit: Controls descent by using hands Transfers: Able to transfer with verbal cueing and /or supervision Standing Unsupported with Eyes Closed: Able to stand 10 seconds with supervision Standing Ubsupported with Feet Together: Needs help to attain position but able to stand for 30 seconds with feet together From Standing, Reach Forward with Outstretched Arm: Can reach forward >5 cm safely (2") From Standing Position, Pick up Object from Floor: Able to pick up shoe, needs supervision From Standing Position, Turn to Look Behind Over each Shoulder: Looks behind one side only/other side shows less weight shift Turn 360 Degrees: Needs close supervision or verbal cueing Standing Unsupported, Alternately Place Feet on Step/Stool: Able to complete >2 steps/needs minimal assist Standing Unsupported, One Foot in Front: Able to plae foot ahead of the other independently and hold 30 seconds Standing on One Leg: Able to lift leg independently and hold equal to or more than 3 seconds Total Score: 34 Static Sitting Balance Static Sitting - Balance Support: Feet supported Static Sitting - Level of Assistance: 6: Modified independent (Device/Increase time) Dynamic Sitting  Balance Dynamic Sitting - Balance Support: Feet supported Dynamic Sitting - Level of Assistance: 6: Modified independent (Device/Increase time) Static Standing Balance Static Standing - Balance Support: During functional activity;Bilateral upper extremity supported Static Standing - Level of Assistance: 5: Stand by assistance;6: Modified independent (Device/Increase time) Dynamic Standing Balance Dynamic Standing - Balance Support: During functional activity;Bilateral upper extremity supported Dynamic Standing - Level of Assistance: 5: Stand by assistance Extremity Assessment      RLE Assessment RLE Assessment: Within Functional Limits Active Range of Motion (AROM) Comments: WFL/WNL RLE Strength Right Hip Flexion: 5/5 Right Knee Flexion: 5/5 Right Knee Extension: 5/5 Right Ankle Dorsiflexion: 5/5 Right Ankle Plantar Flexion: 5/5 LLE Assessment LLE Assessment: Exceptions to [facility] Active Range of Motion (AROM) Comments: WFL General Strength Comments: pt states she now only feels "tingling" in L LE but no "muscle pain" LLE Strength Left Hip Flexion: 4-/5 Left Knee Flexion: 4/5 Left Knee Extension: 4+/5 Left Ankle Dorsiflexion: 4/5 Left Ankle Plantar Flexion: 4/5    Hansel Devan M Adra Shepler , PT, DPT, NCS, CSRS 01/28/2022, 7:54 AM

## 2022-01-28 NOTE — Progress Notes (Signed)
Occupational Therapy Session Note  Patient Details  Name: Shelby Morgan MRN: [id number] Date of Birth: December 28, 1955  Today's Date: 01/28/2022 OT Individual Time: 1100-1203 session 1 OT Individual Time Calculation (min): 63 min  Session 2: 1430-1533   Short Term Goals: Week 2:  OT Short Term Goal 1 (Week 2): STG= LTG  Skilled Therapeutic Interventions/Progress Updates:  Session 1: Pt greeted seated in w/c with mother and friend present for family ed. pt agreeable to OT intervention. Session focus on BADL reeducation, functional mobility, dynamic standing balance and decreasing overall caregiver burden.        General education provided on pts assist level for ADL participation ( overall supervision- MODI). DC plan oscillating between whether or not pt to DC to her home or her mothers home. Pt demonstrated shower transfer to tub shower with TTB with Rw and supervision. Education provided on pt likely needing assist to don bath mit on L hand. Pt to find her own TTB.  Pt completed functional ambulation in hallway with supervision, pt demo'ed bed mobility with supervision as well as ambulatory furniture transfer with supervision with Rw. Education provided on pt being able to ambulate into kitchen to retrieve food items using walker bag for transport however pt would need assist to open items.  After her mother observed pts progress mother and pt now agreeable to DC to her mothers home for 24/7 supervision as pts mother can ONLY provide supervision d/t physical limitations. Did provided general fall prevention education such as decreasing clutter, removing rugs and having access to local fire dept number as needed in case fall does occur. Pt completed ambulatory toilet transfer with supervision completing 3/3 toileting tasks with supervision, mother observed all toileting tasks.  pt left seated in w/c with all needs within reach and family present.                     Session 2: Pt greeted seated in w/c,  pt agreeable to OT intervention. Pt completed necessary DC assessments ( see OT DC). Pt transported to gym from w/c with total A for time mgmt. Issued pt theraputty HEP as indicated below with level 2 theraputty:                1. Roll putty into a ball. 2. Make into a pancake. 3. Roll into a log. 4. Pinch along log with first finger and thumb. 5. Make into a ball. 6. Roll it back into a log. 7. Pinch using thumb and side of first finger. 8. Roll it into a ball, then flatten into a pancake. 9. Using your fingers, make putty into a mountain. 10. Hide several coins or marbles in putty, then pick them out one by one. Reviewed various other [place] therex that pt could complete at home such as: -Pick up 5 small objects (coins, marbles, paperclips, beads, etc) one at a time and hold them in hand, then place them one by one onto the table. ? -Pick up small objects and place them into a coup or container. ? -Thread buttons or beads onto a string. ? -Place clothespins onto the edge of a cup, can, or container. ? -Play card games. Practice shuffling and dealing cards. Flip cards over onto table one by one. ? -Practice screwing and unscrewing nuts/bolts. ? -[employer] (checkers, coins, etc) onto table. ? -Paperwork: practice folding paper, stuffing envelopes, addressing envelopes, stapling, using paperclips, and tape. ? -Practice lacing and tying a shoelace. ? -With  tweezers, pick up small objects and put into a small container. Try sorting beads or buttons.  Pt transported back to room with total A where pt completed ambulatory toilet transfer with Rw and supervision, supervision for 3/3 toileting tasks. Pt left seated in w/c with alarm belt activated and all needs within reach.  Therapy Documentation Precautions:  Precautions Precautions: Fall, Other (comment) Precaution Comments: very mild L hemiparesis with severely impaired sensation Restrictions Weight Bearing Restrictions: No LLE  Weight Bearing: Weight bearing as tolerated  Pain:no pain reported during either session   Therapy/Group: Individual Therapy  [name] 01/28/2022, 12:23 PM

## 2022-01-28 NOTE — Progress Notes (Signed)
PROGRESS NOTE   Subjective/Complaints: Foot doing better overall, some back pain last noc  ROS-neg for CP, SOB, N/V/D, chills, fever  Objective:   No results found. Recent Labs    01/26/22 0746  WBC 13.0*  HGB 12.6  HCT 38.2  PLT 556*     No results for input(s): "NA", "K", "CL", "CO2", "GLUCOSE", "BUN", "CREATININE", "CALCIUM" in the last 72 hours.   Intake/Output Summary (Last 24 hours) at 01/28/2022 0947 Last data filed at 01/28/2022 0744 Gross per 24 hour  Intake 1013 ml  Output 1 ml  Net 1012 ml         Physical Exam: Vital Signs Blood pressure 130/73, pulse 66, temperature (!) 97.5 F (36.4 C), resp. rate 15, height '5\' 2"'$  (1.575 m), weight 98.2 kg, SpO2 96 %.    General: No acute distress Mood and affect are appropriate Heart: Regular rate and rhythm no rubs murmurs or extra sounds Lungs: Clear to auscultation, breathing unlabored, no rales or wheezes Abdomen: Positive bowel sounds, soft nontender to palpation, nondistended Extremities: No clubbing, cyanosis, or edema Skin: No evidence of breakdown, no evidence of rash   Neuro: CN II-XII intact, motor strength 4/5 in right and 3/5 left deltoid, bicep, tricep, grip. 5/5 RLE, 4/5 LLE Sensory: Normal to LT and propioception in Rt upper, absent propio and reduced LT sensation LUE and reduced LT LLE  . Musculoskeletal: FROM all 4 extremities.  No joint swelling, pain with passive dorsiflexion Right ankle. Tenderness Left 1st MTP joint, mild pain with ROM, Assessment/Plan: 1. Functional deficits which require 3+ hours per day of interdisciplinary therapy in a comprehensive inpatient rehab setting. Physiatrist is providing close team supervision and 24 hour management of active medical problems listed below. Physiatrist and rehab team continue to assess barriers to discharge/monitor patient progress toward functional and medical goals  Care  Tool:  Bathing    Body parts bathed by patient: Right arm, Left arm, Chest, Abdomen, Front perineal area, Buttocks, Right upper leg, Left upper leg, Right lower leg, Face, Left lower leg     Body parts n/a: Left lower leg, Right upper leg, Left upper leg, Right lower leg, Buttocks, Front perineal area   Bathing assist Assist Level: Contact Guard/Touching assist     Upper Body Dressing/Undressing Upper body dressing   What is the patient wearing?: Bra, Pull over shirt    Upper body assist Assist Level: Set up assist    Lower Body Dressing/Undressing Lower body dressing      What is the patient wearing?: Underwear/pull up, Pants     Lower body assist Assist for lower body dressing: Contact Guard/Touching assist     Toileting Toileting Toileting Activity did not occur (Clothing management and hygiene only): N/A (no void or bm)  Toileting assist Assist for toileting: Contact Guard/Touching assist     Transfers Chair/bed transfer  Transfers assist     Chair/bed transfer assist level: Contact Guard/Touching assist     Locomotion Ambulation   Ambulation assist   Ambulation activity did not occur: Safety/medical concerns  Assist level: Minimal Assistance - Patient > 75% Assistive device: Walker-rolling Max distance: 55f   Walk 10 feet activity  Assist     Assist level: Minimal Assistance - Patient > 75% Assistive device: Walker-rolling   Walk 50 feet activity   Assist Walk 50 feet with 2 turns activity did not occur: Safety/medical concerns  Assist level: Minimal Assistance - Patient > 75% Assistive device: Walker-rolling    Walk 150 feet activity   Assist Walk 150 feet activity did not occur: Safety/medical concerns         Walk 10 feet on uneven surface  activity   Assist Walk 10 feet on uneven surfaces activity did not occur: Safety/medical concerns         Wheelchair     Assist Is the patient using a wheelchair?: Yes (for  transportation) Type of Wheelchair: Manual    Wheelchair assist level: Dependent - Patient 0%      Wheelchair 50 feet with 2 turns activity    Assist        Assist Level: Dependent - Patient 0%   Wheelchair 150 feet activity     Assist      Assist Level: Dependent - Patient 0%   Blood pressure 130/73, pulse 66, temperature (!) 97.5 F (36.4 C), resp. rate 15, height '5\' 2"'$  (1.575 m), weight 98.2 kg, SpO2 96 %.  Medical Problem List and Plan: 1. 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              -patient may shower             -ELOS/Goals: 7/6 team conf in am , discharging to mom's house              -Cont CIR PT, OT 2.  Antithrombotics: -DVT/anticoagulation:  Pharmaceutical: Lovenox             -antiplatelet therapy: DAPT X 3 weeks followed by plavix alone on 01/27/22 3. Pain Management: tylenol prn.  RIght achilles pain will add voltaren gel and night splint Gout Left 1st MTP- completed medrol dosepack   Left foot xray unremarkable  4. Mood/Sleep: LCSW to follow for evaluation and support.              -antipsychotic agents: N/A 5. Neuropsych/cognition: This patient is capable of making decisions on her own behalf. 6. Skin/Wound Care: Routine pressure relief measures.  7. Fluids/Electrolytes/Nutrition: Monitor I/O. Check CMET in am.  8. T2DM: Hgb A1c- 7.1. Was on Januvia 100/Metform 1000 mg BID PTA.  --Monitor BS ac/hs and use SSI for elevated BS.              CBG (last 3)  Recent Labs    01/27/22 1137 01/27/22 1616 01/27/22 2057  GLUCAP 150* 147* 172*   Controlled metformin and tradjenta Fair control despite medrol  9. HTN: Monitor BP TID--continue Lisinopril and Lasix             -BP goal <180/105            Vitals:   01/27/22 1913 01/28/22 0413  BP: 130/74 130/73  Pulse: 86 66  Resp: 16 15  Temp: 98.1 F (36.7 C) (!) 97.5 F (36.4 C)  SpO2: 96% 96%  7/5  controlled  10. Non-alcoholic fatty liver disease: Stable. Avoid hepatotoxic medications.             --LFTs WNL. 11. H/o Melanoma/Persistent Mediastinal adenopathy: Per records review--was evaluated by Dr. Delton Coombes with repeat CT chest recommended but no follow up noted in records.  12. Lumbar  stenosis with RLE radiculopathy: ESI by Dr. Ernestina Patches effective in the past.             --now with discomfort due to lack of activity.  --monitor for now. Tramadol prn added.   13. Sleep smart study/OSA: Has been compliant with CPAP use.  14. Low grade fever/Leucocytosis:   Resolving. Monitor for signs of infection. 15. AKI: SCr up likely due to addition of ACE/Better BP control -Encourage oral fluids 16. Left parotid nodule: Follow up with ENT after discharge.            -- 17. 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  --Will start low dose gabapentin. Increase to '100mg'$  TID- pt feels like it was more effective at that dose   18.  Left eye mildly injected with hx of Left sided numness may be irritated from inadvertant contact will add wetting agent  19. Anemia HGB 11  -possibly dilutional?,  follow  LOS: 14 days A FACE TO FACE EVALUATION WAS PERFORMED  Charlett Blake 01/28/2022, 9:47 AM

## 2022-01-28 NOTE — Discharge Summary (Signed)
Physician Discharge Summary  Patient ID: Shelby Morgan MRN: [id number] DOB/AGE: [birth year] 65 y.o.  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 Bump on a table 3 weeks ago. EXAM: LEFT FOOT - 2 VIEW COMPARISON:  Left foot x-rays dated August 18, 2007. FINDINGS: No acute fracture or dislocation. Mild tibiotalar and midfoot osteoarthritis. Large Achilles and plantar calcaneal enthesophytes. Bone mineralization is normal. Mild diffuse soft tissue swelling. IMPRESSION: 1. Mild diffuse soft tissue swelling. No acute osseous abnormality. Electronically Signed   By: Titus Dubin M.D.   On: 01/20/2022 10:15   DG Foot 2 Views Right  Result Date: 01/19/2022 CLINICAL DATA:  [id number] EXAM: RIGHT FOOT - 2 VIEW COMPARISON:  06/03/2016 FINDINGS: Distant ORIF of distal fibular and medial malleolar fractures. Osteoarthritis of the ankle joint with joint space narrowing and marginal osteophytes. Calcaneal spurs are present. There is osteoarthritis of the midfoot. No evidence of regional fracture or focal bone lesion. Degenerative changes have worsened since 2017. 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 No acute finding. Electronically Signed   By: Nelson Chimes M.D.   On: 01/19/2022 13:17   CT ANGIO HEAD NECK W WO  CM  Result Date: 01/08/2022 CLINICAL DATA:  Follow-up examination for acute stroke. EXAM: CT ANGIOGRAPHY HEAD AND NECK TECHNIQUE: Multidetector CT imaging of the head and neck was performed using the standard protocol during bolus administration of intravenous contrast. Multiplanar CT image reconstructions and MIPs were obtained to evaluate the vascular anatomy. Carotid stenosis measurements (when applicable) are obtained utilizing NASCET criteria, using the distal internal carotid diameter as the denominator. RADIATION DOSE REDUCTION: This exam was performed according to the departmental dose-optimization program which includes automated exposure control, adjustment of the mA and/or kV according to patient size and/or use of iterative reconstruction technique. CONTRAST:  51m OMNIPAQUE IOHEXOL 350 MG/ML SOLN COMPARISON:  Prior MRI from 01/06/2022 as well as prior CTA from 04/10/2021 and chest CT from 06/06/2018. FINDINGS: CT HEAD FINDINGS Brain: There has been continued interval evolution of previously identified acute infarct involving the right thalamic capsular region, stable in size as compared to previous MRI. No associated mass effect or evidence for hemorrhagic transformation. No other acute intracranial hemorrhage or large vessel territory infarct. Underlying atrophy with mild chronic small vessel ischemic disease noted. Small remote left thalamic lacunar infarct noted. No mass lesion or midline shift. No hydrocephalus or extra-axial fluid collection. Vascular: No hyperdense vessel. Scattered vascular calcifications noted within the carotid siphons. Skull: Scalp soft tissues and calvarium within normal limits. Sinuses: Paranasal sinuses are largely clear.  No mastoid effusion. Orbits: Globes orbital soft tissues demonstrate no acute finding. Review of the MIP images confirms the above findings CTA NECK FINDINGS Aortic arch: Visualized aortic arch normal in caliber with standard 3 vessel branching pattern.  Mild plaque within the arch itself. No stenosis  about the origin of the great vessels. Right carotid system: Right common and internal carotid arteries patent without stenosis or dissection. Mild for age plaque about the right carotid bulb without stenosis. Left carotid system: Left common and internal carotid arteries patent without stenosis or dissection. Mild for age plaque about the left carotid bulb without significant stenosis. Vertebral arteries: Both vertebral arteries arise from the subclavian arteries. No proximal subclavian artery stenosis. Both vertebral arteries widely patent without stenosis, dissection or occlusion. Skeleton: No discrete or worrisome osseous lesions. Segmental fusion of the C3 and C4 vertebral bodies noted. Moderate spondylosis noted at C5-6 and C6-7. Other neck: No other acute soft tissue abnormality within the neck. 8 mm soft tissue nodule present at the superior aspect left parotid gland, indeterminate. Few additional scattered subcentimeter nodular densities about the bilateral parotid glands favored to reflect intraparotid lymph nodes. 9 mm left thyroid nodule, of doubtful significance given size and patient age, no follow-up imaging recommended (ref: J Am Coll Radiol. 2015 Feb;12(2): 143-50). Upper chest: Visualized upper chest demonstrates no acute finding. Irregular soft tissue density partially visualized at the level of the AP window (series 5, image 161), likely adenopathy as seen on prior studies. This appears somewhat increased in size measuring up to 2.5 cm (previously 1.9 cm on prior chest CT from 06/06/2018. Review of the MIP images confirms the above findings CTA HEAD FINDINGS Anterior circulation: Petrous segments patent bilaterally. Atheromatous change within the carotid siphons without hemodynamically significant stenosis. A1 segments, anterior communicating artery complex common anterior cerebral arteries patent without stenosis. No M1 stenosis or occlusion. No  proximal MCA branch occlusion. Distal left MCA branches widely patent. There is a focal severe proximal right M3 stenosis (series 13, image 76). Right MCA branches otherwise patent and well perfused. Posterior circulation: Both V4 segments patent without significant stenosis. Left PICA patent. Right PICA origin not well seen. Basilar patent to its distal aspect without stenosis superior cerebellar arteries patent proximally. Both PCAs primarily supplied via the basilar. Focal moderate proximal left P2 stenosis (series 12, image 117). Additional severe distal left P3 stenoses (series 12, image 137). Left PCA attenuated but patent distally. On the right, there is a focal severe right P2 stenosis (series 12, image 115) right PCA mildly irregular but otherwise patent to its distal aspect. Venous sinuses: Patent allowing for timing the contrast bolus. Anatomic variants: None significant.  No aneurysm. Review of the MIP images confirms the above findings IMPRESSION: CT HEAD IMPRESSION: 1. Normal expected interval evolution of acute right thalamic infarct, stable in size relative to previous MRI. No evidence for hemorrhagic transformation or other complication. 2. No other new acute intracranial abnormality. 3. Underlying atrophy with chronic small vessel ischemic disease. CTA HEAD AND NECK: 1. Negative CTA for large vessel occlusion. 2. Intracranial atherosclerotic disease with multifocal moderate to severe right M3, bilateral P2, and distal left P3 stenoses as above. No proximal high-grade or correctable stenosis. Overall, appearance is similar as compared to prior CTA from 04/10/2021. 3. Wide patency of the major arterial vasculature within the neck. 4. Mediastinal adenopathy, partially visualized and incompletely assessed on this exam. Further evaluation with dedicated cross-sectional imaging of the chest suggested for complete evaluation. 5. 8 mm nodular lesion within the superior left parotid gland, indeterminate.  While this may reflect an intraparotid lymph node, a possible small primary salivary neoplasm could also have this appearance. Nonemergent outpatient ENT referral for further workup suggested. Electronically Signed   By: Pincus Badder.D.  On: 01/08/2022 05:24    Labs:  Basic Metabolic Panel:    Latest Ref Rng & Units 01/28/2022    4:32 PM 01/23/2022    5:35 AM 01/21/2022    9:45 PM  BMP  Glucose 70 - 99 mg/dL 137  166  133   BUN 8 - 23 mg/dL 38  35  35   Creatinine 0.44 - 1.00 mg/dL 1.58  1.39  1.69   Sodium 135 - 145 mmol/L 133  134  134   Potassium 3.5 - 5.1 mmol/L 4.3  5.1  4.5   Chloride 98 - 111 mmol/L 98  101  96   CO2 22 - 32 mmol/L '26  24  25   '$ Calcium 8.9 - 10.3 mg/dL 9.8  10.3  10.4      CBC:    Latest Ref Rng & Units 01/26/2022    7:46 AM 01/19/2022    6:32 AM 01/15/2022    5:40 AM  CBC  WBC 4.0 - 10.5 K/uL 13.0  8.7  7.8   Hemoglobin 12.0 - 15.0 g/dL 12.6  11.0  12.7   Hematocrit 36.0 - 46.0 % 38.2  34.8  38.3   Platelets 150 - 400 K/uL 556  409  398      CBG: Recent Labs  Lab 01/27/22 2057 01/28/22 1212 01/28/22 1605 01/28/22 2114 01/29/22 0624  GLUCAP 172* 138* 163* 135* 129*    Brief HPI:   Shelby Morgan is a 66 y.o. female with history of HTN, NAFLD, T2DM, morbid obesity 6--BMI 41.9, anxiety disorder, prior CVA was admitted via [facility] on 01/06/2022 with sudden onset of severe numbness left face, arm and leg with weakness.  CT head was negative and TNKase was administered.  She was transferred to [facility] for management.  CTA head/neck was negative for LVO but showed intracranial atherosclerotic disease with moderate to severe right-M3, bilateral P2 and distal left P3 stenosis without change.  Mediastinal adenopathy noted as well as incidental superior left parotid nodule--recommendations for chest imaging as well as ENT follow-up.  MRI brain showed right thalamic capsular infarct with mild to moderate chronic microvascular changes.  Dr. Erlinda Hong felt the  stroke was due to small vessel disease and recommended DAPT x3 weeks followed by Plavix alone.  She was enrolled in sleep Smart study as was positive for OSA and is tolerating CPAP use.  She continues to be limited by mild left hemiparesis with sensory loss, balance deficits, weakness as well as pain in LUE as well as LLE.  Therapy has been working with patient and CIR was recommended due to functional decline   Hospital Course: MALKIA NIPPERT was admitted to rehab 01/14/2022 for inpatient therapies to consist of PT, ST and OT at least three hours five days a week. Past admission physiatrist, therapy team and rehab RN have worked together to provide customized collaborative interdisciplinary care.  She was maintained on DAPT through 07/05 and is to continue Plavix indefinitely.   Follow-up CBC shows H&H and platelets.  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  Gabapentin was added and titrated up to 100 mg 3 times daily which has been effective.  Tramadol was used on as needed basis to manage low back pain due to lumbar stenosis.  Her blood pressures were monitored on TID basis and reasonably controlled.  Serial check of electrolytes showed worsening of renal status with rise in serum creatinine to 1.69.  She was advised to increase  fluid intake and BUN/creatinine has improved.  However due to AKI and nephrology was consulted for input and recommended discontinuation of lisinopril as well as follow-up labs in 1 to 2 weeks as well as referral to nephrology as needed. Patient advised to monitor blood pressures couple times a day at home and appointment with PCP for repeat labs in 1 week.  Right achilles pain was treated with voltaren gel and X rays of foot showed worsening of degenerative changes. Hospital course was significant for development of left great toe redness and pain due to gout flare. X rays of foot was negative for fracture and showed generalized edema.  She was  treated for gout flare with colchicine as well as steroid Dosepak with improvement in her symptoms.  Her diabetes has been monitored with ac/hs CBG checks and SSI was use prn for tighter BS control.  Tradjenta was resumed in place of Januvia as well as metformin at lower dose of 500 mg twice daily.  Her p.o. intake has been good and her blood sugars have improved off of steroids and she was advised to continue monitoring blood sugars on 4 times daily basis and follow-up with PCP for further adjustment in her regimen.  Ego support has been provided by team to help manage anxiety.  She continues to be impaired by sensory deficits LUE with decreased activity tolerance and impaired balance. She has made good gains during her stay and currently requires supervision overall. She will continue to receive follow up outpatient PT and OT at [facility] outpatient rehab after discharge.    Rehab course: During patient's stay in rehab weekly team conferences were held to monitor patient's progress, set goals and discuss barriers to discharge. At admission, patient required  She  has had improvement in activity tolerance, balance, postural control as well as ability to compensate for deficits. She has had improvement in functional use LUE  and LLE as well as improvement in awareness.  She is able to complete UB dressing at modified independent level and requires supervision for LB dressing and bathing.  She is able to perform transfers with supervision and ambulate 150 feet x 2 with rolling walker.  Family education was completed regarding all aspects of safety and care.     Discharge disposition: 01-Home or Self Care  Diet: Heart healthy/Carb Modified  Special Instructions: 1.Do not take lisinopril--needs follow up BMET in 5-7 days. 2. Needs repeat CBC to monitor WBC/H/H 3. Monitor BS ac/hs and BP 2-3 times a day--contact PCP for elevations.  4. Recommend chest CT to evaluate mediastinal adenopathy.    Discharge  Instructions     Ambulatory referral to Neurology   Complete by: As directed    An appointment is requested in approximately: 6 weeks   Ambulatory referral to Nutrition and Diabetic Education   Complete by: As directed    Education on diabetes and weight loss. Dx: Stroke, fatty liver disease and T2DM.   Ambulatory referral to Physical Medicine Rehab   Complete by: As directed    TC appointment with Kirsteins or Yuri      Allergies as of 01/29/2022       Reactions   Codeine Nausea And Vomiting        Medication List     STOP taking these medications    aspirin EC 81 MG tablet   lisinopril 20 MG tablet Commonly known as: ZESTRIL   MAGNESIUM PO   meloxicam 15 MG tablet Commonly known as: MOBIC  naproxen sodium 220 MG tablet Commonly known as: ALEVE   Potassium 99 MG Tabs   sitaGLIPtin 100 MG tablet Commonly known as: JANUVIA       TAKE these medications    acetaminophen 325 MG tablet Commonly known as: TYLENOL Take 650 mg by mouth every 6 (six) hours as needed for moderate pain or mild pain.   busPIRone 5 MG tablet Commonly known as: BUSPAR Take 1 tablet (5 mg total) by mouth 2 (two) times daily.   Centrum Silver 50+Women Tabs Take 1 tablet by mouth daily.   clopidogrel 75 MG tablet Commonly known as: PLAVIX Take 1 tablet (75 mg total) by mouth daily.   diclofenac Sodium 1 % Gel Commonly known as: VOLTAREN Apply 4 g topically 4 (four) times daily. Notes to patient: Right heel tendon and left great toe   furosemide 20 MG tablet Commonly known as: LASIX Take 1 tablet (20 mg total) by mouth every morning.   gabapentin 100 MG capsule Commonly known as: NEURONTIN Take 1 capsule (100 mg total) by mouth 3 (three) times daily.   linagliptin 5 MG Tabs tablet Commonly known as: TRADJENTA Take 1 tablet (5 mg total) by mouth daily.   metFORMIN 500 MG 24 hr tablet Commonly known as: GLUCOPHAGE-XR Take 1 tablet (500 mg total) by mouth 2 (two) times  daily. What changed: how much to take   pantoprazole 40 MG tablet Commonly known as: PROTONIX Take 1 tablet (40 mg total) by mouth daily. What changed: when to take this   polyvinyl alcohol 1.4 % ophthalmic solution Commonly known as: LIQUIFILM TEARS Place 1 drop into the left eye as needed for dry eyes.   rosuvastatin 20 MG tablet Commonly known as: CRESTOR Take 1 tablet (20 mg total) by mouth daily.   traMADol 50 MG tablet Commonly known as: ULTRAM Take 1 tablet (50 mg total) by mouth every 6 (six) hours as needed for moderate pain. What changed: when to take this   traZODone 50 MG tablet Commonly known as: DESYREL Take 1 tablet (50 mg total) by mouth at bedtime as needed for sleep. What changed:  medication strength how much to take        Follow-up Information     Kirsteins, Luanna Salk, MD Follow up.   Specialty: Physical Medicine and Rehabilitation Why: office will call you with follow up appointment Contact information: [place] [address] [phone]         [facility] Follow up.   Why: office will call you with follow up appointment Contact information: [place] [address] [phone]        Melida Quitter, MD Follow up on 02/10/2022.   Specialty: Otolaryngology Why: Be there at !:20 for 1:40 pm appt. Make sure to take insurance card and ID. Contact information: [address] [place] [address] [phone]         Redmond School, MD Follow up on 02/03/2022.   Specialty: Internal Medicine Why: Be there at 11:15 am for post hospital follow up and BMET to check renal function. Minette Brine information: [address] [place] [address] [phone]                 Signed: TISHIE ALTMANN 01/31/2022, 11:48 AM

## 2022-01-28 NOTE — Progress Notes (Signed)
Meds discussed with patient and mother as well as risk factors. She denies any renal issues/CKD and has been on Lisinopril and Lasix for a long time. BUN/SCr have been on upward trend since rehab admission and K 5.1 on last check. Rechecked BMET for follow up which showed SCr back up to 1.58. Discussed meds with pharmacy who questioned Voltaren gel as cause. Also discussed with Dr. Dagoberto Ligas who recommended getting nephrology input. Discussed with Dr. Osborne Casco who recommended d/c lisinopril with labs in 5-7 days and following up with PCP or nephrology for input after discharge.

## 2022-01-28 NOTE — Progress Notes (Signed)
Inpatient Rehabilitation Care Coordinator Discharge Note   Patient Details  Name: Shelby Morgan MRN: [id number] Date of Birth: 09/14/1955   Discharge location: Home  Length of Stay: 15 Days  Discharge activity level: MOD I/ SUP  Home/community participation: mother and other family members  Patient response ME:QASTMH Literacy - How often do you need to have someone help you when you read instructions, pamphlets, or other written material from your doctor or pharmacy?: Never  Patient response DQ:QIWLNL Isolation - How often do you feel lonely or isolated from those around you?: Never  Services provided included: MD, RD, PT, OT, SLP, RN, CM, TR, Pharmacy, SW  Financial Services:  Charity fundraiser Utilized: [employer]  Choices offered to/list presented to: Patient  Follow-up services arranged:  Outpatient    Outpatient Servicies: OP at [facility]      Patient response to transportation need: Is the patient able to respond to transportation needs?: Yes In the past 12 months, has lack of transportation kept you from medical appointments or from getting medications?: No In the past 12 months, has lack of transportation kept you from meetings, work, or from getting things needed for daily living?: No    Comments (or additional information):  Patient/Family verbalized understanding of follow-up arrangements:  Yes  Individual responsible for coordination of the follow-up plan: self  Confirmed correct DME delivered: Dyanne Iha 01/28/2022    Dyanne Iha

## 2022-01-28 NOTE — Progress Notes (Signed)
Occupational Therapy Discharge Summary  Patient Details  Name: Shelby Morgan MRN: [id number] Date of Birth: Feb 28, 1956  Today's Date: 01/28/2022  Patient has met 15 of 15 long term goals due to {due OH:6073710}.  Pt has made excellent progress during LOS with pt able to completed UB dressing MODI, LB dressing with supervision, supervision for ADL transfers with Rw, 3/3 toileting tasks with supervision and grooming tasks MODI. Pt continues to present with impaired sensation in LUE ( more distal vs proximal), decreased activity tolerance, and impaired balance. Pt to DC to her mothers house who can provide supervision assist. Pts mother was present for family ed and verbalized understanding of pt needing 24/7 supervision assist for ADLs and functional mobility. Patient to discharge at overall {LOA:3049010} level.  Patient's care partner {care partner:3041650} to provide the necessary {assistance:3041652} assistance at discharge.    Reasons goals not met:NA  Recommendation:  Patient will benefit from ongoing skilled OT services in {setting:3041680} to continue to advance functional skills in the area of {ADL/iADL:3041649}.  Equipment: TTB, to be be obtained by family   Reasons for discharge: {Reason for discharge:3049018}  Patient/family agrees with progress made and goals achieved: {Pt/Family agree with progress/goals:3049020}  OT Discharge Precautions/Restrictions    General   Vital Signs Therapy Vitals Temp: (!) 97.5 F (36.4 C) Pulse Rate: 66 Resp: 15 BP: 130/73 Patient Position (if appropriate): Lying Oxygen Therapy SpO2: 96 % O2 Device: CPAP Pain Pain Assessment Pain Scale: 0-10 Pain Score: 7  Pain Type: Acute pain;Chronic pain Pain Location: Back Pain Orientation: Lower Pain Radiating Towards: left foot Pain Descriptors / Indicators: Aching;Throbbing;Sore;Sharp Pain Frequency: Constant Pain Onset: On-going Pain Intervention(s): Medication (See  eMAR) ADL ADL Eating: Set up Where Assessed-Eating: Chair Grooming: Modified independent Where Assessed-Grooming: Standing at sink Upper Body Bathing: Supervision/safety Where Assessed-Upper Body Bathing: Shower Lower Body Bathing: Supervision/safety Where Assessed-Lower Body Bathing: Shower Upper Body Dressing: Modified independent (Device) Where Assessed-Upper Body Dressing: Wheelchair Lower Body Dressing: Supervision/safety Where Assessed-Lower Body Dressing: Wheelchair Toileting: Supervision/safety Where Assessed-Toileting: Glass blower/designer: Close supervision Toilet Transfer Method: Ambulating, Other (comment) (RW) Science writer: Ambulance person Transfer: Close supervison Clinical cytogeneticist Method: Ambulating (with Rw) Tub/Shower Equipment: Facilities manager: Moderate assistance Social research officer, government Method: Brewing technologist ADL Comments: supervision overall for ADLs d/t impaired LUE sensation, uses RW Vision Baseline Vision/History: 1 Wears glasses (all the time, bifocals) Patient Visual Report: No change from baseline Perception  Perception: Within Functional Limits Praxis Praxis: Intact Cognition Cognition Overall Cognitive Status: Within Functional Limits for tasks assessed Arousal/Alertness: Awake/alert Memory: Appears intact Attention: Focused;Sustained;Selective Focused Attention: Appears intact Sustained Attention: Appears intact Selective Attention: Appears intact Awareness: Appears intact Problem Solving: Appears intact Reasoning: Appears intact Sequencing: Appears intact Safety/Judgment: Appears intact Brief Interview for Mental Status (BIMS) Repetition of Three Words (First Attempt): 3 Temporal Orientation: Year: Correct Temporal Orientation: Month: Accurate within 5 days Temporal Orientation: Day: Correct Recall: "Sock": Yes, no cue required Recall: "Blue":  Yes, no cue required Recall: "Bed": Yes, no cue required BIMS Summary Score: 15 Sensation Sensation Light Touch: Impaired Detail Central sensation comments: reports some slight residual impaired sensation in palm of R hand and on sole of R foot from prior CVA in 2022; now able to feel deep pressure in L hemibody more so proximally of arm and leg (more near sholder and thigh with less to absent feeling distally) Light Touch Impaired Details: Impaired LUE;Impaired LLE Hot/Cold: Impaired Detail Hot/Cold  Impaired Details: Impaired LLE;Impaired LUE Proprioception: Impaired Detail Proprioception Impaired Details: Impaired LUE;Impaired LLE Stereognosis: Not tested Coordination Gross Motor Movements are Fluid and Coordinated: No Fine Motor Movements are Fluid and Coordinated: No Coordination and Movement Description: mild L hemiparesis but more impaired sensation impacting gross and fine motor movements Motor  Motor Motor: Other (comment) Motor - Discharge Observations: very mild L hemiparesis with more impaired sensation Mobility  Bed Mobility Bed Mobility: Supine to Sit;Sit to Supine Supine to Sit: Supervision/Verbal cueing Sit to Supine: Supervision/Verbal cueing Transfers Sit to Stand: Supervision/Verbal cueing Stand to Sit: Supervision/Verbal cueing  Trunk/Postural Assessment  Cervical Assessment Cervical Assessment: Within Functional Limits Thoracic Assessment Thoracic Assessment: Exceptions to [facility] (rounded shoulders) Lumbar Assessment Lumbar Assessment: Exceptions to [facility] (mild posterior pelvic tilt in sitting) Postural Control Postural Control: Within Functional Limits (using RW support for basic functional mobility tasks)  Balance Balance Balance Assessed: Yes Standardized Balance Assessment Standardized Balance Assessment: Berg Balance Test (Berg from 01/26/22) Merrilee Jansky Balance Test Sit to Stand: Able to stand  independently using hands Standing Unsupported: Able to stand 2  minutes with supervision Sitting with Back Unsupported but Feet Supported on Floor or Stool: Able to sit safely and securely 2 minutes Stand to Sit: Controls descent by using hands Transfers: Able to transfer with verbal cueing and /or supervision Standing Unsupported with Eyes Closed: Able to stand 10 seconds with supervision Standing Ubsupported with Feet Together: Needs help to attain position but able to stand for 30 seconds with feet together From Standing, Reach Forward with Outstretched Arm: Can reach forward >5 cm safely (2") From Standing Position, Pick up Object from Floor: Able to pick up shoe, needs supervision From Standing Position, Turn to Look Behind Over each Shoulder: Looks behind one side only/other side shows less weight shift Turn 360 Degrees: Needs close supervision or verbal cueing Standing Unsupported, Alternately Place Feet on Step/Stool: Able to complete >2 steps/needs minimal assist Standing Unsupported, One Foot in Front: Able to plae foot ahead of the other independently and hold 30 seconds Standing on One Leg: Able to lift leg independently and hold equal to or more than 3 seconds Total Score: 34 Static Sitting Balance Static Sitting - Balance Support: Feet supported Static Sitting - Level of Assistance: 6: Modified independent (Device/Increase time) Dynamic Sitting Balance Dynamic Sitting - Balance Support: Feet supported Dynamic Sitting - Level of Assistance: 6: Modified independent (Device/Increase time) Static Standing Balance Static Standing - Balance Support: During functional activity;Bilateral upper extremity supported Static Standing - Level of Assistance: 5: Stand by assistance;6: Modified independent (Device/Increase time) Dynamic Standing Balance Dynamic Standing - Balance Support: During functional activity;Bilateral upper extremity supported Dynamic Standing - Level of Assistance: 5: Stand by assistance Dynamic Standing - Balance Activities:  Lateral lean/weight shifting;Forward lean/weight shifting Dynamic Standing - Comments: supervision for dynamic balance related to ADls Extremity/Trunk Assessment RUE Assessment RUE Assessment: Within Functional Limits Passive Range of Motion (PROM) Comments: WFL Active Range of Motion (AROM) Comments: WFL General Strength Comments: 4/5 throughout LUE Assessment LUE Assessment: Exceptions to [facility] Passive Range of Motion (PROM) Comments: WFL Active Range of Motion (AROM) Comments: WFL General Strength Comments: 4-/5 mmt LUE Body System: Neuro Brunstrum levels for arm and hand: Arm;Hand Brunstrum level for arm: Stage V Relative Independence from Synergy Brunstrum level for hand: Stage VI Isolated joint movements   [name] 01/28/2022, 7:53 AM

## 2022-01-28 NOTE — Patient Care Conference (Signed)
Inpatient RehabilitationTeam Conference and Plan of Care Update Date: 01/28/2022   Time: 10:50 AM    Patient Name: Shelby Morgan      Medical Record Number: [id number]  Date of Birth: May 24, 1956 Sex: Female         Room/Bed: 4W18C/4W18C-01 Payor Info: Payor: Holland Falling MEDICARE / Plan: Holland Falling MEDICARE HMO/PPO / Product Type: *No Product type* /    Admit Date/Time:  01/14/2022  9:40 PM  Primary Diagnosis:  Right thalamic stroke [facility])  Hospital Problems: Principal Problem:   Right thalamic stroke [facility]) Active Problems:   Primary hypertension   Diabetes mellitus ([place])   AKI (acute kidney injury) [facility])    Expected Discharge Date: Expected Discharge Date: 01/29/22  Team Members Present: Physician leading conference: Dr. Alysia Penna Social Worker Present: Erlene Quan, [place] Nurse Present: Dorien Chihuahua, RN PT Present: Page Spiro, PT OT Present: Willeen Cass, OT;Mary Jabier Gauss, COTA SLP Present: Sherren Kerns, SLP PPS Coordinator present : Gunnar Fusi, SLP     Current Status/Progress Goal Weekly Team Focus  Bowel/Bladder   Continent of bowel and bladder  remain continent  assist as needed and monitor   Swallow/Nutrition/ Hydration             ADL's   supervision for bathing from shower level, supervision for UB dressing, gross supervision for LB dressing, supervision 3/3 toileting tasks, supervision for ambulatory ADL transfers with Rw. continues to present with impaird LUE [facility]  MOD I - supervision  family ed, DC planning   Mobility   supervision supine<>sit, supervision sit<>stand and stand pivot transfers using RW, supervision gait up to 142f using RW, supervision 12 stair navigation using R UE support on handrail  supervision overall at ambulatory level  dynamic standing balance, L hemibody NMR, dynamic gait training using LRAD, stair navigation training, pt education, D/C planning, DME training   Communication             Safety/Cognition/ Behavioral  Observations            Pain   patient reported 0/10 pain,  remain pain free  assess pain q shift and address as needed.   Skin   Patient has scattered bruising, redness on lateral foot has resolved.  No new skin issues  Assess skin q shift and prn     Discharge Planning:  Discharging home on thursday. Fam education on Wednesday. [facility]ME ordered. [facility] with gout pain in great toe; off loading shoe not recommended due to sensory deficits; balance off = unsafe with the platform shoe.  Patient on target to meet rehab goals: yes, currently needs supervision for bathing and dressing and gait. Needs assistance for dynamic gait and turns. Overall goals set for mod I.   *See Care Plan and progress notes for long and short-term goals.   Revisions to Treatment Plan:  N/a  Teaching Needs: Safety, medications, dietary modification recommendations, transfers,etc   Current Barriers to Discharge: Lack of/limited family support  Possible Resolutions to Barriers: Recommend handrails on stairs to entry OP follow up services DME: TTB, RW     Medical Summary Current Status: gout improving , migratory pains include low back at times, BUE adn BLE numbness related to CVAs and diabetic neuropathy  Barriers to Discharge: Medical stability   Possible Resolutions to BCelanese CorporationFocus: Med management for pain , family training   Continued Need for Acute Rehabilitation Level of Care: The patient requires daily medical management by a  physician with specialized training in physical medicine and rehabilitation for the following reasons: Direction of a multidisciplinary physical rehabilitation program to maximize functional independence : Yes Medical management of patient stability for increased activity during participation in an intensive rehabilitation regime.: Yes Analysis of laboratory values and/or radiology reports with any subsequent need for  medication adjustment and/or medical intervention. : Yes   I attest that I was present, lead the team conference, and concur with the assessment and plan of the team.   Dorien Chihuahua B 01/28/2022, 4:06 PM

## 2022-01-29 DIAGNOSIS — F411 Generalized anxiety disorder: Secondary | ICD-10-CM

## 2022-01-29 DIAGNOSIS — M109 Gout, unspecified: Secondary | ICD-10-CM

## 2022-01-29 DIAGNOSIS — E669 Obesity, unspecified: Secondary | ICD-10-CM

## 2022-01-29 DIAGNOSIS — D72829 Elevated white blood cell count, unspecified: Secondary | ICD-10-CM

## 2022-01-29 DIAGNOSIS — K118 Other diseases of salivary glands: Secondary | ICD-10-CM

## 2022-01-29 DIAGNOSIS — G890 Central pain syndrome: Secondary | ICD-10-CM

## 2022-01-29 LAB — GLUCOSE, CAPILLARY: Glucose-Capillary: 129 mg/dL — ABNORMAL HIGH (ref 70–99)

## 2022-01-29 NOTE — Progress Notes (Signed)
PROGRESS NOTE   Subjective/Complaints: Excited to go home  ROS-neg for CP, SOB, N/V/D, chills, fever, HA  Objective:   No results found. No results for input(s): "WBC", "HGB", "HCT", "PLT" in the last 72 hours.   Recent Labs    01/28/22 1632  NA 133*  K 4.3  CL 98  CO2 26  GLUCOSE 137*  BUN 38*  CREATININE 1.58*  CALCIUM 9.8     Intake/Output Summary (Last 24 hours) at 01/29/2022 0800 Last data filed at 01/28/2022 2139 Gross per 24 hour  Intake 354 ml  Output --  Net 354 ml         Physical Exam: Vital Signs Blood pressure 128/63, pulse 69, temperature 97.8 F (36.6 C), resp. rate 17, height '5\' 2"'$  (1.575 m), weight 99 kg, SpO2 98 %.    General: No acute distress, sitting in bed Mood and affect are appropriate Heart: Regular rate and rhythm no rubs murmurs or extra sounds Lungs: Clear to auscultation, breathing unlabored, no rales or wheezes Abdomen: Positive bowel sounds, soft nontender to palpation, nondistended Extremities: No clubbing, cyanosis, or edema Skin: No evidence of breakdown, no evidence of rash, warm and dry   Neuro: CN II-XII intact, motor strength 4/5 in right and 3/5 left deltoid, bicep, tricep, grip. 5/5 RLE, 4/5 LLE Sensory: Normal to LT and propioception in Rt upper, absent propio and reduced LT sensation LUE and reduced LT LLE  . Musculoskeletal: FROM all 4 extremities.  No joint swelling, pain with passive dorsiflexion Right ankle. Tenderness Left 1st MTP joint, mild pain with ROM,- improved Assessment/Plan: 1. Functional deficits which require 3+ hours per day of interdisciplinary therapy in a comprehensive inpatient rehab setting. Physiatrist is providing close team supervision and 24 hour management of active medical problems listed below. Physiatrist and rehab team continue to assess barriers to discharge/monitor patient progress toward functional and medical goals  Care  Tool:  Bathing    Body parts bathed by patient: Right arm, Left arm, Chest, Abdomen, Front perineal area, Buttocks, Right upper leg, Left upper leg, Right lower leg, Face, Left lower leg     Body parts n/a: Left lower leg, Right upper leg, Left upper leg, Right lower leg, Buttocks, Front perineal area   Bathing assist Assist Level: Supervision/Verbal cueing     Upper Body Dressing/Undressing Upper body dressing   What is the patient wearing?: Bra, Pull over shirt    Upper body assist Assist Level: Supervision/Verbal cueing    Lower Body Dressing/Undressing Lower body dressing      What is the patient wearing?: Underwear/pull up, Pants     Lower body assist Assist for lower body dressing: Supervision/Verbal cueing     Toileting Toileting Toileting Activity did not occur (Clothing management and hygiene only): N/A (no void or bm)  Toileting assist Assist for toileting: Supervision/Verbal cueing     Transfers Chair/bed transfer  Transfers assist     Chair/bed transfer assist level: Supervision/Verbal cueing Chair/bed transfer assistive device: Programmer, multimedia   Ambulation assist   Ambulation activity did not occur: Safety/medical concerns  Assist level: Supervision/Verbal cueing Assistive device: Walker-rolling Max distance: 11f   Walk 10  feet activity   Assist     Assist level: Supervision/Verbal cueing Assistive device: Walker-rolling   Walk 50 feet activity   Assist Walk 50 feet with 2 turns activity did not occur: Safety/medical concerns  Assist level: Supervision/Verbal cueing Assistive device: Walker-rolling    Walk 150 feet activity   Assist Walk 150 feet activity did not occur: Safety/medical concerns  Assist level: Supervision/Verbal cueing Assistive device: Walker-rolling    Walk 10 feet on uneven surface  activity   Assist Walk 10 feet on uneven surfaces activity did not occur: Safety/medical  concerns   Assist level: Supervision/Verbal cueing (ramp) Assistive device: Walker-rolling   Wheelchair     Assist Is the patient using a wheelchair?: No Type of Wheelchair: Manual    Wheelchair assist level: Dependent - Patient 0%      Wheelchair 50 feet with 2 turns activity    Assist        Assist Level: Dependent - Patient 0%   Wheelchair 150 feet activity     Assist      Assist Level: Dependent - Patient 0%   Blood pressure 128/63, pulse 69, temperature 97.8 F (36.6 C), resp. rate 17, height '5\' 2"'$  (1.575 m), weight 99 kg, SpO2 98 %.  Medical Problem List and Plan: 1. 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              -patient may shower             -ELOS/Goals: 7/6 team conf in am , discharging to mom's house              -Freeborn today 2.  Antithrombotics: -DVT/anticoagulation:  Pharmaceutical: Lovenox             -antiplatelet therapy: DAPT X 3 weeks followed by plavix alone on 01/27/22 3. Pain Management: tylenol prn.  RIght achilles pain will add voltaren gel and night splint Gout Left 1st MTP- completed medrol dosepack   Left foot xray unremarkable  4. Mood/Sleep: LCSW to follow for evaluation and support.              -antipsychotic agents: N/A 5. Neuropsych/cognition: This patient is capable of making decisions on her own behalf. 6. Skin/Wound Care: Routine pressure relief measures.  7. Fluids/Electrolytes/Nutrition: Monitor I/O. Check CMET in am.  8. T2DM: Hgb A1c- 7.1. Was on Januvia 100/Metform 1000 mg BID PTA.  --Monitor BS ac/hs and use SSI for elevated BS.              CBG (last 3)  Recent Labs    01/28/22 1605 01/28/22 2114 01/29/22 0624  GLUCAP 163* 135* 129*   Controlled metformin and tradjenta Fair control despite medrol  7/6 good control overall 9. HTN: Monitor BP TID--continue Lisinopril and Lasix             -BP goal  <180/105            Vitals:   01/28/22 1955 01/29/22 0453  BP: 116/85 128/63  Pulse: (!) 109 69  Resp: 17 17  Temp: 98.6 F (37 C) 97.8 F (36.6 C)  SpO2: 92% 98%  7/6 BP well controlled, lisinopril was stopped 10. Non-alcoholic fatty liver disease: Stable. Avoid hepatotoxic medications.             --LFTs WNL. 11. H/o Melanoma/Persistent Mediastinal adenopathy: Per records review--was evaluated by Dr. Delton Coombes with repeat CT chest  recommended but no follow up noted in records.  12. Lumbar stenosis with RLE radiculopathy: ESI by Dr. Ernestina Patches effective in the past.             --now with discomfort due to lack of activity.  --monitor for now. Tramadol prn added.   13. Sleep smart study/OSA: Has been compliant with CPAP use.  14. Low grade fever/Leucocytosis:   Resolving. Monitor for signs of infection. 15. AKI: SCr up likely due to addition of ACE/Better BP control -Encourage oral fluids -Voltaren stopped, Cases was discussed by Reesa Chew with Dr. Osborne Casco, lisinopril discontinued, repeat labs in 5-7 days with PCP after discharge 16. Left parotid nodule: Follow up with ENT after discharge.            -- 17. 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  --Will start low dose gabapentin. Increase to '100mg'$  TID- pt feels like it was more effective at that dose   18.  Left eye mildly injected with hx of Left sided numness may be irritated from inadvertant contact will add wetting agent  19. Anemia HGB 11  -possibly dilutional?,  follow  LOS: 15 days A FACE TO FACE EVALUATION WAS PERFORMED  Jennye Boroughs 01/29/2022, 8:00 AM

## 2022-01-29 NOTE — Progress Notes (Signed)
Inpatient Rehabilitation Discharge Medication Review by a Pharmacist  A complete drug regimen review was completed for this patient to identify any potential clinically significant medication issues.  High Risk Drug Classes Is patient taking? Indication by Medication  Antipsychotic No   Anticoagulant No   Antibiotic No   Opioid Yes Tramadol - PRN pain  Antiplatelet Yes Clopidogrel - CVA ppx   Hypoglycemics/insulin Yes Metformin, linagliptin - T2DM  Vasoactive Medication Yes Lasix, lisinopril- hypertension  Chemotherapy No   Other Yes Buspirone - anxiety Diclofenac - topical pain relief Gabapentin - neuropathy Rosuvastatin - HLD Pantoprazole - GERD ppx Trazodone - PRN sleep Gabapentin- neuropathic pain     Type of Medication Issue Identified Description of Issue Recommendation(s)  Drug Interaction(s) (clinically significant)     Duplicate Therapy     Allergy     No Medication Administration End Date     Incorrect Dose     Additional Drug Therapy Needed     Significant med changes from prior encounter (inform family/care partners about these prior to discharge). PTA meds discontinued: Meloxicam, sitagliptin (replaced with linagliptin, simvastatin (replaced with rosuvastatin), lisinopril, naproxen Communicate medication changes with patient/family at discharge  Other       Clinically significant medication issues were identified that warrant physician communication and completion of prescribed/recommended actions by midnight of the next day:  No   Pharmacist comments: n/a   Time spent performing this drug regimen review (minutes): 20   Thank you for allowing pharmacy to be a part of this patient's care.  Ardyth Harps, PharmD Clinical Pharmacist

## 2022-02-03 DIAGNOSIS — I639 Cerebral infarction, unspecified: Secondary | ICD-10-CM | POA: Diagnosis not present

## 2022-02-03 DIAGNOSIS — I10 Essential (primary) hypertension: Secondary | ICD-10-CM | POA: Diagnosis not present

## 2022-02-03 DIAGNOSIS — D518 Other vitamin B12 deficiency anemias: Secondary | ICD-10-CM | POA: Diagnosis not present

## 2022-02-03 DIAGNOSIS — M5030 Other cervical disc degeneration, unspecified cervical region: Secondary | ICD-10-CM | POA: Diagnosis not present

## 2022-02-03 DIAGNOSIS — M4802 Spinal stenosis, cervical region: Secondary | ICD-10-CM | POA: Diagnosis not present

## 2022-02-03 DIAGNOSIS — M47812 Spondylosis without myelopathy or radiculopathy, cervical region: Secondary | ICD-10-CM | POA: Diagnosis not present

## 2022-02-03 DIAGNOSIS — E039 Hypothyroidism, unspecified: Secondary | ICD-10-CM | POA: Diagnosis not present

## 2022-02-03 DIAGNOSIS — Z1331 Encounter for screening for depression: Secondary | ICD-10-CM | POA: Diagnosis not present

## 2022-02-03 DIAGNOSIS — Z6837 Body mass index (BMI) 37.0-37.9, adult: Secondary | ICD-10-CM | POA: Diagnosis not present

## 2022-02-03 DIAGNOSIS — E559 Vitamin D deficiency, unspecified: Secondary | ICD-10-CM | POA: Diagnosis not present

## 2022-02-03 DIAGNOSIS — Z0001 Encounter for general adult medical examination with abnormal findings: Secondary | ICD-10-CM | POA: Diagnosis not present

## 2022-02-05 ENCOUNTER — Other Ambulatory Visit: Payer: Self-pay | Admitting: Physical Medicine and Rehabilitation

## 2022-02-08 ENCOUNTER — Observation Stay (HOSPITAL_COMMUNITY)
Admission: EM | Admit: 2022-02-08 | Discharge: 2022-02-16 | Disposition: A | Discharge status: Home / Self Care | Payer: Medicare HMO | Attending: Family Medicine | Admitting: Family Medicine

## 2022-02-08 ENCOUNTER — Emergency Department (HOSPITAL_COMMUNITY): Payer: Medicare HMO

## 2022-02-08 ENCOUNTER — Encounter (HOSPITAL_COMMUNITY): Payer: Self-pay | Admitting: Emergency Medicine

## 2022-02-08 ENCOUNTER — Other Ambulatory Visit: Payer: Self-pay

## 2022-02-08 ENCOUNTER — Emergency Department (HOSPITAL_BASED_OUTPATIENT_CLINIC_OR_DEPARTMENT_OTHER): Payer: Medicare HMO

## 2022-02-08 DIAGNOSIS — Z8673 Personal history of transient ischemic attack (TIA), and cerebral infarction without residual deficits: Secondary | ICD-10-CM

## 2022-02-08 DIAGNOSIS — I82452 Acute embolism and thrombosis of left peroneal vein: Secondary | ICD-10-CM

## 2022-02-08 DIAGNOSIS — D72829 Elevated white blood cell count, unspecified: Secondary | ICD-10-CM | POA: Insufficient documentation

## 2022-02-08 DIAGNOSIS — E785 Hyperlipidemia, unspecified: Secondary | ICD-10-CM | POA: Diagnosis not present

## 2022-02-08 DIAGNOSIS — N179 Acute kidney failure, unspecified: Secondary | ICD-10-CM | POA: Diagnosis not present

## 2022-02-08 DIAGNOSIS — R52 Pain, unspecified: Secondary | ICD-10-CM

## 2022-02-08 DIAGNOSIS — E1122 Type 2 diabetes mellitus with diabetic chronic kidney disease: Secondary | ICD-10-CM | POA: Insufficient documentation

## 2022-02-08 DIAGNOSIS — Z87891 Personal history of nicotine dependence: Secondary | ICD-10-CM | POA: Diagnosis not present

## 2022-02-08 DIAGNOSIS — Z79899 Other long term (current) drug therapy: Secondary | ICD-10-CM | POA: Diagnosis not present

## 2022-02-08 DIAGNOSIS — R5381 Other malaise: Secondary | ICD-10-CM

## 2022-02-08 DIAGNOSIS — K760 Fatty (change of) liver, not elsewhere classified: Secondary | ICD-10-CM | POA: Diagnosis not present

## 2022-02-08 DIAGNOSIS — M1A9XX0 Chronic gout, unspecified, without tophus (tophi): Secondary | ICD-10-CM | POA: Insufficient documentation

## 2022-02-08 DIAGNOSIS — E876 Hypokalemia: Secondary | ICD-10-CM | POA: Diagnosis not present

## 2022-02-08 DIAGNOSIS — R609 Edema, unspecified: Secondary | ICD-10-CM

## 2022-02-08 DIAGNOSIS — R7401 Elevation of levels of liver transaminase levels: Secondary | ICD-10-CM | POA: Diagnosis present

## 2022-02-08 DIAGNOSIS — E669 Obesity, unspecified: Secondary | ICD-10-CM | POA: Diagnosis not present

## 2022-02-08 DIAGNOSIS — R600 Localized edema: Secondary | ICD-10-CM | POA: Diagnosis not present

## 2022-02-08 DIAGNOSIS — F419 Anxiety disorder, unspecified: Secondary | ICD-10-CM | POA: Diagnosis present

## 2022-02-08 DIAGNOSIS — Z7984 Long term (current) use of oral hypoglycemic drugs: Secondary | ICD-10-CM | POA: Insufficient documentation

## 2022-02-08 DIAGNOSIS — I82402 Acute embolism and thrombosis of unspecified deep veins of left lower extremity: Secondary | ICD-10-CM

## 2022-02-08 DIAGNOSIS — R69 Illness, unspecified: Secondary | ICD-10-CM | POA: Diagnosis not present

## 2022-02-08 DIAGNOSIS — I82409 Acute embolism and thrombosis of unspecified deep veins of unspecified lower extremity: Secondary | ICD-10-CM | POA: Diagnosis present

## 2022-02-08 DIAGNOSIS — R0602 Shortness of breath: Secondary | ICD-10-CM | POA: Diagnosis not present

## 2022-02-08 DIAGNOSIS — R7989 Other specified abnormal findings of blood chemistry: Secondary | ICD-10-CM

## 2022-02-08 DIAGNOSIS — M7989 Other specified soft tissue disorders: Secondary | ICD-10-CM | POA: Diagnosis not present

## 2022-02-08 DIAGNOSIS — I82552 Chronic embolism and thrombosis of left peroneal vein: Secondary | ICD-10-CM | POA: Diagnosis not present

## 2022-02-08 DIAGNOSIS — Z85828 Personal history of other malignant neoplasm of skin: Secondary | ICD-10-CM | POA: Insufficient documentation

## 2022-02-08 DIAGNOSIS — R748 Abnormal levels of other serum enzymes: Secondary | ICD-10-CM | POA: Diagnosis not present

## 2022-02-08 DIAGNOSIS — Z7902 Long term (current) use of antithrombotics/antiplatelets: Secondary | ICD-10-CM | POA: Diagnosis not present

## 2022-02-08 DIAGNOSIS — I129 Hypertensive chronic kidney disease with stage 1 through stage 4 chronic kidney disease, or unspecified chronic kidney disease: Secondary | ICD-10-CM | POA: Insufficient documentation

## 2022-02-08 DIAGNOSIS — M79673 Pain in unspecified foot: Secondary | ICD-10-CM | POA: Diagnosis not present

## 2022-02-08 DIAGNOSIS — K219 Gastro-esophageal reflux disease without esophagitis: Secondary | ICD-10-CM | POA: Diagnosis not present

## 2022-02-08 DIAGNOSIS — Z723 Lack of physical exercise: Secondary | ICD-10-CM | POA: Insufficient documentation

## 2022-02-08 DIAGNOSIS — E1169 Type 2 diabetes mellitus with other specified complication: Secondary | ICD-10-CM | POA: Diagnosis not present

## 2022-02-08 DIAGNOSIS — R000 Tachycardia, unspecified: Secondary | ICD-10-CM | POA: Diagnosis not present

## 2022-02-08 DIAGNOSIS — I10 Essential (primary) hypertension: Secondary | ICD-10-CM | POA: Diagnosis present

## 2022-02-08 DIAGNOSIS — Z23 Encounter for immunization: Secondary | ICD-10-CM | POA: Diagnosis not present

## 2022-02-08 DIAGNOSIS — N182 Chronic kidney disease, stage 2 (mild): Secondary | ICD-10-CM | POA: Diagnosis not present

## 2022-02-08 DIAGNOSIS — B029 Zoster without complications: Secondary | ICD-10-CM | POA: Insufficient documentation

## 2022-02-08 DIAGNOSIS — R079 Chest pain, unspecified: Secondary | ICD-10-CM | POA: Diagnosis not present

## 2022-02-08 LAB — CBC WITH DIFFERENTIAL/PLATELET
Abs Immature Granulocytes: 0.03 10*3/uL (ref 0.00–0.07)
Basophils Absolute: 0.1 10*3/uL (ref 0.0–0.1)
Basophils Relative: 1 %
Eosinophils Absolute: 0.4 10*3/uL (ref 0.0–0.5)
Eosinophils Relative: 4 %
HCT: 36.5 % (ref 36.0–46.0)
Hemoglobin: 11.9 g/dL — ABNORMAL LOW (ref 12.0–15.0)
Immature Granulocytes: 0 %
Lymphocytes Relative: 16 %
Lymphs Abs: 1.7 10*3/uL (ref 0.7–4.0)
MCH: 27.4 pg (ref 26.0–34.0)
MCHC: 32.6 g/dL (ref 30.0–36.0)
MCV: 84.1 fL (ref 80.0–100.0)
Monocytes Absolute: 1.2 10*3/uL — ABNORMAL HIGH (ref 0.1–1.0)
Monocytes Relative: 11 %
Neutro Abs: 7.5 10*3/uL (ref 1.7–7.7)
Neutrophils Relative %: 68 %
Platelets: 337 10*3/uL (ref 150–400)
RBC: 4.34 MIL/uL (ref 3.87–5.11)
RDW: 13.5 % (ref 11.5–15.5)
WBC: 11 10*3/uL — ABNORMAL HIGH (ref 4.0–10.5)
nRBC: 0 % (ref 0.0–0.2)

## 2022-02-08 LAB — COMPREHENSIVE METABOLIC PANEL
ALT: 19 U/L (ref 0–44)
AST: 16 U/L (ref 15–41)
Albumin: 3.6 g/dL (ref 3.5–5.0)
Alkaline Phosphatase: 51 U/L (ref 38–126)
Anion gap: 13 (ref 5–15)
BUN: 13 mg/dL (ref 8–23)
CO2: 29 mmol/L (ref 22–32)
Calcium: 9.4 mg/dL (ref 8.9–10.3)
Chloride: 95 mmol/L — ABNORMAL LOW (ref 98–111)
Creatinine, Ser: 1.36 mg/dL — ABNORMAL HIGH (ref 0.44–1.00)
GFR, Estimated: 43 mL/min — ABNORMAL LOW (ref >60)
Glucose, Bld: 105 mg/dL — ABNORMAL HIGH (ref 70–99)
Potassium: 3.4 mmol/L — ABNORMAL LOW (ref 3.5–5.1)
Sodium: 137 mmol/L (ref 135–145)
Total Bilirubin: 0.9 mg/dL (ref 0.3–1.2)
Total Protein: 8.2 g/dL — ABNORMAL HIGH (ref 6.5–8.1)

## 2022-02-08 LAB — BRAIN NATRIURETIC PEPTIDE: B Natriuretic Peptide: 6.6 pg/mL (ref 0.0–100.0)

## 2022-02-08 LAB — TROPONIN I (HIGH SENSITIVITY)
Troponin I (High Sensitivity): 4 ng/L (ref <18)
Troponin I (High Sensitivity): 4 ng/L (ref <18)

## 2022-02-08 LAB — CBG MONITORING, ED: Glucose-Capillary: 91 mg/dL (ref 70–99)

## 2022-02-08 MED ORDER — INSULIN ASPART 100 UNIT/ML IJ SOLN
0.0000 [IU] | Freq: Three times a day (TID) | INTRAMUSCULAR | Status: DC
  Administered 2022-02-09: 5 [IU] via SUBCUTANEOUS
  Administered 2022-02-09: 3 [IU] via SUBCUTANEOUS
  Administered 2022-02-09: 11 [IU] via SUBCUTANEOUS
  Administered 2022-02-10: 2 [IU] via SUBCUTANEOUS
  Administered 2022-02-10: 8 [IU] via SUBCUTANEOUS
  Administered 2022-02-10: 5 [IU] via SUBCUTANEOUS
  Administered 2022-02-11: 2 [IU] via SUBCUTANEOUS
  Administered 2022-02-11: 8 [IU] via SUBCUTANEOUS
  Administered 2022-02-11: 3 [IU] via SUBCUTANEOUS
  Administered 2022-02-12: 2 [IU] via SUBCUTANEOUS
  Administered 2022-02-12: 3 [IU] via SUBCUTANEOUS
  Administered 2022-02-12: 11 [IU] via SUBCUTANEOUS
  Administered 2022-02-13: 5 [IU] via SUBCUTANEOUS
  Administered 2022-02-13 – 2022-02-14 (×3): 3 [IU] via SUBCUTANEOUS
  Administered 2022-02-14 (×2): 2 [IU] via SUBCUTANEOUS
  Administered 2022-02-15: 3 [IU] via SUBCUTANEOUS
  Administered 2022-02-15: 2 [IU] via SUBCUTANEOUS
  Administered 2022-02-16: 3 [IU] via SUBCUTANEOUS
  Administered 2022-02-16: 2 [IU] via SUBCUTANEOUS
  Administered 2022-02-16: 3 [IU] via SUBCUTANEOUS

## 2022-02-08 MED ORDER — PANTOPRAZOLE SODIUM 40 MG PO TBEC
40.0000 mg | DELAYED_RELEASE_TABLET | Freq: Every day | ORAL | Status: DC
  Administered 2022-02-09 – 2022-02-16 (×8): 40 mg via ORAL
  Filled 2022-02-08 (×8): qty 1

## 2022-02-08 MED ORDER — TRAMADOL HCL 50 MG PO TABS
50.0000 mg | ORAL_TABLET | Freq: Four times a day (QID) | ORAL | Status: DC | PRN
  Administered 2022-02-14 – 2022-02-16 (×3): 50 mg via ORAL
  Filled 2022-02-08 (×3): qty 1

## 2022-02-08 MED ORDER — PREDNISONE 20 MG PO TABS
40.0000 mg | ORAL_TABLET | Freq: Every day | ORAL | Status: AC
Start: 2022-02-08 — End: 2022-02-13
  Administered 2022-02-09 – 2022-02-13 (×5): 40 mg via ORAL
  Filled 2022-02-08 (×5): qty 2

## 2022-02-08 MED ORDER — ROSUVASTATIN CALCIUM 20 MG PO TABS
20.0000 mg | ORAL_TABLET | Freq: Every day | ORAL | Status: DC
  Administered 2022-02-09 – 2022-02-16 (×9): 20 mg via ORAL
  Filled 2022-02-08 (×10): qty 1

## 2022-02-08 MED ORDER — POLYETHYLENE GLYCOL 3350 17 G PO PACK: 17.0000 g | PACK | Freq: Every day | ORAL | Status: DC | PRN

## 2022-02-08 MED ORDER — SODIUM CHLORIDE 0.9 % IV SOLN
INTRAVENOUS | Status: DC
Start: 2022-02-08 — End: 2022-02-09

## 2022-02-08 MED ORDER — POTASSIUM CHLORIDE 20 MEQ PO PACK
40.0000 meq | PACK | Freq: Once | ORAL | Status: AC
  Administered 2022-02-08: 40 meq via ORAL
  Filled 2022-02-08: qty 2

## 2022-02-08 MED ORDER — FENTANYL CITRATE PF 50 MCG/ML IJ SOSY
50.0000 ug | PREFILLED_SYRINGE | Freq: Once | INTRAMUSCULAR | Status: AC
  Administered 2022-02-08: 50 ug via INTRAVENOUS
  Filled 2022-02-08: qty 1

## 2022-02-08 MED ORDER — POLYVINYL ALCOHOL 1.4 % OP SOLN
1.0000 [drp] | OPHTHALMIC | Status: DC | PRN
Start: 2022-02-08 — End: 2022-02-17

## 2022-02-08 MED ORDER — BUSPIRONE HCL 5 MG PO TABS
5.0000 mg | ORAL_TABLET | Freq: Two times a day (BID) | ORAL | Status: DC
  Administered 2022-02-08 – 2022-02-16 (×16): 5 mg via ORAL
  Filled 2022-02-08 (×16): qty 1

## 2022-02-08 MED ORDER — ACETAMINOPHEN 325 MG PO TABS
650.0000 mg | ORAL_TABLET | Freq: Four times a day (QID) | ORAL | Status: DC | PRN
  Administered 2022-02-14 – 2022-02-16 (×2): 650 mg via ORAL
  Filled 2022-02-08 (×2): qty 2

## 2022-02-08 MED ORDER — APIXABAN 5 MG PO TABS
5.0000 mg | ORAL_TABLET | Freq: Two times a day (BID) | ORAL | Status: DC
  Administered 2022-02-15 – 2022-02-16 (×2): 5 mg via ORAL
  Filled 2022-02-08 (×3): qty 1

## 2022-02-08 MED ORDER — ACETAMINOPHEN 650 MG RE SUPP: 650.0000 mg | Freq: Four times a day (QID) | RECTAL | Status: DC | PRN

## 2022-02-08 MED ORDER — IOHEXOL 350 MG/ML SOLN
65.0000 mL | Freq: Once | INTRAVENOUS | Status: AC | PRN
  Administered 2022-02-08: 65 mL via INTRAVENOUS

## 2022-02-08 MED ORDER — TRAZODONE HCL 50 MG PO TABS
50.0000 mg | ORAL_TABLET | Freq: Every evening | ORAL | Status: DC | PRN
  Administered 2022-02-14 – 2022-02-15 (×2): 50 mg via ORAL
  Filled 2022-02-08 (×2): qty 1

## 2022-02-08 MED ORDER — SODIUM CHLORIDE 0.9% FLUSH
3.0000 mL | Freq: Two times a day (BID) | INTRAVENOUS | Status: DC
Start: 2022-02-08 — End: 2022-02-17
  Administered 2022-02-08 – 2022-02-15 (×14): 3 mL via INTRAVENOUS

## 2022-02-08 MED ORDER — CLOPIDOGREL BISULFATE 75 MG PO TABS
75.0000 mg | ORAL_TABLET | Freq: Every day | ORAL | Status: DC
  Administered 2022-02-09: 75 mg via ORAL
  Filled 2022-02-08: qty 1

## 2022-02-08 MED ORDER — APIXABAN 5 MG PO TABS
10.0000 mg | ORAL_TABLET | Freq: Two times a day (BID) | ORAL | Status: AC
  Administered 2022-02-08 – 2022-02-15 (×14): 10 mg via ORAL
  Filled 2022-02-08 (×14): qty 2

## 2022-02-08 MED ORDER — FUROSEMIDE 20 MG PO TABS: 20.0000 mg | ORAL_TABLET | Freq: Every morning | ORAL | Status: DC

## 2022-02-08 MED ORDER — GABAPENTIN 100 MG PO CAPS
100.0000 mg | ORAL_CAPSULE | Freq: Three times a day (TID) | ORAL | Status: DC
Start: 2022-02-08 — End: 2022-02-17
  Administered 2022-02-08 – 2022-02-16 (×24): 100 mg via ORAL
  Filled 2022-02-08 (×24): qty 1

## 2022-02-08 NOTE — Progress Notes (Signed)
VASCULAR LAB    Bilateral lower extremity venous duplex has been performed.  See CV proc for preliminary results.  Gave verbal report to Dr. Emelda Fear, [facility], RVT 02/08/2022, 6:46 PM

## 2022-02-08 NOTE — ED Provider Notes (Signed)
[facility] EMERGENCY DEPARTMENT Provider Note   CSN: [id number] Arrival date & time: 02/08/22  1045     History  Chief Complaint  Patient presents with   Foot Swelling    Shelby Morgan is a 66 y.o. female.  The history is provided by the patient and medical records. No language interpreter was used.  Shortness of Breath Severity:  Severe Onset quality:  Gradual Duration:  2 days Timing:  Constant Progression:  Waxing and waning Chronicity:  New Relieved by:  Nothing Worsened by:  Exertion Ineffective treatments:  None tried Associated symptoms: no abdominal pain, no chest pain, no cough, no fever, no headaches, no neck pain, no rash, no sputum production, no vomiting and no wheezing   Risk factors: no hx of PE/DVT        Home Medications Prior to Admission medications   Medication Sig Start Date End Date Taking? Authorizing Provider  acetaminophen (TYLENOL) 325 MG tablet Take 650 mg by mouth every 6 (six) hours as needed for moderate pain or mild pain.    [provider]  busPIRone (BUSPAR) 5 MG tablet Take 1 tablet (5 mg total) by mouth 2 (two) times daily. 01/28/22   Love, Ivan Anchors, PA-C  clopidogrel (PLAVIX) 75 MG tablet Take 1 tablet (75 mg total) by mouth daily. 01/28/22   Love, Ivan Anchors, PA-C  diclofenac Sodium (VOLTAREN) 1 % GEL Apply 4 g topically 4 (four) times daily. 01/28/22   Love, Ivan Anchors, PA-C  furosemide (LASIX) 20 MG tablet Take 1 tablet (20 mg total) by mouth every morning. 01/28/22   Love, Ivan Anchors, PA-C  gabapentin (NEURONTIN) 100 MG capsule Take 1 capsule (100 mg total) by mouth 3 (three) times daily. 01/28/22   Love, Ivan Anchors, PA-C  linagliptin (TRADJENTA) 5 MG TABS tablet Take 1 tablet (5 mg total) by mouth daily. 01/29/22   Love, Ivan Anchors, PA-C  metFORMIN (GLUCOPHAGE-XR) 500 MG 24 hr tablet Take 1 tablet (500 mg total) by mouth 2 (two) times daily. 01/28/22   Love, Ivan Anchors, PA-C  Multiple Vitamins-Minerals (CENTRUM SILVER 50+WOMEN)  TABS Take 1 tablet by mouth daily.    [provider]  pantoprazole (PROTONIX) 40 MG tablet Take 1 tablet (40 mg total) by mouth daily. 01/28/22   Love, Ivan Anchors, PA-C  polyvinyl alcohol (LIQUIFILM TEARS) 1.4 % ophthalmic solution Place 1 drop into the left eye as needed for dry eyes. 01/28/22   Love, Ivan Anchors, PA-C  rosuvastatin (CRESTOR) 20 MG tablet Take 1 tablet (20 mg total) by mouth daily. 01/28/22   Love, Ivan Anchors, PA-C  traMADol (ULTRAM) 50 MG tablet Take 1 tablet (50 mg total) by mouth every 6 (six) hours as needed for moderate pain. 01/28/22   Love, Ivan Anchors, PA-C  traZODone (DESYREL) 50 MG tablet Take 1 tablet (50 mg total) by mouth at bedtime as needed for sleep. 01/28/22   Love, Ivan Anchors, PA-C      Allergies    Codeine    Review of Systems   Review of Systems  Constitutional:  Positive for fatigue. Negative for chills and fever.  HENT:  Negative for congestion.   Eyes:  Negative for visual disturbance.  Respiratory:  Positive for shortness of breath. Negative for cough, sputum production, chest tightness and wheezing.   Cardiovascular:  Positive for leg swelling. Negative for chest pain and palpitations.  Gastrointestinal:  Negative for abdominal pain, constipation, diarrhea, nausea and vomiting.  Genitourinary:  Negative for dysuria,  flank pain and frequency.  Musculoskeletal:  Negative for back pain, neck pain and neck stiffness.  Skin:  Positive for color change. Negative for rash and wound.  Neurological:  Negative for weakness, light-headedness and headaches.  Psychiatric/Behavioral:  Negative for agitation and confusion.   All other systems reviewed and are negative.   Physical Exam Updated Vital Signs BP 121/84   Pulse (!) 103   Temp 98.1 F (36.7 C) (Oral)   Resp 16   SpO2 95%  Physical Exam Vitals and nursing note reviewed.  Constitutional:      General: She is not in acute distress.    Appearance: She is well-developed. She is not ill-appearing,  toxic-appearing or diaphoretic.  HENT:     Head: Normocephalic and atraumatic.     Nose: No congestion or rhinorrhea.     Mouth/Throat:     Mouth: Mucous membranes are moist.     Pharynx: No oropharyngeal exudate or posterior oropharyngeal erythema.  Eyes:     Conjunctiva/sclera: Conjunctivae normal.     Pupils: Pupils are equal, round, and reactive to light.  Cardiovascular:     Rate and Rhythm: Normal rate and regular rhythm.     Heart sounds: No murmur heard. Pulmonary:     Effort: No respiratory distress.     Breath sounds: Normal breath sounds. No wheezing, rhonchi or rales.  Chest:     Chest wall: No tenderness.  Abdominal:     General: Abdomen is flat.     Palpations: Abdomen is soft.     Tenderness: There is no abdominal tenderness. There is no right CVA tenderness, left CVA tenderness, guarding or rebound.  Musculoskeletal:        General: No swelling or tenderness.     Cervical back: Neck supple. No tenderness.     Right lower leg: Edema present.     Left lower leg: Edema present.     Comments: Patient has edema in both legs and some erythema on the dorsums of her feet.  Tenderness more on the left side than the right.  No tenderness on the right ankle on my exam.  No hip tenderness.  Chest nontender.  Abdomen nontender  Skin:    General: Skin is warm and dry.     Capillary Refill: Capillary refill takes less than 2 seconds.     Findings: Erythema present.  Neurological:     Mental Status: She is alert.     Sensory: Sensory deficit present.     Motor: Weakness present.     Comments: Numbness in left arm and left leg compared to right.  Mild weakness in left arm compared to right arm.  Patient reports it is unchanged from recent stroke.   Psychiatric:        Mood and Affect: Mood normal.     ED Results / Procedures / Treatments   Labs (all labs ordered are listed, but only abnormal results are displayed) Labs Reviewed  CBC WITH DIFFERENTIAL/PLATELET - Abnormal;  Notable for the following components:      Result Value   WBC 11.0 (*)    Hemoglobin 11.9 (*)    Monocytes Absolute 1.2 (*)    All other components within normal limits  COMPREHENSIVE METABOLIC PANEL - Abnormal; Notable for the following components:   Potassium 3.4 (*)    Chloride 95 (*)    Glucose, Bld 105 (*)    Creatinine, Ser 1.36 (*)    Total Protein 8.2 (*)  GFR, Estimated 43 (*)    All other components within normal limits  BRAIN NATRIURETIC PEPTIDE  TROPONIN I (HIGH SENSITIVITY)  TROPONIN I (HIGH SENSITIVITY)    EKG EKG Interpretation  Date/Time:  Sunday February 08 2022 10:56:10 EDT Ventricular Rate:  110 PR Interval:  134 QRS Duration: 74 QT Interval:  300 QTC Calculation: 406 R Axis:   -13 Text Interpretation: Sinus tachycardia Minimal voltage criteria for LVH, may be normal variant ( R in aVL ) Cannot rule out Anterior infarct , age undetermined Abnormal ECG When compared with ECG of 06-Jan-2022 20:16, PREVIOUS ECG IS PRESENT when compared to prior,  more artifact. No STEMI Confirmed by Antony Blackbird [phone]) on 02/08/2022 4:42:16 PM  Radiology CT Angio Chest PE W and/or Wo Contrast  Result Date: 02/08/2022 CLINICAL DATA:  Tachycardia, shortness of breath, chest pain EXAM: CT ANGIOGRAPHY CHEST WITH CONTRAST TECHNIQUE: Multidetector CT imaging of the chest was performed using the standard protocol during bolus administration of intravenous contrast. Multiplanar CT image reconstructions and MIPs were obtained to evaluate the vascular anatomy. RADIATION DOSE REDUCTION: This exam was performed according to the departmental dose-optimization program which includes automated exposure control, adjustment of the mA and/or kV according to patient size and/or use of iterative reconstruction technique. CONTRAST:  28m OMNIPAQUE IOHEXOL 350 MG/ML SOLN COMPARISON:  06/06/2018 FINDINGS: Cardiovascular: There is homogeneous enhancement in thoracic aorta. There are no intraluminal filling  defect in central pulmonary artery branches. Evaluation of small peripheral branches is limited by motion artifact. Scattered coronary artery calcifications are seen. Mediastinum/Nodes: There are enlarged lymph nodes in mediastinum and both hilar regions with no significant interval change. Lungs/Pleura: Breathing motion limits evaluation of lung fields. As far as seen, there is no focal pulmonary consolidation. There is narrowing of AP diameter of the trachea and main bronchi, possibly suggesting bronchospasm. There is no pleural effusion or pneumothorax. Upper Abdomen: There is fatty infiltration in liver. There is minimal nodularity of liver surface. There is possible hyperplasia in left adrenal. Musculoskeletal: Degenerative changes are noted in thoracic spine with bony spurs. Review of the MIP images confirms the above findings. IMPRESSION: There is no evidence of central pulmonary artery embolism. Evaluation of small peripheral pulmonary artery branches is limited by motion artifact. There is no evidence of thoracic aortic dissection. There is no focal pulmonary consolidation. Coronary artery calcifications are seen. Fatty liver. There is minimal nodularity of the liver surface suggesting possible cirrhosis. Electronically Signed   By: PElmer PickerM.D.   On: 02/08/2022 19:29   VAS [place]OWER EXTREMITY VENOUS (DVT) (ONLY [facility] & [facility])  Result Date: 02/08/2022  Lower Venous DVT Study Patient Name:  PRETAJ Morgan Date of Exam:   02/08/2022 Medical Rec #: [id number]      Accession #:    [id number] of Birth: [birth year]      Patient Gender: F Patient Age:   89years [facility]      VAS [place]OWER EXTREMITY VENOUS (DVT) Referring Phys: CMarda Stalker--------------------------------------------------------------------------------  Indications: Bilateral lower extremity pitting edema and foot pain.  Risk Factors: Immobility (patient was in in patient rehab until last  week) Recent stroke affecting left side. Limitations: Edema, patient movement. Comparison Study: Prior negative right LEV done 05/02/12 Performing Technologist: CSharion DoveRVS  Examination Guidelines: A complete evaluation includes B-mode imaging, spectral Doppler, color Doppler, and power Doppler as needed of all accessible portions of each vessel. Bilateral testing is considered an  integral part of a complete examination. Limited examinations for reoccurring indications may be performed as noted. The reflux portion of the exam is performed with the patient in reverse Trendelenburg.  +---------+---------------+---------+-----------+----------+--------------+ RIGHT    CompressibilityPhasicitySpontaneityPropertiesThrombus Aging +---------+---------------+---------+-----------+----------+--------------+ CFV      Full           Yes      Yes                                 +---------+---------------+---------+-----------+----------+--------------+ SFJ      Full                                                        +---------+---------------+---------+-----------+----------+--------------+ FV Prox  Full                                                        +---------+---------------+---------+-----------+----------+--------------+ FV Mid   Full                                                        +---------+---------------+---------+-----------+----------+--------------+ FV DistalFull                                                        +---------+---------------+---------+-----------+----------+--------------+ PFV      Full                                                        +---------+---------------+---------+-----------+----------+--------------+ POP      Full           Yes      Yes                                 +---------+---------------+---------+-----------+----------+--------------+ PTV      Full                                                         +---------+---------------+---------+-----------+----------+--------------+ PERO     Full                                                        +---------+---------------+---------+-----------+----------+--------------+   +---------+---------------+---------+-----------+----------+--------------+ LEFT     CompressibilityPhasicitySpontaneityPropertiesThrombus Aging +---------+---------------+---------+-----------+----------+--------------+ CFV  Full           Yes      Yes                                 +---------+---------------+---------+-----------+----------+--------------+ SFJ      Full                                                        +---------+---------------+---------+-----------+----------+--------------+ FV Prox  Full                                                        +---------+---------------+---------+-----------+----------+--------------+ FV Mid   Full                                                        +---------+---------------+---------+-----------+----------+--------------+ FV DistalFull                                                        +---------+---------------+---------+-----------+----------+--------------+ PFV      Full                                                        +---------+---------------+---------+-----------+----------+--------------+ POP      Full           Yes      Yes                                 +---------+---------------+---------+-----------+----------+--------------+ PTV      Full                                                        +---------+---------------+---------+-----------+----------+--------------+ PERO     Partial        No       No                   Chronic        +---------+---------------+---------+-----------+----------+--------------+ Small segment of probable chronic DVT noted in one of the paired peroneal veins mid calf.    Summary:  BILATERAL: -No evidence of popliteal cyst, bilaterally. RIGHT: - There is no evidence of deep vein thrombosis in the lower extremity.  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 - Small segment of probable chronic DVT noted in one of the paired peroneal veins mid calf.  *See  table(s) above for measurements and observations. Electronically signed by Harold Barban MD on 02/08/2022 at 7:16:56 PM.    Final    DG Foot Complete Left  Result Date: 02/08/2022 CLINICAL DATA:  Foot pain, redness and swelling. EXAM: LEFT FOOT - COMPLETE 3+ VIEW COMPARISON:  01/20/2022 FINDINGS: No acute fracture. Stable alignment. Moderate midfoot dorsal spurring. Mild tibial talar spurring. Large plantar calcaneal spur and Achilles tendon enthesophyte. No erosion or bone destruction. Generalized soft tissue edema similar to prior exam. Prominent vascular calcifications. No soft tissue gas or radiopaque foreign body. IMPRESSION: 1. Generalized soft tissue edema similar to prior exam. No soft tissue gas or radiopaque foreign body. 2. No acute osseous findings. Midfoot and ankle joint osteoarthritis. 3. Plantar calcaneal spur and Achilles tendon enthesophyte. Electronically Signed   By: Keith Rake M.D.   On: 02/08/2022 17:45   DG Foot Complete Right  Result Date: 02/08/2022 CLINICAL DATA:  Foot pain, redness and swelling. EXAM: RIGHT FOOT COMPLETE - 3+ VIEW COMPARISON:  Radiograph 01/19/2022 FINDINGS: Medial malleolar and distal fibular hardware partially visualized. Possible lucency about the medial malleolar screw, seen only on the oblique view. Degenerative spurring of the midfoot. Tibial talar osteoarthritis. Findings are similar to prior exam. Unchanged plantar calcaneal spur and Achilles tendon enthesophyte. No fracture. No erosive change or bony destruction. Generalized soft tissue edema, similar. No soft tissue gas or radiopaque foreign body. IMPRESSION: 1. Generalized  soft tissue edema, similar to prior. No soft tissue gas or radiopaque foreign body. 2. 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 No acute osseous findings. 3. Surgical hardware in the distal tibia and fibula. Possible but not definite lucency about medial malleolar screw which may represent loosening or infection. Electronically Signed   By: Keith Rake M.D.   On: 02/08/2022 17:43   DG Chest 2 View  Result Date: 02/08/2022 CLINICAL DATA:  Shortness of breath EXAM: CHEST - 2 VIEW COMPARISON:  06/06/2018 FINDINGS: The heart size and mediastinal contours are within normal limits. No focal airspace consolidation, pleural effusion, or pneumothorax. The visualized skeletal structures are unremarkable. IMPRESSION: No active cardiopulmonary disease. Electronically Signed   By: Davina Poke D.O.   On: 02/08/2022 11:49    Procedures Procedures    Medications Ordered in ED Medications  fentaNYL (SUBLIMAZE) injection 50 mcg (50 mcg Intravenous Given 02/08/22 1739)  iohexol (OMNIPAQUE) 350 MG/ML injection 65 mL (65 mLs Intravenous Contrast Given 02/08/22 1911)    ED Course/ Medical Decision Making/ A&P                           Medical Decision Making Amount and/or Complexity of Data Reviewed Radiology: ordered.  Risk Prescription drug management. Decision regarding hospitalization.    KATILIN RAYNES is a 66 y.o. female with a past medical history significant for nonalcoholic fatty liver disease, hypertension, diverticulosis, anxiety, diabetes, and recent admission for acute stroke with left-sided deficits as well as recent gout who presents with several days of worsening exertional shortness of breath, fatigue, bilateral leg pain and swelling.  According to patient, she was discharged from the hospital and was doing fairly well with her stroke where she had left-sided weakness and numbness.  She reports the weakness has improved but she only has some slight numbness now.  She  reports that during her admission they thought she likely had gout in her feet and she was on the steroids for that.  She has since completed them but  over the last few days the edema has started in both legs which is new for her.  She says that she does take fluid pills for edema but this is worse.  She reports the redness and pain has developed in both feet and hurts to walk.  She reports that when she tries to walk aside from the leg symptoms she is having exertional shortness of breath and has to stop to catch her breath.  She is tachycardic on arrival to the emergency department.  She is denying any chest pain however or back pain or abdominal pain.  Denies new trauma.  She denies any change with her neurologic deficits from the stroke.  Denies any constipation, diarrhea, or urinary changes.  On exam, patient does have edema in both ankles and feet as well as the lower calves.  There is some erythema seen on her feet which she reports is new and slightly different than when she had the gout.  She had clear breath sounds and nontender chest or abdomen.  She did have some subtle numbness in the left arm and leg compared to the right and had some subtle weakness in the left arm compared to the right.  Patient otherwise well-appearing.  Given the patient's recent admission with some immobility due to stroke and her new bilateral leg swelling and new tachycardia and shortness of breath, I do feel need to rule out pulmonary emboli as source of symptoms.  We will get ultrasound of the legs and get a CT PE study.  Recent imaging during her admission did show some abnormalities on her chest and a CT was recommended but has not yet been completed.  We will get x-rays of her feet to make sure there is not some evidence of acute infection.  Patient did have some blistering and cracking on her legs which could be a source for cellulitis if the ultrasounds were negative.  It is not just a podagra type picture for the  pain and she is more tender on the dorsum of her feet bilaterally not overlying joints.  Gout was felt to be less likely this time and more concern about cellulitis if ultrasounds do not show evidence of clot.  We will ambulate with pulse oximetry give her some pain medicine she can do so with her feet.  She had some screening in triage that revealed a reassuring chest x-ray, troponin negative initially, BNP was normal, and CBC and metabolic panel appeared somewhat similar to prior.  Creatinine improved to prior but she does have a mild leukocytosis.  Mild anemia also present.  Anticipate reassessment after ultrasound, CT PE study, and x-rays.  Anticipate reassessment to determine disposition.   9:02 PM Work-up began to return with some concerning findings.  Patient's DVT ultrasound shows evidence of left-sided DVT which is new for the patient although the imaging shows it being somewhat chronic appearing.  No evidence of DVT on the right leg which is concerning given the edema and normal BNP.  I am concerned that patient may have a DVT on the right that has left and caused clinical concern for pulmonary embolism.  The CT PE study did not show any central abnormality but due to motion artifact could not see small areas of the lungs.  I am concerned about occult PE not seen on imaging.  Patient's oxygen saturations have been up and down going to about 91% at the lowest on my observation during exams.  Patient reports he tried ambulate  and although she did not get hypoxic she got extremely winded and could not ambulate.  She is still complaining of severe pain in her legs.  The leg pain I suspect is more related to clot in her leg.  Patient thinks it could be her recent diagnosis of gout however the redness and swelling does not seem fully consistent with gout.  X-rays were obtained showing soft tissue swelling and on the right leg showed concern for possible hardware loosening versus infection although she  is not clinically tender over this location.  She reports that that ankle surgery was over 30 years ago.  Clinically I do not suspect joint infection as she is not having tenderness with joint movement at this time.  However, given the patient's exertional shortness of breath, new DVT, bilateral leg swelling and pain that is new, and her overall ill appearance, I am concerned about the patient going home.  Given the recent admission for stroke 1 month ago and recent TNK use, I spoke with neurology and pharmacy who feel that she does appear to be okay for anticoagulation.  Will discuss with admitting team if they would prefer heparin versus Eliquis for her for anticoagulation management of her new DVTs.  We will call for admission for further monitoring and management.         Final Clinical Impression(s) / ED Diagnoses Final diagnoses:  Peripheral edema  Exertional shortness of breath  Deep venous thrombosis (DVT) of left peroneal vein, unspecified chronicity (HCC)    Clinical Impression: 1. Peripheral edema   2. Exertional shortness of breath   3. Deep venous thrombosis (DVT) of left peroneal vein, unspecified chronicity (HCC)     Disposition: Admit  This note was prepared with assistance of Dragon voice recognition software. Occasional wrong-word or sound-a-like substitutions may have occurred due to the inherent limitations of voice recognition software.     Teddy Rebstock, Gwenyth Allegra, MD 02/08/22 2251

## 2022-02-08 NOTE — H&P (Signed)
History and Physical   BRIDGETT HATTABAUGH UQJ:[id number] DOB: 1956-01-07 DOA: 02/08/2022  PCP: Cory Munch, PA-C   Patient coming from: Home  Chief Complaint: Multiple complaints including shortness of breath, edema, foot pain  HPI: Shelby Morgan is a 66 y.o. female with medical history significant of alcoholic fatty liver disease, CVA, GI bleed, anxiety, GERD, obesity, diabetes, hypertension, hyperlipidemia presenting with shortness of breath edema and foot pain.  Patient noticed increased edema for the past 2 to 3 days and has had this in the past for which she takes an as needed diuretic but this is worse than typical for her.  Also noting some redness and pain in her feet she was treated recently with steroids for presumed gout flare which she responded to. She is additionally reporting some shortness of breath worse on exertion.  No hypoxia noted in the ED.  She denies fevers, chills, chest pain, abdominal pain, constipation, diarrhea, nausea, vomiting.  ED Course: Vital signs in the ED significant for heart rate in the 90s to 100s, respiratory rate in the teens to 20s.  Lab work-up included CMP with potassium 3.4, chloride 95, creatinine stable at 1.36, glucose 105, protein 8.2.  CBC with leukocytosis mild at 11 and hemoglobin stable at 11.9.  BMP normal, troponin normal x2.  Imaging included chest x-ray which showed no acute abnormality, left foot x-ray which showed stable soft tissue edema, right foot x-ray which showed stable soft tissue edema and no acute bony abnormality with questionable lucency that could represent questionable hardware loosening versus questionable infection.  CTA PE study was negative for PE but due to timing was limited for distal evaluation.  No other acute abnormality did show fatty liver.  DVT study positive for DVT in the left with some chronic features.  Patient received fentanyl in the ED.  Review of Systems: As per HPI otherwise all other systems reviewed  and are negative.  Past Medical History:  Diagnosis Date   Anxiety    Bell's palsy 01/08/2011   Chronic back pain    Depression    Diabetes mellitus    Diverticulosis    GERD (gastroesophageal reflux disease)    Hemorrhoids    Hepatomegaly    Hypertension    Lumbar stenosis    L2/L3--followed by Dr. Ernestina Patches   Melanoma [facility]) 2009   Dr Nevada Crane, Stage 2, required only surgery   NAFLD (nonalcoholic fatty liver disease)    Right thalamic stroke ([place]) 01/14/2022   Skin cancer    Stroke ([place]) 07/2021    Past Surgical History:  Procedure Laterality Date   CESAREAN SECTION     COLONOSCOPY  03/09/2011   Dr Oneida Alar diverticulosis, internal and external hemorrhoids   ESOPHAGOGASTRODUODENOSCOPY  03/09/2011   Esophageal stricture dilated to 16 MM savory, NSAID- induced gastritis and duodenitis   LEG SURGERY     plates/screws/hx fx-right leg   MELANOMA EXCISION     right knee   SAVORY DILATION  03/09/2011    Social History  reports that she quit smoking about 28 years ago. Her smoking use included cigarettes. She has a 5.50 pack-year smoking history. She has never used smokeless tobacco. She reports current alcohol use. She reports that she does not use drugs.  Allergies  Allergen Reactions   Codeine Nausea And Vomiting    Family History  Problem Relation Age of Onset   Heart disease Mother    Heart disease Maternal Grandmother    Heart disease Maternal Grandfather  COPD Father    Bladder Cancer Father    Anesthesia problems Neg Hx    Hypotension Neg Hx    Malignant hyperthermia Neg Hx    Pseudochol deficiency Neg Hx    Colon cancer Neg Hx   Reviewed on admission  Prior to Admission medications   Medication Sig Start Date End Date Taking? Authorizing Provider  acetaminophen (TYLENOL) 325 MG tablet Take 650 mg by mouth every 6 (six) hours as needed for moderate pain or mild pain.    [provider]  busPIRone (BUSPAR) 5 MG tablet Take 1 tablet (5 mg total) by mouth  2 (two) times daily. 01/28/22   Love, Ivan Anchors, PA-C  clopidogrel (PLAVIX) 75 MG tablet Take 1 tablet (75 mg total) by mouth daily. 01/28/22   Love, Ivan Anchors, PA-C  diclofenac Sodium (VOLTAREN) 1 % GEL Apply 4 g topically 4 (four) times daily. 01/28/22   Love, Ivan Anchors, PA-C  furosemide (LASIX) 20 MG tablet Take 1 tablet (20 mg total) by mouth every morning. 01/28/22   Love, Ivan Anchors, PA-C  gabapentin (NEURONTIN) 100 MG capsule Take 1 capsule (100 mg total) by mouth 3 (three) times daily. 01/28/22   Love, Ivan Anchors, PA-C  linagliptin (TRADJENTA) 5 MG TABS tablet Take 1 tablet (5 mg total) by mouth daily. 01/29/22   Love, Ivan Anchors, PA-C  metFORMIN (GLUCOPHAGE-XR) 500 MG 24 hr tablet Take 1 tablet (500 mg total) by mouth 2 (two) times daily. 01/28/22   Love, Ivan Anchors, PA-C  Multiple Vitamins-Minerals (CENTRUM SILVER 50+WOMEN) TABS Take 1 tablet by mouth daily.    [provider]  pantoprazole (PROTONIX) 40 MG tablet Take 1 tablet (40 mg total) by mouth daily. 01/28/22   Love, Ivan Anchors, PA-C  polyvinyl alcohol (LIQUIFILM TEARS) 1.4 % ophthalmic solution Place 1 drop into the left eye as needed for dry eyes. 01/28/22   Love, Ivan Anchors, PA-C  rosuvastatin (CRESTOR) 20 MG tablet Take 1 tablet (20 mg total) by mouth daily. 01/28/22   Love, Ivan Anchors, PA-C  traMADol (ULTRAM) 50 MG tablet Take 1 tablet (50 mg total) by mouth every 6 (six) hours as needed for moderate pain. 01/28/22   Love, Ivan Anchors, PA-C  traZODone (DESYREL) 50 MG tablet Take 1 tablet (50 mg total) by mouth at bedtime as needed for sleep. 01/28/22   Bary Leriche, PA-C    Physical Exam: Vitals:   02/08/22 1745 02/08/22 1830 02/08/22 1953 02/08/22 2000  BP: 113/64 105/61  117/73  Pulse: 98 94  94  Resp: 17 (!) 21  20  Temp:      TempSrc:      SpO2: 95% 96%  99%  Weight:   99 kg   Height:   '5\' 2"'$  (1.575 m)     Physical Exam Constitutional:      General: She is not in acute distress.    Appearance: Normal appearance.  HENT:     Head:  Normocephalic and atraumatic.     Mouth/Throat:     Mouth: Mucous membranes are moist.     Pharynx: Oropharynx is clear.  Eyes:     Extraocular Movements: Extraocular movements intact.     Pupils: Pupils are equal, round, and reactive to light.  Cardiovascular:     Rate and Rhythm: Normal rate and regular rhythm.     Pulses: Normal pulses.     Heart sounds: Normal heart sounds.  Pulmonary:     Effort: Pulmonary effort is normal. No  respiratory distress.     Breath sounds: Normal breath sounds.  Abdominal:     General: Bowel sounds are normal. There is no distension.     Palpations: Abdomen is soft.     Tenderness: There is no abdominal tenderness.  Musculoskeletal:        General: Tenderness (first MTPs and ankles) present. No swelling or deformity.     Right lower leg: Edema (Mildly greater than Left) present.     Left lower leg: Edema present.  Skin:    General: Skin is warm and dry.     Findings: Erythema (Near ankles and first mtp) present.  Neurological:     General: No focal deficit present.     Mental Status: Mental status is at baseline.    Labs on Admission: I have personally reviewed following labs and imaging studies  CBC: Recent Labs  Lab 02/08/22 1114  WBC 11.0*  NEUTROABS 7.5  HGB 11.9*  HCT 36.5  MCV 84.1  PLT 366    Basic Metabolic Panel: Recent Labs  Lab 02/08/22 1114  NA 137  K 3.4*  CL 95*  CO2 29  GLUCOSE 105*  BUN 13  CREATININE 1.36*  CALCIUM 9.4    GFR: Estimated Creatinine Clearance: 45.4 mL/min (A) (by C-G formula based on SCr of 1.36 mg/dL (H)).  Liver Function Tests: Recent Labs  Lab 02/08/22 1114  AST 16  ALT 19  ALKPHOS 51  BILITOT 0.9  PROT 8.2*  ALBUMIN 3.6    Urine analysis:    Component Value Date/Time   COLORURINE YELLOW 01/06/2022 1927   APPEARANCEUR CLEAR 01/06/2022 1927   LABSPEC 1.005 01/06/2022 1927   PHURINE 6.0 01/06/2022 1927   GLUCOSEU NEGATIVE 01/06/2022 1927   HGBUR NEGATIVE 01/06/2022 1927    BILIRUBINUR NEGATIVE 01/06/2022 1927   KETONESUR NEGATIVE 01/06/2022 1927   PROTEINUR NEGATIVE 01/06/2022 1927   UROBILINOGEN 0.2 01/08/2011 1649   NITRITE NEGATIVE 01/06/2022 1927   LEUKOCYTESUR NEGATIVE 01/06/2022 1927    Radiological Exams on Admission: CT Angio Chest PE W and/or Wo Contrast  Result Date: 02/08/2022 CLINICAL DATA:  Tachycardia, shortness of breath, chest pain EXAM: CT ANGIOGRAPHY CHEST WITH CONTRAST TECHNIQUE: Multidetector CT imaging of the chest was performed using the standard protocol during bolus administration of intravenous contrast. Multiplanar CT image reconstructions and MIPs were obtained to evaluate the vascular anatomy. RADIATION DOSE REDUCTION: This exam was performed according to the departmental dose-optimization program which includes automated exposure control, adjustment of the mA and/or kV according to patient size and/or use of iterative reconstruction technique. CONTRAST:  31m OMNIPAQUE IOHEXOL 350 MG/ML SOLN COMPARISON:  06/06/2018 FINDINGS: Cardiovascular: There is homogeneous enhancement in thoracic aorta. There are no intraluminal filling defect in central pulmonary artery branches. Evaluation of small peripheral branches is limited by motion artifact. Scattered coronary artery calcifications are seen. Mediastinum/Nodes: There are enlarged lymph nodes in mediastinum and both hilar regions with no significant interval change. Lungs/Pleura: Breathing motion limits evaluation of lung fields. As far as seen, there is no focal pulmonary consolidation. There is narrowing of AP diameter of the trachea and main bronchi, possibly suggesting bronchospasm. There is no pleural effusion or pneumothorax. Upper Abdomen: There is fatty infiltration in liver. There is minimal nodularity of liver surface. There is possible hyperplasia in left adrenal. Musculoskeletal: Degenerative changes are noted in thoracic spine with bony spurs. Review of the MIP images confirms the  above findings. IMPRESSION: There is no evidence of central pulmonary artery embolism. Evaluation of small  peripheral pulmonary artery branches is limited by motion artifact. There is no evidence of thoracic aortic dissection. There is no focal pulmonary consolidation. Coronary artery calcifications are seen. Fatty liver. There is minimal nodularity of the liver surface suggesting possible cirrhosis. Electronically Signed   By: Elmer Picker M.D.   On: 02/08/2022 19:29   VAS [place] LOWER EXTREMITY VENOUS (DVT) (ONLY [facility] & [facility])  Result Date: 02/08/2022  Lower Venous DVT Study Patient Name:  HALLEL DENHERDER  Date of Exam:   02/08/2022 Medical Rec #: [id number]       Accession #:    [id number] Date of Birth: 03-22-1956       Patient Gender: F Patient Age:   85 years Exam Location:  [facility] Procedure:      VAS [place] LOWER EXTREMITY VENOUS (DVT) Referring Phys: Marda Stalker --------------------------------------------------------------------------------  Indications: Bilateral lower extremity pitting edema and foot pain.  Risk Factors: Immobility (patient was in in patient rehab until last week) Recent stroke affecting left side. Limitations: Edema, patient movement. Comparison Study: Prior negative right LEV done 05/02/12 Performing Technologist: Sharion Dove RVS  Examination Guidelines: A complete evaluation includes B-mode imaging, spectral Doppler, color Doppler, and power Doppler as needed of all accessible portions of each vessel. Bilateral testing is considered an integral part of a complete examination. Limited examinations for reoccurring indications may be performed as noted. The reflux portion of the exam is performed with the patient in reverse Trendelenburg.  +---------+---------------+---------+-----------+----------+--------------+ RIGHT    CompressibilityPhasicitySpontaneityPropertiesThrombus Aging +---------+---------------+---------+-----------+----------+--------------+ CFV       Full           Yes      Yes                                 +---------+---------------+---------+-----------+----------+--------------+ SFJ      Full                                                        +---------+---------------+---------+-----------+----------+--------------+ FV Prox  Full                                                        +---------+---------------+---------+-----------+----------+--------------+ FV Mid   Full                                                        +---------+---------------+---------+-----------+----------+--------------+ FV DistalFull                                                        +---------+---------------+---------+-----------+----------+--------------+ PFV      Full                                                        +---------+---------------+---------+-----------+----------+--------------+  POP      Full           Yes      Yes                                 +---------+---------------+---------+-----------+----------+--------------+ PTV      Full                                                        +---------+---------------+---------+-----------+----------+--------------+ PERO     Full                                                        +---------+---------------+---------+-----------+----------+--------------+   +---------+---------------+---------+-----------+----------+--------------+ LEFT     CompressibilityPhasicitySpontaneityPropertiesThrombus Aging +---------+---------------+---------+-----------+----------+--------------+ CFV      Full           Yes      Yes                                 +---------+---------------+---------+-----------+----------+--------------+ SFJ      Full                                                        +---------+---------------+---------+-----------+----------+--------------+ FV Prox  Full                                                         +---------+---------------+---------+-----------+----------+--------------+ FV Mid   Full                                                        +---------+---------------+---------+-----------+----------+--------------+ FV DistalFull                                                        +---------+---------------+---------+-----------+----------+--------------+ PFV      Full                                                        +---------+---------------+---------+-----------+----------+--------------+ POP      Full           Yes      Yes                                 +---------+---------------+---------+-----------+----------+--------------+  PTV      Full                                                        +---------+---------------+---------+-----------+----------+--------------+ PERO     Partial        No       No                   Chronic        +---------+---------------+---------+-----------+----------+--------------+ Small segment of probable chronic DVT noted in one of the paired peroneal veins mid calf.    Summary: BILATERAL: -No evidence of popliteal cyst, bilaterally. RIGHT: - There is no evidence of deep vein thrombosis in the lower extremity.  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 - Small segment of probable chronic DVT noted in one of the paired peroneal veins mid calf.  *See table(s) above for measurements and observations. Electronically signed by Harold Barban MD on 02/08/2022 at 7:16:56 PM.    Final    DG Foot Complete Left  Result Date: 02/08/2022 CLINICAL DATA:  Foot pain, redness and swelling. EXAM: LEFT FOOT - COMPLETE 3+ VIEW COMPARISON:  01/20/2022 FINDINGS: No acute fracture. Stable alignment. Moderate midfoot dorsal spurring. Mild tibial talar spurring. Large plantar calcaneal spur and Achilles tendon enthesophyte. No erosion or bone destruction. Generalized  soft tissue edema similar to prior exam. Prominent vascular calcifications. No soft tissue gas or radiopaque foreign body. IMPRESSION: 1. Generalized soft tissue edema similar to prior exam. No soft tissue gas or radiopaque foreign body. 2. No acute osseous findings. Midfoot and ankle joint osteoarthritis. 3. Plantar calcaneal spur and Achilles tendon enthesophyte. Electronically Signed   By: Keith Rake M.D.   On: 02/08/2022 17:45   DG Foot Complete Right  Result Date: 02/08/2022 CLINICAL DATA:  Foot pain, redness and swelling. EXAM: RIGHT FOOT COMPLETE - 3+ VIEW COMPARISON:  Radiograph 01/19/2022 FINDINGS: Medial malleolar and distal fibular hardware partially visualized. Possible lucency about the medial malleolar screw, seen only on the oblique view. Degenerative spurring of the midfoot. Tibial talar osteoarthritis. Findings are similar to prior exam. Unchanged plantar calcaneal spur and Achilles tendon enthesophyte. No fracture. No erosive change or bony destruction. Generalized soft tissue edema, similar. No soft tissue gas or radiopaque foreign body. IMPRESSION: 1. Generalized soft tissue edema, similar to prior. No soft tissue gas or radiopaque foreign body. 2. 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 No acute osseous findings. 3. Surgical hardware in the distal tibia and fibula. Possible but not definite lucency about medial malleolar screw which may represent loosening or infection. Electronically Signed   By: Keith Rake M.D.   On: 02/08/2022 17:43   DG Chest 2 View  Result Date: 02/08/2022 CLINICAL DATA:  Shortness of breath EXAM: CHEST - 2 VIEW COMPARISON:  06/06/2018 FINDINGS: The heart size and mediastinal contours are within normal limits. No focal airspace consolidation, pleural effusion, or pneumothorax. The visualized skeletal structures are unremarkable. IMPRESSION: No active cardiopulmonary disease. Electronically Signed   By: Davina Poke D.O.   On:  02/08/2022 11:49    EKG: Independently reviewed.  Sinus tachycardia 100 bpm.  Some baseline artifact.  Nonspecific T wave flattening.  Low voltage in some leads laterally.  Assessment/Plan Principal Problem:   DVT (deep venous  thrombosis) (HCC) Active Problems:   Transaminitis   Anxiety   GERD (gastroesophageal reflux disease)   NAFLD (nonalcoholic fatty liver disease)   Obesity   Diabetes mellitus type 2 in obese ([place])   Primary hypertension   Hyperlipidemia associated with type 2 diabetes mellitus ([place])   History of CVA (cerebrovascular accident)   Acute DVT > Patient presenting with some shortness of breath and edema as well as pain in her feet. > Imaging in the ED was positive for chronic appearing left lower extremity DVT. > CTA chest was negative for PE but did not fully evaluate distal vessels due to contrast timing. > She does have some shortness of breath but no hypoxia in the ED.  It is possible that she has a smaller distal PE that was not picked up. > Patient had admission for stroke where she received thrombolytics last month.  EDP discussed this with pharmacy and neurology to ensure safe to start anticoagulation and received feedback that is safe to restart in most cases after 2 weeks even has a large stroke. - Monitor on telemetry - Start Eliquis - Echo last month for CVA work-up   Edema Foot pain > Patient states she has had some history of edema and has taken as needed diuretic for this in the past.  But this edema is worse. > Could be worsening in setting of DVT which is noted positive in the left, there is a possibility that she also had a right-sided DVT and had small vessel PE as above though there is no clear evidence of this based on imaging we do have. > Foot pain may also be due to gout as she did receive steroids for treatment of gout while previously admitted which she responded to.  > X-ray of the feet in the ED showed stable soft tissue edema and on the  right a questionable lucency which could represent hardware issue or cannot rule out infection. > Suspect some of this is her chronic edema exacerbated by DVT.  With pain possibly secondary to recurrent gout given location. - We will start prednisone for suspected gout flare - Trend fever curve and WBC  Leukocytosis > Suspected to be reactive at this time.  No fever.  No evidence of lung disease on chest x-ray or CTA PE study.  No urinary symptoms. > Questionable changes on right foot x-ray with lower suspicion for issues with her hardware or infection as her surgery was decades ago. - Trend CBC  Hypokalemia ?CKD, elevated serum creatinine > Potassium noted to be 3.4 in ED.  Creatinine noted to be stable at 1.36 though no diagnosis of CKD is in the chart there is previous diagnoses of AKI but I cannot see when creatinine was recently normal. - 40 mEq p.o. potassium - Check magnesium - Trend renal function electrolytes - IV fluids overnight.  History of CVA > Admitted for stroke last month and received thrombolytics. - Continue home rosuvastatin, Plavix  Anxiety - Continue home BuSpar, trazodone  Diabetes - SSI  Hyperlipidemia - Continue home rosuvastatin  GERD - Continue home PPI  Obesity Nonalcoholic fatty liver disease - Noted  DVT prophylaxis: Eliquis Code Status:   Full Family Communication:  None on admission  Disposition Plan:   Patient is from:  Home  Anticipated DC to:  Home  Anticipated DC date:  1 to 2 days  Anticipated DC barriers: None  Consults called:  None Admission status:  Observation, telemetry  Severity of Illness: The appropriate  patient status for this patient is OBSERVATION. Observation status is judged to be reasonable and necessary in order to provide the required intensity of service to ensure the patient's safety. The patient's presenting symptoms, physical exam findings, and initial radiographic and laboratory data in the context of their  medical condition is felt to place them at decreased risk for further clinical deterioration. Furthermore, it is anticipated that the patient will be medically stable for discharge from the hospital within 2 midnights of admission.    Marcelyn Bruins MD [facility]  How to contact the [facility] Attending or Consulting provider [place] or covering provider during after hours [place], for this patient?   Check the care team in [facility] and look for a) attending/consulting [facility] provider listed and b) the [facility] team listed Log into [email or web address] and use [facility]'s universal password to access. If you do not have the password, please contact the hospital operator. Locate the [facility] provider you are looking for under [facility] and page to a number that you can be directly reached. If you still have difficulty reaching the provider, please page the [facility] (Director on Call) for the Hospitalists listed on amion for assistance.  02/08/2022, 10:42 PM

## 2022-02-08 NOTE — ED Triage Notes (Signed)
C/o swelling to bilateral feet x 2 days with SOB.  Also reports mild LLQ pain that she believes is related to blood thinner injections she had been receiving.

## 2022-02-08 NOTE — ED Notes (Signed)
Tried to ambulate pt, pt stated that it hurts too much to stand and walk on foot. Pt was placed back into bed. Will notify RN and MD

## 2022-02-08 NOTE — ED Provider Triage Note (Signed)
Emergency Medicine Provider Triage Evaluation Note  Shelby Morgan , a 66 y.o. female  was evaluated in triage.  Pt complains of shortness of breath.  Patient states that shortness of breath has gotten progressively worse over the past 2 days.  She is also noted fluid retention on her lower extremities saying "I have swole up like a bullfrog."  She states he was recently seen by her primary care provider this past week and he stated that she had decreased renal function and sample appoint with urology but she has not seen the urologist.  She notes increasing shortness of breath with physical activity.  She has been unable to get her shoes on the past couple days.  Denies chest pain, abdominal pain, fever, chills, night sweats, cough, nausea, vomiting  Review of Systems  Positive: See above Negative:   Physical Exam  BP 124/81 (BP Location: Left Arm)   Pulse (!) 109   Temp 98.1 F (36.7 C) (Oral)   Resp 18   SpO2 97%  Gen:   Awake, no distress   Resp:  Normal effort  MSK:   Moves extremities without difficulty  Other:  Possible Rales auscultated on respiratory exam.  No murmurs gallops or rubs appreciated.  1-2+ pitting edema noted bilaterally lower extremities.  Nontender abdomen.  Medical Decision Making  Medically screening exam initiated at 11:11 AM.  Appropriate orders placed.  Shelby Morgan was informed that the remainder of the evaluation will be completed by another provider, this initial triage assessment does not replace that evaluation, and the importance of remaining in the ED until their evaluation is complete.     Wilnette Kales, [place] 02/08/22 1114

## 2022-02-08 NOTE — Progress Notes (Signed)
ANTICOAGULATION CONSULT NOTE - Initial Consult  Pharmacy Consult for apixaban Indication: DVT and possible pulmonary embolus  Allergies  Allergen Reactions   Codeine Nausea And Vomiting    Patient Measurements: Height: '5\' 2"'$  (157.5 cm) Weight: 99 kg (218 lb 4.1 oz) IBW/kg (Calculated) : 50.1  Vital Signs: BP: 117/73 (07/16 2000) Pulse Rate: 94 (07/16 2000)  Labs: Recent Labs    02/08/22 1114 02/08/22 1332  HGB 11.9*  --   HCT 36.5  --   PLT 337  --   CREATININE 1.36*  --   TROPONINIHS 4 4    Estimated Creatinine Clearance: 45.4 mL/min (A) (by C-G formula based on SCr of 1.36 mg/dL (H)).   Medical History: Past Medical History:  Diagnosis Date   Anxiety    Bell's palsy 01/08/2011   Chronic back pain    Depression    Diabetes mellitus    Diverticulosis    GERD (gastroesophageal reflux disease)    Hemorrhoids    Hepatomegaly    Hypertension    Lumbar stenosis    L2/L3--followed by Dr. Ernestina Patches   Melanoma [facility]) 2009   Dr [place] Crane, Stage 2, required only surgery   NAFLD (nonalcoholic fatty liver disease)    Right thalamic stroke ([place]) 01/14/2022   Skin cancer    Stroke ([place]) 07/2021     Assessment: Shelby Morgan is a 66 y.o. female with recent admission for acute CVA on 6/13 and received thrombolytics at that time. DVT study positive for DVT in the left with some chronic features. CTA PE study was negative for PE but due to timing was limited for distal evaluation.  Patient not on anticoagulation prior to admission.  Of note: Is on Plavix daily and starting on prednisone 40 mg daily for gout flare.  Goal of Therapy:  Therapeutic anticoagulation Monitor platelets by anticoagulation protocol: Yes   Plan:  Start Apixaban 10 mg PO BID x 7 days, followed by Apixaban 5 mg PO BID Monitor for signs and symptoms of bleeding.    Thank you for allowing [place] to participate in this patients care. Jens Som, PharmD 02/08/2022 11:29 PM  **Pharmacist phone  directory can be found on [email or web address] listed under [place]**

## 2022-02-09 ENCOUNTER — Other Ambulatory Visit (HOSPITAL_COMMUNITY): Payer: Self-pay

## 2022-02-09 ENCOUNTER — Encounter: Payer: Medicare HMO | Admitting: Physical Medicine & Rehabilitation

## 2022-02-09 ENCOUNTER — Telehealth (HOSPITAL_COMMUNITY): Payer: Self-pay | Admitting: Pharmacy Technician

## 2022-02-09 DIAGNOSIS — I82402 Acute embolism and thrombosis of unspecified deep veins of left lower extremity: Secondary | ICD-10-CM | POA: Diagnosis not present

## 2022-02-09 LAB — CBC
HCT: 31.8 % — ABNORMAL LOW (ref 36.0–46.0)
Hemoglobin: 10.4 g/dL — ABNORMAL LOW (ref 12.0–15.0)
MCH: 28.2 pg (ref 26.0–34.0)
MCHC: 32.7 g/dL (ref 30.0–36.0)
MCV: 86.2 fL (ref 80.0–100.0)
Platelets: 299 10*3/uL (ref 150–400)
RBC: 3.69 MIL/uL — ABNORMAL LOW (ref 3.87–5.11)
RDW: 13.6 % (ref 11.5–15.5)
WBC: 8.8 10*3/uL (ref 4.0–10.5)
nRBC: 0 % (ref 0.0–0.2)

## 2022-02-09 LAB — CBG MONITORING, ED
Glucose-Capillary: 193 mg/dL — ABNORMAL HIGH (ref 70–99)
Glucose-Capillary: 323 mg/dL — ABNORMAL HIGH (ref 70–99)
Glucose-Capillary: 209 mg/dL — ABNORMAL HIGH (ref 70–99)

## 2022-02-09 LAB — BASIC METABOLIC PANEL
Anion gap: 6 (ref 5–15)
BUN: 12 mg/dL (ref 8–23)
CO2: 27 mmol/L (ref 22–32)
Calcium: 8.9 mg/dL (ref 8.9–10.3)
Chloride: 103 mmol/L (ref 98–111)
Creatinine, Ser: 1.24 mg/dL — ABNORMAL HIGH (ref 0.44–1.00)
GFR, Estimated: 48 mL/min — ABNORMAL LOW (ref >60)
Glucose, Bld: 151 mg/dL — ABNORMAL HIGH (ref 70–99)
Potassium: 4.7 mmol/L (ref 3.5–5.1)
Sodium: 136 mmol/L (ref 135–145)

## 2022-02-09 LAB — GLUCOSE, CAPILLARY: Glucose-Capillary: 148 mg/dL — ABNORMAL HIGH (ref 70–99)

## 2022-02-09 LAB — MAGNESIUM: Magnesium: 2.3 mg/dL (ref 1.7–2.4)

## 2022-02-09 MED ORDER — INSULIN GLARGINE-YFGN 100 UNIT/ML ~~LOC~~ SOLN
5.0000 [IU] | Freq: Every day | SUBCUTANEOUS | Status: DC
  Administered 2022-02-09 – 2022-02-15 (×7): 5 [IU] via SUBCUTANEOUS
  Filled 2022-02-09 (×9): qty 0.05

## 2022-02-09 MED ORDER — INSULIN ASPART 100 UNIT/ML IJ SOLN
3.0000 [IU] | Freq: Three times a day (TID) | INTRAMUSCULAR | Status: DC
  Administered 2022-02-10 – 2022-02-12 (×7): 3 [IU] via SUBCUTANEOUS

## 2022-02-09 MED ORDER — LACTATED RINGERS IV SOLN: INTRAVENOUS | Status: AC

## 2022-02-09 MED ORDER — PNEUMOCOCCAL 20-VAL CONJ VACC 0.5 ML IM SUSY
0.5000 mL | PREFILLED_SYRINGE | INTRAMUSCULAR | Status: AC
  Administered 2022-02-12: 0.5 mL via INTRAMUSCULAR
  Filled 2022-02-09: qty 0.5

## 2022-02-09 NOTE — Progress Notes (Signed)
Patient received from ED via [place]. Pt Ao x4. CHG bath completed. Connected to tele and CCMD notified. Oriented pt to room and call bell system. Call bell within reach. Will continue to monitor.

## 2022-02-09 NOTE — Discharge Instructions (Signed)

## 2022-02-09 NOTE — Progress Notes (Signed)
Progress Note   Patient: Shelby Morgan:[id number] DOB: Oct 30, 1955 DOA: 02/08/2022     0 DOS: the patient was seen and examined on 02/09/2022   Brief hospital course: 66 year old female with PMH of HTN, HL, DM2, 03/2021 and 12/2021 CVAs (on Plavix) who presented to the ED on 7/16 with left leg pain found to have a DVT in the left peroneal vein, left foot gout, and a mild AKI.  CTA of the chest showed no large PE but had limited evaluation of the distal arteries.  Assessment and Plan:  Acute LLE DVT in the Left Peroneal Vein: Chest CTA does now show any large PE - small vessels are difficult to see due to lack of contrast and artifact (see report).  Echo one month ago showed no right heart strain. - Continue Eliquis (7 days 10 mg followed by 5 mg BID for 3-6 months).  Acute on Chronic Gout of the left foot: - This is Day 2 out of 5 of steroids.  Mild Lower Extremity Edema: - Hold home Lasix.  Hypokalemia, resolved:  AKI on CKD: - Creatinine is down to 1.24 with baseline ~ 1.1 mg/dL. - Continue gentle fluids x 10 hours and repeat BMP in am. - Hold Lasix.  Diabetes Mellitus Type 2: Glucose is running high on steroids. - Start Lantus 5 units nightly and 3 units pre-meal insulin. - Continue SSI with POC glucose ACHS.  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 - Continue Statin.  Hypertension: BP is stable.  Hyperlipidemia: - Statin.  GERD: - PPI  OSA: - Recommend CPAP nightly.  Physical Deconditioning: Patient uses walker at baseline. - Consult PT/OT for evaluation.  Her son is concerned he cannot take care of her at home.  Right Ankle Surgery Morbid Obesity: BMI 39.92 kg/m2 Fatty Liver Disease 2012 GI bleed 2012 Bell's Palsy 2009 Melanoma, Stage, had surgery  Subjective: Shelby Morgan is lying in bed comfortably.  She denies any chest pain or shortness of breath. Respiratory status is stable on  room air. She states her left foot hurts.  She is unable to walk or bear weight at this time. She is anxious about her feet swelling up.  I assured her we would resume her Lasix tomorrow.  Her edema is minimal.  The swelling is more related to gout and DVT than edema. I spoke with son over the phone who is concerned about patient's ability to ambulate and his ability to take care of her at home.  I placed PT/OT evaluation.    Physical Exam: Vitals:   02/09/22 1215 02/09/22 1300 02/09/22 1345 02/09/22 1500  BP:  (!) 153/104 127/76 (!) 135/107  Pulse:  87 94 95  Resp:  '15 19 19  '$ Temp: 98.3 F (36.8 C)     TempSrc: Oral     SpO2:  99% 97% 98%  Weight:      Height:       Examination: General exam: chronically ill appearing, obese, endorses left leg pain. HEENT: NCAT, PERRL Respiratory system: CTAB no WRR Cardiovascular system: Did not appreciate a murmur, regular, No JVD. Gastrointestinal system: No flank pain, Abdomen soft, NT,ND, BS+. Nervous System: No focal deficits. Extremities: left foot tenderness and swelling, distal peripheral pulses palpable.  Skin: No rashes, No bruises, No icterus. MSK: Physical Deconditioning  Data Reviewed: CT Angio Chest PE W and/or Wo Contrast CLINICAL DATA:  Tachycardia, shortness of breath, chest  pain  EXAM: CT ANGIOGRAPHY CHEST WITH CONTRAST  TECHNIQUE: Multidetector CT imaging of the chest was performed using the standard protocol during bolus administration of intravenous contrast. Multiplanar CT image reconstructions and MIPs were obtained to evaluate the vascular anatomy.  RADIATION DOSE REDUCTION: This exam was performed according to the departmental dose-optimization program which includes automated exposure control, adjustment of the mA and/or kV according to patient size and/or use of iterative reconstruction technique.  CONTRAST:  110m OMNIPAQUE IOHEXOL 350 MG/ML SOLN  COMPARISON:  06/06/2018  FINDINGS: Cardiovascular: There  is homogeneous enhancement in thoracic aorta. There are no intraluminal filling defect in central pulmonary artery branches. Evaluation of small peripheral branches is limited by motion artifact. Scattered coronary artery calcifications are seen.  Mediastinum/Nodes: There are enlarged lymph nodes in mediastinum and both hilar regions with no significant interval change.  Lungs/Pleura: Breathing motion limits evaluation of lung fields. As far as seen, there is no focal pulmonary consolidation. There is narrowing of AP diameter of the trachea and main bronchi, possibly suggesting bronchospasm. There is no pleural effusion or pneumothorax.  Upper Abdomen: There is fatty infiltration in liver. There is minimal nodularity of liver surface. There is possible hyperplasia in left adrenal.  Musculoskeletal: Degenerative changes are noted in thoracic spine with bony spurs.  Review of the MIP images confirms the above findings.  IMPRESSION: There is no evidence of central pulmonary artery embolism. Evaluation of small peripheral pulmonary artery branches is limited by motion artifact. There is no evidence of thoracic aortic dissection. There is no focal pulmonary consolidation.  Coronary artery calcifications are seen. Fatty liver. There is minimal nodularity of the liver surface suggesting possible cirrhosis.  Electronically Signed   By: PElmer PickerM.D.   On: 02/08/2022 19:29 VAS [place]OWER EXTREMITY VENOUS (DVT) (ONLY [facility] & [facility])  Lower Venous DVT Study  Patient Name:  PJAYSHA Morgan Date of Exam:   02/08/2022 Medical Rec #: [id number]      Accession #:    [id number] of Birth: [birth year]      Patient Gender: F Patient Age:   89years [facility]      VAS [place] EXTREMITY VENOUS (DVT) Referring Phys: CMarda Stalker --------------------------------------------------------------------------------   Indications: Bilateral lower  extremity pitting edema and foot pain.   Risk Factors: Immobility (patient was in in patient rehab until last week) Recent stroke affecting left side. Limitations: Edema, patient movement. Comparison Study: Prior negative right LEV done 05/02/12  Performing Technologist: CSharion DoveRVS    Examination Guidelines: A complete evaluation includes B-mode imaging, spectral Doppler, color Doppler, and power Doppler as needed of all accessible portions of each vessel. Bilateral testing is considered an integral part of a complete examination. Limited examinations for reoccurring indications may be performed as noted. The reflux portion of the exam is performed with the patient in reverse Trendelenburg.     +---------+---------------+---------+-----------+----------+--------------+ RIGHT    CompressibilityPhasicitySpontaneityPropertiesThrombus Aging +---------+---------------+---------+-----------+----------+--------------+ CFV      Full           Yes      Yes                                 +---------+---------------+---------+-----------+----------+--------------+ SFJ      Full                                                        +---------+---------------+---------+-----------+----------+--------------+  FV Prox  Full                                                        +---------+---------------+---------+-----------+----------+--------------+ FV Mid   Full                                                        +---------+---------------+---------+-----------+----------+--------------+ FV DistalFull                                                        +---------+---------------+---------+-----------+----------+--------------+ PFV      Full                                                        +---------+---------------+---------+-----------+----------+--------------+ POP      Full           Yes      Yes                                  +---------+---------------+---------+-----------+----------+--------------+ PTV      Full                                                        +---------+---------------+---------+-----------+----------+--------------+ PERO     Full                                                        +---------+---------------+---------+-----------+----------+--------------+        +---------+---------------+---------+-----------+----------+--------------+ LEFT     CompressibilityPhasicitySpontaneityPropertiesThrombus Aging +---------+---------------+---------+-----------+----------+--------------+ CFV      Full           Yes      Yes                                 +---------+---------------+---------+-----------+----------+--------------+ SFJ      Full                                                        +---------+---------------+---------+-----------+----------+--------------+ FV Prox  Full                                                        +---------+---------------+---------+-----------+----------+--------------+  FV Mid   Full                                                        +---------+---------------+---------+-----------+----------+--------------+ FV DistalFull                                                        +---------+---------------+---------+-----------+----------+--------------+ PFV      Full                                                        +---------+---------------+---------+-----------+----------+--------------+ POP      Full           Yes      Yes                                 +---------+---------------+---------+-----------+----------+--------------+ PTV      Full                                                        +---------+---------------+---------+-----------+----------+--------------+ PERO     Partial        No       No                   Chronic         +---------+---------------+---------+-----------+----------+--------------+  Small segment of probable chronic DVT noted in one of the paired peroneal veins mid calf.          Summary: BILATERAL: -No evidence of popliteal cyst, bilaterally. RIGHT: - There is no evidence of deep vein thrombosis in the lower extremity.   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 - Small segment of probable chronic DVT noted in one of the paired peroneal veins mid calf.    *See table(s) above for measurements and observations.  Electronically signed by Harold Barban MD on 02/08/2022 at 7:16:56 PM.      Final   DG Foot Complete Left CLINICAL DATA:  Foot pain, redness and swelling.  EXAM: LEFT FOOT - COMPLETE 3+ VIEW  COMPARISON:  01/20/2022  FINDINGS: No acute fracture. Stable alignment. Moderate midfoot dorsal spurring. Mild tibial talar spurring. Large plantar calcaneal spur and Achilles tendon enthesophyte. No erosion or bone destruction. Generalized soft tissue edema similar to prior exam. Prominent vascular calcifications. No soft tissue gas or radiopaque foreign body.  IMPRESSION: 1. Generalized soft tissue edema similar to prior exam. No soft tissue gas or radiopaque foreign body. 2. No acute osseous findings. Midfoot and ankle joint osteoarthritis. 3. Plantar calcaneal spur and Achilles tendon enthesophyte.  Electronically Signed   By: Keith Rake M.D.   On: 02/08/2022 17:45 DG Foot Complete Right CLINICAL DATA:  Foot pain, redness and swelling.  EXAM: RIGHT  FOOT COMPLETE - 3+ VIEW  COMPARISON:  Radiograph 01/19/2022  FINDINGS: Medial malleolar and distal fibular hardware partially visualized. Possible lucency about the medial malleolar screw, seen only on the oblique view. Degenerative spurring of the midfoot. Tibial talar osteoarthritis. Findings are similar to prior exam. Unchanged plantar calcaneal  spur and Achilles tendon enthesophyte. No fracture. No erosive change or bony destruction. Generalized soft tissue edema, similar. No soft tissue gas or radiopaque foreign body.  IMPRESSION: 1. Generalized soft tissue edema, similar to prior. No soft tissue gas or radiopaque foreign body. 2. 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 No acute osseous findings. 3. Surgical hardware in the distal tibia and fibula. Possible but not definite lucency about medial malleolar screw which may represent loosening or infection.  Electronically Signed   By: Keith Rake M.D.   On: 02/08/2022 17:43 DG Chest 2 View CLINICAL DATA:  Shortness of breath  EXAM: CHEST - 2 VIEW  COMPARISON:  06/06/2018  FINDINGS: The heart size and mediastinal contours are within normal limits. No focal airspace consolidation, pleural effusion, or pneumothorax. The visualized skeletal structures are unremarkable.  IMPRESSION: No active cardiopulmonary disease.  Electronically Signed   By: Davina Poke D.O.   On: 02/08/2022 11:49    Family Communication: I spoke with son over the phone.  All of his questions were answered.  Disposition: Status is:  The patient will require care spanning > 2 midnights and should be moved to inpatient because: need for IV fluids and monitoring.  Needs PT/OT evaluation given difficulty with ambulation.  Planned Discharge Destination: Home, Home with Home Health, and Skilled nursing facility DVT prophylaxis: None.  On Eliquis.  Time spent: >30 minutes  Author: George Hugh, MD 02/09/2022 3:24 PM  For on call review [email or web address].

## 2022-02-09 NOTE — Progress Notes (Signed)
Inpatient Diabetes Program Recommendations  AACE/ADA: New Consensus Statement on Inpatient Glycemic Control (2015)  Target Ranges:  Prepandial:   less than 140 mg/dL      Peak postprandial:   less than 180 mg/dL (1-2 hours)      Critically ill patients:  140 - 180 mg/dL   Lab Results  Component Value Date   GLUCAP 323 (H) 02/09/2022   HGBA1C 7.1 (H) 01/07/2022    Review of Glycemic Control  Latest Reference Range & Units 02/08/22 23:43 02/09/22 08:05 02/09/22 11:58  Glucose-Capillary 70 - 99 mg/dL 91 193 (H) 323 (H)   Diabetes history: DM 2 Outpatient Diabetes medications:  Amaryl 2 mg daily, Tradjenta 5 mg daily Current orders for Inpatient glycemic control:  Novolog 0-15 units tid with meals Prednisone 40 mg daily  Inpatient Diabetes Program Recommendations:    Consider adding Novolog meal coverage 3 units tid with meals (hold if patient eats less than 50% or NPO).   Thanks,  Adah Perl, RN, BC-ADM Inpatient Diabetes Coordinator Pager [phone]  (8a-5p)

## 2022-02-09 NOTE — TOC Benefit Eligibility Note (Signed)
Patient Teacher, English as a foreign language completed.    The patient is currently admitted and upon discharge could be taking Eliquis Starter Pack.  The current 30 day co-pay is, $47.00.   The patient is insured through [employer] [place] Patient Advocate Specialist [place] Patient Advocate Team Direct Number: [phone]  Fax: [phone]

## 2022-02-09 NOTE — ED Notes (Signed)
ED TO INPATIENT HANDOFF REPORT   S Name/Age/Gender Shelby Morgan 66 y.o. female Room/Bed: 045C/045C  Code Status   Code Status: Full Code  Home/SNF/Other Home Patient oriented to: self, place, time, and situation Is this baseline? Yes   Triage Complete: Triage complete  Chief Complaint DVT (deep venous thrombosis) (HCC) [I82.409]  Triage Note C/o swelling to bilateral feet x 2 days with SOB.  Also reports mild LLQ pain that she believes is related to blood thinner injections she had been receiving.   Allergies Allergies  Allergen Reactions   Codeine Nausea And Vomiting    Level of Care/Admitting Diagnosis ED Disposition     ED Disposition  Admit   Condition  --   [place]: [place] [[id number]]  Level of Care: Telemetry Medical [104]  May place patient in observation at [facility] or [place] if equivalent level of care is available:: No  Covid Evaluation: Asymptomatic - no recent exposure (last 10 days) testing not required  Diagnosis: DVT (deep venous thrombosis) [facility]) [[id number]]  Admitting Physician: Marcelyn Bruins [[id number]]  Attending Physician: Marcelyn Bruins [[id number]]          B Medical/Surgery History Past Medical History:  Diagnosis Date   Anxiety    Bell's palsy 01/08/2011   Chronic back pain    Depression    Diabetes mellitus    Diverticulosis    GERD (gastroesophageal reflux disease)    Hemorrhoids    Hepatomegaly    Hypertension    Lumbar stenosis    L2/L3--followed by Dr. Ernestina Patches   Melanoma [facility]) 2009   Dr [place] Crane, Stage 2, required only surgery   NAFLD (nonalcoholic fatty liver disease)    Right thalamic stroke ([place]) 01/14/2022   Skin cancer    Stroke ([place]) 07/2021   Past Surgical History:  Procedure Laterality Date   CESAREAN SECTION     COLONOSCOPY  03/09/2011   Dr Oneida Alar diverticulosis, internal and external hemorrhoids   ESOPHAGOGASTRODUODENOSCOPY  03/09/2011   Esophageal stricture  dilated to 16 MM savory, NSAID- induced gastritis and duodenitis   LEG SURGERY     plates/screws/hx fx-right leg   MELANOMA EXCISION     right knee   SAVORY DILATION  03/09/2011     A IV Location/Drains/Wounds Patient Lines/Drains/Airways Status     Active Line/Drains/Airways     Name Placement date Placement time Site Days   Peripheral IV 02/08/22 20 G Anterior;Distal;Left;Upper Arm 02/08/22  1727  Arm  1            Intake/Output Last 24 hours  Intake/Output Summary (Last 24 hours) at 02/09/2022 1843 Last data filed at 02/09/2022 0827 Gross per 24 hour  Intake 1079.04 ml  Output --  Net 1079.04 ml    Labs/Imaging Results for orders placed or performed during the hospital encounter of 02/08/22 (from the past 48 hour(s))  CBC with Differential     Status: Abnormal   Collection Time: 02/08/22 11:14 AM  Result Value Ref Range   WBC 11.0 (H) 4.0 - 10.5 K/uL   RBC 4.34 3.87 - 5.11 MIL/uL   Hemoglobin 11.9 (L) 12.0 - 15.0 g/dL   HCT 36.5 36.0 - 46.0 %   MCV 84.1 80.0 - 100.0 fL   MCH 27.4 26.0 - 34.0 pg   MCHC 32.6 30.0 - 36.0 g/dL   RDW 13.5 11.5 - 15.5 %   Platelets 337 150 - 400 K/uL   nRBC 0.0 0.0 - 0.2 %  Neutrophils Relative % 68 %   Neutro Abs 7.5 1.7 - 7.7 K/uL   Lymphocytes Relative 16 %   Lymphs Abs 1.7 0.7 - 4.0 K/uL   Monocytes Relative 11 %   Monocytes Absolute 1.2 (H) 0.1 - 1.0 K/uL   Eosinophils Relative 4 %   Eosinophils Absolute 0.4 0.0 - 0.5 K/uL   Basophils Relative 1 %   Basophils Absolute 0.1 0.0 - 0.1 K/uL   Immature Granulocytes 0 %   Abs Immature Granulocytes 0.03 0.00 - 0.07 K/uL    Comment: Performed at [facility] [address] [place], [place] [address]  Brain natriuretic peptide     Status: None   Collection Time: 02/08/22 11:14 AM  Result Value Ref Range   B Natriuretic Peptide 6.6 0.0 - 100.0 pg/mL    Comment: Performed at [facility] [address] [place], [place] [address]  Comprehensive metabolic panel      Status: Abnormal   Collection Time: 02/08/22 11:14 AM  Result Value Ref Range   Sodium 137 135 - 145 mmol/L   Potassium 3.4 (L) 3.5 - 5.1 mmol/L   Chloride 95 (L) 98 - 111 mmol/L   CO2 29 22 - 32 mmol/L   Glucose, Bld 105 (H) 70 - 99 mg/dL    Comment: Glucose reference range applies only to samples taken after fasting for at least 8 hours.   BUN 13 8 - 23 mg/dL   Creatinine, Ser 1.36 (H) 0.44 - 1.00 mg/dL   Calcium 9.4 8.9 - 10.3 mg/dL   Total Protein 8.2 (H) 6.5 - 8.1 g/dL   Albumin 3.6 3.5 - 5.0 g/dL   AST 16 15 - 41 U/L   ALT 19 0 - 44 U/L   Alkaline Phosphatase 51 38 - 126 U/L   Total Bilirubin 0.9 0.3 - 1.2 mg/dL   GFR, Estimated 43 (L) >60 mL/min    Comment: (NOTE) Calculated using the CKD-EPI Creatinine Equation (2021)    Anion gap 13 5 - 15    Comment: Performed at [facility] [address], [place], [place] [address]  Troponin I (High Sensitivity)     Status: None   Collection Time: 02/08/22 11:14 AM  Result Value Ref Range   Troponin I (High Sensitivity) 4 <18 ng/L    Comment: (NOTE) Elevated high sensitivity troponin I (hsTnI) values and significant  changes across serial measurements may suggest ACS but many other  chronic and acute conditions are known to elevate hsTnI results.  Refer to the "Links" section for chest pain algorithms and additional  guidance. Performed at [facility], [place] [address], [place], [place] [address]   Troponin I (High Sensitivity)     Status: None   Collection Time: 02/08/22  1:32 PM  Result Value Ref Range   Troponin I (High Sensitivity) 4 <18 ng/L    Comment: (NOTE) Elevated high sensitivity troponin I (hsTnI) values and significant  changes across serial measurements may suggest ACS but many other  chronic and acute conditions are known to elevate hsTnI results.  Refer to the "Links" section for chest pain algorithms and additional  guidance. Performed at [facility], [place] [address], [place],   [address]   CBG monitoring, ED     Status: None   Collection Time: 02/08/22 11:43 PM  Result Value Ref Range   Glucose-Capillary 91 70 - 99 mg/dL    Comment: Glucose reference range applies only  to samples taken after fasting for at least 8 hours.  Magnesium     Status: None   Collection Time: 02/09/22  6:01 AM  Result Value Ref Range   Magnesium 2.3 1.7 - 2.4 mg/dL    Comment: Performed at [facility] [address] [place], [place] [address]  Basic metabolic panel     Status: Abnormal   Collection Time: 02/09/22  6:01 AM  Result Value Ref Range   Sodium 136 135 - 145 mmol/L   Potassium 4.7 3.5 - 5.1 mmol/L   Chloride 103 98 - 111 mmol/L   CO2 27 22 - 32 mmol/L   Glucose, Bld 151 (H) 70 - 99 mg/dL    Comment: Glucose reference range applies only to samples taken after fasting for at least 8 hours.   BUN 12 8 - 23 mg/dL   Creatinine, Ser 1.24 (H) 0.44 - 1.00 mg/dL   Calcium 8.9 8.9 - 10.3 mg/dL   GFR, Estimated 48 (L) >60 mL/min    Comment: (NOTE) Calculated using the CKD-EPI Creatinine Equation (2021)    Anion gap 6 5 - 15    Comment: Performed at [facility] [address] [place], [place] [address]  CBC     Status: Abnormal   Collection Time: 02/09/22  6:01 AM  Result Value Ref Range   WBC 8.8 4.0 - 10.5 K/uL   RBC 3.69 (L) 3.87 - 5.11 MIL/uL   Hemoglobin 10.4 (L) 12.0 - 15.0 g/dL   HCT 31.8 (L) 36.0 - 46.0 %   MCV 86.2 80.0 - 100.0 fL   MCH 28.2 26.0 - 34.0 pg   MCHC 32.7 30.0 - 36.0 g/dL   RDW 13.6 11.5 - 15.5 %   Platelets 299 150 - 400 K/uL   nRBC 0.0 0.0 - 0.2 %    Comment: Performed at [facility], [place] [address], [place], [place] [address]  CBG monitoring, ED     Status: Abnormal   Collection Time: 02/09/22  8:05 AM  Result Value Ref Range   Glucose-Capillary 193 (H) 70 - 99 mg/dL    Comment: Glucose reference range applies only to samples taken after fasting for at least 8 hours.  CBG monitoring, ED     Status: Abnormal    Collection Time: 02/09/22 11:58 AM  Result Value Ref Range   Glucose-Capillary 323 (H) 70 - 99 mg/dL    Comment: Glucose reference range applies only to samples taken after fasting for at least 8 hours.  CBG monitoring, ED     Status: Abnormal   Collection Time: 02/09/22  4:19 PM  Result Value Ref Range   Glucose-Capillary 209 (H) 70 - 99 mg/dL    Comment: Glucose reference range applies only to samples taken after fasting for at least 8 hours.   Comment 1 Notify RN    Comment 2 Document in Chart    CT Angio Chest PE W and/or Wo Contrast  Result Date: 02/08/2022 CLINICAL DATA:  Tachycardia, shortness of breath, chest pain EXAM: CT ANGIOGRAPHY CHEST WITH CONTRAST TECHNIQUE: Multidetector CT imaging of the chest was performed using the standard protocol during bolus administration of intravenous contrast. Multiplanar CT image reconstructions and MIPs were obtained to evaluate the vascular anatomy. RADIATION DOSE REDUCTION: This exam was performed according to the departmental dose-optimization program which includes automated exposure control, adjustment of the mA and/or kV according to patient size and/or use of iterative reconstruction technique. CONTRAST:  1m OMNIPAQUE  IOHEXOL 350 MG/ML SOLN COMPARISON:  06/06/2018 FINDINGS: Cardiovascular: There is homogeneous enhancement in thoracic aorta. There are no intraluminal filling defect in central pulmonary artery branches. Evaluation of small peripheral branches is limited by motion artifact. Scattered coronary artery calcifications are seen. Mediastinum/Nodes: There are enlarged lymph nodes in mediastinum and both hilar regions with no significant interval change. Lungs/Pleura: Breathing motion limits evaluation of lung fields. As far as seen, there is no focal pulmonary consolidation. There is narrowing of AP diameter of the trachea and main bronchi, possibly suggesting bronchospasm. There is no pleural effusion or pneumothorax. Upper Abdomen: There  is fatty infiltration in liver. There is minimal nodularity of liver surface. There is possible hyperplasia in left adrenal. Musculoskeletal: Degenerative changes are noted in thoracic spine with bony spurs. Review of the MIP images confirms the above findings. IMPRESSION: There is no evidence of central pulmonary artery embolism. Evaluation of small peripheral pulmonary artery branches is limited by motion artifact. There is no evidence of thoracic aortic dissection. There is no focal pulmonary consolidation. Coronary artery calcifications are seen. Fatty liver. There is minimal nodularity of the liver surface suggesting possible cirrhosis. Electronically Signed   By: Elmer Picker M.D.   On: 02/08/2022 19:29   VAS [place] LOWER EXTREMITY VENOUS (DVT) (ONLY [facility] & [facility])  Result Date: 02/08/2022  Lower Venous DVT Study Patient Name:  Shelby Morgan  Date of Exam:   02/08/2022 Medical Rec #: [id number]       Accession #:    [id number] Date of Birth: 1955/10/20       Patient Gender: F Patient Age:   81 years Exam Location:  [facility] Procedure:      VAS [place] LOWER EXTREMITY VENOUS (DVT) Referring Phys: Marda Stalker --------------------------------------------------------------------------------  Indications: Bilateral lower extremity pitting edema and foot pain.  Risk Factors: Immobility (patient was in in patient rehab until last week) Recent stroke affecting left side. Limitations: Edema, patient movement. Comparison Study: Prior negative right LEV done 05/02/12 Performing Technologist: Sharion Dove RVS  Examination Guidelines: A complete evaluation includes B-mode imaging, spectral Doppler, color Doppler, and power Doppler as needed of all accessible portions of each vessel. Bilateral testing is considered an integral part of a complete examination. Limited examinations for reoccurring indications may be performed as noted. The reflux portion of the exam is performed with the patient in reverse  Trendelenburg.  +---------+---------------+---------+-----------+----------+--------------+ RIGHT    CompressibilityPhasicitySpontaneityPropertiesThrombus Aging +---------+---------------+---------+-----------+----------+--------------+ CFV      Full           Yes      Yes                                 +---------+---------------+---------+-----------+----------+--------------+ SFJ      Full                                                        +---------+---------------+---------+-----------+----------+--------------+ FV Prox  Full                                                        +---------+---------------+---------+-----------+----------+--------------+ FV Mid  Full                                                        +---------+---------------+---------+-----------+----------+--------------+ FV DistalFull                                                        +---------+---------------+---------+-----------+----------+--------------+ PFV      Full                                                        +---------+---------------+---------+-----------+----------+--------------+ POP      Full           Yes      Yes                                 +---------+---------------+---------+-----------+----------+--------------+ PTV      Full                                                        +---------+---------------+---------+-----------+----------+--------------+ PERO     Full                                                        +---------+---------------+---------+-----------+----------+--------------+   +---------+---------------+---------+-----------+----------+--------------+ LEFT     CompressibilityPhasicitySpontaneityPropertiesThrombus Aging +---------+---------------+---------+-----------+----------+--------------+ CFV      Full           Yes      Yes                                  +---------+---------------+---------+-----------+----------+--------------+ SFJ      Full                                                        +---------+---------------+---------+-----------+----------+--------------+ FV Prox  Full                                                        +---------+---------------+---------+-----------+----------+--------------+ FV Mid   Full                                                        +---------+---------------+---------+-----------+----------+--------------+  FV DistalFull                                                        +---------+---------------+---------+-----------+----------+--------------+ PFV      Full                                                        +---------+---------------+---------+-----------+----------+--------------+ POP      Full           Yes      Yes                                 +---------+---------------+---------+-----------+----------+--------------+ PTV      Full                                                        +---------+---------------+---------+-----------+----------+--------------+ PERO     Partial        No       No                   Chronic        +---------+---------------+---------+-----------+----------+--------------+ Small segment of probable chronic DVT noted in one of the paired peroneal veins mid calf.    Summary: BILATERAL: -No evidence of popliteal cyst, bilaterally. RIGHT: - There is no evidence of deep vein thrombosis in the lower extremity.  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 - Small segment of probable chronic DVT noted in one of the paired peroneal veins mid calf.  *See table(s) above for measurements and observations. Electronically signed by Harold Barban MD on 02/08/2022 at 7:16:56 PM.    Final    DG Foot Complete Left  Result Date: 02/08/2022 CLINICAL DATA:  Foot pain, redness and  swelling. EXAM: LEFT FOOT - COMPLETE 3+ VIEW COMPARISON:  01/20/2022 FINDINGS: No acute fracture. Stable alignment. Moderate midfoot dorsal spurring. Mild tibial talar spurring. Large plantar calcaneal spur and Achilles tendon enthesophyte. No erosion or bone destruction. Generalized soft tissue edema similar to prior exam. Prominent vascular calcifications. No soft tissue gas or radiopaque foreign body. IMPRESSION: 1. Generalized soft tissue edema similar to prior exam. No soft tissue gas or radiopaque foreign body. 2. No acute osseous findings. Midfoot and ankle joint osteoarthritis. 3. Plantar calcaneal spur and Achilles tendon enthesophyte. Electronically Signed   By: Keith Rake M.D.   On: 02/08/2022 17:45   DG Foot Complete Right  Result Date: 02/08/2022 CLINICAL DATA:  Foot pain, redness and swelling. EXAM: RIGHT FOOT COMPLETE - 3+ VIEW COMPARISON:  Radiograph 01/19/2022 FINDINGS: Medial malleolar and distal fibular hardware partially visualized. Possible lucency about the medial malleolar screw, seen only on the oblique view. Degenerative spurring of the midfoot. Tibial talar osteoarthritis. Findings are similar to prior exam. Unchanged plantar calcaneal spur and Achilles tendon enthesophyte. No fracture. No erosive change or bony destruction. Generalized soft tissue edema, similar. No soft tissue  gas or radiopaque foreign body. IMPRESSION: 1. Generalized soft tissue edema, similar to prior. No soft tissue gas or radiopaque foreign body. 2. 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 No acute osseous findings. 3. Surgical hardware in the distal tibia and fibula. Possible but not definite lucency about medial malleolar screw which may represent loosening or infection. Electronically Signed   By: Keith Rake M.D.   On: 02/08/2022 17:43   DG Chest 2 View  Result Date: 02/08/2022 CLINICAL DATA:  Shortness of breath EXAM: CHEST - 2 VIEW COMPARISON:  06/06/2018 FINDINGS: The heart size  and mediastinal contours are within normal limits. No focal airspace consolidation, pleural effusion, or pneumothorax. The visualized skeletal structures are unremarkable. IMPRESSION: No active cardiopulmonary disease. Electronically Signed   By: Davina Poke D.O.   On: 02/08/2022 11:49    Pending Labs Unresulted Labs (From admission, onward)     Start     Ordered   02/10/22 0109  Basic metabolic panel  Tomorrow morning,   R        02/09/22 1741   02/10/22 0500  CBC  Tomorrow morning,   R        02/09/22 1741            Vitals/Pain Today's Vitals   02/09/22 1345 02/09/22 1500 02/09/22 1600 02/09/22 1645  BP: 127/76 (!) 135/107 108/62 (!) 164/58  Pulse: 94 95 92 86  Resp: 19 19 (!) 25 (!) 22  Temp:      TempSrc:      SpO2: 97% 98% 96% 98%  Weight:      Height:      PainSc:        Isolation Precautions No active isolations  Medications Medications  traMADol (ULTRAM) tablet 50 mg (has no administration in time range)  rosuvastatin (CRESTOR) tablet 20 mg (20 mg Oral Given 02/09/22 0935)  busPIRone (BUSPAR) tablet 5 mg (5 mg Oral Given 02/09/22 0936)  traZODone (DESYREL) tablet 50 mg (has no administration in time range)  pantoprazole (PROTONIX) EC tablet 40 mg (40 mg Oral Given 02/09/22 0935)  gabapentin (NEURONTIN) capsule 100 mg (100 mg Oral Given 02/09/22 1616)  polyvinyl alcohol (LIQUIFILM TEARS) 1.4 % ophthalmic solution 1 drop (has no administration in time range)  sodium chloride flush (NS) 0.9 % injection 3 mL (3 mLs Intravenous Given 02/09/22 0935)  acetaminophen (TYLENOL) tablet 650 mg (has no administration in time range)    Or  acetaminophen (TYLENOL) suppository 650 mg (has no administration in time range)  polyethylene glycol (MIRALAX / GLYCOLAX) packet 17 g (has no administration in time range)  insulin aspart (novoLOG) injection 0-15 Units (5 Units Subcutaneous Given 02/09/22 1632)  apixaban (ELIQUIS) tablet 10 mg (10 mg Oral Given 02/09/22 0935)    Followed  by  apixaban (ELIQUIS) tablet 5 mg (has no administration in time range)  predniSONE (DELTASONE) tablet 40 mg (40 mg Oral Given 02/09/22 0117)  lactated ringers infusion ( Intravenous New Bag/Given 02/09/22 0829)  insulin aspart (novoLOG) injection 3 Units (3 Units Subcutaneous Not Given 02/09/22 1632)  insulin glargine-yfgn (SEMGLEE) injection 5 Units (has no administration in time range)  fentaNYL (SUBLIMAZE) injection 50 mcg (50 mcg Intravenous Given 02/08/22 1739)  iohexol (OMNIPAQUE) 350 MG/ML injection 65 mL (65 mLs Intravenous Contrast Given 02/08/22 1911)  potassium chloride (KLOR-CON) packet 40 mEq (40 mEq Oral Given 02/08/22 2341)    Mobility non-ambulatory Low fall risk      R Recommendations: See Admitting Provider Note

## 2022-02-09 NOTE — Telephone Encounter (Signed)
Pharmacy Patient Advocate Encounter  Insurance verification completed.    The patient is insured through [employer] Medicare Part D   The patient is currently admitted and ran test claims for the following: Eliquis .  Copays and coinsurance results were relayed to Inpatient clinical team.  

## 2022-02-10 DIAGNOSIS — I82402 Acute embolism and thrombosis of unspecified deep veins of left lower extremity: Secondary | ICD-10-CM | POA: Diagnosis not present

## 2022-02-10 LAB — BASIC METABOLIC PANEL
Anion gap: 13 (ref 5–15)
BUN: 16 mg/dL (ref 8–23)
CO2: 24 mmol/L (ref 22–32)
Calcium: 9.1 mg/dL (ref 8.9–10.3)
Chloride: 102 mmol/L (ref 98–111)
Creatinine, Ser: 1.07 mg/dL — ABNORMAL HIGH (ref 0.44–1.00)
GFR, Estimated: 58 mL/min — ABNORMAL LOW (ref >60)
Glucose, Bld: 147 mg/dL — ABNORMAL HIGH (ref 70–99)
Potassium: 3.8 mmol/L (ref 3.5–5.1)
Sodium: 139 mmol/L (ref 135–145)

## 2022-02-10 LAB — CBC
HCT: 31 % — ABNORMAL LOW (ref 36.0–46.0)
Hemoglobin: 10.2 g/dL — ABNORMAL LOW (ref 12.0–15.0)
MCH: 27.4 pg (ref 26.0–34.0)
MCHC: 32.9 g/dL (ref 30.0–36.0)
MCV: 83.3 fL (ref 80.0–100.0)
Platelets: 303 10*3/uL (ref 150–400)
RBC: 3.72 MIL/uL — ABNORMAL LOW (ref 3.87–5.11)
RDW: 13.4 % (ref 11.5–15.5)
WBC: 9.1 10*3/uL (ref 4.0–10.5)
nRBC: 0 % (ref 0.0–0.2)

## 2022-02-10 LAB — GLUCOSE, CAPILLARY
Glucose-Capillary: 178 mg/dL — ABNORMAL HIGH (ref 70–99)
Glucose-Capillary: 206 mg/dL — ABNORMAL HIGH (ref 70–99)
Glucose-Capillary: 204 mg/dL — ABNORMAL HIGH (ref 70–99)
Glucose-Capillary: 289 mg/dL — ABNORMAL HIGH (ref 70–99)
Glucose-Capillary: 212 mg/dL — ABNORMAL HIGH (ref 70–99)

## 2022-02-10 MED ORDER — LABETALOL HCL 5 MG/ML IV SOLN
5.0000 mg | Freq: Once | INTRAVENOUS | Status: AC
Start: 2022-02-10 — End: 2022-02-10
  Administered 2022-02-10: 5 mg via INTRAVENOUS
  Filled 2022-02-10: qty 4

## 2022-02-10 MED ORDER — FUROSEMIDE 20 MG PO TABS
20.0000 mg | ORAL_TABLET | Freq: Every day | ORAL | Status: DC
  Administered 2022-02-11 – 2022-02-16 (×5): 20 mg via ORAL
  Filled 2022-02-10 (×6): qty 1

## 2022-02-10 MED ORDER — FUROSEMIDE 10 MG/ML IJ SOLN
20.0000 mg | Freq: Once | INTRAMUSCULAR | Status: AC
  Administered 2022-02-10: 20 mg via INTRAVENOUS
  Filled 2022-02-10: qty 2

## 2022-02-10 MED ORDER — PREDNISONE 20 MG PO TABS: 40.0000 mg | ORAL_TABLET | Freq: Every day | ORAL | 0 refills | Status: DC

## 2022-02-10 MED ORDER — APIXABAN 5 MG PO TABS: 10.0000 mg | ORAL_TABLET | Freq: Two times a day (BID) | ORAL | 0 refills | Status: DC

## 2022-02-10 MED ORDER — APIXABAN 5 MG PO TABS: 5.0000 mg | ORAL_TABLET | Freq: Two times a day (BID) | ORAL | 0 refills | Status: DC

## 2022-02-10 NOTE — TOC Initial Note (Signed)
Transition of Care (TOC) - Initial/Assessment Note  [name] RN, BSN Transitions of Care Unit 4E- RN Case Manager See Treatment Team for direct phone #    Patient Details  Name: Shelby Morgan MRN: [id number] Date of Birth: 1955-10-19  Transition of Care [facility]) CM/SW Contact:    Dawayne Patricia, RN Phone Number: 02/10/2022, 3:05 PM  Clinical Narrative:                 Pt admitted w/ DVT, hx of recent stroke s/p stay at [place] rehab.  Pt stable for discharge medically however CM has received msg from multiple family members regarding concerns about pt returning to home environment.  Orders have been placed for [facility] per recs for therapy here.   CM in to speak with pt at bedside- Pt is alert and oriented. Per pt she would like to return to her home/apartment. She reports she lives alone and has been trying to get friends/neighbors that can assist her in the home however will not have 24/7 assistance. Pt states she went home w/ her 65 yr old mother after rehab however her mother can not physically assist her and she is unable at this time to move around on her own. Discussed HH with pt and explained it would not be someone in the home to assist w/ cooking or cleaning only for therapy needs a couple of visits a week for about an hour each visit. Pt voiced understanding- she then voiced that her son was very concerned and wanting her to come to [place] where he lives- explained that if she were to do that- she would need to establish a doctor there in order to get [facility] or do outpt therapy- orders from here would not carry over to another state.  Pt requested to call her son while this CM was in the room to discuss. TC made to [place]- pt's son- and discussed above in review w/ Lennette Bihari as well as his concerns about pt being at home alone and unable to do for herself. In further discussion pt admitted she was unable to get to [facility] or Bathroom on her own- without assistance at this time and would not  really be safe at home alone at this time or at her mother's home. Pt agreeable to see if insurance will approve a STSNF stay in order for her to gain strength with goal to be able to return home w/ intermittent assistance that she and her son could arrange w/ friends and neighbors or come up with another plan.  CM has explained to both pt and son that STSNF is dependent on finding a bed and insurance approval for SNF rehab stay.   MD updated, as well as msg sent to PT/OT to see if they agree with upated SNF plan since pt does not have needed assistance at home.  CSW also updated on need for SNF bed search.      Expected Discharge Plan: [place] Barriers to Discharge: Unsafe home situation, SNF Pending bed offer   Patient Goals and CMS Choice Patient states their goals for this hospitalization and ongoing recovery are:: wants to return home when able and stronger CMS Medicare.gov Compare Post Acute Care list provided to:: Patient Choice offered to / list presented to : Patient, Adult Children  Expected Discharge Plan and Services Expected Discharge Plan: [place] In-house Referral: Clinical Social Work Discharge Planning Services: CM Consult Post Acute Care Choice:  Home Health, [place] Living arrangements for the past 2 months: Single Family Home (was staying w/ 65yrold mother, however lives alone in apt) Expected Discharge Date: 02/10/22               DME Arranged: 3-N-1, Wheelchair manual                    Prior Living Arrangements/Services Living arrangements for the past 2 months: Single Family Home (was staying w/ 65yrold mother, however lives alone in apt) Lives with:: Self Patient language and need for interpreter reviewed:: Yes Do you feel safe going back to the place where you live?: No   does not feel safe to return home w/ 65yrold mother who is unable to physically assist, lives home alone and unable to care  for self at this time- does not have 24/7 assistance  Need for Family Participation in Patient Care: Yes (Comment) Care giver support system in place?: No (comment) Current home services: DME (RW, borrowed rollator) Criminal Activity/Legal Involvement Pertinent to Current Situation/Hospitalization: No - Comment as needed  Activities of Daily Living Home Assistive Devices/Equipment: Wheelchair ADL Screening (condition at time of admission) Patient's cognitive ability adequate to safely complete daily activities?: Yes Is the patient deaf or have difficulty hearing?: No Does the patient have difficulty seeing, even when wearing glasses/contacts?: No Does the patient have difficulty concentrating, remembering, or making decisions?: No Patient able to express need for assistance with ADLs?: Yes Does the patient have difficulty dressing or bathing?: Yes Independently performs ADLs?: No Communication: Independent Dressing (OT): Needs assistance Is this a change from baseline?: Pre-admission baseline Grooming: Independent Feeding: Independent Bathing: Independent with device (comment), Needs assistance (uses chair for shower) Is this a change from baseline?: Pre-admission baseline Toileting: Independent with device (comment) (uses wheelchair) In/Out Bed: Independent with device (comment) Walks in Home: Independent with device (comment) (wheelchair) Does the patient have difficulty walking or climbing stairs?: Yes Weakness of Legs: Left Weakness of Arms/Hands: Left  Permission Sought/Granted Permission sought to share information with : Case Manager Permission granted to share information with : Yes, Verbal Permission Granted  Share Information with NAME: KevLennette Bihariermission granted to share info w AGENCY: SNF  Permission granted to share info w Relationship: son     Emotional Assessment Appearance:: Appears stated age Attitude/Demeanor/Rapport: Engaged Affect (typically observed):  Accepting, Appropriate Orientation: : Oriented to Self, Oriented to Place, Oriented to  Time, Oriented to Situation Alcohol / Substance Use: Not Applicable Psych Involvement: No (comment)  Admission diagnosis:  Peripheral edema [R60.9] DVT (deep venous thrombosis) (HCC) [I82.409] Exertional shortness of breath [R06.02] Deep venous thrombosis (DVT) of left peroneal vein, unspecified chronicity (HCC) [I82.452] Patient Active Problem List   Diagnosis Date Noted   DVT (deep venous thrombosis) (HCC) 02/08/2022   Elevated serum creatinine 02/08/2022   Parotid nodule--left 01/29/2022   Generalized anxiety disorder 01/29/2022   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 01/29/2022   Gout flare 01/29/2022   Leucocytosis--reactive due to gout/steriods 01/29/2022   AKI (acute kidney injury) ([place]  History of CVA (cerebrovascular accident) 04/10/2021   History of melanoma 06/20/2018   Chest pain 06/06/2018   Primary hypertension 06/06/2018   Hyperlipidemia associated with type 2 diabetes mellitus ([place]CC[place]1/05/2018   Mediastinal adenopathy 06/06/2018   Elevated LFTs 05/02/2012   Lactic acidosis 05/02/2012   Cellulitis of right leg 05/01/2012   Diabetes mellitus type 2 in obese (HCCDarrt[place]0/12/2011   Hypokalemia 05/01/2012  Hyponatremia 05/01/2012   NAFLD (nonalcoholic fatty liver disease) 05/14/2011   Obesity 05/14/2011   Hemorrhoids 05/14/2011   Transaminitis 01/29/2011   Hepatomegaly 01/29/2011   Rectal bleed 01/29/2011   Dysphagia 01/29/2011   RUQ pain 01/29/2011   Hypercalcemia 01/29/2011   Anxiety 01/29/2011   GERD (gastroesophageal reflux disease) 01/29/2011   PCP:  Cory Munch, PA-C Pharmacy:   [id number] [place] [address]Phone: [phone]: [phone]    Social Determinants of Health (SDOH) Interventions    Readmission Risk Interventions     No data to display

## 2022-02-10 NOTE — Social Work (Addendum)
                                                                                                                          Re: Shelby Morgan Date of Birth: 11/08/2020 Date: 02/10/2022  To Whom It May Concern:  Please be advised that the above-named patient will require a short-term nursing home stay-anticipated 30 days or less for rehabilitation and strengthening. The plan is to return home.

## 2022-02-10 NOTE — Plan of Care (Signed)

## 2022-02-10 NOTE — Plan of Care (Signed)

## 2022-02-10 NOTE — Progress Notes (Addendum)
Progress Note   Patient: Shelby Morgan:[id number] DOB: 05/15/1956 DOA: 02/08/2022     0 DOS: the patient was seen and examined on 02/10/2022   Brief hospital course: 66 year old female with PMH of HTN, HL, DM2, 03/2021 and 12/2021 CVAs (on Plavix) who presented to the ED on 7/16 with left leg pain found to have a DVT in the left peroneal vein, left foot gout, and a mild AKI.  CTA of the chest showed no large PE but had limited evaluation of the distal arteries.  Patient's discharge is pending SNF placement.  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  There is some evidence that chronic DVTs of the distal veins can be treated if symptomatic.  Given patient's new difficulty with mobility and ADLs and low bleeding risk - we will continue treatment.    - Continue Eliquis (10 mg BID for 7 days until 7/22 followed by 5 mg BID starting 7/23 for 3-6 months).  Acute on Chronic Gout of the left foot: - This is Day 3 out of 5 of steroids.  Mild Bilateral Lower Extremity Edema: Patient continues to complain of swelling in her feet and is requesting Lasix. Xray does report soft tissue edema.  The edema is more likely related to her ankle injury and new DVT.   - I will give IV Lasix 20 mg x once followed by home dose of Lasix 20 mg daily.  AKI on CKD2, resolved: - Creatinine is down to 1 mg/dL consistent with baseline.  Diabetes Mellitus Type 2: Glucose is stable. - Continue Lantus 5 units nightly and 3 units pre-meal insulin. - Continue SSI with POC glucose ACHS.  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 - Continue Statin.  Hypertension: BP is stable.  Hyperlipidemia: - Statin.  GERD: - PPI  OSA: - Recommend CPAP nightly.  Physical Deconditioning: Patient uses walker at baseline. -  PT/OT recommends home with [facility] PT.  However, per patient's family, Shelby Morgan is unable to take care of herself at home.  Shelby Morgan and family agree to pursue SNF options. This was discussed with Case Management who will follow up tomorrow.   Appreciate assistance.  Right Ankle Surgery Morbid Obesity: BMI 39.92 kg/m2 Fatty Liver Disease 2012 GI bleed 2012 Bell's Palsy 2009 Melanoma, Stage, had surgery  Subjective: Shelby Morgan is lying in bed comfortably.   She denies any chest pain or shortness of breath. Respiratory status is stable. She tells me her feet hurt when she walks on them. She is a little confused about her medications - I clarified which medications she is on to the best of my ability. I spoke with her son over the phone regarding patient's inability to take care of herself at home. Patient and family are in agreement to pursue SNF options.  Physical Exam: Vitals:   02/09/22 2032 02/09/22 2306 02/10/22 0603 02/10/22 0604  BP: (!) 122/91 (!) 146/79 130/71   Pulse: 86 82 82 81  Resp: 18 18 (!) 21 20  Temp: 98.5 F (36.9 C) 98.4 F (36.9 C) 98.5 F (36.9 C)   TempSrc: Oral Oral Oral   SpO2: 99% 97% 94% 97%  Weight: 99.8 kg     Height: '5\' 2"'$  (1.575 m)      Examination: General  exam: chronically ill appearing, obese, endorses left leg pain. HEENT: NCAT, PERRL Respiratory system: CTAB no WRR Cardiovascular system: Did not appreciate a murmur, regular, No JVD. Gastrointestinal system: No flank pain, Abdomen soft, NT,ND, BS+. Nervous System: No focal deficits. Extremities: left foot tenderness and swelling, distal peripheral pulses palpable.  Skin: No rashes, No bruises, No icterus. MSK: Physical Deconditioning  Data Reviewed: CT Angio Chest PE W and/or Wo Contrast CLINICAL DATA:  Tachycardia, shortness of breath, chest pain  EXAM: CT ANGIOGRAPHY CHEST WITH CONTRAST  TECHNIQUE: Multidetector CT imaging of the chest was performed using the standard protocol during  bolus administration of intravenous contrast. Multiplanar CT image reconstructions and MIPs were obtained to evaluate the vascular anatomy.  RADIATION DOSE REDUCTION: This exam was performed according to the departmental dose-optimization program which includes automated exposure control, adjustment of the mA and/or kV according to patient size and/or use of iterative reconstruction technique.  CONTRAST:  39m OMNIPAQUE IOHEXOL 350 MG/ML SOLN  COMPARISON:  06/06/2018  FINDINGS: Cardiovascular: There is homogeneous enhancement in thoracic aorta. There are no intraluminal filling defect in central pulmonary artery branches. Evaluation of small peripheral branches is limited by motion artifact. Scattered coronary artery calcifications are seen.  Mediastinum/Nodes: There are enlarged lymph nodes in mediastinum and both hilar regions with no significant interval change.  Lungs/Pleura: Breathing motion limits evaluation of lung fields. As far as seen, there is no focal pulmonary consolidation. There is narrowing of AP diameter of the trachea and main bronchi, possibly suggesting bronchospasm. There is no pleural effusion or pneumothorax.  Upper Abdomen: There is fatty infiltration in liver. There is minimal nodularity of liver surface. There is possible hyperplasia in left adrenal.  Musculoskeletal: Degenerative changes are noted in thoracic spine with bony spurs.  Review of the MIP images confirms the above findings.  IMPRESSION: There is no evidence of central pulmonary artery embolism. Evaluation of small peripheral pulmonary artery branches is limited by motion artifact. There is no evidence of thoracic aortic dissection. There is no focal pulmonary consolidation.  Coronary artery calcifications are seen. Fatty liver. There is minimal nodularity of the liver surface suggesting possible cirrhosis.  Electronically Signed   By: PElmer PickerM.D.   On: 02/08/2022  19:29 VAS [place]OWER EXTREMITY VENOUS (DVT) (ONLY [facility] & [facility])  Lower Venous DVT Study  Patient Name:  Shelby Morgan Date of Exam:   02/08/2022 Medical Rec #: [id number]      Accession #:    [id number] of Birth: [birth year]      Patient Gender: F Patient Age:   89years [facility]      VAS [place]OWER EXTREMITY VENOUS (DVT) Referring Phys: CMarda Stalker --------------------------------------------------------------------------------   Indications: Bilateral lower extremity pitting edema and foot pain.   Risk Factors: Immobility (patient was in in patient rehab until last week) Recent stroke affecting left side. Limitations: Edema, patient movement. Comparison Study: Prior negative right LEV done 05/02/12  Performing Technologist: CSharion DoveRVS    Examination Guidelines: A complete evaluation includes B-mode imaging, spectral Doppler, color Doppler, and power Doppler as needed of all accessible portions of each vessel. Bilateral testing is considered an integral part of a complete examination. Limited examinations for reoccurring indications may be performed as noted. The reflux portion of the exam is performed with the patient in reverse Trendelenburg.     +---------+---------------+---------+-----------+----------+--------------+ RIGHT    CompressibilityPhasicitySpontaneityPropertiesThrombus Aging +---------+---------------+---------+-----------+----------+--------------+ CFV  Full           Yes      Yes                                 +---------+---------------+---------+-----------+----------+--------------+ SFJ      Full                                                        +---------+---------------+---------+-----------+----------+--------------+ FV Prox  Full                                                        +---------+---------------+---------+-----------+----------+--------------+ FV  Mid   Full                                                        +---------+---------------+---------+-----------+----------+--------------+ FV DistalFull                                                        +---------+---------------+---------+-----------+----------+--------------+ PFV      Full                                                        +---------+---------------+---------+-----------+----------+--------------+ POP      Full           Yes      Yes                                 +---------+---------------+---------+-----------+----------+--------------+ PTV      Full                                                        +---------+---------------+---------+-----------+----------+--------------+ PERO     Full                                                        +---------+---------------+---------+-----------+----------+--------------+        +---------+---------------+---------+-----------+----------+--------------+ LEFT     CompressibilityPhasicitySpontaneityPropertiesThrombus Aging +---------+---------------+---------+-----------+----------+--------------+ CFV      Full           Yes      Yes                                 +---------+---------------+---------+-----------+----------+--------------+  SFJ      Full                                                        +---------+---------------+---------+-----------+----------+--------------+ FV Prox  Full                                                        +---------+---------------+---------+-----------+----------+--------------+ FV Mid   Full                                                        +---------+---------------+---------+-----------+----------+--------------+ FV DistalFull                                                        +---------+---------------+---------+-----------+----------+--------------+ PFV      Full                                                         +---------+---------------+---------+-----------+----------+--------------+ POP      Full           Yes      Yes                                 +---------+---------------+---------+-----------+----------+--------------+ PTV      Full                                                        +---------+---------------+---------+-----------+----------+--------------+ PERO     Partial        No       No                   Chronic        +---------+---------------+---------+-----------+----------+--------------+  Small segment of probable chronic DVT noted in one of the paired peroneal veins mid calf.          Summary: BILATERAL: -No evidence of popliteal cyst, bilaterally. RIGHT: - There is no evidence of deep vein thrombosis in the lower extremity.   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 - Small segment of probable chronic DVT noted in one of the paired peroneal veins mid calf.    *See table(s) above for measurements and observations.  Electronically signed by Harold Barban MD on 02/08/2022 at 7:16:56 PM.      Final   DG Foot Complete Left CLINICAL DATA:  Foot pain, redness and swelling.  EXAM: LEFT  FOOT - COMPLETE 3+ VIEW  COMPARISON:  01/20/2022  FINDINGS: No acute fracture. Stable alignment. Moderate midfoot dorsal spurring. Mild tibial talar spurring. Large plantar calcaneal spur and Achilles tendon enthesophyte. No erosion or bone destruction. Generalized soft tissue edema similar to prior exam. Prominent vascular calcifications. No soft tissue gas or radiopaque foreign body.  IMPRESSION: 1. Generalized soft tissue edema similar to prior exam. No soft tissue gas or radiopaque foreign body. 2. No acute osseous findings. Midfoot and ankle joint osteoarthritis. 3. Plantar calcaneal spur and Achilles tendon  enthesophyte.  Electronically Signed   By: Keith Rake M.D.   On: 02/08/2022 17:45 DG Foot Complete Right CLINICAL DATA:  Foot pain, redness and swelling.  EXAM: RIGHT FOOT COMPLETE - 3+ VIEW  COMPARISON:  Radiograph 01/19/2022  FINDINGS: Medial malleolar and distal fibular hardware partially visualized. Possible lucency about the medial malleolar screw, seen only on the oblique view. Degenerative spurring of the midfoot. Tibial talar osteoarthritis. Findings are similar to prior exam. Unchanged plantar calcaneal spur and Achilles tendon enthesophyte. No fracture. No erosive change or bony destruction. Generalized soft tissue edema, similar. No soft tissue gas or radiopaque foreign body.  IMPRESSION: 1. Generalized soft tissue edema, similar to prior. No soft tissue gas or radiopaque foreign body. 2. 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 No acute osseous findings. 3. Surgical hardware in the distal tibia and fibula. Possible but not definite lucency about medial malleolar screw which may represent loosening or infection.  Electronically Signed   By: Keith Rake M.D.   On: 02/08/2022 17:43 DG Chest 2 View CLINICAL DATA:  Shortness of breath  EXAM: CHEST - 2 VIEW  COMPARISON:  06/06/2018  FINDINGS: The heart size and mediastinal contours are within normal limits. No focal airspace consolidation, pleural effusion, or pneumothorax. The visualized skeletal structures are unremarkable.  IMPRESSION: No active cardiopulmonary disease.  Electronically Signed   By: Davina Poke D.O.   On: 02/08/2022 11:49    Family Communication: I spoke with son over the phone.  All of his questions were answered.  Disposition: Status is:  The patient will require care spanning > 2 midnights and should be moved to inpatient because: need for monitoring.  We are now looking for a SNF placement due to patient's inability to take care of herself at  home.  Planned Discharge Destination: pending placement Skilled nursing facility DVT prophylaxis: None.  On Eliquis.  Time spent: >30 minutes  Author: George Hugh, MD 02/10/2022 7:55 AM  For on call review [email or web address].

## 2022-02-10 NOTE — Progress Notes (Signed)
Mobility Specialist: Progress Note   02/10/22 1434  Mobility  Activity Ambulated with assistance in hallway  Level of Assistance Contact guard assist, steadying assist  Assistive Device Front wheel walker  LLE Weight Bearing WBAT  Distance Ambulated (ft) 230 ft (80'+150')  Activity Response Tolerated well  $Mobility charge 1 Mobility   During Mobility: 120 HR Post-Mobility: 111 HR, 175/91(113) BP, 99% SpO2  Pt received in the chair and agreeable to mobility. Stopped x1 for seated break secondary to R foot pain and general fatigue. Pt back to the chair after session with call bell at her side and family present in the room.   [facility] Hamzeh Tall Mobility Specialist Mobility Specialist [address]: [phone]

## 2022-02-10 NOTE — Plan of Care (Signed)
  Problem: Education: Goal: Ability to describe self-care measures that may prevent or decrease complications (Diabetes Survival Skills Education) will improve 02/10/2022 0005 by Alvira Monday, RN Outcome: Progressing 02/10/2022 0005 by Alvira Monday, RN Outcome: Progressing Goal: Individualized Educational Video(s) 02/10/2022 0005 by Alvira Monday, RN Outcome: Progressing 02/10/2022 0005 by Alvira Monday, RN Outcome: Progressing   Problem: Coping: Goal: Ability to adjust to condition or change in health will improve 02/10/2022 0005 by Alvira Monday, RN Outcome: Progressing 02/10/2022 0005 by Alvira Monday, RN Outcome: Progressing   Problem: Fluid Volume: Goal: Ability to maintain a balanced intake and output will improve 02/10/2022 0005 by Alvira Monday, RN Outcome: Progressing 02/10/2022 0005 by Alvira Monday, RN Outcome: Progressing   Problem: Health Behavior/Discharge Planning: Goal: Ability to identify and utilize available resources and services will improve 02/10/2022 0005 by Alvira Monday, RN Outcome: Progressing 02/10/2022 0005 by Alvira Monday, RN Outcome: Progressing Goal: Ability to manage health-related needs will improve 02/10/2022 0005 by Alvira Monday, RN Outcome: Progressing 02/10/2022 0005 by Alvira Monday, RN Outcome: Progressing   Problem: Metabolic: Goal: Ability to maintain appropriate glucose levels will improve 02/10/2022 0005 by Alvira Monday, RN Outcome: Progressing 02/10/2022 0005 by Alvira Monday, RN Outcome: Progressing   Problem: Nutritional: Goal: Maintenance of adequate nutrition will improve 02/10/2022 0005 by Alvira Monday, RN Outcome: Progressing 02/10/2022 0005 by Alvira Monday, RN Outcome: Progressing Goal: Progress toward achieving an optimal weight will improve 02/10/2022 0005 by Alvira Monday, RN Outcome: Progressing 02/10/2022 0005 by Alvira Monday, RN Outcome:  Progressing   Problem: Skin Integrity: Goal: Risk for impaired skin integrity will decrease 02/10/2022 0005 by Alvira Monday, RN Outcome: Progressing 02/10/2022 0005 by Alvira Monday, RN Outcome: Progressing   Problem: Tissue Perfusion: Goal: Adequacy of tissue perfusion will improve 02/10/2022 0005 by Alvira Monday, RN Outcome: Progressing 02/10/2022 0005 by Alvira Monday, RN Outcome: Progressing   Problem: Education: Goal: Knowledge of General Education information will improve Description: Including pain rating scale, medication(s)/side effects and non-pharmacologic comfort measures Outcome: Progressing   Problem: Health Behavior/Discharge Planning: Goal: Ability to manage health-related needs will improve Outcome: Progressing   Problem: Clinical Measurements: Goal: Ability to maintain clinical measurements within normal limits will improve Outcome: Progressing Goal: Will remain free from infection Outcome: Progressing Goal: Diagnostic test results will improve Outcome: Progressing Goal: Respiratory complications will improve Outcome: Progressing Goal: Cardiovascular complication will be avoided Outcome: Progressing   Problem: Activity: Goal: Risk for activity intolerance will decrease Outcome: Progressing   Problem: Nutrition: Goal: Adequate nutrition will be maintained Outcome: Progressing   Problem: Coping: Goal: Level of anxiety will decrease Outcome: Progressing   Problem: Elimination: Goal: Will not experience complications related to bowel motility Outcome: Progressing Goal: Will not experience complications related to urinary retention Outcome: Progressing   Problem: Pain Managment: Goal: General experience of comfort will improve Outcome: Progressing   Problem: Safety: Goal: Ability to remain free from injury will improve Outcome: Progressing   Problem: Skin Integrity: Goal: Risk for impaired skin integrity will  decrease Outcome: Progressing

## 2022-02-10 NOTE — Progress Notes (Signed)
PT Cancellation Note  Patient Details Name: Shelby Morgan MRN: [id number] DOB: Mar 24, 1956   Cancelled Treatment:    Reason Eval/Treat Not Completed: Other (comment) (pt just received breakfast tray and denied participation at this time)   Shelby Morgan 02/10/2022, 7:36 AM Bayard Males, [place] Office: [phone]

## 2022-02-10 NOTE — NC FL2 (Signed)
[place] LEVEL OF CARE SCREENING TOOL     IDENTIFICATION  Patient Name: Shelby Morgan Birthdate: 1956-06-15 Sex: female Admission Date (Current Location): 02/08/2022  [facility] and [place] Number:  [employer] and Address:  [facility] [place] [place], [place] [address] [place], [place] [address]      Provider Number:    Attending Physician Name and Address:  George Hugh, MD  Relative Name and Phone Number:       Current Level of Care: Hospital Recommended Level of Care: [place] Prior Approval Number:    Date Approved/Denied:   PASRR Number: PSARR pending  Discharge Plan: SNF    Current Diagnoses: Patient Active Problem List   Diagnosis Date Noted   DVT (deep venous thrombosis) ([place]) 02/08/2022   Elevated serum creatinine 02/08/2022   Parotid nodule--left 01/29/2022   Generalized anxiety disorder 01/29/2022   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 01/29/2022   Gout flare 01/29/2022   Leucocytosis--reactive due to gout/steriods 01/29/2022   AKI (acute kidney injury) ([place])    History of CVA (cerebrovascular accident) 04/10/2021   History of melanoma 06/20/2018   Chest pain 06/06/2018   Primary hypertension 06/06/2018   Hyperlipidemia associated with type 2 diabetes mellitus ([place]) 06/06/2018   Mediastinal adenopathy 06/06/2018   Elevated LFTs 05/02/2012   Lactic acidosis 05/02/2012   Cellulitis of right leg 05/01/2012   Diabetes mellitus type 2 in obese ([place]) 05/01/2012   Hypokalemia 05/01/2012   Hyponatremia 05/01/2012   NAFLD (nonalcoholic fatty liver disease) 05/14/2011   Obesity 05/14/2011   Hemorrhoids 05/14/2011   Transaminitis 01/29/2011   Hepatomegaly 01/29/2011   Rectal bleed 01/29/2011   Dysphagia 01/29/2011   RUQ pain 01/29/2011   Hypercalcemia 01/29/2011   Anxiety 01/29/2011   GERD (gastroesophageal reflux disease) 01/29/2011    Orientation RESPIRATION BLADDER Height & Weight      Self, Time, Situation  Normal Continent Weight: 220 lb (99.8 kg) Height:  '5\' 2"'$  (157.5 cm)  BEHAVIORAL SYMPTOMS/MOOD NEUROLOGICAL BOWEL NUTRITION STATUS      Continent Diet  AMBULATORY STATUS COMMUNICATION OF NEEDS Skin   Limited Assist Verbally Surgical wounds (wound/incision irritant dermatitis)                       Personal Care Assistance Level of Assistance  Bathing, Feeding, Dressing Bathing Assistance: Limited assistance Feeding assistance: Independent Dressing Assistance: Limited assistance     Functional Limitations Info  Sight, Hearing, Speech Sight Info: Adequate Hearing Info: Adequate Speech Info: Adequate    SPECIAL CARE FACTORS FREQUENCY  PT (By licensed PT), OT (By licensed OT)     PT Frequency: 5x per week OT Frequency: 5x per week            Contractures Contractures Info: Not present    Additional Factors Info  Code Status, Allergies Code Status Info: Full Allergies Info: Codeine           Current Medications (02/10/2022):  This is the current hospital active medication list Current Facility-Administered Medications  Medication Dose Route Frequency Provider Last Rate Last Admin   acetaminophen (TYLENOL) tablet 650 mg  650 mg Oral Q6H PRN Marcelyn Bruins, MD       Or   acetaminophen (TYLENOL) suppository 650 mg  650 mg Rectal Q6H PRN Marcelyn Bruins, MD       apixaban Arne [place]) tablet 10 mg  10 mg Oral BID Marcelyn Bruins, MD   10 mg  at 02/10/22 0919   Followed by   Derrill Memo ON 02/15/2022] apixaban (ELIQUIS) tablet 5 mg  5 mg Oral BID Marcelyn Bruins, MD       busPIRone (BUSPAR) tablet 5 mg  5 mg Oral BID Marcelyn Bruins, MD   5 mg at 02/10/22 0920   [START ON 02/11/2022] furosemide (LASIX) tablet 20 mg  20 mg Oral Daily George Hugh, MD       gabapentin (NEURONTIN) capsule 100 mg  100 mg Oral TID Marcelyn Bruins, MD   100 mg at 02/10/22 0919   insulin aspart (novoLOG) injection 0-15 Units  0-15 Units  Subcutaneous TID WC Marcelyn Bruins, MD   5 Units at 02/10/22 1200   insulin aspart (novoLOG) injection 3 Units  3 Units Subcutaneous TID WC George Hugh, MD   3 Units at 02/10/22 1257   insulin glargine-yfgn (SEMGLEE) injection 5 Units  5 Units Subcutaneous QHS George Hugh, MD   5 Units at 02/09/22 2205   pantoprazole (PROTONIX) EC tablet 40 mg  40 mg Oral Daily Marcelyn Bruins, MD   40 mg at 02/10/22 0920   pneumococcal 20-valent conjugate vaccine (PREVNAR 20) injection 0.5 mL  0.5 mL Intramuscular Tomorrow-1000 George Hugh, MD       polyethylene glycol (MIRALAX / GLYCOLAX) packet 17 g  17 g Oral Daily PRN Marcelyn Bruins, MD       polyvinyl alcohol (LIQUIFILM TEARS) 1.4 % ophthalmic solution 1 drop  1 drop Left Eye PRN Marcelyn Bruins, MD       predniSONE (DELTASONE) tablet 40 mg  40 mg Oral Q breakfast Marcelyn Bruins, MD   40 mg at 02/10/22 0920   rosuvastatin (CRESTOR) tablet 20 mg  20 mg Oral Daily Marcelyn Bruins, MD   20 mg at 02/10/22 0919   sodium chloride flush (NS) 0.9 % injection 3 mL  3 mL Intravenous Q12H Marcelyn Bruins, MD   3 mL at 02/10/22 0174   traMADol (ULTRAM) tablet 50 mg  50 mg Oral Q6H PRN Marcelyn Bruins, MD       traZODone (DESYREL) tablet 50 mg  50 mg Oral QHS PRN Marcelyn Bruins, MD         Discharge Medications: Please see discharge summary for a list of discharge medications.  Relevant Imaging Results:  Relevant Lab Results:   Additional Information SSN [id number]  Vinie Sill, LCSW

## 2022-02-10 NOTE — Evaluation (Signed)
Occupational Therapy Evaluation Patient Details Name: Shelby Morgan MRN: [id number] DOB: October 04, 1955 Today's Date: 02/10/2022   History of Present Illness 66 y/o female admitted 7/16 with bil LE edema, LLE DVT and LLE gout. PMhx: CVA (2022 and june 2023), Bells palsy, HLD, DM, HTN, melanoma, anxiety   Clinical Impression   Since DC from CIR, pt has been at home @ modified independent level with assistance form her Mom. She plans to DC to her house instead of back to her Mom's and she has assistance for the stairs. Increased complaints of pain with mobility and is requesting a WC due to limited mobility. Would benefit from use of 3in1 at bedside at night to reduce risk of falls. Recommend follow up [place] and [place].  All further needs can be addressed by [facility] Recommend pt ambulate with staff and mobility techs.     Recommendations for follow up therapy are one component of a multi-disciplinary discharge planning process, led by the attending physician.  Recommendations may be updated based on patient status, additional functional criteria and insurance authorization.   Follow Up Recommendations  Home health OT    Assistance Recommended at Discharge Intermittent Supervision/Assistance  Patient can return home with the following A little help with bathing/dressing/bathroom;Assistance with cooking/housework;Assist for transportation;Help with stairs or ramp for entrance    Functional Status Assessment  Patient has had a recent decline in their functional status and demonstrates the ability to make significant improvements in function in a reasonable and predictable amount of time.  Equipment Recommendations  Wheelchair (measurements OT);Wheelchair cushion (measurements OT);BSC/3in1    Recommendations for Other Services       Precautions / Restrictions Precautions Precautions: Fall;Other (comment) Precaution Comments: very mild L hemiparesis with impaired sensation Restrictions Weight  Bearing Restrictions: No LLE Weight Bearing: Weight bearing as tolerated      Mobility Bed Mobility               General bed mobility comments: in recliner    Transfers Overall transfer level: Needs assistance   Transfers: Sit to/from Stand Sit to Stand: Supervision                  Balance Overall balance assessment: Needs assistance   Sitting balance-Leahy Scale: Fair     Standing balance support: Bilateral upper extremity supported, Reliant on assistive device for balance Standing balance-Leahy Scale: Poor Standing balance comment: RW or holding onto sink in standing                           ADL either performed or assessed with clinical judgement   ADL Overall ADL's : Needs assistance/impaired                                     Functional mobility during ADLs: Supervision/safety General ADL Comments: Able to complete bathing/dressing with increased time; increased leg pain and increased SOB/fatigue     Vision Baseline Vision/History: 1 Wears glasses (all the time, bifocals) Vision Assessment?: No apparent visual deficits     Perception     Praxis      Pertinent Vitals/Pain Pain Assessment Pain Assessment: 0-10 Pain Score: 8  Pain Location: LLE with prolonged standing Pain Descriptors / Indicators: Aching, Guarding Pain Intervention(s): Limited activity within patient's tolerance     Hand Dominance Right   Extremity/Trunk Assessment Upper Extremity Assessment Upper Extremity  Assessment: LUE deficits/detail LUE Deficits / Details: impaired sensation with decreased fine motor LUE Sensation: decreased light touch;decreased proprioception LUE Coordination: decreased fine motor;decreased gross motor   Lower Extremity Assessment Lower Extremity Assessment: Defer to PT evaluation LLE Deficits / Details: 3/5 knee extenion/flexion and hip flexion, increased difficulty with controlling knee flexion   Cervical /  Trunk Assessment Cervical / Trunk Assessment: Other exceptions Cervical / Trunk Exceptions: rounded shoulders; increased body habitus   Communication Communication Communication: No difficulties   Cognition Arousal/Alertness: Awake/alert Behavior During Therapy: WFL for tasks assessed/performed Overall Cognitive Status: Within Functional Limits for tasks assessed                                       General Comments       Exercises     Shoulder Instructions      Home Living Family/patient expects to be discharged to:: Private residence Living Arrangements: Parent Available Help at Discharge: Family;Available 24 hours/day Type of Home: House Home Access: Stairs to enter [employer] of Steps: 7 Entrance Stairs-Rails: Right;Left Home Layout: Two level Alternate Level Stairs-Number of Steps: 2   Bathroom Shower/Tub: Teacher, early years/pre: Standard Bathroom Accessibility: Yes How Accessible: Accessible via walker Home Equipment: Tub bench;Rolling Walker (2 wheels);Rollator (4 wheels);Hand held shower head;Grab bars - tub/shower   Additional Comments: normally lives alone, staying with mom since CVA  Lives With: Alone    Prior Functioning/Environment Prior Level of Function : Needs assist       Physical Assist : ADLs (physical)     Mobility Comments: Walking with RW since CVA ADLs Comments: mom has been assisting with bathing/dressing; using tub bench        OT Problem List: Decreased strength;Decreased activity tolerance;Impaired balance (sitting and/or standing);Decreased coordination;Decreased safety awareness;Decreased knowledge of use of DME or AE;Cardiopulmonary status limiting activity;Impaired sensation;Obesity;Impaired UE functional use;Pain      OT Treatment/Interventions:      OT Goals(Current goals can be found in the care plan section) Acute Rehab OT Goals Patient Stated Goal: to go to her house OT Goal  Formulation: All assessment and education complete, DC therapy  OT Frequency:      Co-evaluation              AM-PAC OT "6 Clicks" Daily Activity     Outcome Measure Help from another person eating meals?: None Help from another person taking care of personal grooming?: None Help from another person toileting, which includes using toliet, bedpan, or urinal?: A Little Help from another person bathing (including washing, rinsing, drying)?: A Little Help from another person to put on and taking off regular upper body clothing?: None Help from another person to put on and taking off regular lower body clothing?: A Little 6 Click Score: 21   End of Session Nurse Communication: Mobility status  Activity Tolerance: Patient tolerated treatment well Patient left: in chair;with call bell/phone within reach  OT Visit Diagnosis: Unsteadiness on feet (R26.81);Muscle weakness (generalized) (M62.81);Pain Pain - Right/Left: Left Pain - part of body: Leg                Time: 8469-6295 OT Time Calculation (min): 20 min Charges:  OT General Charges $OT Visit: 1 Visit OT Evaluation $OT Eval Low Complexity: [place], OT 02/10/2022   Hersh Minney,HILLARY 02/10/2022, 12:54 PM

## 2022-02-10 NOTE — Evaluation (Signed)
Physical Therapy Evaluation Patient Details Name: Shelby Morgan MRN: [id number] DOB: September 06, 1955 Today's Date: 02/10/2022  History of Present Illness  66 y/o female admitted 7/16 with bil LE edema, LLE DVT and LLE gout. PMhx: CVA (2022 and june 2023), Bells palsy, HLD, DM, HTN, melanoma, anxiety  Clinical Impression  Pt pleasant and eager to get up to sink to brush teeth. Pt report bil LE pain with LLE>RLE with pt premedicated and able to walk short distance in room. Pt reports fear of falling at home with mother (31yo) unable to physically assist if LOB. Pt encouraged to utilize DME appropriate and sitting as needed for ADLs to prevent falls. Pt with decreased strength, function and activity tolerance who will benefit from acute therapy to maximize mobility and safety to decrease burden of care. Encouraged OOB, up to bathroom and increased mobility with nursing staff acutely.        Recommendations for follow up therapy are one component of a multi-disciplinary discharge planning process, led by the attending physician.  Recommendations may be updated based on patient status, additional functional criteria and insurance authorization.  Follow Up Recommendations Home health PT      Assistance Recommended at Discharge Intermittent Supervision/Assistance  Patient can return home with the following  A little help with walking and/or transfers;A little help with bathing/dressing/bathroom;Assistance with cooking/housework;Assist for transportation;Help with stairs or ramp for entrance    Equipment Recommendations BSC/3in1  Recommendations for Other Services  OT consult;Other (comment) (mobility)    Functional Status Assessment Patient has had a recent decline in their functional status and demonstrates the ability to make significant improvements in function in a reasonable and predictable amount of time.     Precautions / Restrictions Precautions Precautions: Fall;Other (comment) Precaution  Comments: very mild L hemiparesis with impaired sensation      Mobility  Bed Mobility Overal bed mobility: Modified Independent             General bed mobility comments: with rail and HOB 10 degrees    Transfers Overall transfer level: Needs assistance   Transfers: Sit to/from Stand Sit to Stand: Supervision           General transfer comment: cues for hand placement with pt able to stand from bed and from chair (x2 trials)    Ambulation/Gait Ambulation/Gait assistance: Min guard Gait Distance (Feet): 12 Feet Assistive device: Rolling walker (2 wheels) Gait Pattern/deviations: Step-through pattern, Decreased stride length, Trunk flexed   Gait velocity interpretation: <1.8 ft/sec, indicate of risk for recurrent falls   General Gait Details: cues for posture, safety and proximity to RW. Pt able to walk 6' to sink with RW then sit to brush teeth prior to additional 12' in room to chair  Stairs            Wheelchair Mobility    Modified Rankin (Stroke Patients Only)       Balance Overall balance assessment: Needs assistance   Sitting balance-Leahy Scale: Fair     Standing balance support: Bilateral upper extremity supported, Reliant on assistive device for balance Standing balance-Leahy Scale: Poor Standing balance comment: RW or holding onto sink in standing                             Pertinent Vitals/Pain Pain Assessment Pain Assessment: 0-10 Pain Score: 5  Pain Location: LLE with prolonged standing Pain Descriptors / Indicators: Aching, Guarding Pain Intervention(s): Limited activity within patient's tolerance,  Monitored during session, Repositioned, Premedicated before session    [place] expects to be discharged to:: Private residence Living Arrangements: Parent Available Help at Discharge: Family;Available 24 hours/day Type of Home: House Home Access: Ramped entrance;Stairs to enter   Entrance  Stairs-Number of Steps: 2 Alternate Level Stairs-Number of Steps: 2 Home Layout: Two level Home Equipment: Tub bench;Rolling Walker (2 wheels) Additional Comments: normally lives alone, staying with mom since CVA    Prior Function Prior Level of Function : Needs assist       Physical Assist : ADLs (physical)     Mobility Comments: Walking with RW since CVA ADLs Comments: mom has been assisting with bathing/dressing     Hand Dominance        Extremity/Trunk Assessment   Upper Extremity Assessment Upper Extremity Assessment: LUE deficits/detail LUE Deficits / Details: impaired sensation with decreased fine motor    Lower Extremity Assessment Lower Extremity Assessment: LLE deficits/detail LLE Deficits / Details: 3/5 knee extenion/flexion and hip flexion, increased difficulty with controlling knee flexion    Cervical / Trunk Assessment Cervical / Trunk Assessment: Other exceptions Cervical / Trunk Exceptions: rounded shoulders  Communication   Communication: No difficulties  Cognition Arousal/Alertness: Awake/alert Behavior During Therapy: WFL for tasks assessed/performed Overall Cognitive Status: Within Functional Limits for tasks assessed                                          General Comments      Exercises General Exercises - Lower Extremity Long Arc Quad: AROM, Both, Seated, 15 reps   Assessment/Plan    PT Assessment Patient needs continued PT services  PT Problem List Decreased strength;Decreased mobility;Decreased safety awareness;Decreased activity tolerance;Decreased balance;Decreased knowledge of use of DME;Pain       PT Treatment Interventions Gait training;DME instruction;Therapeutic exercise;Stair training;Functional mobility training;Therapeutic activities;Patient/family education    PT Goals (Current goals can be found in the Care Plan section)  Acute Rehab PT Goals Patient Stated Goal: return home and get a shower PT Goal  Formulation: With patient Time For Goal Achievement: 02/24/22 Potential to Achieve Goals: Good    Frequency Min 3X/week     Co-evaluation               AM-PAC PT "6 Clicks" Mobility  Outcome Measure Help needed turning from your back to your side while in a flat bed without using bedrails?: None Help needed moving from lying on your back to sitting on the side of a flat bed without using bedrails?: None Help needed moving to and from a bed to a chair (including a wheelchair)?: A Little Help needed standing up from a chair using your arms (e.g., wheelchair or bedside chair)?: A Little Help needed to walk in hospital room?: A Little Help needed climbing 3-5 steps with a railing? : A Lot 6 Click Score: 19    End of Session Equipment Utilized During Treatment: Gait belt Activity Tolerance: Patient tolerated treatment well Patient left: in chair;with call bell/phone within reach (no chair alarm in room and RN made aware, alarm pad under pt) Nurse Communication: Mobility status PT Visit Diagnosis: Other abnormalities of gait and mobility (R26.89);Difficulty in walking, not elsewhere classified (R26.2);Muscle weakness (generalized) (M62.81);Pain Pain - Right/Left: Left Pain - part of body: Leg    Time: 8676-1950 PT Time Calculation (min) (ACUTE ONLY): 29 min   Charges:   PT  Evaluation $PT Eval Moderate Complexity: 1 Mod PT Treatments $Gait Training: 8-22 mins        Bayard Males, PT Acute Rehabilitation Services Office: [place] 02/10/2022, 10:12 AM

## 2022-02-10 NOTE — TOC Transition Note (Signed)
Transition of Care [facility]) - CM/SW Discharge Note   Patient Details  Name: Shelby Morgan MRN: [id number] Date of Birth: 02/07/1956  Transition of Care [facility]) CM/SW Contact:  Vinie Sill, LCSW Phone Number: 02/10/2022, 4:50 PM   Clinical Narrative:     CSW consulted and informed patient lives home alone but has been staying with her mother- however her mother is unable to provide the level of care that is required of the patient at this time. Per chart review -Family and patient agrees it is best for her to get stronger before she can safely return home and move about on her own.  PASRR pending- will upload clinicals once they are signed by attending. TOC will start process and send out referrals, while pending therapy re-evaluations recommendations TOC will provide bed offers once available.  Thurmond Butts, MSW, LCSW Clinical Social Worker      Barriers to Discharge: Unsafe home situation, SNF Pending bed offer   Patient Goals and CMS Choice Patient states their goals for this hospitalization and ongoing recovery are:: wants to return home when able and stronger CMS Medicare.gov Compare Post Acute Care list provided to:: Patient Choice offered to / list presented to : Patient, Adult Children  Discharge Placement                       Discharge Plan and Services In-house Referral: Clinical Social Work Discharge Planning Services: CM Consult Post Acute Care Choice: Home Health, [place]          DME Arranged: 3-N-1, Wheelchair manual                    Social Determinants of Health (SDOH) Interventions     Readmission Risk Interventions     No data to display

## 2022-02-10 NOTE — Progress Notes (Signed)
Pt has at home cpap and placed it on herself.

## 2022-02-11 DIAGNOSIS — I82402 Acute embolism and thrombosis of unspecified deep veins of left lower extremity: Secondary | ICD-10-CM | POA: Diagnosis not present

## 2022-02-11 LAB — GLUCOSE, CAPILLARY
Glucose-Capillary: 121 mg/dL — ABNORMAL HIGH (ref 70–99)
Glucose-Capillary: 179 mg/dL — ABNORMAL HIGH (ref 70–99)
Glucose-Capillary: 252 mg/dL — ABNORMAL HIGH (ref 70–99)
Glucose-Capillary: 272 mg/dL — ABNORMAL HIGH (ref 70–99)

## 2022-02-11 LAB — BASIC METABOLIC PANEL
Anion gap: 11 (ref 5–15)
BUN: 19 mg/dL (ref 8–23)
CO2: 27 mmol/L (ref 22–32)
Calcium: 9.2 mg/dL (ref 8.9–10.3)
Chloride: 100 mmol/L (ref 98–111)
Creatinine, Ser: 1.16 mg/dL — ABNORMAL HIGH (ref 0.44–1.00)
GFR, Estimated: 52 mL/min — ABNORMAL LOW (ref >60)
Glucose, Bld: 166 mg/dL — ABNORMAL HIGH (ref 70–99)
Potassium: 4 mmol/L (ref 3.5–5.1)
Sodium: 138 mmol/L (ref 135–145)

## 2022-02-11 NOTE — Progress Notes (Signed)
Progress Note   Patient: Shelby Morgan [id number]:[id number] DOB: 1956/06/24 DOA: 02/08/2022     0 DOS: the patient was seen and examined on 02/11/2022   Brief hospital course: 66 year old female with PMH of HTN, HL, DM2, 03/2021 and 12/2021 CVAs (on Plavix) who presented to the ED on 7/16 with left leg pain found to have a DVT in the left peroneal vein, left foot gout, and a mild AKI.  CTA of the chest showed no large PE but had limited evaluation of the distal arteries.  Patient's discharge is pending SNF placement.  TOC consulted.  Recently in CIR from 01/14/2022 - 01/29/2022.  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  Echo one month ago showed no right heart strain. - Continue Eliquis (10 mg BID for 7 days until 7/22 followed by 5 mg BID starting 7/23 for 3-6 months). Although this was reported as chronic and distal DVT which usually has low likelihood for PE propagation, given her history of recent CVA, associated left-sided hemiparesis with suspected diminished mobility increasing her thrombotic risk, and duration of DVT not exactly known (as per discussion with vascular surgery, one could see chronic DVT changes on venous Doppler around 6 weeks), would continue careful anticoagulation that was initiated with Eliquis on 7/16.  Interestingly patient was admitted with acute stroke and left-sided hemiparesis on 6/13.  Per neurology notes early on in that admission, it appears that she had 3/5 power on the left side and it is certainly possible that she may have even developed the DVT during that period of left sided relative immobility.  Duration of anticoagulation likely 3 months.  Could consider outpatient hematology consultation to further evaluate and make recommendations.  No prior major bleeding reported.  Plavix was discontinued which may be resumed after  Eliquis has been stopped, for continued stroke prophylaxis.  I have discussed in detail with both the patient and patient's son via phone, updated all above in great detail, answered all their questions, advised regarding continuation of Eliquis given benefits outweighing risks of bleeding.  Both of them are agreeable.  Acute on Chronic Gout of the left foot: - This is Day 4 out of 5 of steroids.  Improving.  Denies prior episodes of gout flare.  Mild Lower Extremity Edema: Patient was complaining of swelling in her feet and was requesting Lasix. The edema was suspected more related to her ankle injury and new DVT.   -S/p IV Lasix 20 mg x once 7/18 followed by home dose of Lasix 20 mg daily. Now only very minimal right dorsal feet edema but rest appears to have resolved.  AKI on CKD2, resolved: - Creatinine is down to 1 mg/dL consistent with baseline.  Creatinine 1.16 today.  Diabetes Mellitus Type 2:  - Continue Lantus 5 units nightly and 3 units pre-meal insulin. - Continue SSI with POC glucose ACHS. Labile and mildly uncontrolled at times.  No change in regimen.  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 - Continue Statin. Ongoing significant left sided numbness.  Hypertension: BP is stable.  Hyperlipidemia: - Statin.  GERD: - PPI  OSA: - Recommend CPAP nightly and appears to have it at bedside.  Physical Deconditioning: Patient uses walker at baseline. Prior to recent stroke and  CIR admission, patient lived at home independently.  Following the recent CIR discharge, she was living with her elderly mother who is no longer able to provide the level of care that patient needs.  PT and OT recommend SNF.  Patient is medically optimized for DC pending bed availability.  TOC has been updated via [facility] order.  Right Ankle Surgery Morbid Obesity: BMI 39.92 kg/m2 Fatty Liver Disease.  Possible liver cirrhosis on CTA  chest-outpatient follow-up. 2012 GI bleed 2012 Bell's Palsy 2009 Melanoma, Stage, had surgery  Subjective:  Overall doing much better.  Reports that she has been ambulating in the room with the help of a walker.  Her legs are better, swelling has improved, still has some pain in the sole of her feet.  Also reports residual entire left-sided numbness and some mild weakness.  Agrees that she will not be able to manage herself at home and her mother will not be able to provide assistance either.  Left great toe pain has significantly improved or even resolved.  No other complaints reported.  Physical Exam: Vitals:   02/11/22 0013 02/11/22 0445 02/11/22 0900 02/11/22 1123  BP: 131/77 140/80 140/72 (!) 163/80  Pulse: 71 72 87 77  Resp: '17 18 16 '$ (!) 24  Temp: 98.5 F (36.9 C) 98.6 F (37 C) 98.4 F (36.9 C) 98.4 F (36.9 C)  TempSrc: Oral Oral Oral Oral  SpO2: 99% 98% 96% 94%  Weight:      Height:       Examination: General exam: Middle-age female, moderately built and morbidly obese sitting up comfortably in bed without distress. Respiratory system: Clear to auscultation.  No increased work of breathing. Cardiovascular system: S1 and S2 heard, RRR.  No JVD, murmurs or pedal edema.  Telemetry personally reviewed: Sinus rhythm.  Brief nonsustained episode of few PVCs noted at 11 PM last night. Gastrointestinal system: Nondistended, soft and nontender.  No organomegaly or masses appreciated.  Normal bowel sounds heard. Nervous System: Alert and oriented.  Left-sided numbness-old. Extremities: Minimal swelling of left big toe MTP joint, no tenderness, warmth or redness appreciated.  Right ankle surgical scar.  Minimal right dorsal foot edema without acute findings.  Bilateral calfs appear symmetrical.  Symmetrical distal pulsations well felt. Skin: No rashes, No bruises, No icterus. MSK: Physical Deconditioning  Data Reviewed: CT Angio Chest PE W and/or Wo Contrast CLINICAL DATA:   Tachycardia, shortness of breath, chest pain  EXAM: CT ANGIOGRAPHY CHEST WITH CONTRAST  TECHNIQUE: Multidetector CT imaging of the chest was performed using the standard protocol during bolus administration of intravenous contrast. Multiplanar CT image reconstructions and MIPs were obtained to evaluate the vascular anatomy.  RADIATION DOSE REDUCTION: This exam was performed according to the departmental dose-optimization program which includes automated exposure control, adjustment of the mA and/or kV according to patient size and/or use of iterative reconstruction technique.  CONTRAST:  18m OMNIPAQUE IOHEXOL 350 MG/ML SOLN  COMPARISON:  06/06/2018  FINDINGS: Cardiovascular: There is homogeneous enhancement in thoracic aorta. There are no intraluminal filling defect in central pulmonary artery branches. Evaluation of small peripheral branches is limited by motion artifact. Scattered coronary artery calcifications are seen.  Mediastinum/Nodes: There are enlarged lymph nodes in mediastinum and both hilar regions with no significant interval change.  Lungs/Pleura: Breathing motion limits evaluation of lung fields. As far as seen, there is no focal pulmonary consolidation. There is narrowing of AP diameter of the trachea and main bronchi, possibly suggesting bronchospasm. There is no pleural effusion or  pneumothorax.  Upper Abdomen: There is fatty infiltration in liver. There is minimal nodularity of liver surface. There is possible hyperplasia in left adrenal.  Musculoskeletal: Degenerative changes are noted in thoracic spine with bony spurs.  Review of the MIP images confirms the above findings.  IMPRESSION: There is no evidence of central pulmonary artery embolism. Evaluation of small peripheral pulmonary artery branches is limited by motion artifact. There is no evidence of thoracic aortic dissection. There is no focal pulmonary consolidation.  Coronary artery  calcifications are seen. Fatty liver. There is minimal nodularity of the liver surface suggesting possible cirrhosis.  Electronically Signed   By: Elmer Picker M.D.   On: 02/08/2022 19:29 VAS [place] LOWER EXTREMITY VENOUS (DVT) (ONLY [facility] & [facility])  Lower Venous DVT Study  Patient Name:  JADENCE KINLAW  Date of Exam:   02/08/2022 Medical Rec #: [id number]       Accession #:    [id number] Date of Birth: [birth year]       Patient Gender: F Patient Age:   9 years Exam Location:  [facility] Procedure:      VAS [place] LOWER EXTREMITY VENOUS (DVT) Referring Phys: Marda Stalker  --------------------------------------------------------------------------------   Indications: Bilateral lower extremity pitting edema and foot pain.   Risk Factors: Immobility (patient was in in patient rehab until last week) Recent stroke affecting left side. Limitations: Edema, patient movement. Comparison Study: Prior negative right LEV done 05/02/12  Performing Technologist: Sharion Dove RVS    Examination Guidelines: A complete evaluation includes B-mode imaging, spectral Doppler, color Doppler, and power Doppler as needed of all accessible portions of each vessel. Bilateral testing is considered an integral part of a complete examination. Limited examinations for reoccurring indications may be performed as noted. The reflux portion of the exam is performed with the patient in reverse Trendelenburg.     +---------+---------------+---------+-----------+----------+--------------+ RIGHT    CompressibilityPhasicitySpontaneityPropertiesThrombus Aging +---------+---------------+---------+-----------+----------+--------------+ CFV      Full           Yes      Yes                                 +---------+---------------+---------+-----------+----------+--------------+ SFJ      Full                                                         +---------+---------------+---------+-----------+----------+--------------+ FV Prox  Full                                                        +---------+---------------+---------+-----------+----------+--------------+ FV Mid   Full                                                        +---------+---------------+---------+-----------+----------+--------------+ FV DistalFull                                                        +---------+---------------+---------+-----------+----------+--------------+  PFV      Full                                                        +---------+---------------+---------+-----------+----------+--------------+ POP      Full           Yes      Yes                                 +---------+---------------+---------+-----------+----------+--------------+ PTV      Full                                                        +---------+---------------+---------+-----------+----------+--------------+ PERO     Full                                                        +---------+---------------+---------+-----------+----------+--------------+        +---------+---------------+---------+-----------+----------+--------------+ LEFT     CompressibilityPhasicitySpontaneityPropertiesThrombus Aging +---------+---------------+---------+-----------+----------+--------------+ CFV      Full           Yes      Yes                                 +---------+---------------+---------+-----------+----------+--------------+ SFJ      Full                                                        +---------+---------------+---------+-----------+----------+--------------+ FV Prox  Full                                                        +---------+---------------+---------+-----------+----------+--------------+ FV Mid   Full                                                         +---------+---------------+---------+-----------+----------+--------------+ FV DistalFull                                                        +---------+---------------+---------+-----------+----------+--------------+ PFV      Full                                                        +---------+---------------+---------+-----------+----------+--------------+  POP      Full           Yes      Yes                                 +---------+---------------+---------+-----------+----------+--------------+ PTV      Full                                                        +---------+---------------+---------+-----------+----------+--------------+ PERO     Partial        No       No                   Chronic        +---------+---------------+---------+-----------+----------+--------------+  Small segment of probable chronic DVT noted in one of the paired peroneal veins mid calf.          Summary: BILATERAL: -No evidence of popliteal cyst, bilaterally. RIGHT: - There is no evidence of deep vein thrombosis in the lower extremity.   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 - Small segment of probable chronic DVT noted in one of the paired peroneal veins mid calf.    *See table(s) above for measurements and observations.  Electronically signed by Harold Barban MD on 02/08/2022 at 7:16:56 PM.      Final   DG Foot Complete Left CLINICAL DATA:  Foot pain, redness and swelling.  EXAM: LEFT FOOT - COMPLETE 3+ VIEW  COMPARISON:  01/20/2022  FINDINGS: No acute fracture. Stable alignment. Moderate midfoot dorsal spurring. Mild tibial talar spurring. Large plantar calcaneal spur and Achilles tendon enthesophyte. No erosion or bone destruction. Generalized soft tissue edema similar to prior exam. Prominent vascular calcifications. No soft tissue gas or radiopaque  foreign body.  IMPRESSION: 1. Generalized soft tissue edema similar to prior exam. No soft tissue gas or radiopaque foreign body. 2. No acute osseous findings. Midfoot and ankle joint osteoarthritis. 3. Plantar calcaneal spur and Achilles tendon enthesophyte.  Electronically Signed   By: Keith Rake M.D.   On: 02/08/2022 17:45 DG Foot Complete Right CLINICAL DATA:  Foot pain, redness and swelling.  EXAM: RIGHT FOOT COMPLETE - 3+ VIEW  COMPARISON:  Radiograph 01/19/2022  FINDINGS: Medial malleolar and distal fibular hardware partially visualized. Possible lucency about the medial malleolar screw, seen only on the oblique view. Degenerative spurring of the midfoot. Tibial talar osteoarthritis. Findings are similar to prior exam. Unchanged plantar calcaneal spur and Achilles tendon enthesophyte. No fracture. No erosive change or bony destruction. Generalized soft tissue edema, similar. No soft tissue gas or radiopaque foreign body.  IMPRESSION: 1. Generalized soft tissue edema, similar to prior. No soft tissue gas or radiopaque foreign body. 2. 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 No acute osseous findings. 3. Surgical hardware in the distal tibia and fibula. Possible but not definite lucency about medial malleolar screw which may represent loosening or infection.  Electronically Signed   By: Keith Rake M.D.   On: 02/08/2022 17:43 DG Chest 2 View CLINICAL DATA:  Shortness of breath  EXAM: CHEST - 2 VIEW  COMPARISON:  06/06/2018  FINDINGS: The heart size and mediastinal contours are within normal limits. No focal airspace consolidation,  pleural effusion, or pneumothorax. The visualized skeletal structures are unremarkable.  IMPRESSION: No active cardiopulmonary disease.  Electronically Signed   By: Davina Poke D.O.   On: 02/08/2022 11:49    Family Communication: I spoke with son over the phone, as noted above.  All of his  questions were answered.  Disposition: Status is: Although care has crossed 2 midnights, there is no medical intensity for inpatient level care.  Thereby continue observation.  Patient is medically optimized for DC to SNF pending bed.  TOC consulted.  Planned Discharge Destination: pending placement Skilled nursing facility DVT prophylaxis: None.  On Eliquis.  Time spent: >30 minutes  Vernell Leep, MD,  FACP, [facility], [facility], [facility] (Care Management Physician Certified) [facility] [place]  To contact the attending provider between 7A-7P or the covering provider during after hours 7P-7A, please log into the web site [email or web address] and access using universal [place] password for that web site. If you do not have the password, please call the hospital operator.  For on call review [email or web address].

## 2022-02-11 NOTE — Progress Notes (Signed)
Physical Therapy Treatment Patient Details Name: Shelby Morgan MRN: [id number] DOB: Jun 28, 1956 Today's Date: 02/11/2022   History of Present Illness 66 y/o female admitted 7/16 with bil LE edema, LLE DVT and LLE gout. PMhx: CVA (2022 and june 2023), Bells palsy, HLD, DM, HTN, melanoma, anxiety    PT Comments    Pt received supine with HOB elevated and agreeable to session with continued progress towards acute goals. Pt able to come to sitting EOB with HOB flat and without use of bedrails with min assist to elevate trunk. Pt demonstrating transfers with RW from various height surfaces with min guard throughout session with good hand placement throughout. Pt demonstrating increased ambulation tolerance with min guard with light cues for upright posture as pt with tendency to direct gaze toward feet secondary to pt c/o numbness. Pt able to ascend/descend steps in stairwell without fault, and receptive to various strategies for stability and safety. Educated pt re; activity recommendations, safety with mobility, and energy conservation with pt verbalizing understanding. Pt endorsing plan to stay with mother at d/c, however mother unable to proved assist needed at this time, discussed with supervising PT and updated disposition recommendation to address deficits and maximize functional independence and safety prior to d/c. Pt continues to benefit from skilled PT services to progress toward functional mobility goals.     Recommendations for follow up therapy are one component of a multi-disciplinary discharge planning process, led by the attending physician.  Recommendations may be updated based on patient status, additional functional criteria and insurance authorization.  Follow Up Recommendations  Skilled nursing-short term rehab (<3 hours/day)     Assistance Recommended at Discharge Intermittent Supervision/Assistance  Patient can return home with the following A little help with walking and/or  transfers;A little help with bathing/dressing/bathroom;Assistance with cooking/housework;Assist for transportation;Help with stairs or ramp for entrance   Equipment Recommendations  BSC/3in1    Recommendations for Other Services       Precautions / Restrictions Precautions Precautions: Fall;Other (comment) Precaution Comments: very mild L hemiparesis with impaired sensation     Mobility  Bed Mobility Overal bed mobility: Needs Assistance Bed Mobility: Supine to Sit     Supine to sit: Min assist     General bed mobility comments: light min assist to elevated trunk with HOB flat and without use of bedrails    Transfers Overall transfer level: Needs assistance Equipment used: Rolling walker (2 wheels) Transfers: Sit to/from Stand Sit to Stand: Min guard           General transfer comment: min guard from various surfaces throughout session, EOB, commode, recliner and BSC at sink after brushing teeth    Ambulation/Gait Ambulation/Gait assistance: Min guard Gait Distance (Feet): 160 Feet Assistive device: Rolling walker (2 wheels) Gait Pattern/deviations: Step-through pattern, Decreased stride length, Trunk flexed, Step-to pattern       General Gait Details: slow steady gaitw ith RW, step to progressing to step through pattern with increased distance, cues for posture, safety and proximity to RW.   Stairs Stairs: Yes Stairs assistance: Min guard, Min assist Stair Management: One rail Right, One rail Left, Step to pattern, Sideways, Forwards Number of Stairs: 6 (3 x2) General stair comments: up/down steps in stairwell, 2 bouts of 3 steps, noted increase in stability with ascent/descent sideways with BUE on one rail with pt in agreement   Wheelchair Mobility    Modified Rankin (Stroke Patients Only)       Balance Overall balance assessment: Needs assistance  Sitting balance-Leahy Scale: Fair     Standing balance support: Bilateral upper extremity  supported, Reliant on assistive device for balance Standing balance-Leahy Scale: Poor Standing balance comment: RW or holding onto sink in standing                            Cognition Arousal/Alertness: Awake/alert Behavior During Therapy: WFL for tasks assessed/performed Overall Cognitive Status: Within Functional Limits for tasks assessed                                          Exercises      General Comments General comments (skin integrity, edema, etc.): encouraged elevation of BLE as pt with c/o B foot edema/pain as well as AP throughout day, placed 2 pillows under BLE at end of session with pt seateed in recliner      Pertinent Vitals/Pain Pain Assessment Pain Assessment: Faces Faces Pain Scale: Hurts a little bit Pain Location: R ankle, LLE with prolonged standing Pain Descriptors / Indicators: Aching, Guarding Pain Intervention(s): Monitored during session, Limited activity within patient's tolerance, Repositioned    Home Living                          Prior Function            PT Goals (current goals can now be found in the care plan section) Acute Rehab PT Goals Patient Stated Goal: get stronger PT Goal Formulation: With patient Time For Goal Achievement: 02/24/22    Frequency    Min 3X/week      PT Plan Discharge plan needs to be updated    Co-evaluation              AM-PAC PT "6 Clicks" Mobility   Outcome Measure  Help needed turning from your back to your side while in a flat bed without using bedrails?: None Help needed moving from lying on your back to sitting on the side of a flat bed without using bedrails?: None Help needed moving to and from a bed to a chair (including a wheelchair)?: A Little Help needed standing up from a chair using your arms (e.g., wheelchair or bedside chair)?: A Little Help needed to walk in hospital room?: A Little Help needed climbing 3-5 steps with a railing? : A  Lot 6 Click Score: 19    End of Session Equipment Utilized During Treatment: Gait belt Activity Tolerance: Patient tolerated treatment well Patient left: in chair;with call bell/phone within reach Nurse Communication: Mobility status PT Visit Diagnosis: Other abnormalities of gait and mobility (R26.89);Difficulty in walking, not elsewhere classified (R26.2);Muscle weakness (generalized) (M62.81);Pain Pain - Right/Left: Left Pain - part of body: Leg     Time: 0938-1829 PT Time Calculation (min) (ACUTE ONLY): 30 min  Charges:  $Gait Training: 8-22 mins $Therapeutic Activity: 8-22 mins                     Amor Packard R. PTA Acute Rehabilitation Services Office: [place] 02/11/2022, 9:51 AM

## 2022-02-11 NOTE — TOC Progression Note (Signed)
Transition of Care [facility]) - Progression Note    Patient Details  Name: Shelby Morgan MRN: [id number] Date of Birth: Feb 13, 1956  Transition of Care [facility]) CM/SW [place], [place] Phone Number: 02/11/2022, 4:47 PM  Clinical Narrative:     PSARR remains pending- clinicals has been updated   CSW spoke with patient's son,[name]- informed of bed offers. He will call back and provide CSW with SNF choice   TOC will continue to follow and assist with discharge planning.  Thurmond Butts, MSW, LCSW Clinical Social Worker    Expected Discharge Plan: [place] Barriers to Discharge: Ship broker, [place] (PASRR)  Expected Discharge Plan and Services Expected Discharge Plan: [place] In-house Referral: Clinical Social Work Discharge Planning Services: CM Consult Post Acute Care Choice: [place], [place] Living arrangements for the past 2 months: Single Family Home (was staying w/ 50yrold mother, however lives alone in apt) Expected Discharge Date: 02/10/22               DME Arranged: 3-N-1, [employer], multimedia                    Social Determinants of Health (SDOH) Interventions    Readmission Risk Interventions     No data to display

## 2022-02-11 NOTE — Progress Notes (Signed)
Mobility Specialist: Progress Note   02/11/22 1624  Mobility  Activity Ambulated with assistance in hallway  Level of Assistance Contact guard assist, steadying assist  Assistive Device Front wheel walker  LLE Weight Bearing WBAT  Distance Ambulated (ft) 210 ft  Activity Response Tolerated well  $Mobility charge 1 Mobility   Post-Mobility: 84 HR  Pt received in the bed and agreeable to mobility. C/o bilateral foot pain, as well as general fatigue. Pt to the recliner after session with call bell and phone in reach.   [facility] Valerie Fredin Mobility Specialist Mobility Specialist [address] [place]: [phone]

## 2022-02-12 DIAGNOSIS — Z8673 Personal history of transient ischemic attack (TIA), and cerebral infarction without residual deficits: Secondary | ICD-10-CM | POA: Diagnosis not present

## 2022-02-12 DIAGNOSIS — E669 Obesity, unspecified: Secondary | ICD-10-CM | POA: Diagnosis not present

## 2022-02-12 DIAGNOSIS — I82402 Acute embolism and thrombosis of unspecified deep veins of left lower extremity: Secondary | ICD-10-CM | POA: Diagnosis not present

## 2022-02-12 DIAGNOSIS — E1169 Type 2 diabetes mellitus with other specified complication: Secondary | ICD-10-CM | POA: Diagnosis not present

## 2022-02-12 LAB — GLUCOSE, CAPILLARY
Glucose-Capillary: 129 mg/dL — ABNORMAL HIGH (ref 70–99)
Glucose-Capillary: 158 mg/dL — ABNORMAL HIGH (ref 70–99)
Glucose-Capillary: 322 mg/dL — ABNORMAL HIGH (ref 70–99)
Glucose-Capillary: 253 mg/dL — ABNORMAL HIGH (ref 70–99)

## 2022-02-12 LAB — CBC
HCT: 30.7 % — ABNORMAL LOW (ref 36.0–46.0)
Hemoglobin: 10.6 g/dL — ABNORMAL LOW (ref 12.0–15.0)
MCH: 28 pg (ref 26.0–34.0)
MCHC: 34.5 g/dL (ref 30.0–36.0)
MCV: 81.2 fL (ref 80.0–100.0)
Platelets: 402 10*3/uL — ABNORMAL HIGH (ref 150–400)
RBC: 3.78 MIL/uL — ABNORMAL LOW (ref 3.87–5.11)
RDW: 13.2 % (ref 11.5–15.5)
WBC: 9.2 10*3/uL (ref 4.0–10.5)
nRBC: 0 % (ref 0.0–0.2)

## 2022-02-12 LAB — BASIC METABOLIC PANEL
Anion gap: 9 (ref 5–15)
BUN: 23 mg/dL (ref 8–23)
CO2: 26 mmol/L (ref 22–32)
Calcium: 9.1 mg/dL (ref 8.9–10.3)
Chloride: 102 mmol/L (ref 98–111)
Creatinine, Ser: 1.02 mg/dL — ABNORMAL HIGH (ref 0.44–1.00)
GFR, Estimated: >60 mL/min (ref >60)
Glucose, Bld: 206 mg/dL — ABNORMAL HIGH (ref 70–99)
Potassium: 4.1 mmol/L (ref 3.5–5.1)
Sodium: 137 mmol/L (ref 135–145)

## 2022-02-12 MED ORDER — INSULIN ASPART 100 UNIT/ML IJ SOLN
5.0000 [IU] | Freq: Three times a day (TID) | INTRAMUSCULAR | Status: DC
  Administered 2022-02-12 – 2022-02-16 (×14): 5 [IU] via SUBCUTANEOUS

## 2022-02-12 MED ORDER — MAGNESIUM 400 MG PO CAPS: 400.0000 mg | ORAL_CAPSULE | Freq: Every day | ORAL | Status: DC

## 2022-02-12 MED ORDER — MAGNESIUM OXIDE -MG SUPPLEMENT 400 (240 MG) MG PO TABS
400.0000 mg | ORAL_TABLET | Freq: Every day | ORAL | Status: DC
  Administered 2022-02-12 – 2022-02-16 (×5): 400 mg via ORAL
  Filled 2022-02-12 (×5): qty 1

## 2022-02-12 NOTE — Plan of Care (Signed)

## 2022-02-12 NOTE — TOC Progression Note (Addendum)
Transition of Care [facility]) - Progression Note    Patient Details  Name: Shelby Morgan MRN: [id number] Date of Birth: 1956/07/09  Transition of Care [facility]) CM/SW [place], [place] Phone Number: 02/12/2022, 3:54 PM  Clinical Narrative:     Spoke with patient's son,Shelby Morgan- he has selected [place]. CSW called SNF left voice message, family has selected their facility and to confirm availability. SNF will need to seek authorization w/[employer] if accepted.  Received PSARR #[id number]  TOC awaiting confirmation from SNF TOC will continue to follow and assist with discharge planning.   Thurmond Butts, MSW, LCSW Clinical Social Worker    Expected Discharge Plan: [place] Barriers to Discharge: Ship broker, [place] (PASRR)  Expected Discharge Plan and Services Expected Discharge Plan: [place] In-house Referral: Clinical Social Work Discharge Planning Services: CM Consult Post Acute Care Choice: [place], [place] Living arrangements for the past 2 months: Single Family Home (was staying w/ 36yrold mother, however lives alone in apt) Expected Discharge Date: 02/10/22               DME Arranged: 3-N-1, [employer], multimedia                    Social Determinants of Health (SDOH) Interventions    Readmission Risk Interventions     No data to display

## 2022-02-12 NOTE — Progress Notes (Signed)
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  CTA of the chest showed no large PE but had limited evaluation of the distal arteries.  Patient's discharge is pending SNF placement.  TOC consulted.  Recently in CIR from 01/14/2022 - 01/29/2022.  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  Echo one month ago showed no right heart strain. - Continue Eliquis (10 mg BID for 7 days until 7/22 followed by 5 mg BID starting 7/23 for 3-6 months). Although this was reported as chronic and distal DVT which usually has low likelihood for PE propagation, given her history of recent CVA, associated left-sided hemiparesis with suspected diminished mobility increasing her thrombotic risk, and duration of DVT not exactly known (as per discussion with vascular surgery, one could see chronic DVT changes on venous Doppler around 6 weeks), would continue careful anticoagulation that was initiated with Eliquis on 7/16.  Interestingly patient was admitted with acute stroke and left-sided hemiparesis on 6/13.  Per neurology notes early on in that admission, it appears that she had 3/5 power on the left side and it is certainly possible that she may have even developed the DVT during that period of left sided relative immobility.  Duration of anticoagulation likely 3 months.  No prior major bleeding reported.  Plavix was discontinued which may be resumed after Eliquis has been stopped, for continued stroke prophylaxis.  Dr. Algis Liming discussed in detail with  both the patient and patient's son via phone, updated all above in great detail, answered all their questions, advised regarding continuation of Eliquis given benefits outweighing risks of bleeding.  Both of them are agreeable.  Acute on Chronic Gout of the left foot: -  steroids to stop tomorrow.  Improving.  Denies prior episodes of gout flare.  Mild Lower Extremity Edema: Patient was complaining of swelling in her feet and was requesting Lasix. The edema was suspected more related to her ankle injury and new DVT.   -S/p IV Lasix 20 mg x once 7/18 followed by home dose of Lasix 20 mg daily. Now only very minimal right dorsal feet edema but rest appears to have resolved.  AKI on CKD2, resolved: - Creatinine is down to 1 mg/dL consistent with baseline  Diabetes Mellitus Type 2:  - Continue Lantus 5 units nightly and 3 units pre-meal insulin. - Continue SSI with POC glucose ACHS. Labile and mildly uncontrolled at times.  No change in regimen.  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 - Continue Statin. Ongoing significant left sided numbness.  Hypertension: BP is stable.  Hyperlipidemia: - Statin.  GERD: - PPI  OSA: - Recommend CPAP nightly and appears to have it at bedside.  Obesity Estimated body mass index is 40.24 kg/m as calculated from the following:   Height as of this encounter: '5\' 2"'$  (1.575 m).   Weight as of this encounter: 99.8  kg.    Physical Deconditioning: Patient uses walker at baseline. Prior to recent stroke and CIR admission, patient lived at home independently.  Following the recent CIR discharge, she was living with her elderly mother who is no longer able to provide the level of care that patient needs.  PT and OT recommend SNF.  Patient is medically optimized for DC pending bed availability.  TOC has been updated via [facility] order.  Right Ankle Surgery Fatty Liver Disease.  Possible  liver cirrhosis on CTA chest-outpatient follow-up. 2012 GI bleed 2012 Bell's Palsy 2009 Melanoma, Stage, had surgery  Subjective:  Agreeable to SNF  Physical Exam: Vitals:   02/11/22 2015 02/11/22 2354 02/12/22 0346 02/12/22 0837  BP: (!) 148/60 139/88 139/87 (!) 155/84  Pulse: 89 74 63 81  Resp: '20 20 17 19  '$ Temp: 98.1 F (36.7 C) 98.1 F (36.7 C) 98.2 F (36.8 C) 97.8 F (36.6 C)  TempSrc: Oral Oral Axillary Oral  SpO2: 96% 97% 99% 100%  Weight:      Height:       Examination:  General: Appearance:    Severely obese female in no acute distress     Lungs:     respirations unlabored  Heart:    Normal heart rate.   MS:   All extremities are intact.   Neurologic:   Awake, alert- sometimes with word finding difficulties     Data Reviewed: CT Angio Chest PE W and/or Wo Contrast CLINICAL DATA:  Tachycardia, shortness of breath, chest pain  EXAM: CT ANGIOGRAPHY CHEST WITH CONTRAST  TECHNIQUE: Multidetector CT imaging of the chest was performed using the standard protocol during bolus administration of intravenous contrast. Multiplanar CT image reconstructions and MIPs were obtained to evaluate the vascular anatomy.  RADIATION DOSE REDUCTION: This exam was performed according to the departmental dose-optimization program which includes automated exposure control, adjustment of the mA and/or kV according to patient size and/or use of iterative reconstruction technique.  CONTRAST:  60m OMNIPAQUE IOHEXOL 350 MG/ML SOLN  COMPARISON:  06/06/2018  FINDINGS: Cardiovascular: There is homogeneous enhancement in thoracic aorta. There are no intraluminal filling defect in central pulmonary artery branches. Evaluation of small peripheral branches is limited by motion artifact. Scattered coronary artery calcifications are seen.  Mediastinum/Nodes: There are enlarged lymph nodes in mediastinum and both hilar regions with no significant interval change.  Lungs/Pleura:  Breathing motion limits evaluation of lung fields. As far as seen, there is no focal pulmonary consolidation. There is narrowing of AP diameter of the trachea and main bronchi, possibly suggesting bronchospasm. There is no pleural effusion or pneumothorax.  Upper Abdomen: There is fatty infiltration in liver. There is minimal nodularity of liver surface. There is possible hyperplasia in left adrenal.  Musculoskeletal: Degenerative changes are noted in thoracic spine with bony spurs.  Review of the MIP images confirms the above findings.  IMPRESSION: There is no evidence of central pulmonary artery embolism. Evaluation of small peripheral pulmonary artery branches is limited by motion artifact. There is no evidence of thoracic aortic dissection. There is no focal pulmonary consolidation.  Coronary artery calcifications are seen. Fatty liver. There is minimal nodularity of the liver surface suggesting possible cirrhosis.  Electronically Signed   By: PElmer PickerM.D.   On: 02/08/2022 19:29 VAS [place] EXTREMITY VENOUS (DVT) (ONLY [facility] & [facility])  Lower Venous DVT Study  Patient Name:  Shelby Morgan Date of Exam:   02/08/2022 Medical Rec #: [id number]  Accession #:    [id number] Date of Birth: 10-04-1955       Patient Gender: F Patient Age:   52 years Exam Location:  [facility] Procedure:      VAS [place] LOWER EXTREMITY VENOUS (DVT) Referring Phys: Marda Stalker  --------------------------------------------------------------------------------   Indications: Bilateral lower extremity pitting edema and foot pain.   Risk Factors: Immobility (patient was in in patient rehab until last week) Recent stroke affecting left side. Limitations: Edema, patient movement. Comparison Study: Prior negative right LEV done 05/02/12  Performing Technologist: Sharion Dove RVS    Examination Guidelines: A complete evaluation includes B-mode imaging, spectral Doppler, color  Doppler, and power Doppler as needed of all accessible portions of each vessel. Bilateral testing is considered an integral part of a complete examination. Limited examinations for reoccurring indications may be performed as noted. The reflux portion of the exam is performed with the patient in reverse Trendelenburg.     +---------+---------------+---------+-----------+----------+--------------+ RIGHT    CompressibilityPhasicitySpontaneityPropertiesThrombus Aging +---------+---------------+---------+-----------+----------+--------------+ CFV      Full           Yes      Yes                                 +---------+---------------+---------+-----------+----------+--------------+ SFJ      Full                                                        +---------+---------------+---------+-----------+----------+--------------+ FV Prox  Full                                                        +---------+---------------+---------+-----------+----------+--------------+ FV Mid   Full                                                        +---------+---------------+---------+-----------+----------+--------------+ FV DistalFull                                                        +---------+---------------+---------+-----------+----------+--------------+ PFV      Full                                                        +---------+---------------+---------+-----------+----------+--------------+ POP      Full           Yes      Yes                                 +---------+---------------+---------+-----------+----------+--------------+ PTV      Full                                                        +---------+---------------+---------+-----------+----------+--------------+  PERO     Full                                                         +---------+---------------+---------+-----------+----------+--------------+        +---------+---------------+---------+-----------+----------+--------------+ LEFT     CompressibilityPhasicitySpontaneityPropertiesThrombus Aging +---------+---------------+---------+-----------+----------+--------------+ CFV      Full           Yes      Yes                                 +---------+---------------+---------+-----------+----------+--------------+ SFJ      Full                                                        +---------+---------------+---------+-----------+----------+--------------+ FV Prox  Full                                                        +---------+---------------+---------+-----------+----------+--------------+ FV Mid   Full                                                        +---------+---------------+---------+-----------+----------+--------------+ FV DistalFull                                                        +---------+---------------+---------+-----------+----------+--------------+ PFV      Full                                                        +---------+---------------+---------+-----------+----------+--------------+ POP      Full           Yes      Yes                                 +---------+---------------+---------+-----------+----------+--------------+ PTV      Full                                                        +---------+---------------+---------+-----------+----------+--------------+ PERO     Partial        No       No  Chronic        +---------+---------------+---------+-----------+----------+--------------+  Small segment of probable chronic DVT noted in one of the paired peroneal veins mid calf.          Summary: BILATERAL: -No evidence of popliteal cyst, bilaterally. RIGHT: - There is no evidence of deep vein thrombosis in the  lower extremity.   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 - Small segment of probable chronic DVT noted in one of the paired peroneal veins mid calf.    *See table(s) above for measurements and observations.  Electronically signed by Harold Barban MD on 02/08/2022 at 7:16:56 PM.      Final   DG Foot Complete Left CLINICAL DATA:  Foot pain, redness and swelling.  EXAM: LEFT FOOT - COMPLETE 3+ VIEW  COMPARISON:  01/20/2022  FINDINGS: No acute fracture. Stable alignment. Moderate midfoot dorsal spurring. Mild tibial talar spurring. Large plantar calcaneal spur and Achilles tendon enthesophyte. No erosion or bone destruction. Generalized soft tissue edema similar to prior exam. Prominent vascular calcifications. No soft tissue gas or radiopaque foreign body.  IMPRESSION: 1. Generalized soft tissue edema similar to prior exam. No soft tissue gas or radiopaque foreign body. 2. No acute osseous findings. Midfoot and ankle joint osteoarthritis. 3. Plantar calcaneal spur and Achilles tendon enthesophyte.  Electronically Signed   By: Keith Rake M.D.   On: 02/08/2022 17:45 DG Foot Complete Right CLINICAL DATA:  Foot pain, redness and swelling.  EXAM: RIGHT FOOT COMPLETE - 3+ VIEW  COMPARISON:  Radiograph 01/19/2022  FINDINGS: Medial malleolar and distal fibular hardware partially visualized. Possible lucency about the medial malleolar screw, seen only on the oblique view. Degenerative spurring of the midfoot. Tibial talar osteoarthritis. Findings are similar to prior exam. Unchanged plantar calcaneal spur and Achilles tendon enthesophyte. No fracture. No erosive change or bony destruction. Generalized soft tissue edema, similar. No soft tissue gas or radiopaque foreign body.  IMPRESSION: 1. Generalized soft tissue edema, similar to prior. No soft tissue gas or radiopaque foreign body. 2. Similar  osteoarthritis of the midfoot and ankle joint. No acute osseous findings. 3. Surgical hardware in the distal tibia and fibula. Possible but not definite lucency about medial malleolar screw which may represent loosening or infection.  Electronically Signed   By: Keith Rake M.D.   On: 02/08/2022 17:43 DG Chest 2 View CLINICAL DATA:  Shortness of breath  EXAM: CHEST - 2 VIEW  COMPARISON:  06/06/2018  FINDINGS: The heart size and mediastinal contours are within normal limits. No focal airspace consolidation, pleural effusion, or pneumothorax. The visualized skeletal structures are unremarkable.  IMPRESSION: No active cardiopulmonary disease.  Electronically Signed   By: Davina Poke D.O.   On: 02/08/2022 11:49    Family Communication: none at bedside  Disposition: Status is: Although care has crossed 2 midnights, there is no medical intensity for inpatient level care.  Thereby continue observation.  Patient is medically optimized for DC to SNF pending bed.  TOC consulted.  Planned Discharge Destination: pending placement Skilled nursing facility DVT prophylaxis: None.  On Eliquis.  Time spent: >30 minutes  Geradine Girt, DO,    To contact the attending provider between 7A-7P or the covering provider during after hours 7P-7A, please log into the web site [email or web address] and access using universal [place] password for that web site. If you do not have the password, please call the hospital operator.  For on call review [email or web address].

## 2022-02-12 NOTE — Inpatient Diabetes Management (Addendum)
Inpatient Diabetes Program Recommendations  AACE/ADA: New Consensus Statement on Inpatient Glycemic Control (2015)  Target Ranges:  Prepandial:   less than 140 mg/dL      Peak postprandial:   less than 180 mg/dL (1-2 hours)      Critically ill patients:  140 - 180 mg/dL   Lab Results  Component Value Date   GLUCAP 129 (H) 02/12/2022   HGBA1C 7.1 (H) 01/07/2022    Review of Glycemic Control  Latest Reference Range & Units 02/11/22 16:45 02/11/22 21:33 02/12/22 06:18  Glucose-Capillary 70 - 99 mg/dL 252 (H) 272 (H) 129 (H)  (H): Data is abnormally high Diabetes history: Type 2 DM Outpatient Diabetes medications: Amaryl 2 mg QD, Tradjenta 5 mg QD,  Current orders for Inpatient glycemic control: Novolog 0-15 units TID, Novolog 3 units TID, Semglee 5 units QHS Prednisone Inpatient Diabetes Program Recommendations:    If the setting of steroids, Consider increasing meal coverage to Novolog 5 units TID (Assuming patient is consuming >50% of meals) Anticipate need for titration was steroid dose compeleted.   Thanks,  Bronson Curb, MSN, RNC-OB Diabetes Coordinator [phone] (8a-5p)

## 2022-02-13 DIAGNOSIS — E1169 Type 2 diabetes mellitus with other specified complication: Secondary | ICD-10-CM | POA: Diagnosis not present

## 2022-02-13 DIAGNOSIS — Z8673 Personal history of transient ischemic attack (TIA), and cerebral infarction without residual deficits: Secondary | ICD-10-CM | POA: Diagnosis not present

## 2022-02-13 DIAGNOSIS — E669 Obesity, unspecified: Secondary | ICD-10-CM | POA: Diagnosis not present

## 2022-02-13 DIAGNOSIS — I82402 Acute embolism and thrombosis of unspecified deep veins of left lower extremity: Secondary | ICD-10-CM | POA: Diagnosis not present

## 2022-02-13 LAB — GLUCOSE, CAPILLARY
Glucose-Capillary: 158 mg/dL — ABNORMAL HIGH (ref 70–99)
Glucose-Capillary: 164 mg/dL — ABNORMAL HIGH (ref 70–99)
Glucose-Capillary: 230 mg/dL — ABNORMAL HIGH (ref 70–99)
Glucose-Capillary: 209 mg/dL — ABNORMAL HIGH (ref 70–99)

## 2022-02-13 MED ORDER — HYDRALAZINE HCL 20 MG/ML IJ SOLN
5.0000 mg | INTRAMUSCULAR | Status: DC | PRN
  Administered 2022-02-13: 5 mg via INTRAVENOUS
  Filled 2022-02-13: qty 1

## 2022-02-13 MED ORDER — TRAMADOL HCL 50 MG PO TABS: 50.0000 mg | ORAL_TABLET | Freq: Four times a day (QID) | ORAL | 0 refills | Status: DC | PRN

## 2022-02-13 NOTE — Progress Notes (Addendum)
Progress Note   Patient: Shelby Morgan QMV:[id number] DOB: Jul 19, 1956 DOA: 02/08/2022     0 DOS: the patient was seen and examined on 02/13/2022   Brief hospital course: 66 year old female with PMH of HTN, HL, DM2, 03/2021 and 12/2021 CVAs (on Plavix) who presented to the ED on 7/16 with left leg pain found to have a DVT in the left peroneal vein, left foot gout, and a mild AKI.  CTA of the chest showed no large PE but had limited evaluation of the distal arteries.  Patient's discharge is pending SNF placement.  TOC consulted.  Recently in CIR from 01/14/2022 - 01/29/2022.  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  Echo one month ago showed no right heart strain. - Continue Eliquis (10 mg BID for 7 days until 7/22 followed by 5 mg BID starting 7/23 for 3-6 months). Although this was reported as chronic and distal DVT which usually has low likelihood for PE propagation, given her history of recent CVA, associated left-sided hemiparesis with suspected diminished mobility increasing her thrombotic risk, and duration of DVT not exactly known (as per discussion with vascular surgery, one could see chronic DVT changes on venous Doppler around 6 weeks), would continue careful anticoagulation that was initiated with Eliquis on 7/16.  Interestingly patient was admitted with acute stroke and left-sided hemiparesis on 6/13.  Per neurology notes early on in that admission, it appears that she had 3/5 power on the left side and it is certainly possible that she may have even developed the DVT during that period of left sided relative immobility.  Duration of anticoagulation likely 3 months.  No prior major bleeding reported.  Plavix was discontinued which may be resumed after Eliquis has been stopped, for continued stroke prophylaxis.  Dr. Algis Liming discussed in detail with  both the patient and patient's son via phone, updated all above in great detail, answered all their questions, advised regarding continuation of Eliquis given benefits outweighing risks of bleeding.  Both of them are agreeable.  Acute on Chronic Gout of the left foot: -  steroids stopped.  Improving.  Denies prior episodes of gout flare.  Mild Lower Extremity Edema: Patient was complaining of swelling in her feet and was requesting Lasix. The edema was suspected more related to her ankle injury and new DVT.   -S/p IV Lasix 20 mg x once 7/18 followed by home dose of Lasix 20 mg daily. Now only very minimal right dorsal feet edema but rest appears to have resolved.  AKI on CKD2, resolved: - Creatinine is down to 1 mg/dL consistent with baseline  Diabetes Mellitus Type 2:  - Continue Lantus 5 units nightly and 3 units pre-meal insulin. - Continue SSI with POC glucose ACHS. Labile and mildly uncontrolled at times.  No change in regimen.  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 - Continue Statin. Ongoing significant left sided numbness.  Hypertension: BP is stable.  Hyperlipidemia: - Statin.  GERD: - PPI  OSA: - Recommend CPAP nightly and appears to have it at bedside.  Obesity Estimated body mass index is 40.24 kg/m as calculated from the following:   Height as of this encounter: '5\' 2"'$  (1.575 m).   Weight as of this encounter: 99.8 kg.  Physical Deconditioning: Patient uses walker at baseline. Prior to recent stroke and CIR admission, patient lived at home independently.  Following the recent CIR discharge, she was living with her elderly mother who is no longer able to provide the level of care that patient needs.  PT and OT recommend SNF.  Patient is medically optimized for DC pending bed availability.    Right Ankle Surgery Fatty Liver Disease.  Possible liver cirrhosis on CTA chest-outpatient  follow-up. 2012 GI bleed 2012 Bell's Palsy 2009 Melanoma, Stage, had surgery  Subjective:  Still waiting on SNF placement  Physical Exam: Vitals:   02/12/22 2001 02/12/22 2326 02/13/22 0412 02/13/22 0920  BP: (!) 171/98 133/75 138/78 (!) 158/78  Pulse: 100 72 66 73  Resp: '20 20 17 20  '$ Temp: 98.3 F (36.8 C) 98.2 F (36.8 C) 98.3 F (36.8 C) 99.4 F (37.4 C)  TempSrc: Oral Oral Oral Oral  SpO2: 98% 96% 93% 97%  Weight:      Height:       Examination:   General: Appearance:    Severely obese female in no acute distress     Lungs:     respirations unlabored  Heart:    Normal heart rate.   MS:   All extremities are intact.   Neurologic:   Awake, alert       Data Reviewed: CT Angio Chest PE W and/or Wo Contrast CLINICAL DATA:  Tachycardia, shortness of breath, chest pain  EXAM: CT ANGIOGRAPHY CHEST WITH CONTRAST  TECHNIQUE: Multidetector CT imaging of the chest was performed using the standard protocol during bolus administration of intravenous contrast. Multiplanar CT image reconstructions and MIPs were obtained to evaluate the vascular anatomy.  RADIATION DOSE REDUCTION: This exam was performed according to the departmental dose-optimization program which includes automated exposure control, adjustment of the mA and/or kV according to patient size and/or use of iterative reconstruction technique.  CONTRAST:  64m OMNIPAQUE IOHEXOL 350 MG/ML SOLN  COMPARISON:  06/06/2018  FINDINGS: Cardiovascular: There is homogeneous enhancement in thoracic aorta. There are no intraluminal filling defect in central pulmonary artery branches. Evaluation of small peripheral branches is limited by motion artifact. Scattered coronary artery calcifications are seen.  Mediastinum/Nodes: There are enlarged lymph nodes in mediastinum and both hilar regions with no significant interval change.  Lungs/Pleura: Breathing motion limits evaluation of lung fields. As far as seen,  there is no focal pulmonary consolidation. There is narrowing of AP diameter of the trachea and main bronchi, possibly suggesting bronchospasm. There is no pleural effusion or pneumothorax.  Upper Abdomen: There is fatty infiltration in liver. There is minimal nodularity of liver surface. There is possible hyperplasia in left adrenal.  Musculoskeletal: Degenerative changes are noted in thoracic spine with bony spurs.  Review of the MIP images confirms the above findings.  IMPRESSION: There is no evidence of central pulmonary artery embolism. Evaluation of small peripheral pulmonary artery branches is limited by motion artifact. There is no evidence of thoracic aortic dissection. There is no focal pulmonary consolidation.  Coronary artery calcifications are seen. Fatty liver. There is minimal nodularity of the liver surface suggesting possible cirrhosis.  Electronically Signed   By: PElmer PickerM.D.   On: 02/08/2022 19:29 VAS [place]OWER EXTREMITY VENOUS (DVT) (ONLY [facility] & [facility])  Lower Venous DVT Study  Patient Name:  PADRIONA KANEY Date of Exam:   02/08/2022 Medical Rec #: [id number]      Accession #:    [id number] of  Birth: [birth year]       Patient Gender: F Patient Age:   74 years Exam Location[facility]  [facility] Procedure:      VAS [place] LOWER EXTREMITY VENOUS (DVT) Referring Phys: Marda Stalker  --------------------------------------------------------------------------------   Indications: Bilateral lower extremity pitting edema and foot pain.   Risk Factors: Immobility (patient was in in patient rehab until last week) Recent stroke affecting left side. Limitations: Edema, patient movement. Comparison Study: Prior negative right LEV done 05/02/12  Performing Technologist: Sharion Dove RVS    Examination Guidelines: A complete evaluation includes B-mode imaging, spectral Doppler, color Doppler, and power Doppler as needed of all accessible portions of  each vessel. Bilateral testing is considered an integral part of a complete examination. Limited examinations for reoccurring indications may be performed as noted. The reflux portion of the exam is performed with the patient in reverse Trendelenburg.     +---------+---------------+---------+-----------+----------+--------------+ RIGHT    CompressibilityPhasicitySpontaneityPropertiesThrombus Aging +---------+---------------+---------+-----------+----------+--------------+ CFV      Full           Yes      Yes                                 +---------+---------------+---------+-----------+----------+--------------+ SFJ      Full                                                        +---------+---------------+---------+-----------+----------+--------------+ FV Prox  Full                                                        +---------+---------------+---------+-----------+----------+--------------+ FV Mid   Full                                                        +---------+---------------+---------+-----------+----------+--------------+ FV DistalFull                                                        +---------+---------------+---------+-----------+----------+--------------+ PFV      Full                                                        +---------+---------------+---------+-----------+----------+--------------+ POP      Full           Yes      Yes                                 +---------+---------------+---------+-----------+----------+--------------+ PTV      Full                                                        +---------+---------------+---------+-----------+----------+--------------+  PERO     Full                                                        +---------+---------------+---------+-----------+----------+--------------+         +---------+---------------+---------+-----------+----------+--------------+ LEFT     CompressibilityPhasicitySpontaneityPropertiesThrombus Aging +---------+---------------+---------+-----------+----------+--------------+ CFV      Full           Yes      Yes                                 +---------+---------------+---------+-----------+----------+--------------+ SFJ      Full                                                        +---------+---------------+---------+-----------+----------+--------------+ FV Prox  Full                                                        +---------+---------------+---------+-----------+----------+--------------+ FV Mid   Full                                                        +---------+---------------+---------+-----------+----------+--------------+ FV DistalFull                                                        +---------+---------------+---------+-----------+----------+--------------+ PFV      Full                                                        +---------+---------------+---------+-----------+----------+--------------+ POP      Full           Yes      Yes                                 +---------+---------------+---------+-----------+----------+--------------+ PTV      Full                                                        +---------+---------------+---------+-----------+----------+--------------+ PERO     Partial        No       No  Chronic        +---------+---------------+---------+-----------+----------+--------------+  Small segment of probable chronic DVT noted in one of the paired peroneal veins mid calf.          Summary: BILATERAL: -No evidence of popliteal cyst, bilaterally. RIGHT: - There is no evidence of deep vein thrombosis in the lower extremity.   LEFT: - Findings consistent with chronic deep vein thrombosis  involving the left peroneal veins. - Small segment of probable chronic DVT noted in one of the paired peroneal veins mid calf.    *See table(s) above for measurements and observations.  Electronically signed by Harold Barban MD on 02/08/2022 at 7:16:56 PM.      Final   DG Foot Complete Left CLINICAL DATA:  Foot pain, redness and swelling.  EXAM: LEFT FOOT - COMPLETE 3+ VIEW  COMPARISON:  01/20/2022  FINDINGS: No acute fracture. Stable alignment. Moderate midfoot dorsal spurring. Mild tibial talar spurring. Large plantar calcaneal spur and Achilles tendon enthesophyte. No erosion or bone destruction. Generalized soft tissue edema similar to prior exam. Prominent vascular calcifications. No soft tissue gas or radiopaque foreign body.  IMPRESSION: 1. Generalized soft tissue edema similar to prior exam. No soft tissue gas or radiopaque foreign body. 2. No acute osseous findings. Midfoot and ankle joint osteoarthritis. 3. Plantar calcaneal spur and Achilles tendon enthesophyte.  Electronically Signed   By: Keith Rake M.D.   On: 02/08/2022 17:45 DG Foot Complete Right CLINICAL DATA:  Foot pain, redness and swelling.  EXAM: RIGHT FOOT COMPLETE - 3+ VIEW  COMPARISON:  Radiograph 01/19/2022  FINDINGS: Medial malleolar and distal fibular hardware partially visualized. Possible lucency about the medial malleolar screw, seen only on the oblique view. Degenerative spurring of the midfoot. Tibial talar osteoarthritis. Findings are similar to prior exam. Unchanged plantar calcaneal spur and Achilles tendon enthesophyte. No fracture. No erosive change or bony destruction. Generalized soft tissue edema, similar. No soft tissue gas or radiopaque foreign body.  IMPRESSION: 1. Generalized soft tissue edema, similar to prior. No soft tissue gas or radiopaque foreign body. 2. 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 No acute osseous findings. 3. Surgical  hardware in the distal tibia and fibula. Possible but not definite lucency about medial malleolar screw which may represent loosening or infection.  Electronically Signed   By: Keith Rake M.D.   On: 02/08/2022 17:43 DG Chest 2 View CLINICAL DATA:  Shortness of breath  EXAM: CHEST - 2 VIEW  COMPARISON:  06/06/2018  FINDINGS: The heart size and mediastinal contours are within normal limits. No focal airspace consolidation, pleural effusion, or pneumothorax. The visualized skeletal structures are unremarkable.  IMPRESSION: No active cardiopulmonary disease.  Electronically Signed   By: Davina Poke D.O.   On: 02/08/2022 11:49    Family Communication: none at bedside  Disposition: Status is: Although care has crossed 2 midnights, there is no medical intensity for inpatient level care.  Thereby continue observation.  Patient is medically optimized for DC to SNF pending bed.  TOC consulted.  Planned Discharge Destination: pending placement Skilled nursing facility DVT prophylaxis: None.  On Eliquis.  Time spent: >30 minutes  Geradine Girt, DO,    To contact the attending provider between 7A-7P or the covering provider during after hours 7P-7A, please log into the web site [email or web address] and access using universal [place] password for that web site. If you do not have the password, please call the hospital operator.  For on call review [email or web address].

## 2022-02-13 NOTE — Progress Notes (Signed)
ANTICOAGULATION CONSULT NOTE -follow up  Pharmacy Consult for apixaban Indication: DVT and possible pulmonary embolus  Allergies  Allergen Reactions   Codeine Nausea And Vomiting    Patient Measurements: Height: '5\' 2"'$  (157.5 cm) Weight: 99.8 kg (220 lb) IBW/kg (Calculated) : 50.1  Vital Signs: Temp: 98.5 F (36.9 C) (07/21 1211) Temp Source: Oral (07/21 1211) BP: 129/83 (07/21 1211) Pulse Rate: 84 (07/21 1211)  Labs: Recent Labs    02/11/22 0248 02/12/22 0155  HGB  --  10.6*  HCT  --  30.7*  PLT  --  402*  CREATININE 1.16* 1.02*     Estimated Creatinine Clearance: 60.8 mL/min (A) (by C-G formula based on SCr of 1.02 mg/dL (H)).   Medical History: Past Medical History:  Diagnosis Date   Anxiety    Bell's palsy 01/08/2011   Chronic back pain    Depression    Diabetes mellitus    Diverticulosis    GERD (gastroesophageal reflux disease)    Hemorrhoids    Hepatomegaly    Hypertension    Lumbar stenosis    L2/L3--followed by Dr. Ernestina Patches   Melanoma [facility]) 2009   Dr [place] Crane, Stage 2, required only surgery   NAFLD (nonalcoholic fatty liver disease)    Right thalamic stroke ([place]) 01/14/2022   Skin cancer    Stroke ([place]) 07/2021     Assessment: Shelby Morgan is a 66 y.o. female with recent admission for acute CVA on 6/13 and received thrombolytics at that time. DVT study positive for DVT in the left with some chronic features. CTA PE study was negative for PE but due to timing was limited for distal evaluation.  Patient not on anticoagulation prior to admission.   Chronic DVT/possible PE, recent stroke. Now on apixaban continues on '10mg'$  bid and will reduce on 7/23 PM to '5mg'$  po BID.  Hgb low/stable in 10s , pltc  wnl >400s. No bleeding noted.   The current 30 day co-pay for Eliquis is, $47.00.  Plavix discontinued while on Eliquis.   Goal of Therapy:  Therapeutic anticoagulation Monitor platelets by anticoagulation protocol: Yes   Plan:  Apixaban 10 mg PO BID  x 7 days  (Day #6) then on 7/23 PM reduce to Apixaban 5 mg PO BID Pharmacy will sign off.    Thank you for allowing [place] to participate in this patients care. Nicole Cella, RPh Clinical Pharmacist 02/13/2022 2:25 PM  **Pharmacist phone directory can be found on [email or web address] listed under [place]**

## 2022-02-13 NOTE — TOC Progression Note (Signed)
Transition of Care [facility]) - Progression Note    Patient Details  Name: HERMELA HARDT MRN: [id number] Date of Birth: 14-Nov-1955  Transition of Care [facility]) CM/SW [place], [place] Phone Number: 02/13/2022, 1:31 PM  Clinical Narrative:     Informed by SNF- [facility] has denied and requested a peer to peer- [phone], option 2 with patient's subscribers # [phone]  MD updated  Expected Discharge Plan: [place] Barriers to Discharge: Ship broker, [place] ([place])  Expected Discharge Plan and Services Expected Discharge Plan: [place] In-house Referral: Clinical Social Work Discharge Planning Services: CM Consult Post Acute Care Choice: [place], [place] Living arrangements for the past 2 months: Single Family Home (was staying w/ 90yrold mother, however lives alone in apt) Expected Discharge Date: 02/10/22               DME Arranged: 3-N-1, [employer], multimedia                    Social Determinants of Health (SDOH) Interventions    Readmission Risk Interventions     No data to display

## 2022-02-13 NOTE — Plan of Care (Signed)

## 2022-02-13 NOTE — Progress Notes (Signed)
Physical Therapy Treatment Patient Details Name: Shelby Morgan MRN: [id number] DOB: [birth year] Today's Date: 02/13/2022   History of Present Illness 66 y/o female admitted 7/16 with bil LE edema, LLE DVT and LLE gout. PMhx: CVA (2022 and june 2023), Bells palsy, HLD, DM, HTN, melanoma, anxiety    PT Comments    Pt with continued progress this session with noted improvement in gait speed and mechanics. Pt demonstrating ambulation in hall with min guard for safety with short distance of step to pattern progressing to more fluid step through pattern with light cues for heel strike on L and increased step length. Pt with slight LOB with head turns but able to self correct in all instances.  Pt continues to fatigue quickly, sitting to complete ADLs at sink on return to room. Short bout of ambulation in room without AD with pt endorsing feelings of increased instability and distrust of LLE strength. Continued to encourage mobility throughout day with exercise as well as elevation of BLE for edema management. Current plan remains appropriate to address deficits and maximize functional independence and decrease caregiver burden. Pt continues to benefit from skilled PT services to progress toward functional mobility goals.    Recommendations for follow up therapy are one component of a multi-disciplinary discharge planning process, led by the attending physician.  Recommendations may be updated based on patient status, additional functional criteria and insurance authorization.  Follow Up Recommendations  Skilled nursing-short term rehab (<3 hours/day)     Assistance Recommended at Discharge Intermittent Supervision/Assistance  Patient can return home with the following A little help with walking and/or transfers;A little help with bathing/dressing/bathroom;Assistance with cooking/housework;Assist for transportation;Help with stairs or ramp for entrance   Equipment Recommendations  BSC/3in1     Recommendations for Other Services       Precautions / Restrictions Precautions Precautions: Fall;Other (comment) Precaution Comments: very mild L hemiparesis with impaired sensation     Mobility  Bed Mobility Overal bed mobility: Needs Assistance Bed Mobility: Supine to Sit     Supine to sit: Supervision     General bed mobility comments: supervision for safet    Transfers Overall transfer level: Needs assistance Equipment used: Rolling walker (2 wheels) Transfers: Sit to/from Stand Sit to Stand: Min guard           General transfer comment: min guard from various height surfaces    Ambulation/Gait Ambulation/Gait assistance: Min guard Gait Distance (Feet): 125 Feet Assistive device: Rolling walker (2 wheels) Gait Pattern/deviations: Step-through pattern, Decreased stride length, Trunk flexed, Step-to pattern Gait velocity: improved     General Gait Details: slow steady gaitw ith RW, step to progressing to step through pattern with increased distance, slight instability when turning head, pt able to self correct. 5' trial of no AD with wide BOS and pt endorsing anxiety and feeling much les stable and not trusting LLE strength   Stairs             Wheelchair Mobility    Modified Rankin (Stroke Patients Only)       Balance Overall balance assessment: Needs assistance   Sitting balance-Leahy Scale: Fair     Standing balance support: Bilateral upper extremity supported, Reliant on assistive device for balance Standing balance-Leahy Scale: Poor Standing balance comment: RW or holding onto sink in standing                            Cognition Arousal/Alertness: Awake/alert Behavior During  Therapy: WFL for tasks assessed/performed Overall Cognitive Status: Within Functional Limits for tasks assessed                                          Exercises      General Comments General comments (skin integrity, edema,  etc.): HR sustained in low 100's during activity, continued to encourage mobility throughout day      Pertinent Vitals/Pain Pain Assessment Pain Assessment: 0-10 Pain Score: 2  Pain Location: R ankle Pain Descriptors / Indicators: Aching, Guarding Pain Intervention(s): Monitored during session, Limited activity within patient's tolerance, Repositioned    Home Living                          Prior Function            PT Goals (current goals can now be found in the care plan section) Acute Rehab PT Goals Patient Stated Goal: get stronger PT Goal Formulation: With patient Time For Goal Achievement: 02/24/22    Frequency    Min 3X/week      PT Plan      Co-evaluation              AM-PAC PT "6 Clicks" Mobility   Outcome Measure  Help needed turning from your back to your side while in a flat bed without using bedrails?: None Help needed moving from lying on your back to sitting on the side of a flat bed without using bedrails?: None Help needed moving to and from a bed to a chair (including a wheelchair)?: A Little Help needed standing up from a chair using your arms (e.g., wheelchair or bedside chair)?: A Little Help needed to walk in hospital room?: A Little Help needed climbing 3-5 steps with a railing? : A Lot 6 Click Score: 19    End of Session Equipment Utilized During Treatment: Gait belt Activity Tolerance: Patient tolerated treatment well Patient left: in chair;with call bell/phone within reach Nurse Communication: Mobility status PT Visit Diagnosis: Other abnormalities of gait and mobility (R26.89);Difficulty in walking, not elsewhere classified (R26.2);Muscle weakness (generalized) (M62.81);Pain Pain - Right/Left: Left Pain - part of body: Leg     Time: 0812-0830 PT Time Calculation (min) (ACUTE ONLY): 18 min  Charges:  $Therapeutic Activity: 8-22 mins                     Zoey Bidwell R. PTA Acute Rehabilitation Services Office:  [place] 02/13/2022, 8:41 AM

## 2022-02-14 DIAGNOSIS — K760 Fatty (change of) liver, not elsewhere classified: Secondary | ICD-10-CM | POA: Diagnosis not present

## 2022-02-14 DIAGNOSIS — R5381 Other malaise: Secondary | ICD-10-CM | POA: Diagnosis not present

## 2022-02-14 DIAGNOSIS — I10 Essential (primary) hypertension: Secondary | ICD-10-CM | POA: Diagnosis not present

## 2022-02-14 DIAGNOSIS — E785 Hyperlipidemia, unspecified: Secondary | ICD-10-CM | POA: Diagnosis not present

## 2022-02-14 DIAGNOSIS — E669 Obesity, unspecified: Secondary | ICD-10-CM | POA: Diagnosis not present

## 2022-02-14 DIAGNOSIS — I82452 Acute embolism and thrombosis of left peroneal vein: Secondary | ICD-10-CM

## 2022-02-14 DIAGNOSIS — K219 Gastro-esophageal reflux disease without esophagitis: Secondary | ICD-10-CM | POA: Diagnosis not present

## 2022-02-14 DIAGNOSIS — Z8673 Personal history of transient ischemic attack (TIA), and cerebral infarction without residual deficits: Secondary | ICD-10-CM | POA: Diagnosis not present

## 2022-02-14 DIAGNOSIS — I82402 Acute embolism and thrombosis of unspecified deep veins of left lower extremity: Secondary | ICD-10-CM | POA: Diagnosis not present

## 2022-02-14 DIAGNOSIS — E1169 Type 2 diabetes mellitus with other specified complication: Secondary | ICD-10-CM | POA: Diagnosis not present

## 2022-02-14 LAB — GLUCOSE, CAPILLARY
Glucose-Capillary: 144 mg/dL — ABNORMAL HIGH (ref 70–99)
Glucose-Capillary: 129 mg/dL — ABNORMAL HIGH (ref 70–99)
Glucose-Capillary: 190 mg/dL — ABNORMAL HIGH (ref 70–99)
Glucose-Capillary: 122 mg/dL — ABNORMAL HIGH (ref 70–99)

## 2022-02-14 MED ORDER — AMLODIPINE BESYLATE 5 MG PO TABS
5.0000 mg | ORAL_TABLET | Freq: Every day | ORAL | Status: DC
  Administered 2022-02-14 – 2022-02-16 (×3): 5 mg via ORAL
  Filled 2022-02-14 (×3): qty 1

## 2022-02-14 NOTE — Progress Notes (Signed)
Mobility Specialist Progress Note    02/14/22 1441  Mobility  Activity Ambulated with assistance in hallway  Level of Assistance Contact guard assist, steadying assist  Assistive Device Front wheel walker  LLE Weight Bearing WBAT  Distance Ambulated (ft) 390 ft  Activity Response Tolerated well  $Mobility charge 1 Mobility   Pre-Mobility: 98 HR Post-Mobility: 124 HR  Pt received in chair and agreeable. No complaints on walk. Returned to chair with call bell in reach.    Hildred Alamin Mobility Specialist

## 2022-02-14 NOTE — Progress Notes (Signed)
Pt Bp 180/92 @ 1752, 179/95 '@1930'$ . Pt denies pain. A. Kakrakandy,MD notified. Awaiting orders. RN will continue to monitor pt.  1941 Orders received. Medication administered. RN will continue to monitor pt.

## 2022-02-14 NOTE — Progress Notes (Signed)
PROGRESS NOTE    Shelby Morgan  ZOX:[id number] DOB: 1955-09-04 DOA: 02/08/2022 PCP: Cory Munch, PA-C    Chief Complaint  Patient presents with   Foot Swelling    Brief Narrative:  66 year old female with PMH of HTN, HL, DM2, 03/2021 and 12/2021 CVAs (on Plavix) who presented to the ED on 7/16 with left leg pain found to have a DVT in the left peroneal vein, left foot gout, and a mild AKI.  CTA of the chest showed no large PE but had limited evaluation of the distal arteries.  Patient's discharge is pending SNF placement.  TOC consulted.  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 -2D echo done approximately a month ago was negative for any right heart strain. -Currently on Eliquis 10 mg twice daily x7 days through 02/14/2022 followed by 5 mg twice daily started on 02/15/2022 with a duration of 3 to 6 months. -It is noted that although DVT was reported as chronic and distal usually low likelihood of PE propagation however due to history of recent CVA, left-sided hemiparesis/sensory deficits with diminished mobility patient with increased thrombotic risk and duration of DVT not exactly known. -Continue current anticoagulation with Eliquis. -Patient noted to have been admitted for an acute stroke and left-sided hemiparesis on 01/06/2022. -Early on during the hospitalization.  Neurology is  noted that patient had 3/5 power on the left side and likely possible that she may have developed DVT during that period of time with left-sided relative immobility. -Patient with no reported bleeding. -Plavix discontinued as patient placed on Eliquis and may be resumed after Eliquis duration has been completed for secondary stroke prophylaxis. -It is noted that Dr. Algis Liming discussed in detail with both patient and patient's son via telephone updated all above in great detail, answered all their questions, advice regarding continuation of Eliquis given benefits outweigh risk of bleeding and both patient and son were agreeable.  2.  Acute on chronic gout of the left foot -Status post course of steroids.  3.  Mild lower extremity edema -Improved and swelling felt likely secondary to DVT. -Status post IV Lasix 20 mg x 1 7/18 followed by resumption of home dose of oral Lasix 20 mg daily.  4.  AKI on CKD stage II -Resolved.  5.  Diabetes mellitus type 2 -Hemoglobin A1c 7.1 (01/07/2022) -CBG 144 this morning. -Continue current dose of Semglee 5 units daily, NovoLog 5 units 3 times daily with meals, SSI, Neurontin. -Outpatient follow-up.  6.  History of CVA with mild residual left deficits (2022, 2023) -Plavix discontinued as patient placed on Eliquis. -May resume Plavix once Eliquis course has been completed. -Patient with ongoing left-sided numbness and tingling. -Continue statin. -PT/OT. -Outpatient follow-up with neurology.  7.  Hypertension -Patient noted to have elevated blood pressure overnight. -Continue home regimen Lasix. -Start Norvasc 5 mg daily and  uptitrate as needed for better blood pressure control. -IV hydralazine as needed.  8.  Hyperlipidemia -Statin.  9.  GERD -PPI.  10.  OSA -CPAP nightly.  11.  Obesity -Lifestyle modification. -Outpatient follow-up with PCP.  12.  Deconditioning/debility -Patient noted to be using a walker at baseline. -Prior to recent  stroke and recent CIR admission patient noted to be living independently at home. -Following discharge from CIR noted to be living with her elderly mother who is no longer able to provide a level of care that patient needs. -PT OT assessed patient recommending SNF. -Awaiting SNF placement.  13.  Right ankle surgery/fatty liver disease/possible liver cirrhosis on CTA chest -Outpatient follow-up.      DVT prophylaxis: Eliquis Code Status: Full Family Communication: Updated patient.  No family at bedside. Disposition: SNF  Status is: Observation The patient remains OBS appropriate and will d/c before 2 midnights.   Consultants:  None  Procedures:  CT angiogram chest 02/08/2022 Plain films of bilateral feet 02/08/2022 Bilateral lower extremity Dopplers 02/08/2022    Antimicrobials:  None   Subjective: Sitting up in chair.  No chest pain.  No shortness of breath.  No abdominal pain.  States still with left-sided numbness in upper and lower extremities since last stroke.  Feels swelling in lower extremity has improved.  Patient states blood pressure was elevated overnight.  Objective: Vitals:   02/14/22 0042 02/14/22 0401 02/14/22 0940 02/14/22 1104  BP: (!) 152/87 (!) 160/88 (!) 147/81 (!) 148/90  Pulse: 71 75 82 88  Resp: '16 18 18 17  '$ Temp: 98.7 F (37.1 C) 98.6 F (37 C) 98.6 F (37 C) 99.3 F (37.4 C)  TempSrc: Oral Oral Oral Oral  SpO2: 99% 96% 95% 96%  Weight:      Height:        Intake/Output Summary (Last 24 hours) at 02/14/2022 1217 Last data filed at 02/14/2022 1106 Gross per 24 hour  Intake 480 ml  Output 2000 ml  Net -1520 ml   Filed Weights   02/08/22 1953 02/09/22 2032  Weight: 99 kg 99.8 kg    Examination:  General exam: Appears calm and comfortable  Respiratory system: Clear to auscultation. Respiratory effort normal. Cardiovascular system: S1 & S2 heard, RRR. No JVD, murmurs, rubs, gallops or clicks. No pedal edema. Gastrointestinal system:  Abdomen is nondistended, soft and nontender. No organomegaly or masses felt. Normal bowel sounds heard. Central nervous system: Alert and oriented. No focal neurological deficits. Extremities: Symmetric 5 x 5 power. Skin: No rashes, lesions or ulcers Psychiatry: Judgement and insight appear normal. Mood & affect appropriate.     Data Reviewed: I have personally reviewed following labs and imaging studies  CBC: Recent Labs  Lab 02/08/22 1114 02/09/22 0601 02/10/22 0330 02/12/22 0155  WBC 11.0* 8.8 9.1 9.2  NEUTROABS 7.5  --   --   --   HGB 11.9* 10.4* 10.2* 10.6*  HCT 36.5 31.8* 31.0* 30.7*  MCV 84.1 86.2 83.3 81.2  PLT 337 299 303 402*    Basic Metabolic Panel: Recent Labs  Lab 02/08/22 1114 02/09/22 0601 02/10/22 0330 02/11/22 0248 02/12/22 0155  NA 137 136 139 138 137  K 3.4* 4.7 3.8 4.0 4.1  CL 95* 103 102 100 102  CO2 '29 27 24 27 26  '$ GLUCOSE 105* 151* 147* 166* 206*  BUN '13 12 16 19 23  '$ CREATININE 1.36* 1.24* 1.07* 1.16* 1.02*  CALCIUM 9.4 8.9 9.1 9.2 9.1  MG  --  2.3  --   --   --  GFR: Estimated Creatinine Clearance: 60.8 mL/min (A) (by C-G formula based on SCr of 1.02 mg/dL (H)).  Liver Function Tests: Recent Labs  Lab 02/08/22 1114  AST 16  ALT 19  ALKPHOS 51  BILITOT 0.9  PROT 8.2*  ALBUMIN 3.6    CBG: Recent Labs  Lab 02/13/22 1207 02/13/22 1749 02/13/22 2119 02/14/22 0601 02/14/22 1058  GLUCAP 164* 230* 209* 144* 129*     No results found for this or any previous visit (from the past 240 hour(s)).       Radiology Studies: No results found.      Scheduled Meds:  amLODipine  5 mg Oral Daily   apixaban  10 mg Oral BID   Followed by   Derrill Memo ON 02/15/2022] apixaban  5 mg Oral BID   busPIRone  5 mg Oral BID   furosemide  20 mg Oral Daily   gabapentin  100 mg Oral TID   insulin aspart  0-15 Units Subcutaneous TID WC   insulin aspart  5 Units Subcutaneous TID WC   insulin glargine-yfgn  5 Units Subcutaneous QHS    magnesium oxide  400 mg Oral Daily   pantoprazole  40 mg Oral Daily   rosuvastatin  20 mg Oral Daily   sodium chloride flush  3 mL Intravenous Q12H   Continuous Infusions:   LOS: 0 days    Time spent: 35 minutes    Irine Seal, MD [facility]   To contact the attending provider between 7A-7P or the covering provider during after hours 7P-7A, please log into the web site [email or web address] and access using universal [place] password for that web site. If you do not have the password, please call the hospital operator.  02/14/2022, 12:17 PM

## 2022-02-15 DIAGNOSIS — K760 Fatty (change of) liver, not elsewhere classified: Secondary | ICD-10-CM | POA: Diagnosis not present

## 2022-02-15 DIAGNOSIS — E1169 Type 2 diabetes mellitus with other specified complication: Secondary | ICD-10-CM | POA: Diagnosis not present

## 2022-02-15 DIAGNOSIS — I82452 Acute embolism and thrombosis of left peroneal vein: Secondary | ICD-10-CM | POA: Diagnosis not present

## 2022-02-15 DIAGNOSIS — I82402 Acute embolism and thrombosis of unspecified deep veins of left lower extremity: Secondary | ICD-10-CM | POA: Diagnosis not present

## 2022-02-15 DIAGNOSIS — E669 Obesity, unspecified: Secondary | ICD-10-CM | POA: Diagnosis not present

## 2022-02-15 DIAGNOSIS — R5381 Other malaise: Secondary | ICD-10-CM | POA: Diagnosis not present

## 2022-02-15 DIAGNOSIS — E785 Hyperlipidemia, unspecified: Secondary | ICD-10-CM | POA: Diagnosis not present

## 2022-02-15 DIAGNOSIS — K219 Gastro-esophageal reflux disease without esophagitis: Secondary | ICD-10-CM | POA: Diagnosis not present

## 2022-02-15 DIAGNOSIS — Z8673 Personal history of transient ischemic attack (TIA), and cerebral infarction without residual deficits: Secondary | ICD-10-CM | POA: Diagnosis not present

## 2022-02-15 DIAGNOSIS — I10 Essential (primary) hypertension: Secondary | ICD-10-CM | POA: Diagnosis not present

## 2022-02-15 LAB — BASIC METABOLIC PANEL
Anion gap: 8 (ref 5–15)
BUN: 24 mg/dL — ABNORMAL HIGH (ref 8–23)
CO2: 29 mmol/L (ref 22–32)
Calcium: 9 mg/dL (ref 8.9–10.3)
Chloride: 100 mmol/L (ref 98–111)
Creatinine, Ser: 1.25 mg/dL — ABNORMAL HIGH (ref 0.44–1.00)
GFR, Estimated: 48 mL/min — ABNORMAL LOW (ref >60)
Glucose, Bld: 137 mg/dL — ABNORMAL HIGH (ref 70–99)
Potassium: 3.8 mmol/L (ref 3.5–5.1)
Sodium: 137 mmol/L (ref 135–145)

## 2022-02-15 LAB — MAGNESIUM: Magnesium: 1.8 mg/dL (ref 1.7–2.4)

## 2022-02-15 LAB — GLUCOSE, CAPILLARY
Glucose-Capillary: 114 mg/dL — ABNORMAL HIGH (ref 70–99)
Glucose-Capillary: 131 mg/dL — ABNORMAL HIGH (ref 70–99)
Glucose-Capillary: 179 mg/dL — ABNORMAL HIGH (ref 70–99)
Glucose-Capillary: 130 mg/dL — ABNORMAL HIGH (ref 70–99)

## 2022-02-15 MED ORDER — MAGNESIUM SULFATE 2 GM/50ML IV SOLN
2.0000 g | Freq: Once | INTRAVENOUS | Status: AC
Start: 2022-02-15 — End: 2022-02-15
  Administered 2022-02-15: 2 g via INTRAVENOUS
  Filled 2022-02-15: qty 50

## 2022-02-15 NOTE — Progress Notes (Signed)
PROGRESS NOTE    Shelby Morgan  CVE:[id number] DOB: 1956/07/17 DOA: 02/08/2022 PCP: Cory Munch, PA-C    Chief Complaint  Patient presents with   Foot Swelling    Brief Narrative:  66 year old female with PMH of HTN, HL, DM2, 03/2021 and 12/2021 CVAs (on Plavix) who presented to the ED on 7/16 with left leg pain found to have a DVT in the left peroneal vein, left foot gout, and a mild AKI.  CTA of the chest showed no large PE but had limited evaluation of the distal arteries.  Patient's discharge is pending SNF placement.  TOC consulted.  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 -2D echo done approximately a month ago was negative for any right heart strain. -Currently on Eliquis 10 mg twice daily x7 days through 02/14/2022 followed by 5 mg twice daily to be started tonight 02/15/2022 with a duration of 3 to 6 months. -It is noted that although DVT was reported as chronic and distal usually low likelihood of PE propagation however due to history of recent CVA, left-sided hemiparesis/sensory deficits with diminished mobility patient with increased thrombotic risk and duration of DVT not exactly known. -Continue current anticoagulation with Eliquis. -Patient noted to have been admitted for an acute stroke and left-sided hemiparesis on 01/06/2022. -Early on  during the hospitalization.  Neurology is noted that patient had 3/5 power on the left side and likely possible that she may have developed DVT during that period of time with left-sided relative immobility. -Patient with no reported bleeding. -Plavix discontinued as patient placed on Eliquis and may be resumed after Eliquis duration has been completed for secondary stroke prophylaxis. -It is noted that Dr. Algis Liming discussed in detail with both patient and patient's son via telephone updated all above in great detail, answered all their questions, advice regarding continuation of Eliquis given benefits outweigh risk of bleeding and both patient and son were agreeable.  2.  Acute on chronic gout of the left foot -Status post course of steroids.  3.  Mild lower extremity edema -Improved and swelling felt likely secondary to DVT. -Status post IV Lasix 20 mg x 1 7/18 followed by resumption of home dose of oral Lasix 20 mg daily.  4.  AKI on CKD stage II -Resolved.  5.  Diabetes mellitus type 2 -Hemoglobin A1c 7.1 (01/07/2022) -CBG 114 this morning. -Continue current dose of Semglee 5 units daily, NovoLog 5 units 3 times daily with meals, SSI, Neurontin. -Outpatient follow-up.  6.  History of CVA with mild residual left deficits (2022, 2023) -Plavix discontinued as patient placed on Eliquis. -May resume Plavix once Eliquis course has been completed. -Patient with ongoing left-sided numbness and tingling. -Continue statin. -PT/OT. -Outpatient follow-up with neurology.  7.  Hypertension -Patient noted to have elevated blood pressure the evening of 02/13/2022.  -  Norvasc 5 mg daily started yesterday with improvement with blood pressure.   -Continue home regimen Lasix.   -IV hydralazine as needed.   8.  Hyperlipidemia -Statin.  9.  GERD -Continue PPI.   10.  OSA -CPAP nightly.  11.  Obesity -Lifestyle modification. -Outpatient follow-up with PCP.  12.   Deconditioning/debility -Patient noted to be using a walker at baseline. -Prior to recent stroke and recent CIR admission patient noted to be living independently at home. -Following discharge from CIR noted to be living with her elderly mother who is no longer able to provide a level of care that patient needs. -PT/ OT assessed patient recommending SNF. -Per RN patient with improvement with ambulation and as such we will have PT/OT reassess. -Awaiting SNF placement versus home with home health pending PT reassessment.  13.  Right ankle surgery/fatty liver disease/possible liver cirrhosis on CTA chest -Outpatient follow-up.      DVT prophylaxis: Eliquis Code Status: Full Family Communication: Updated patient.  No family at bedside. Disposition: Likely home with home health versus SNF in 24 hours  Status is: Observation The patient remains OBS appropriate and will d/c before 2 midnights.   Consultants:  None  Procedures:  CT angiogram chest 02/08/2022 Plain films of bilateral feet 02/08/2022 Bilateral lower extremity Dopplers 02/08/2022    Antimicrobials:  None   Subjective: Sitting up in chair.  No chest pain.  No shortness of breath.  Feels lower extremity swelling has improved significantly.  Noted to have some itching area on her sacral region.    Objective: Vitals:   02/14/22 1628 02/14/22 2105 02/15/22 0111 02/15/22 0350  BP: (!) 142/86 118/69 105/88 122/71  Pulse: 93 79 79 73  Resp: '18 18 19 18  '$ Temp: 98.9 F (37.2 C) 97.8 F (36.6 C) 98.9 F (37.2 C) 97.9 F (36.6 C)  TempSrc: Oral Oral Oral Oral  SpO2: 94% 97% 96% 96%  Weight:      Height:        Intake/Output Summary (Last 24 hours) at 02/15/2022 1037 Last data filed at 02/15/2022 0910 Gross per 24 hour  Intake --  Output 2000 ml  Net -2000 ml    Filed Weights   02/08/22 1953 02/09/22 2032  Weight: 99 kg 99.8 kg    Examination:  General exam: NAD Respiratory system: CTA B.  No wheezes, no  crackles, no rhonchi.  Fair air movement.  Speaking in full sentences.   Cardiovascular system: Regular rate rhythm no murmurs rubs or gallops.  No JVD.  No lower extremity edema. Gastrointestinal system: Abdomen is soft, nontender, nondistended, positive bowel sounds.  No rebound.  No guarding. Central nervous system: Alert and oriented. No focal neurological deficits. Extremities: Symmetric 5 x 5 power. Skin: 2 small quarter size areas noted on sacral region, erythematous, nontender to palpation.  Psychiatry: Judgement and insight appear normal. Mood & affect appropriate.     Data Reviewed: I have personally reviewed following labs and imaging studies  CBC: Recent Labs  Lab 02/08/22 1114 02/09/22 0601 02/10/22 0330 02/12/22 0155  WBC 11.0* 8.8 9.1 9.2  NEUTROABS 7.5  --   --   --   HGB 11.9* 10.4* 10.2* 10.6*  HCT 36.5 31.8* 31.0* 30.7*  MCV 84.1 86.2 83.3 81.2  PLT 337 299 303 402*     Basic Metabolic Panel: Recent Labs  Lab 02/09/22 0601 02/10/22 0330 02/11/22 0248 02/12/22 0155 02/15/22 0116  NA 136 139 138 137 137  K 4.7 3.8 4.0  4.1 3.8  CL 103 102 100 102 100  CO2 '27 24 27 26 29  '$ GLUCOSE 151* 147* 166* 206* 137*  BUN '12 16 19 23 '$ 24*  CREATININE 1.24* 1.07* 1.16* 1.02* 1.25*  CALCIUM 8.9 9.1 9.2 9.1 9.0  MG 2.3  --   --   --  1.8     GFR: Estimated Creatinine Clearance: 49.6 mL/min (A) (by C-G formula based on SCr of 1.25 mg/dL (H)).  Liver Function Tests: Recent Labs  Lab 02/08/22 1114  AST 16  ALT 19  ALKPHOS 51  BILITOT 0.9  PROT 8.2*  ALBUMIN 3.6     CBG: Recent Labs  Lab 02/14/22 0601 02/14/22 1058 02/14/22 1624 02/14/22 2110 02/15/22 0614  GLUCAP 144* 129* 190* 122* 114*      No results found for this or any previous visit (from the past 240 hour(s)).       Radiology Studies: No results found.      Scheduled Meds:  amLODipine  5 mg Oral Daily   apixaban  5 mg Oral BID   busPIRone  5 mg Oral BID   furosemide  20  mg Oral Daily   gabapentin  100 mg Oral TID   insulin aspart  0-15 Units Subcutaneous TID WC   insulin aspart  5 Units Subcutaneous TID WC   insulin glargine-yfgn  5 Units Subcutaneous QHS   magnesium oxide  400 mg Oral Daily   pantoprazole  40 mg Oral Daily   rosuvastatin  20 mg Oral Daily   sodium chloride flush  3 mL Intravenous Q12H   Continuous Infusions:  magnesium sulfate bolus IVPB       LOS: 0 days    Time spent: 35 minutes    Irine Seal, MD [facility]   To contact the attending provider between 7A-7P or the covering provider during after hours 7P-7A, please log into the web site [email or web address] and access using universal [place] password for that web site. If you do not have the password, please call the hospital operator.  02/15/2022, 10:37 AM

## 2022-02-15 NOTE — Progress Notes (Addendum)
Pt states that she is getting a pressure sore on her bottom. Assessed area and two small quarter sized areas with possible pustule inside. She states it hurts and itches. I placed foam over site and instructed her to leave it alone. Site is blanchable. Had second RN Erasmo Downer assess. Will make Dr. Grandville Silos aware. Payton Emerald, RN  Pt aware that spots are not pressure sores and that we will continue to monitor. Replaced foam.

## 2022-02-15 NOTE — TOC Progression Note (Addendum)
Transition of Care [facility]) - Progression Note    Patient Details  Name: Shelby Morgan MRN: [id number] Date of Birth: 1955-10-25  Transition of Care [facility]) CM/SW [place], LCSW Phone Number: 02/15/2022, 7:59 AM  Clinical Narrative:    CSW spoke with Debbie from [facility] and asked if she had anything else about pt's auth. Jackelyn Poling stated that she had not and that a P2P should have happened yesterday due to denial.  11:45 AM- CSW spoke with MD Grandville Silos and asked if he knew if the P2P occurred and he stated he did not.  TOC team will continue to assist with discharge planning needs.   Expected Discharge Plan: [place] Barriers to Discharge: Ship broker, [place] ([place])  Expected Discharge Plan and Services Expected Discharge Plan: [place] In-house Referral: Clinical Social Work Discharge Planning Services: CM Consult Post Acute Care Choice: [place], [place] Living arrangements for the past 2 months: Single Family Home (was staying w/ 5yrold mother, however lives alone in apt) Expected Discharge Date: 02/10/22               DME Arranged: 3-N-1, [employer], [employer]                    Social Determinants of Health (SDOH) Interventions    Readmission Risk Interventions     No data to display

## 2022-02-15 NOTE — Progress Notes (Signed)
Attempted to page PT to reevaluate patient for home health. Pt aware that PT will see her again. Pt ambulated to bathroom with walker and bathed with minimal assist at sink. Pt states she already has equipment at home. Pt resting with call bell within reach.  Will continue to monitor.

## 2022-02-16 DIAGNOSIS — R7989 Other specified abnormal findings of blood chemistry: Secondary | ICD-10-CM | POA: Diagnosis not present

## 2022-02-16 DIAGNOSIS — I82452 Acute embolism and thrombosis of left peroneal vein: Secondary | ICD-10-CM | POA: Diagnosis not present

## 2022-02-16 DIAGNOSIS — E1169 Type 2 diabetes mellitus with other specified complication: Secondary | ICD-10-CM | POA: Diagnosis not present

## 2022-02-16 DIAGNOSIS — Z8673 Personal history of transient ischemic attack (TIA), and cerebral infarction without residual deficits: Secondary | ICD-10-CM | POA: Diagnosis not present

## 2022-02-16 DIAGNOSIS — E669 Obesity, unspecified: Secondary | ICD-10-CM | POA: Diagnosis not present

## 2022-02-16 DIAGNOSIS — K760 Fatty (change of) liver, not elsewhere classified: Secondary | ICD-10-CM | POA: Diagnosis not present

## 2022-02-16 LAB — GLUCOSE, CAPILLARY
Glucose-Capillary: 138 mg/dL — ABNORMAL HIGH (ref 70–99)
Glucose-Capillary: 178 mg/dL — ABNORMAL HIGH (ref 70–99)
Glucose-Capillary: 185 mg/dL — ABNORMAL HIGH (ref 70–99)

## 2022-02-16 LAB — MAGNESIUM: Magnesium: 2.4 mg/dL (ref 1.7–2.4)

## 2022-02-16 MED ORDER — AMLODIPINE BESYLATE 5 MG PO TABS: 5.0000 mg | ORAL_TABLET | Freq: Every day | ORAL | 3 refills | Status: DC

## 2022-02-16 MED ORDER — VALACYCLOVIR HCL 500 MG PO TABS
1000.0000 mg | ORAL_TABLET | Freq: Three times a day (TID) | ORAL | Status: DC
  Administered 2022-02-16 (×2): 1000 mg via ORAL
  Filled 2022-02-16 (×2): qty 2

## 2022-02-16 MED ORDER — VALACYCLOVIR HCL 1 G PO TABS: 1000.0000 mg | ORAL_TABLET | Freq: Three times a day (TID) | ORAL | 0 refills | Status: AC

## 2022-02-16 NOTE — Progress Notes (Signed)
Patient with areas of redness and small pustules on right buttock. Evaluated with Dr. Darrick Meigs at bedside. Patient placed on airborne and contact at this time. Bedside RN updated. Gaynel Schaafsma, Bettina Gavia RN

## 2022-02-16 NOTE — TOC Progression Note (Addendum)
Transition of Care (TOC) - Progression Note  [name] RN, BSN Transitions of Care Unit 4E- RN Case Manager See Treatment Team for direct phone #    Patient Details  Name: Shelby Morgan MRN: [id number] Date of Birth: Nov 22, 1955  Transition of Care [facility]) CM/SW Contact  [name], Romeo Rabon, RN Phone Number: 02/16/2022, 12:21 PM  Clinical Narrative:    Per CSW pt has been denied SNF stay by insurance and P2P was not completed last week as requested.  CM spoke with pt at bedside to discuss transition plans. PT also present during conversation.  Pt has progressed and is now able to ambulate to bathroom and bath herself at the sink with no assistance. Pt reports that she has spoken with her brothers who live nearby and they are going to take turns staying with her at night. She has friends who are going to come help assist some during the day intermittently. She also has friends lined up that will come assist her with cleaning and bathing.  She has discussed this as well with her son who is agreeable she says to this plan.  Pt reports she feels better this week about returning to her home and is mobilizing close to her baseline prior to admit.  Pt was getting ready to start Outpt therapy in [place] and would like to follow up on that plan- she says she is to call them to re-schedule on return home. She was set up at the [address] location-[facility].   Pt reports she has a RW and shower bench, and will plan to get a BSC as well to use at night. Pt states she will get the [facility] on her own as she knows of a store where she got her other DME.   At this point in time pt plans to return to her home w/ support of family and friends- and start outpt PT/OT as previously arranged.   1600- pt has been cleared for d/c- referral sent to [facility]- outpt on [address] -to f/u on referral for outpt PT/OT- pt has already been in contact with them prior to admit and was to start outpt therapy.  PCP- [place] at  [facility]  No further TOC needs noted. Pt to return to her home with assistance that she has arranged from family and friends.    Expected Discharge Plan: OP Rehab Barriers to Discharge: Continued Medical Work up  Expected Discharge Plan and Services Expected Discharge Plan: OP Rehab In-house Referral: Clinical Social Work Discharge Planning Services: CM Consult Post Acute Care Choice: [place], [place] Living arrangements for the past 2 months: Single Family Home (was staying w/ 67yrold mother, however lives alone in apt) Expected Discharge Date: 02/10/22               DME Arranged: 3-N-1 DME Agency: NA (pt states she will get 3n1 on her own)       HH Arranged: NA HH Agency: NA         Social Determinants of Health (SDOH) Interventions    Readmission Risk Interventions     No data to display

## 2022-02-16 NOTE — Discharge Summary (Signed)
Physician Discharge Summary   Patient: Shelby Morgan MRN: [id number] DOB: 1956/07/09  Admit date:     02/08/2022  Discharge date: 02/16/22  Discharge Physician: Oswald Hillock   PCP: Cory Munch, PA-C   Recommendations at discharge:   Follow-up PCP in 2 weeks Continue taking Eliquis 5 mg twice a day for 3 months Stop taking Plavix while you are on Eliquis; Plavix can be restarted after stopping Eliquis in 3 months She will need outpatient evaluation for possible liver cirrhosis seen on CTA chest  Discharge Diagnoses: Principal Problem:   DVT (deep venous thrombosis) (HCC) Active Problems:   Transaminitis   Anxiety   GERD (gastroesophageal reflux disease)   NAFLD (nonalcoholic fatty liver disease)   Obesity   Diabetes mellitus type 2 in obese ([place])   Primary hypertension   Hyperlipidemia associated with type 2 diabetes mellitus ([place])   History of CVA (cerebrovascular accident)   Elevated serum creatinine   Physical deconditioning  Resolved Problems:   * No resolved hospital problems. *  Hospital Course:  66 year old female with PMH of HTN, HL, DM2, 03/2021 and 12/2021 CVAs (on Plavix) who presented to the ED on 7/16 with left leg pain found to have a DVT in the left peroneal vein, left foot gout, and a mild AKI.  CTA of the chest showed no large PE but had limited evaluation of the distal arteries.  Patient's discharge is pending SNF placement.  TOC consulted.  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 -2D echo done approximately a month ago was negative for any right heart strain. -She was started on  Eliquis 10 mg twice daily x7 days through 02/14/2022 followed by 5 mg twice daily to be started tonight 02/15/2022 with a duration of 3 to 6 months. -It is noted that  although DVT was reported as chronic and distal usually low likelihood of PE propagation however due to history of recent CVA, left-sided hemiparesis/sensory deficits with diminished mobility patient with increased thrombotic risk and duration of DVT not exactly known. -Continue current anticoagulation with Eliquis. -Patient noted to have been admitted for an acute stroke and left-sided hemiparesis on 01/06/2022. -Early on during the hospitalization.  Neurology is noted that patient had 3/5 power on the left side and likely possible that she may have developed DVT during that period of time with left-sided relative immobility. -Patient with no reported bleeding. -Plavix discontinued as patient placed on Eliquis and may be resumed after Eliquis duration has been completed for secondary stroke prophylaxis. -It is noted that Dr. Algis Liming discussed in detail with both patient and patient's son via telephone updated all above in great detail, answered all their questions, advice regarding continuation of Eliquis given benefits outweigh risk of bleeding and both patient and son were agreeable. -He has completed 10 mg twice a day Eliquis in the hospital.  We will discharge on Eliquis 5 mg twice a day for at least 3 months.  2.  Acute on chronic gout of the left foot -Status post course of steroids.  3.  Mild lower extremity edema -Improved and swelling felt likely secondary to DVT. -Swelling improved with 1 dose of Lasix; home dose of Lasix 20 mg daily resumed.  4.  AKI on CKD stage II -Resolved.  5.  Diabetes mellitus type 2 -Hemoglobin A1c 7.1 (01/07/2022) -CBG 114 this morning. -Continue current dose of Semglee 5 units daily, NovoLog 5 units 3 times daily with meals, SSI, Neurontin. -Outpatient follow-up.  6.  History of CVA with mild residual left deficits (2022, 2023) -Plavix discontinued as patient placed on Eliquis. -May resume Plavix once Eliquis course has been completed. -Patient with  ongoing left-sided numbness and tingling. -Continue statin. -PT/OT.  As outpatient -Outpatient follow-up with neurology.  7.  Hypertension -Patient noted to have elevated blood pressure the evening of 02/13/2022.  -Norvasc 5 mg daily started yesterday with improvement with blood pressure.   -Continue home regimen Lasix.   - We will discontinue lisinopril  8.  Hyperlipidemia -Statin.  9.  GERD -Continue PPI.   10.  OSA -CPAP nightly.  11.  Obesity -Lifestyle modification. -Outpatient follow-up with PCP.  12.  Deconditioning/debility -Patient noted to be using a walker at baseline. -Prior to recent stroke and recent CIR admission patient noted to be living independently at home. -Following discharge from CIR noted to be living with her elderly mother who is no longer able to provide a level of care that patient needs. -PT/ OT assessed patient; will go home and follow-up with outpatient PT  13.  Right ankle surgery/fatty liver disease/possible liver cirrhosis on CTA chest -Outpatient follow-up.  14.  Herpes zoster -Patient noted to have small area of erythematous rash with vesicles noted on the right side of buttock. -She has history of herpes zoster in the past -Started to itch and burn today; patient started on Valtrex 1 g p.o. 3 times daily for 7 days -Follow-up PCP as outpatient      Consultants:  Procedures performed:  Disposition: Home Diet recommendation:  Discharge Diet Orders (From admission, onward)     Start     Ordered   02/10/22 0000  Diet - low sodium heart healthy        02/10/22 1232           Regular diet DISCHARGE MEDICATION: Allergies as of 02/16/2022       Reactions   Codeine Nausea And Vomiting        Medication List     STOP taking these medications    clopidogrel 75 MG tablet Commonly known as: PLAVIX   lisinopril 20 MG tablet Commonly known as: ZESTRIL   metFORMIN 500 MG 24 hr tablet Commonly known as: GLUCOPHAGE-XR        TAKE these medications    acetaminophen 325 MG tablet Commonly known as: TYLENOL Take 650 mg by mouth every 6 (six) hours as needed for moderate pain or mild pain.   amLODipine 5 MG tablet Commonly known as: NORVASC Take 1 tablet (5 mg total) by mouth daily. Start taking on: February 17, 2022   apixaban 5 MG Tabs tablet Commonly known as: ELIQUIS Take 1 tablet (5 mg total) by mouth 2 (two) times daily.   busPIRone 5 MG tablet Commonly known as: BUSPAR Take 1 tablet (5 mg total) by mouth 2 (two) times daily.   Centrum Silver 50+Women Tabs Take 1 tablet by mouth daily.   COLLAGEN PO Take 1 capsule by mouth daily.   diclofenac Sodium 1 % Gel Commonly known as: VOLTAREN Apply 4 g topically 4 (four) times daily.   furosemide 20 MG tablet Commonly known as: LASIX Take 1 tablet (20 mg total) by mouth every morning.   gabapentin 100 MG capsule Commonly known as: NEURONTIN Take 1 capsule (  100 mg total) by mouth 3 (three) times daily.   glimepiride 2 MG tablet Commonly known as: AMARYL Take 2 mg by mouth daily.   linagliptin 5 MG Tabs tablet Commonly known as: TRADJENTA Take 1 tablet (5 mg total) by mouth daily.   Magnesium 400 MG Caps Take 400 mg by mouth daily.   pantoprazole 40 MG tablet Commonly known as: PROTONIX Take 1 tablet (40 mg total) by mouth daily.   polyvinyl alcohol 1.4 % ophthalmic solution Commonly known as: LIQUIFILM TEARS Place 1 drop into the left eye as needed for dry eyes.   rosuvastatin 20 MG tablet Commonly known as: CRESTOR Take 1 tablet (20 mg total) by mouth daily.   traMADol 50 MG tablet Commonly known as: ULTRAM Take 1 tablet (50 mg total) by mouth every 6 (six) hours as needed for moderate pain.   traZODone 50 MG tablet Commonly known as: DESYREL Take 1 tablet (50 mg total) by mouth at bedtime as needed for sleep.   valACYclovir 1000 MG tablet Commonly known as: VALTREX Take 1 tablet (1,000 mg total) by mouth 3 (three)  times daily for 7 days.               Durable Medical Equipment  (From admission, onward)           Start     Ordered   02/10/22 1321  DME 3-in-1  Once        02/10/22 1321            Discharge Exam: Filed Weights   02/08/22 1953 02/09/22 2032  Weight: 99 kg 99.8 kg   General-appears in no acute distress Heart-S1-S2, regular, no murmur auscultated Lungs-clear to auscultation bilaterally, no wheezing or crackles auscultated Abdomen-soft, nontender, no organomegaly Extremities-no edema in the lower extremities Neuro-alert, oriented x3, no focal deficit noted  Condition at discharge: good  The results of significant diagnostics from this hospitalization (including imaging, microbiology, ancillary and laboratory) are listed below for reference.   Imaging Studies: CT Angio Chest PE W and/or Wo Contrast  Result Date: 02/08/2022 CLINICAL DATA:  Tachycardia, shortness of breath, chest pain EXAM: CT ANGIOGRAPHY CHEST WITH CONTRAST TECHNIQUE: Multidetector CT imaging of the chest was performed using the standard protocol during bolus administration of intravenous contrast. Multiplanar CT image reconstructions and MIPs were obtained to evaluate the vascular anatomy. RADIATION DOSE REDUCTION: This exam was performed according to the departmental dose-optimization program which includes automated exposure control, adjustment of the mA and/or kV according to patient size and/or use of iterative reconstruction technique. CONTRAST:  56m OMNIPAQUE IOHEXOL 350 MG/ML SOLN COMPARISON:  06/06/2018 FINDINGS: Cardiovascular: There is homogeneous enhancement in thoracic aorta. There are no intraluminal filling defect in central pulmonary artery branches. Evaluation of small peripheral branches is limited by motion artifact. Scattered coronary artery calcifications are seen. Mediastinum/Nodes: There are enlarged lymph nodes in mediastinum and both hilar regions with no significant interval  change. Lungs/Pleura: Breathing motion limits evaluation of lung fields. As far as seen, there is no focal pulmonary consolidation. There is narrowing of AP diameter of the trachea and main bronchi, possibly suggesting bronchospasm. There is no pleural effusion or pneumothorax. Upper Abdomen: There is fatty infiltration in liver. There is minimal nodularity of liver surface. There is possible hyperplasia in left adrenal. Musculoskeletal: Degenerative changes are noted in thoracic spine with bony spurs. Review of the MIP images confirms the above findings. IMPRESSION: There is no evidence of central pulmonary artery embolism. Evaluation of small peripheral  pulmonary artery branches is limited by motion artifact. There is no evidence of thoracic aortic dissection. There is no focal pulmonary consolidation. Coronary artery calcifications are seen. Fatty liver. There is minimal nodularity of the liver surface suggesting possible cirrhosis. Electronically Signed   By: Elmer Picker M.D.   On: 02/08/2022 19:29   VAS [place] LOWER EXTREMITY VENOUS (DVT) (ONLY [facility] & [facility])  Result Date: 02/08/2022  Lower Venous DVT Study Patient Name:  ARLIN SAVONA  Date of Exam:   02/08/2022 Medical Rec #: [id number]       Accession #:    [id number] Date of Birth: 24-Apr-1956       Patient Gender: F Patient Age:   24 years Exam Location:  [facility] Procedure:      VAS [place] LOWER EXTREMITY VENOUS (DVT) Referring Phys: Marda Stalker --------------------------------------------------------------------------------  Indications: Bilateral lower extremity pitting edema and foot pain.  Risk Factors: Immobility (patient was in in patient rehab until last week) Recent stroke affecting left side. Limitations: Edema, patient movement. Comparison Study: Prior negative right LEV done 05/02/12 Performing Technologist: Sharion Dove RVS  Examination Guidelines: A complete evaluation includes B-mode imaging, spectral Doppler, color Doppler,  and power Doppler as needed of all accessible portions of each vessel. Bilateral testing is considered an integral part of a complete examination. Limited examinations for reoccurring indications may be performed as noted. The reflux portion of the exam is performed with the patient in reverse Trendelenburg.  +---------+---------------+---------+-----------+----------+--------------+ RIGHT    CompressibilityPhasicitySpontaneityPropertiesThrombus Aging +---------+---------------+---------+-----------+----------+--------------+ CFV      Full           Yes      Yes                                 +---------+---------------+---------+-----------+----------+--------------+ SFJ      Full                                                        +---------+---------------+---------+-----------+----------+--------------+ FV Prox  Full                                                        +---------+---------------+---------+-----------+----------+--------------+ FV Mid   Full                                                        +---------+---------------+---------+-----------+----------+--------------+ FV DistalFull                                                        +---------+---------------+---------+-----------+----------+--------------+ PFV      Full                                                        +---------+---------------+---------+-----------+----------+--------------+  POP      Full           Yes      Yes                                 +---------+---------------+---------+-----------+----------+--------------+ PTV      Full                                                        +---------+---------------+---------+-----------+----------+--------------+ PERO     Full                                                        +---------+---------------+---------+-----------+----------+--------------+    +---------+---------------+---------+-----------+----------+--------------+ LEFT     CompressibilityPhasicitySpontaneityPropertiesThrombus Aging +---------+---------------+---------+-----------+----------+--------------+ CFV      Full           Yes      Yes                                 +---------+---------------+---------+-----------+----------+--------------+ SFJ      Full                                                        +---------+---------------+---------+-----------+----------+--------------+ FV Prox  Full                                                        +---------+---------------+---------+-----------+----------+--------------+ FV Mid   Full                                                        +---------+---------------+---------+-----------+----------+--------------+ FV DistalFull                                                        +---------+---------------+---------+-----------+----------+--------------+ PFV      Full                                                        +---------+---------------+---------+-----------+----------+--------------+ POP      Full           Yes      Yes                                 +---------+---------------+---------+-----------+----------+--------------+  PTV      Full                                                        +---------+---------------+---------+-----------+----------+--------------+ PERO     Partial        No       No                   Chronic        +---------+---------------+---------+-----------+----------+--------------+ Small segment of probable chronic DVT noted in one of the paired peroneal veins mid calf.    Summary: BILATERAL: -No evidence of popliteal cyst, bilaterally. RIGHT: - There is no evidence of deep vein thrombosis in the lower extremity.  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 - Small segment of  probable chronic DVT noted in one of the paired peroneal veins mid calf.  *See table(s) above for measurements and observations. Electronically signed by Harold Barban MD on 02/08/2022 at 7:16:56 PM.    Final    DG Foot Complete Left  Result Date: 02/08/2022 CLINICAL DATA:  Foot pain, redness and swelling. EXAM: LEFT FOOT - COMPLETE 3+ VIEW COMPARISON:  01/20/2022 FINDINGS: No acute fracture. Stable alignment. Moderate midfoot dorsal spurring. Mild tibial talar spurring. Large plantar calcaneal spur and Achilles tendon enthesophyte. No erosion or bone destruction. Generalized soft tissue edema similar to prior exam. Prominent vascular calcifications. No soft tissue gas or radiopaque foreign body. IMPRESSION: 1. Generalized soft tissue edema similar to prior exam. No soft tissue gas or radiopaque foreign body. 2. No acute osseous findings. Midfoot and ankle joint osteoarthritis. 3. Plantar calcaneal spur and Achilles tendon enthesophyte. Electronically Signed   By: Keith Rake M.D.   On: 02/08/2022 17:45   DG Foot Complete Right  Result Date: 02/08/2022 CLINICAL DATA:  Foot pain, redness and swelling. EXAM: RIGHT FOOT COMPLETE - 3+ VIEW COMPARISON:  Radiograph 01/19/2022 FINDINGS: Medial malleolar and distal fibular hardware partially visualized. Possible lucency about the medial malleolar screw, seen only on the oblique view. Degenerative spurring of the midfoot. Tibial talar osteoarthritis. Findings are similar to prior exam. Unchanged plantar calcaneal spur and Achilles tendon enthesophyte. No fracture. No erosive change or bony destruction. Generalized soft tissue edema, similar. No soft tissue gas or radiopaque foreign body. IMPRESSION: 1. Generalized soft tissue edema, similar to prior. No soft tissue gas or radiopaque foreign body. 2. 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 No acute osseous findings. 3. Surgical hardware in the distal tibia and fibula. Possible but not definite  lucency about medial malleolar screw which may represent loosening or infection. Electronically Signed   By: Keith Rake M.D.   On: 02/08/2022 17:43   DG Chest 2 View  Result Date: 02/08/2022 CLINICAL DATA:  Shortness of breath EXAM: CHEST - 2 VIEW COMPARISON:  06/06/2018 FINDINGS: The heart size and mediastinal contours are within normal limits. No focal airspace consolidation, pleural effusion, or pneumothorax. The visualized skeletal structures are unremarkable. IMPRESSION: No active cardiopulmonary disease. Electronically Signed   By: Davina Poke D.O.   On: 02/08/2022 11:49   DG Foot 2 Views Left  Result Date: 01/20/2022 CLINICAL DATA:  Left foot pain and redness. Bump on a table 3 weeks ago. EXAM: LEFT FOOT - 2 VIEW COMPARISON:  Left foot  x-rays dated August 18, 2007. FINDINGS: No acute fracture or dislocation. Mild tibiotalar and midfoot osteoarthritis. Large Achilles and plantar calcaneal enthesophytes. Bone mineralization is normal. Mild diffuse soft tissue swelling. IMPRESSION: 1. Mild diffuse soft tissue swelling. No acute osseous abnormality. Electronically Signed   By: Titus Dubin M.D.   On: 01/20/2022 10:15   DG Foot 2 Views Right  Result Date: 01/19/2022 CLINICAL DATA:  [id number] EXAM: RIGHT FOOT - 2 VIEW COMPARISON:  06/03/2016 FINDINGS: Distant ORIF of distal fibular and medial malleolar fractures. Osteoarthritis of the ankle joint with joint space narrowing and marginal osteophytes. Calcaneal spurs are present. There is osteoarthritis of the midfoot. No evidence of regional fracture or focal bone lesion. Degenerative changes have worsened since 2017. 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 No acute finding. Electronically Signed   By: Nelson Chimes M.D.   On: 01/19/2022 13:17    Microbiology: Results for orders placed or performed during the hospital encounter of 01/06/22  Resp Panel by RT-PCR (Flu A&B, Covid) Anterior Nasal  Swab     Status: None   Collection Time: 01/06/22  7:25 PM   Specimen: Anterior Nasal Swab  Result Value Ref Range Status   SARS Coronavirus 2 by RT PCR NEGATIVE NEGATIVE Final    Comment: (NOTE) SARS-CoV-2 target nucleic acids are NOT DETECTED.  The SARS-CoV-2 RNA is generally detectable in upper respiratory specimens during the acute phase of infection. The lowest concentration of SARS-CoV-2 viral copies this assay can detect is 138 copies/mL. A negative result does not preclude SARS-Cov-2 infection and should not be used as the sole basis for treatment or other patient management decisions. A negative result may occur with  improper specimen collection/handling, submission of specimen other than nasopharyngeal swab, presence of viral mutation(s) within the areas targeted by this assay, and inadequate number of viral copies(<138 copies/mL). A negative result must be combined with clinical observations, patient history, and epidemiological information. The expected result is Negative.  Fact Sheet for Patients:  EntrepreneurPulse.com.au  Fact Sheet for Healthcare Providers:  IncredibleEmployment.be  This test is no t yet approved or cleared by the [place] FDA and  has been authorized for detection and/or diagnosis of SARS-CoV-2 by FDA under an Emergency Use Authorization (EUA). This EUA will remain  in effect (meaning this test can be used) for the duration of the COVID-19 declaration under Section 564(b)(1) of the Act, 21 U.S.C.section 360bbb-3(b)(1), unless the authorization is terminated  or revoked sooner.       Influenza A by PCR NEGATIVE NEGATIVE Final   Influenza B by PCR NEGATIVE NEGATIVE Final    Comment: (NOTE) The Xpert Xpress SARS-CoV-2/FLU/RSV plus assay is intended as an aid in the diagnosis of influenza from Nasopharyngeal swab specimens and should not be used as a sole basis for treatment. Nasal washings and aspirates  are unacceptable for Xpert Xpress SARS-CoV-2/FLU/RSV testing.  Fact Sheet for Patients: EntrepreneurPulse.com.au  Fact Sheet for Healthcare Providers: IncredibleEmployment.be  This test is not yet approved or cleared by the [place] FDA and has been authorized for detection and/or diagnosis of SARS-CoV-2 by FDA under an Emergency Use Authorization (EUA). This EUA will remain in effect (meaning this test can be used) for the duration of the COVID-19 declaration under Section 564(b)(1) of the Act, 21 U.S.C. section 360bbb-3(b)(1), unless the authorization is terminated or revoked.  Performed at [facility], [address], [place], [place] [address]   MRSA Next Gen by PCR,  Nasal     Status: None   Collection Time: 01/06/22 11:47 PM   Specimen: Nasal Mucosa; Nasal Swab  Result Value Ref Range Status   MRSA by PCR Next Gen NOT DETECTED NOT DETECTED Final    Comment: (NOTE) The GeneXpert MRSA Assay (FDA approved for NASAL specimens only), is one component of a comprehensive MRSA colonization surveillance program. It is not intended to diagnose MRSA infection nor to guide or monitor treatment for MRSA infections. Test performance is not FDA approved in patients less than 59 years old. Performed at [facility], [place] [address], [place], [place] [address]     Labs: CBC: Recent Labs  Lab 02/10/22 0330 02/12/22 0155  WBC 9.1 9.2  HGB 10.2* 10.6*  HCT 31.0* 30.7*  MCV 83.3 81.2  PLT 303 929*   Basic Metabolic Panel: Recent Labs  Lab 02/10/22 0330 02/11/22 0248 02/12/22 0155 02/15/22 0116 02/16/22 0506  NA 139 138 137 137  --   K 3.8 4.0 4.1 3.8  --   CL 102 100 102 100  --   CO2 '24 27 26 29  '$ --   GLUCOSE 147* 166* 206* 137*  --   BUN '16 19 23 '$ 24*  --   CREATININE 1.07* 1.16* 1.02* 1.25*  --   CALCIUM 9.1 9.2 9.1 9.0  --   MG  --   --   --  1.8 2.4   Liver Function Tests: No results for input(s): "AST", "ALT",  "ALKPHOS", "BILITOT", "PROT", "ALBUMIN" in the last 168 hours. CBG: Recent Labs  Lab 02/15/22 1106 02/15/22 1656 02/15/22 2133 02/16/22 0614 02/16/22 1144  GLUCAP 131* 179* 130* 138* 178*    Discharge time spent: greater than 30 minutes.  Signed: Oswald Hillock, MD [facility] 02/16/2022

## 2022-02-16 NOTE — Evaluation (Signed)
Occupational Therapy Re-Evaluation and Discharge Patient Details Name: Shelby Morgan MRN: [id number] DOB: July 14, 1956 Today's Date: 02/16/2022   History of Present Illness 66 y/o female admitted 7/16 with bil LE edema, LLE DVT and LLE gout.7/24 dx with shingles. PMhx: CVA (2022 and june 2023), Bells palsy, HLD, DM, HTN, melanoma, anxiety   Clinical Impression   This 66 yo female admitted with above presents to acute OT with being at a S level for basic ADLs on a RW, she has decreased use of LUE (more sensory than motor) and the same for LLE. She reports she will have ample A from family and friends once she D/C's. She has some items in room to continue to work on her LUE and has been educated on other activities to do as well until she gets to [place]. No further acute OT we will sign off.     Recommendations for follow up therapy are one component of a multi-disciplinary discharge planning process, led by the attending physician.  Recommendations may be updated based on patient status, additional functional criteria and insurance authorization.   Follow Up Recommendations  Outpatient OT    Assistance Recommended at Discharge Intermittent Supervision/Assistance  Patient can return home with the following Assistance with cooking/housework;Assist for transportation;Help with stairs or ramp for entrance    Functional Status Assessment  Patient has had a recent decline in their functional status and demonstrates the ability to make significant improvements in function in a reasonable and predictable amount of time. (no further acute OT, HHOT recommended)  Equipment Recommendations  BSC/3in1       Precautions / Restrictions Precautions Precautions: Fall Precaution Comments: very mild L hemiparesis with impaired sensation and decreased coordination Restrictions Weight Bearing Restrictions: No      Mobility Bed Mobility               General bed mobility comments: pt up in recliner  upon my arrival    Transfers Overall transfer level: Needs assistance Equipment used: Rolling walker (2 wheels) Transfers: Sit to/from Stand Sit to Stand: Supervision                  Balance Overall balance assessment: Needs assistance Sitting-balance support: No upper extremity supported, Feet supported Sitting balance-Leahy Scale: Normal     Standing balance support: No upper extremity supported Standing balance-Leahy Scale: Good Standing balance comment: standing at dry erase board to write with LUE                           ADL either performed or assessed with clinical judgement   ADL Overall ADL's : Needs assistance/impaired                                       General ADL Comments: overall at A setup/S level with increased time     Vision Baseline Vision/History: 1 Wears glasses (at all times) Ability to See in Adequate Light: 0 Adequate Patient Visual Report: No change from baseline              Pertinent Vitals/Pain Pain Assessment Pain Assessment: 0-10 Faces Pain Scale: Hurts a little bit Pain Location: chronic back Pain Descriptors / Indicators: Aching, Guarding Pain Intervention(s): Limited activity within patient's tolerance, Monitored during session, Repositioned     Hand Dominance Right   Extremity/Trunk Assessment Upper Extremity Assessment Upper Extremity  Assessment: LUE deficits/detail LUE Deficits / Details: impaired sensation with decreased fine motor LUE Sensation: decreased light touch;decreased proprioception LUE Coordination: decreased fine motor;decreased gross motor           Communication Communication Communication: No difficulties   Cognition Arousal/Alertness: Awake/alert Behavior During Therapy: WFL for tasks assessed/performed Overall Cognitive Status: Within Functional Limits for tasks assessed                                          Exercises Other  Exercises Other Exercises: Educated on use of LUE to write on dry erase board, to use LUE to manipulate objects in hand (ie: a pen), to use it functionally for all basic ADLs, to put clothes on hangers then hang them up.        Home Living Family/patient expects to be discharged to:: Private residence Living Arrangements: Parent;Non-relatives/Friends;Other relatives Available Help at Discharge: Family;Available 24 hours/day Type of Home: House Home Access: Stairs to enter [employer] of Steps: 7 Entrance Stairs-Rails: Right;Left Home Layout: Two level Alternate Level Stairs-Number of Steps: 2   Bathroom Shower/Tub: Teacher, early years/pre: Standard   How Accessible: Accessible via walker Home Equipment: Tub bench;Rolling Walker (2 wheels);Rollator (4 wheels);Hand held shower head;Grab bars - tub/shower   Additional Comments: normally lives alone, staying with mom since CVA  Lives With: Alone    Prior Functioning/Environment Prior Level of Function : Needs assist       Physical Assist : ADLs (physical)     Mobility Comments: Walking with RW since CVA ADLs Comments: mom has been assisting with bathing/dressing; using tub bench        OT Problem List: Decreased strength;Decreased range of motion;Impaired balance (sitting and/or standing);Impaired UE functional use;Pain         OT Goals(Current goals can be found in the care plan section) Acute Rehab OT Goals Patient Stated Goal: to go home and get OPOT         AM-PAC OT "6 Clicks" Daily Activity     Outcome Measure Help from another person eating meals?: None Help from another person taking care of personal grooming?: None Help from another person toileting, which includes using toliet, bedpan, or urinal?: A Little (S) Help from another person bathing (including washing, rinsing, drying)?: A Little (S) Help from another person to put on and taking off regular upper body clothing?: A Little  (S) Help from another person to put on and taking off regular lower body clothing?: A Little (S) 6 Click Score: 20   End of Session Equipment Utilized During Treatment: Rolling walker (2 wheels)  Activity Tolerance: Patient tolerated treatment well Patient left: in bed;with call bell/phone within reach;with bed alarm set  OT Visit Diagnosis: Unsteadiness on feet (R26.81);Other abnormalities of gait and mobility (R26.89);Pain;Hemiplegia and hemiparesis Hemiplegia - Right/Left: Left Hemiplegia - dominant/non-dominant: Non-Dominant Hemiplegia - caused by: Cerebral infarction Pain - part of body:  (back)                Time: 1411-1436 OT Time Calculation (min): 25 min Charges:  OT General Charges $OT Visit: 1 Visit OT Evaluation $OT Re-eval: 1 Re-eval OT Treatments $Therapeutic Activity: 8-22 mins  Golden Circle, OTR/L Acute Rehab Services [facility] [place] [phone] Office [phone]    Almon Register 02/16/2022, 5:32 PM

## 2022-02-16 NOTE — Progress Notes (Signed)
D/c tele and IV. Went over AVS with pt and all questions were addressed.   Jochebed Bills S Katyra Tomassetti, RN  

## 2022-02-16 NOTE — Progress Notes (Signed)
Physical Therapy Treatment Patient Details Name: Shelby Morgan MRN: [id number] DOB: 08-Mar-1956 Today's Date: 02/16/2022   History of Present Illness 66 y/o female admitted 7/16 with bil LE edema, LLE DVT and LLE gout. PMhx: CVA (2022 and june 2023), Bells palsy, HLD, DM, HTN, melanoma, anxiety    PT Comments    Pt making good progress.  She demonstrates improved safety and ambulation.  Did not ambulate as far as she did over weekend due to now on airborne precautions and reports chronic back pain in flairing.  Pt does report that she has set up for increased assistance at home and would rather do outpt PT/OT over HHPT/OT.  Updated recommendations.     Recommendations for follow up therapy are one component of a multi-disciplinary discharge planning process, led by the attending physician.  Recommendations may be updated based on patient status, additional functional criteria and insurance authorization.  Follow Up Recommendations  Outpatient PT     Assistance Recommended at Discharge Intermittent Supervision/Assistance  Patient can return home with the following Assistance with cooking/housework;Assist for transportation;Help with stairs or ramp for entrance   Equipment Recommendations  BSC/3in1    Recommendations for Other Services       Precautions / Restrictions Precautions Precautions: Fall Precaution Comments: very mild L hemiparesis with impaired sensation Restrictions LLE Weight Bearing: Weight bearing as tolerated     Mobility  Bed Mobility Overal bed mobility: Needs Assistance Bed Mobility: Supine to Sit     Supine to sit: Supervision          Transfers Overall transfer level: Needs assistance Equipment used: Rolling walker (2 wheels) Transfers: Sit to/from Stand Sit to Stand: Supervision                Ambulation/Gait Ambulation/Gait assistance: Supervision Gait Distance (Feet): 80 Feet Assistive device: Rolling walker (2 wheels) Gait  Pattern/deviations: Step-to pattern, Trunk flexed       General Gait Details: Limited to in room due to airborne precautions for possible shingles per RN.  Pt tended to flex trunk but could correct.  Had one instance where she felt unstable turning (no overt LOB) and recovred without assist   Stairs             Wheelchair Mobility    Modified Rankin (Stroke Patients Only)       Balance Overall balance assessment: Needs assistance   Sitting balance-Leahy Scale: Normal     Standing balance support: No upper extremity supported Standing balance-Leahy Scale: Fair                 High Level Balance Comments: Able to stand feet apart EO and EC without difficulty; some sway with EC and feet together.  Marched in place but required RW.  Reaching outside BOS with feet apart without UE support (forward low, mid, high; and to the side with rotation)            Cognition Arousal/Alertness: Awake/alert Behavior During Therapy: WFL for tasks assessed/performed Overall Cognitive Status: Within Functional Limits for tasks assessed                                          Exercises Other Exercises Other Exercises: sit to stands x 5 with L leg favored    General Comments General comments (skin integrity, edema, etc.): Pt reports brothers plan to stay at night and she  is getting friends to assist during day so that all doesn't fall on her elderly mother      Pertinent Vitals/Pain Pain Assessment Pain Assessment: 0-10 Pain Score: 4  Pain Location: R ankle and chronic back Pain Descriptors / Indicators: Aching, Guarding Pain Intervention(s): Limited activity within patient's tolerance, Monitored during session    Home Living                          Prior Function            PT Goals (current goals can now be found in the care plan section) Progress towards PT goals: Progressing toward goals    Frequency    Min 3X/week       PT Plan Discharge plan needs to be updated    Co-evaluation              AM-PAC PT "6 Clicks" Mobility   Outcome Measure  Help needed turning from your back to your side while in a flat bed without using bedrails?: None Help needed moving from lying on your back to sitting on the side of a flat bed without using bedrails?: None Help needed moving to and from a bed to a chair (including a wheelchair)?: A Little Help needed standing up from a chair using your arms (e.g., wheelchair or bedside chair)?: A Little Help needed to walk in hospital room?: A Little Help needed climbing 3-5 steps with a railing? : A Little 6 Click Score: 20    End of Session Equipment Utilized During Treatment: Gait belt Activity Tolerance: Patient tolerated treatment well Patient left: in chair;with call bell/phone within reach Nurse Communication: Mobility status PT Visit Diagnosis: Other abnormalities of gait and mobility (R26.89);Difficulty in walking, not elsewhere classified (R26.2);Muscle weakness (generalized) (M62.81);Pain Pain - Right/Left: Left Pain - part of body: Leg     Time: 1308-6578 PT Time Calculation (min) (ACUTE ONLY): 36 min  Charges:  $Gait Training: 8-22 mins $Neuromuscular Re-education: 8-22 mins                     Abran Richard, PT Acute Rehab Services Pager [phone] [facility] [place] 02/16/2022, 12:48 PM

## 2022-02-16 NOTE — TOC Progression Note (Addendum)
Transition of Care [facility]) - Progression Note    Patient Details  Name: Shelby Morgan MRN: [id number] Date of Birth: 1955/09/13  Transition of Care [facility]) CM/SW Contact  Vinie Sill, [place] Phone Number: 02/16/2022, 10:06 AM  Clinical Narrative:     Received email from Dr Eliseo Squires, responding to Wynelle Cleveland, the Peer to Peer was not completed-   Expected Discharge Plan: [place] Barriers to Discharge: Insurance Authorization, [place] ([place])  Expected Discharge Plan and Services Expected Discharge Plan: [place] In-house Referral: Clinical Social Work Discharge Planning Services: CM Consult Post Acute Care Choice: [place], [place] Living arrangements for the past 2 months: Single Family Home (was staying w/ 32yrold mother, however lives alone in apt) Expected Discharge Date: 02/10/22               DME Arranged: 3-N-1, Wheelchair manual                     Social Determinants of Health (SDOH) Interventions    Readmission Risk Interventions     No data to display

## 2022-02-16 NOTE — Care Management (Signed)
ED RNCM received call from Unit RN concerning patient requesting [facility] services instead of OP rehab.   ED  RNCM  requested [place] orders be placed, patient has [place] Medicare  will find [place] within insurers network who will be able to accept the referral. CM will follow up with patient tomorrow once [place] has accepted referral.

## 2022-02-18 NOTE — Care Management (Addendum)
Post discharge 02/18/22 1030 Received phone call from patient's son- Shelby Morgan regarding [facility] arrangements.  Reached out to [place] to confirm which [facility] agency had accepted referral- per [employer] [facility] [employer] had been called and accepted referral on day of discharge.  Call made to [facility]- spoke w/ [name]- confirmed pt has been accepted and [facility] has received insurance auth for [facility] servcies- pt is covered at 100% w/ no copay. Per [name] someone should be calling pt today to schedule initial [place] visit.   This CM- reached out to patient to let her know about [place] arrangements w/ [facility] as well as returned called to son-[name] to update on [facility] as well. Both patient and son appreciative for assistance w/ [place] arrangements.

## 2022-02-20 ENCOUNTER — Other Ambulatory Visit: Payer: Self-pay | Admitting: Physical Medicine and Rehabilitation

## 2022-02-21 DIAGNOSIS — E1169 Type 2 diabetes mellitus with other specified complication: Secondary | ICD-10-CM | POA: Diagnosis not present

## 2022-02-21 DIAGNOSIS — I82552 Chronic embolism and thrombosis of left peroneal vein: Secondary | ICD-10-CM | POA: Diagnosis not present

## 2022-02-21 DIAGNOSIS — K760 Fatty (change of) liver, not elsewhere classified: Secondary | ICD-10-CM | POA: Diagnosis not present

## 2022-02-21 DIAGNOSIS — R600 Localized edema: Secondary | ICD-10-CM | POA: Diagnosis not present

## 2022-02-21 DIAGNOSIS — M10072 Idiopathic gout, left ankle and foot: Secondary | ICD-10-CM | POA: Diagnosis not present

## 2022-02-21 DIAGNOSIS — I69354 Hemiplegia and hemiparesis following cerebral infarction affecting left non-dominant side: Secondary | ICD-10-CM | POA: Diagnosis not present

## 2022-02-21 DIAGNOSIS — Z7901 Long term (current) use of anticoagulants: Secondary | ICD-10-CM | POA: Diagnosis not present

## 2022-02-21 DIAGNOSIS — F419 Anxiety disorder, unspecified: Secondary | ICD-10-CM | POA: Diagnosis not present

## 2022-02-21 DIAGNOSIS — Z7984 Long term (current) use of oral hypoglycemic drugs: Secondary | ICD-10-CM | POA: Diagnosis not present

## 2022-02-21 DIAGNOSIS — E669 Obesity, unspecified: Secondary | ICD-10-CM | POA: Diagnosis not present

## 2022-02-21 DIAGNOSIS — E785 Hyperlipidemia, unspecified: Secondary | ICD-10-CM | POA: Diagnosis not present

## 2022-02-21 DIAGNOSIS — N182 Chronic kidney disease, stage 2 (mild): Secondary | ICD-10-CM | POA: Diagnosis not present

## 2022-02-21 DIAGNOSIS — E1122 Type 2 diabetes mellitus with diabetic chronic kidney disease: Secondary | ICD-10-CM | POA: Diagnosis not present

## 2022-02-21 DIAGNOSIS — I129 Hypertensive chronic kidney disease with stage 1 through stage 4 chronic kidney disease, or unspecified chronic kidney disease: Secondary | ICD-10-CM | POA: Diagnosis not present

## 2022-02-21 DIAGNOSIS — G4733 Obstructive sleep apnea (adult) (pediatric): Secondary | ICD-10-CM | POA: Diagnosis not present

## 2022-02-21 DIAGNOSIS — K219 Gastro-esophageal reflux disease without esophagitis: Secondary | ICD-10-CM | POA: Diagnosis not present

## 2022-02-21 DIAGNOSIS — B029 Zoster without complications: Secondary | ICD-10-CM | POA: Diagnosis not present

## 2022-02-23 DIAGNOSIS — I82409 Acute embolism and thrombosis of unspecified deep veins of unspecified lower extremity: Secondary | ICD-10-CM | POA: Diagnosis not present

## 2022-02-23 DIAGNOSIS — M5030 Other cervical disc degeneration, unspecified cervical region: Secondary | ICD-10-CM | POA: Diagnosis not present

## 2022-02-23 DIAGNOSIS — E1140 Type 2 diabetes mellitus with diabetic neuropathy, unspecified: Secondary | ICD-10-CM | POA: Diagnosis not present

## 2022-02-23 DIAGNOSIS — T50905A Adverse effect of unspecified drugs, medicaments and biological substances, initial encounter: Secondary | ICD-10-CM | POA: Diagnosis not present

## 2022-02-23 DIAGNOSIS — Z6838 Body mass index (BMI) 38.0-38.9, adult: Secondary | ICD-10-CM | POA: Diagnosis not present

## 2022-03-06 DIAGNOSIS — I69354 Hemiplegia and hemiparesis following cerebral infarction affecting left non-dominant side: Secondary | ICD-10-CM | POA: Diagnosis not present

## 2022-03-06 DIAGNOSIS — I82552 Chronic embolism and thrombosis of left peroneal vein: Secondary | ICD-10-CM | POA: Diagnosis not present

## 2022-03-06 DIAGNOSIS — I129 Hypertensive chronic kidney disease with stage 1 through stage 4 chronic kidney disease, or unspecified chronic kidney disease: Secondary | ICD-10-CM | POA: Diagnosis not present

## 2022-03-06 DIAGNOSIS — E1122 Type 2 diabetes mellitus with diabetic chronic kidney disease: Secondary | ICD-10-CM | POA: Diagnosis not present

## 2022-03-12 ENCOUNTER — Telehealth: Payer: Self-pay | Admitting: Physical Medicine and Rehabilitation

## 2022-03-12 NOTE — Telephone Encounter (Signed)
Pt called requesting to set an appt for back injections. Please call pt at [phone].

## 2022-03-13 ENCOUNTER — Other Ambulatory Visit: Payer: Self-pay | Admitting: Physical Medicine and Rehabilitation

## 2022-03-17 ENCOUNTER — Other Ambulatory Visit: Payer: Self-pay | Admitting: Physical Medicine & Rehabilitation

## 2022-03-17 ENCOUNTER — Encounter: Payer: Medicare HMO | Admitting: Nutrition

## 2022-03-23 DIAGNOSIS — I129 Hypertensive chronic kidney disease with stage 1 through stage 4 chronic kidney disease, or unspecified chronic kidney disease: Secondary | ICD-10-CM | POA: Diagnosis not present

## 2022-03-23 DIAGNOSIS — I69354 Hemiplegia and hemiparesis following cerebral infarction affecting left non-dominant side: Secondary | ICD-10-CM | POA: Diagnosis not present

## 2022-03-23 DIAGNOSIS — E1140 Type 2 diabetes mellitus with diabetic neuropathy, unspecified: Secondary | ICD-10-CM | POA: Diagnosis not present

## 2022-03-23 DIAGNOSIS — I6930 Unspecified sequelae of cerebral infarction: Secondary | ICD-10-CM | POA: Diagnosis not present

## 2022-03-23 DIAGNOSIS — E1122 Type 2 diabetes mellitus with diabetic chronic kidney disease: Secondary | ICD-10-CM | POA: Diagnosis not present

## 2022-03-23 DIAGNOSIS — I10 Essential (primary) hypertension: Secondary | ICD-10-CM | POA: Diagnosis not present

## 2022-03-23 DIAGNOSIS — Z6837 Body mass index (BMI) 37.0-37.9, adult: Secondary | ICD-10-CM | POA: Diagnosis not present

## 2022-03-29 ENCOUNTER — Encounter (HOSPITAL_COMMUNITY): Payer: Self-pay | Admitting: Emergency Medicine

## 2022-03-29 ENCOUNTER — Emergency Department (HOSPITAL_COMMUNITY): Payer: Medicare HMO

## 2022-03-29 ENCOUNTER — Other Ambulatory Visit: Payer: Self-pay

## 2022-03-29 ENCOUNTER — Observation Stay (HOSPITAL_COMMUNITY)
Admission: EM | Admit: 2022-03-29 | Discharge: 2022-03-30 | Disposition: A | Discharge status: Home Health | Payer: Medicare HMO | Attending: Family Medicine | Admitting: Family Medicine

## 2022-03-29 DIAGNOSIS — I6381 Other cerebral infarction due to occlusion or stenosis of small artery: Secondary | ICD-10-CM | POA: Diagnosis not present

## 2022-03-29 DIAGNOSIS — Z86718 Personal history of other venous thrombosis and embolism: Secondary | ICD-10-CM | POA: Diagnosis not present

## 2022-03-29 DIAGNOSIS — D6859 Other primary thrombophilia: Secondary | ICD-10-CM | POA: Insufficient documentation

## 2022-03-29 DIAGNOSIS — Z9981 Dependence on supplemental oxygen: Secondary | ICD-10-CM | POA: Diagnosis not present

## 2022-03-29 DIAGNOSIS — R269 Unspecified abnormalities of gait and mobility: Secondary | ICD-10-CM | POA: Diagnosis not present

## 2022-03-29 DIAGNOSIS — F411 Generalized anxiety disorder: Secondary | ICD-10-CM | POA: Diagnosis not present

## 2022-03-29 DIAGNOSIS — M6281 Muscle weakness (generalized): Secondary | ICD-10-CM | POA: Insufficient documentation

## 2022-03-29 DIAGNOSIS — J9601 Acute respiratory failure with hypoxia: Secondary | ICD-10-CM | POA: Diagnosis not present

## 2022-03-29 DIAGNOSIS — Z79899 Other long term (current) drug therapy: Secondary | ICD-10-CM | POA: Diagnosis not present

## 2022-03-29 DIAGNOSIS — Z8673 Personal history of transient ischemic attack (TIA), and cerebral infarction without residual deficits: Secondary | ICD-10-CM | POA: Diagnosis not present

## 2022-03-29 DIAGNOSIS — N182 Chronic kidney disease, stage 2 (mild): Secondary | ICD-10-CM | POA: Diagnosis not present

## 2022-03-29 DIAGNOSIS — E1169 Type 2 diabetes mellitus with other specified complication: Secondary | ICD-10-CM | POA: Diagnosis not present

## 2022-03-29 DIAGNOSIS — S3991XA Unspecified injury of abdomen, initial encounter: Secondary | ICD-10-CM | POA: Diagnosis not present

## 2022-03-29 DIAGNOSIS — K760 Fatty (change of) liver, not elsewhere classified: Secondary | ICD-10-CM | POA: Diagnosis present

## 2022-03-29 DIAGNOSIS — Z85828 Personal history of other malignant neoplasm of skin: Secondary | ICD-10-CM | POA: Insufficient documentation

## 2022-03-29 DIAGNOSIS — Z7984 Long term (current) use of oral hypoglycemic drugs: Secondary | ICD-10-CM | POA: Insufficient documentation

## 2022-03-29 DIAGNOSIS — W01198A Fall on same level from slipping, tripping and stumbling with subsequent striking against other object, initial encounter: Secondary | ICD-10-CM | POA: Insufficient documentation

## 2022-03-29 DIAGNOSIS — R2681 Unsteadiness on feet: Secondary | ICD-10-CM | POA: Insufficient documentation

## 2022-03-29 DIAGNOSIS — I129 Hypertensive chronic kidney disease with stage 1 through stage 4 chronic kidney disease, or unspecified chronic kidney disease: Secondary | ICD-10-CM | POA: Insufficient documentation

## 2022-03-29 DIAGNOSIS — Z7901 Long term (current) use of anticoagulants: Secondary | ICD-10-CM | POA: Insufficient documentation

## 2022-03-29 DIAGNOSIS — R52 Pain, unspecified: Secondary | ICD-10-CM | POA: Diagnosis present

## 2022-03-29 DIAGNOSIS — E876 Hypokalemia: Secondary | ICD-10-CM | POA: Insufficient documentation

## 2022-03-29 DIAGNOSIS — Z87891 Personal history of nicotine dependence: Secondary | ICD-10-CM | POA: Diagnosis not present

## 2022-03-29 DIAGNOSIS — Z043 Encounter for examination and observation following other accident: Secondary | ICD-10-CM | POA: Diagnosis not present

## 2022-03-29 DIAGNOSIS — D72829 Elevated white blood cell count, unspecified: Secondary | ICD-10-CM | POA: Diagnosis not present

## 2022-03-29 DIAGNOSIS — F419 Anxiety disorder, unspecified: Secondary | ICD-10-CM | POA: Diagnosis present

## 2022-03-29 DIAGNOSIS — R519 Headache, unspecified: Secondary | ICD-10-CM | POA: Diagnosis not present

## 2022-03-29 DIAGNOSIS — E669 Obesity, unspecified: Secondary | ICD-10-CM | POA: Diagnosis not present

## 2022-03-29 DIAGNOSIS — S2241XA Multiple fractures of ribs, right side, initial encounter for closed fracture: Secondary | ICD-10-CM | POA: Insufficient documentation

## 2022-03-29 DIAGNOSIS — R5381 Other malaise: Secondary | ICD-10-CM | POA: Diagnosis present

## 2022-03-29 DIAGNOSIS — Y92009 Unspecified place in unspecified non-institutional (private) residence as the place of occurrence of the external cause: Secondary | ICD-10-CM | POA: Insufficient documentation

## 2022-03-29 DIAGNOSIS — R0781 Pleurodynia: Secondary | ICD-10-CM | POA: Diagnosis present

## 2022-03-29 DIAGNOSIS — R590 Localized enlarged lymph nodes: Secondary | ICD-10-CM

## 2022-03-29 DIAGNOSIS — S2231XA Fracture of one rib, right side, initial encounter for closed fracture: Secondary | ICD-10-CM | POA: Diagnosis not present

## 2022-03-29 DIAGNOSIS — K219 Gastro-esophageal reflux disease without esophagitis: Secondary | ICD-10-CM | POA: Diagnosis present

## 2022-03-29 DIAGNOSIS — E1122 Type 2 diabetes mellitus with diabetic chronic kidney disease: Secondary | ICD-10-CM | POA: Insufficient documentation

## 2022-03-29 DIAGNOSIS — D6869 Other thrombophilia: Secondary | ICD-10-CM | POA: Diagnosis not present

## 2022-03-29 DIAGNOSIS — J811 Chronic pulmonary edema: Secondary | ICD-10-CM | POA: Diagnosis not present

## 2022-03-29 DIAGNOSIS — S0990XA Unspecified injury of head, initial encounter: Secondary | ICD-10-CM | POA: Diagnosis not present

## 2022-03-29 DIAGNOSIS — M19011 Primary osteoarthritis, right shoulder: Secondary | ICD-10-CM | POA: Diagnosis not present

## 2022-03-29 DIAGNOSIS — S2239XA Fracture of one rib, unspecified side, initial encounter for closed fracture: Secondary | ICD-10-CM | POA: Diagnosis present

## 2022-03-29 LAB — CBC
HCT: 35.5 % — ABNORMAL LOW (ref 36.0–46.0)
Hemoglobin: 11.3 g/dL — ABNORMAL LOW (ref 12.0–15.0)
MCH: 27.5 pg (ref 26.0–34.0)
MCHC: 31.8 g/dL (ref 30.0–36.0)
MCV: 86.4 fL (ref 80.0–100.0)
Platelets: 312 10*3/uL (ref 150–400)
RBC: 4.11 MIL/uL (ref 3.87–5.11)
RDW: 15.1 % (ref 11.5–15.5)
WBC: 13.6 10*3/uL — ABNORMAL HIGH (ref 4.0–10.5)
nRBC: 0 % (ref 0.0–0.2)

## 2022-03-29 LAB — COMPREHENSIVE METABOLIC PANEL
ALT: 28 U/L (ref 0–44)
AST: 35 U/L (ref 15–41)
Albumin: 4 g/dL (ref 3.5–5.0)
Alkaline Phosphatase: 53 U/L (ref 38–126)
Anion gap: 11 (ref 5–15)
BUN: 26 mg/dL — ABNORMAL HIGH (ref 8–23)
CO2: 26 mmol/L (ref 22–32)
Calcium: 9.3 mg/dL (ref 8.9–10.3)
Chloride: 99 mmol/L (ref 98–111)
Creatinine, Ser: 1.07 mg/dL — ABNORMAL HIGH (ref 0.44–1.00)
GFR, Estimated: 58 mL/min — ABNORMAL LOW (ref >60)
Glucose, Bld: 233 mg/dL — ABNORMAL HIGH (ref 70–99)
Potassium: 3.3 mmol/L — ABNORMAL LOW (ref 3.5–5.1)
Sodium: 136 mmol/L (ref 135–145)
Total Bilirubin: 0.7 mg/dL (ref 0.3–1.2)
Total Protein: 7.8 g/dL (ref 6.5–8.1)

## 2022-03-29 LAB — MAGNESIUM: Magnesium: 1.7 mg/dL (ref 1.7–2.4)

## 2022-03-29 LAB — PROTIME-INR
INR: 1.3 — ABNORMAL HIGH (ref 0.8–1.2)
Prothrombin Time: 16.3 seconds — ABNORMAL HIGH (ref 11.4–15.2)

## 2022-03-29 LAB — HEMOGLOBIN A1C
Hgb A1c MFr Bld: 6.2 % — ABNORMAL HIGH (ref 4.8–5.6)
Mean Plasma Glucose: 131.24 mg/dL

## 2022-03-29 LAB — GLUCOSE, CAPILLARY
Glucose-Capillary: 132 mg/dL — ABNORMAL HIGH (ref 70–99)
Glucose-Capillary: 105 mg/dL — ABNORMAL HIGH (ref 70–99)

## 2022-03-29 MED ORDER — ACETAMINOPHEN 325 MG PO TABS
650.0000 mg | ORAL_TABLET | Freq: Once | ORAL | Status: AC
  Administered 2022-03-29: 650 mg via ORAL
  Filled 2022-03-29: qty 2

## 2022-03-29 MED ORDER — OXYCODONE-ACETAMINOPHEN 5-325 MG PO TABS: 1.0000 | ORAL_TABLET | Freq: Four times a day (QID) | ORAL | 0 refills | Status: DC | PRN

## 2022-03-29 MED ORDER — OXYCODONE HCL 5 MG PO TABS: 5.0000 mg | ORAL_TABLET | Freq: Once | ORAL | Status: DC

## 2022-03-29 MED ORDER — HYDROCHLOROTHIAZIDE 25 MG PO TABS
25.0000 mg | ORAL_TABLET | Freq: Two times a day (BID) | ORAL | Status: DC
  Administered 2022-03-29 – 2022-03-30 (×2): 25 mg via ORAL
  Filled 2022-03-29 (×2): qty 1

## 2022-03-29 MED ORDER — LINAGLIPTIN 5 MG PO TABS
5.0000 mg | ORAL_TABLET | Freq: Every day | ORAL | Status: DC
  Administered 2022-03-29 – 2022-03-30 (×2): 5 mg via ORAL
  Filled 2022-03-29 (×2): qty 1

## 2022-03-29 MED ORDER — OXYCODONE HCL 5 MG PO TABS
5.0000 mg | ORAL_TABLET | Freq: Four times a day (QID) | ORAL | Status: DC | PRN
  Administered 2022-03-30: 5 mg via ORAL
  Filled 2022-03-29: qty 1

## 2022-03-29 MED ORDER — POLYVINYL ALCOHOL 1.4 % OP SOLN
1.0000 [drp] | OPHTHALMIC | Status: DC | PRN
Start: 2022-03-29 — End: 2022-03-30

## 2022-03-29 MED ORDER — HYDROMORPHONE HCL 1 MG/ML IJ SOLN
0.5000 mg | INTRAMUSCULAR | Status: DC | PRN
  Administered 2022-03-29 – 2022-03-30 (×3): 0.5 mg via INTRAVENOUS
  Filled 2022-03-29 (×3): qty 0.5

## 2022-03-29 MED ORDER — FUROSEMIDE 40 MG PO TABS
20.0000 mg | ORAL_TABLET | Freq: Every morning | ORAL | Status: DC
  Administered 2022-03-29: 20 mg via ORAL
  Filled 2022-03-29: qty 1

## 2022-03-29 MED ORDER — INSULIN ASPART 100 UNIT/ML IJ SOLN: 0.0000 [IU] | Freq: Every day | INTRAMUSCULAR | Status: DC

## 2022-03-29 MED ORDER — HYDROMORPHONE HCL 1 MG/ML IJ SOLN
1.0000 mg | Freq: Once | INTRAMUSCULAR | Status: AC
  Administered 2022-03-29: 1 mg via INTRAVENOUS
  Filled 2022-03-29: qty 1

## 2022-03-29 MED ORDER — ROSUVASTATIN CALCIUM 20 MG PO TABS
20.0000 mg | ORAL_TABLET | Freq: Every day | ORAL | Status: DC
  Administered 2022-03-29 – 2022-03-30 (×2): 20 mg via ORAL
  Filled 2022-03-29 (×2): qty 1

## 2022-03-29 MED ORDER — INSULIN ASPART 100 UNIT/ML IJ SOLN
4.0000 [IU] | Freq: Three times a day (TID) | INTRAMUSCULAR | Status: DC
  Administered 2022-03-29 – 2022-03-30 (×3): 4 [IU] via SUBCUTANEOUS

## 2022-03-29 MED ORDER — POTASSIUM CHLORIDE CRYS ER 20 MEQ PO TBCR
40.0000 meq | EXTENDED_RELEASE_TABLET | Freq: Once | ORAL | Status: AC
  Administered 2022-03-29: 40 meq via ORAL
  Filled 2022-03-29: qty 2

## 2022-03-29 MED ORDER — LIDOCAINE 5 % EX PTCH
1.0000 | MEDICATED_PATCH | CUTANEOUS | Status: DC
  Administered 2022-03-29 – 2022-03-30 (×2): 1 via TRANSDERMAL
  Filled 2022-03-29 (×2): qty 1

## 2022-03-29 MED ORDER — OXYCODONE HCL 5 MG PO TABS
10.0000 mg | ORAL_TABLET | Freq: Once | ORAL | Status: AC
  Administered 2022-03-29: 10 mg via ORAL
  Filled 2022-03-29: qty 2

## 2022-03-29 MED ORDER — INSULIN ASPART 100 UNIT/ML IJ SOLN
0.0000 [IU] | Freq: Three times a day (TID) | INTRAMUSCULAR | Status: DC
  Administered 2022-03-29 – 2022-03-30 (×2): 2 [IU] via SUBCUTANEOUS
  Administered 2022-03-30: 3 [IU] via SUBCUTANEOUS

## 2022-03-29 MED ORDER — METHOCARBAMOL 500 MG PO TABS: 500.0000 mg | ORAL_TABLET | Freq: Three times a day (TID) | ORAL | 0 refills | Status: AC | PRN

## 2022-03-29 MED ORDER — HYDROMORPHONE HCL 1 MG/ML IJ SOLN
1.0000 mg | Freq: Once | INTRAMUSCULAR | Status: AC
  Administered 2022-03-29: 1 mg via INTRAMUSCULAR
  Filled 2022-03-29: qty 1

## 2022-03-29 MED ORDER — IPRATROPIUM-ALBUTEROL 0.5-2.5 (3) MG/3ML IN SOLN: 3.0000 mL | RESPIRATORY_TRACT | Status: DC | PRN

## 2022-03-29 MED ORDER — IOHEXOL 300 MG/ML  SOLN
100.0000 mL | Freq: Once | INTRAMUSCULAR | Status: AC | PRN
  Administered 2022-03-29: 100 mL via INTRAVENOUS

## 2022-03-29 MED ORDER — ACETAMINOPHEN 650 MG RE SUPP: 650.0000 mg | Freq: Four times a day (QID) | RECTAL | Status: DC

## 2022-03-29 MED ORDER — ONDANSETRON HCL 4 MG/2ML IJ SOLN: 4.0000 mg | Freq: Four times a day (QID) | INTRAMUSCULAR | Status: DC | PRN

## 2022-03-29 MED ORDER — METHOCARBAMOL 500 MG PO TABS: 500.0000 mg | ORAL_TABLET | Freq: Three times a day (TID) | ORAL | 0 refills | Status: DC | PRN

## 2022-03-29 MED ORDER — LIDOCAINE 4 % EX PTCH: 1.0000 | MEDICATED_PATCH | CUTANEOUS | 0 refills | Status: DC

## 2022-03-29 MED ORDER — ACETAMINOPHEN 325 MG PO TABS
650.0000 mg | ORAL_TABLET | Freq: Four times a day (QID) | ORAL | Status: DC
  Administered 2022-03-29 – 2022-03-30 (×4): 650 mg via ORAL
  Filled 2022-03-29 (×5): qty 2

## 2022-03-29 MED ORDER — MAGNESIUM OXIDE -MG SUPPLEMENT 400 (240 MG) MG PO TABS
400.0000 mg | ORAL_TABLET | Freq: Every day | ORAL | Status: DC
  Administered 2022-03-29 – 2022-03-30 (×2): 400 mg via ORAL
  Filled 2022-03-29 (×2): qty 1

## 2022-03-29 MED ORDER — ONDANSETRON HCL 4 MG PO TABS: 4.0000 mg | ORAL_TABLET | Freq: Four times a day (QID) | ORAL | Status: DC | PRN

## 2022-03-29 MED ORDER — AMLODIPINE BESYLATE 5 MG PO TABS: 5.0000 mg | ORAL_TABLET | Freq: Every day | ORAL | Status: DC

## 2022-03-29 MED ORDER — ADULT MULTIVITAMIN W/MINERALS CH
1.0000 | ORAL_TABLET | Freq: Every day | ORAL | Status: DC
  Administered 2022-03-29 – 2022-03-30 (×2): 1 via ORAL
  Filled 2022-03-29 (×2): qty 1

## 2022-03-29 MED ORDER — LIDOCAINE 4 % EX PTCH: 1.0000 | MEDICATED_PATCH | CUTANEOUS | 0 refills | Status: AC

## 2022-03-29 MED ORDER — PANTOPRAZOLE SODIUM 40 MG PO TBEC
40.0000 mg | DELAYED_RELEASE_TABLET | Freq: Every day | ORAL | Status: DC
  Administered 2022-03-29 – 2022-03-30 (×2): 40 mg via ORAL
  Filled 2022-03-29 (×2): qty 1

## 2022-03-29 MED ORDER — TRAZODONE HCL 50 MG PO TABS: 50.0000 mg | ORAL_TABLET | Freq: Every evening | ORAL | Status: DC | PRN

## 2022-03-29 MED ORDER — METHOCARBAMOL 500 MG PO TABS
500.0000 mg | ORAL_TABLET | Freq: Once | ORAL | Status: AC
  Administered 2022-03-29: 500 mg via ORAL
  Filled 2022-03-29: qty 1

## 2022-03-29 MED ORDER — GABAPENTIN 100 MG PO CAPS
100.0000 mg | ORAL_CAPSULE | Freq: Three times a day (TID) | ORAL | Status: DC
  Administered 2022-03-29 – 2022-03-30 (×4): 100 mg via ORAL
  Filled 2022-03-29 (×4): qty 1

## 2022-03-29 MED ORDER — BUSPIRONE HCL 5 MG PO TABS
5.0000 mg | ORAL_TABLET | Freq: Two times a day (BID) | ORAL | Status: DC
  Administered 2022-03-29 – 2022-03-30 (×3): 5 mg via ORAL
  Filled 2022-03-29 (×3): qty 1

## 2022-03-29 MED ORDER — SENNOSIDES-DOCUSATE SODIUM 8.6-50 MG PO TABS
1.0000 | ORAL_TABLET | Freq: Every day | ORAL | Status: DC
  Administered 2022-03-29: 1 via ORAL
  Filled 2022-03-29: qty 1

## 2022-03-29 NOTE — ED Notes (Signed)
Pt placed on 2l o2 after sats dropping in the 70's. Pt began to panic and cry due to pain and concern for being home by herself. Breathing exercises and consoling with the addition of supplemental o2 the pt calmed and her sats came up to mid 90's. Pt and family expressed concern for dc. Pt feels unsafe for dc. Pt expressed pain at 10/10. MD notified.

## 2022-03-29 NOTE — ED Notes (Signed)
3rd attempt to call report unsuccessful.

## 2022-03-29 NOTE — ED Provider Notes (Signed)
[facility] Emergency Department Provider Note MRN:  [id number]  Arrival date & time: 03/29/22     Chief Complaint   Fall   History of Present Illness   Shelby Morgan is a 66 y.o. year-old female with no pertinent past medical history presenting to the ED with chief complaint of fall.  Patient stumbled out of bed and fell striking her ribs against the floor.  She heard a crack when she feels like they are broken.  Severe pain.  Hit her head against the carpet.  Takes blood thinners.  Review of Systems  A thorough review of systems was obtained and all systems are negative except as noted in the HPI and PMH.   Patient's Health History    Past Medical History:  Diagnosis Date   Anxiety    Bell's palsy 01/08/2011   Chronic back pain    Depression    Diabetes mellitus    Diverticulosis    GERD (gastroesophageal reflux disease)    Hemorrhoids    Hepatomegaly    Hypertension    Lumbar stenosis    L2/L3--followed by Dr. Ernestina Patches   Melanoma [facility]) 2009   Dr Nevada Crane, Stage 2, required only surgery   NAFLD (nonalcoholic fatty liver disease)    Right thalamic stroke ([place]) 01/14/2022   Skin cancer    Stroke ([name]) 07/2021    Past Surgical History:  Procedure Laterality Date   CESAREAN SECTION     COLONOSCOPY  03/09/2011   Dr Oneida Alar diverticulosis, internal and external hemorrhoids   ESOPHAGOGASTRODUODENOSCOPY  03/09/2011   Esophageal stricture dilated to 16 MM savory, NSAID- induced gastritis and duodenitis   LEG SURGERY     plates/screws/hx fx-right leg   MELANOMA EXCISION     right knee   SAVORY DILATION  03/09/2011    Family History  Problem Relation Age of Onset   Heart disease Mother    Heart disease Maternal Grandmother    Heart disease Maternal Grandfather    COPD Father    Bladder Cancer Father    Anesthesia problems Neg Hx    Hypotension Neg Hx    Malignant hyperthermia Neg Hx    Pseudochol deficiency Neg Hx    Colon cancer Neg Hx      Social History   Socioeconomic History   Marital status: Widowed    Spouse name: Not on file   Number of children: 1   Years of education: Not on file   Highest education level: Not on file  Occupational History   Occupation: mary k/bookeeper    Fish farm manager: SELF-EMPLOYED    Comment: part time  Tobacco Use   Smoking status: Former    Packs/day: 0.50    Years: 11.00    Total pack years: 5.50    Types: Cigarettes    Quit date: 03/03/1993    Years since quitting: 29.0   Smokeless tobacco: Never  Vaping Use   Vaping Use: Never used  Substance and Sexual Activity   Alcohol use: Yes    Comment: wine 2-3 glasses per weekend   Drug use: No   Sexual activity: Yes    Birth control/protection: Post-menopausal  Other Topics Concern   Not on file  Social History Narrative   Not on file   Social Determinants of Health   Financial Resource Strain: Unknown (06/07/2018)   Overall Financial Resource Strain (CARDIA)    Difficulty of Paying Living Expenses: Patient refused  Food Insecurity: Unknown (06/07/2018)   Hunger Vital Sign  Worried About Charity fundraiser in the Last Year: Patient refused    Arboriculturist in the Last Year: Patient refused  Transportation Needs: Unknown (06/07/2018)   PRAPARE - Hydrologist (Medical): Patient refused    Lack of Transportation (Non-Medical): Patient refused  Physical Activity: Unknown (06/07/2018)   Exercise Vital Sign    Days of Exercise per Week: Patient refused    Minutes of Exercise per Session: Patient refused  Stress: Unknown (06/07/2018)   Overly of Stress : Patient refused  Social Connections: Unknown (06/07/2018)   Social Connection and Isolation Panel [NHANES]    Frequency of Communication with Friends and Family: Patient refused    Frequency of Social Gatherings with Friends and Family: Patient refused    Attends  Religious Services: Patient refused    Active Member of Clubs or Organizations: Patient refused    Attends Archivist Meetings: Patient refused    Marital Status: Patient refused  Intimate Partner Violence: Unknown (06/07/2018)   Humiliation, Afraid, Rape, and Kick questionnaire    Fear of Current or Ex-Partner: Patient refused    Emotionally Abused: Patient refused    Physically Abused: Patient refused    Sexually Abused: Patient refused     Physical Exam   Vitals:   03/29/22 0612 03/29/22 0617  BP: (!) 172/84   Pulse: 78 78  Resp: 19   Temp:    SpO2: 98% 98%    CONSTITUTIONAL: Well-appearing, in moderate distress due to pain NEURO/PSYCH:  Alert and oriented x 3, no focal deficits EYES:  eyes equal and reactive ENT/NECK:  no LAD, no JVD CARDIO: Regular rate, well-perfused, normal S1 and S2 PULM:  CTAB no wheezing or rhonchi GI/GU:  non-distended, non-tender MSK/SPINE:  No gross deformities, no edema, tender to the right anterior and lateral ribs SKIN:  no rash, atraumatic   *Additional and/or pertinent findings included in MDM below  Diagnostic and Interventional Summary    EKG Interpretation  Date/Time:    Ventricular Rate:    PR Interval:    QRS Duration:   QT Interval:    QTC Calculation:   R Axis:     Text Interpretation:        None Labs Reviewed  CBC  COMPREHENSIVE METABOLIC PANEL    DG Shoulder Right Portable  Final Result    DG Chest Port 1 View  Final Result    CT HEAD WO CONTRAST (5MM)    (Results Pending)  CT ABDOMEN PELVIS W CONTRAST    (Results Pending)  CT CHEST W CONTRAST    (Results Pending)    Medications  HYDROmorphone (DILAUDID) injection 1 mg (has no administration in time range)  HYDROmorphone (DILAUDID) injection 1 mg (1 mg Intramuscular Given 03/29/22 0612)     Procedures  /  Critical Care Procedures  ED Course and Medical Decision Making  Initial Impression and Ddx Suspicious for rib fracture, DDx also  includes pneumothorax, blunt liver injury.  Past medical/surgical history that increases complexity of ED encounter: Anticoagulated, history of A-fib on Eliquis  Interpretation of Diagnostics I personally reviewed the chest x-ray and my interpretation is as follows: No pneumothorax  There is evidence of acute rib fractures on the right.  Patient Reassessment and Ultimate Disposition/Management     Awaiting labs, CT imaging to exclude more significant intra-abdominal or intrathoracic injury or any active bleeding given the anticoagulation.  Providing further pain control.  If reassuring work-up and pain well controlled would be appropriate for discharge.  Signed out to oncoming provider.  Patient management required discussion with the following services or consulting groups:  None  Complexity of Problems Addressed Acute illness or injury that poses threat of life of bodily function  Additional Data Reviewed and Analyzed Further history obtained from: Further history from spouse/family member  Additional Factors Impacting ED Encounter Risk Use of parenteral controlled substances  Barth Kirks. [place] Small, [place] [email or web address]  Final Clinical Impressions(s) / ED Diagnoses     ICD-10-CM   1. Closed fracture of multiple ribs of right side, initial encounter  S22.41XA       ED Discharge Orders     None        Discharge Instructions Discussed with and Provided to Patient:   Discharge Instructions   None      Maudie Flakes, MD 03/29/22 [id number]

## 2022-03-29 NOTE — Progress Notes (Signed)
Pt setup on CPAP in Auto titrate mode with 3pm cann bleed in. Pt tolerating well RT will continue to monitor

## 2022-03-29 NOTE — Hospital Course (Signed)
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  She reportedly had a fall at home living independently while using walker coming from the bathroom.  She complains of pain to the right chest rib cage and right shoulder blade.  She reports pleuritic chest pain.  She reported that she heard a pop when she fell and landed on the floor.  She was worked up in the ED and noted to have a single mildly displaced right fifth rib fracture.  She was treated with pain medication and a lidocaine patch.  She was started on incentive spirometer.  The ED initially attempted to send her home however she started crying and complaining of uncontrolled pain and expressed concern for discharge.  While she was crying her oxygen saturation dropped into the 70s and she was placed on 2 L supplemental oxygen nasal cannula.  Patient reports that she lives alone and cannot manage at home and reports pain is uncontrolled.  Admission was requested for further management.

## 2022-03-29 NOTE — Discharge Instructions (Signed)
Take Tylenol every 6 hours.    Additional medications were sent to your pharmacy for pain control at home: -Lidocaine patches can be applied to area of pain once per day.  Avoid having more than 1 of these patches on your skin at any given time.   -Robaxin is a muscle relaxer.  Take this as needed.   -Percocet is a narcotic pain medication.  Take this as needed.    Avoid combination of muscle relaxers and narcotic pain medications.  Both of these medications can cause drowsiness and lead to further falls or accidents.  Take with caution.    USE INCENTIVE SPIROMETER EVERY 1-2  HOURS WHILE AWAKE FOR NEXT 7 DAYS  Continue to utilize the incentive spirometer which will encourage you to take deep breaths and avoid developing a pneumonia.  Return to the emergency department for any worsening of symptoms.   Please follow up with hematology oncology to have a PET scan done to follow up mediastinal adenopathy.   Mediastinal adenopathy/lymphadenopathy is the enlargement of lymph nodes in the central part of the chest. Lymph nodes may be enlarged secondary to infection, injury, blockage or cancer. This is usually an incidental finding on routine or follow up chest imaging (chest xray or CT scan).

## 2022-03-29 NOTE — ED Notes (Addendum)
Took to bathroom via wheelchair

## 2022-03-29 NOTE — ED Notes (Signed)
Unsuccessful attempt X2 to call report.

## 2022-03-29 NOTE — H&P (Addendum)
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  She reportedly had a fall at home living independently while using walker coming from the bathroom.  She complains of pain to the right chest rib cage and right shoulder blade.  She reports pleuritic chest pain.  She reported that she heard a pop when she fell and landed on the floor.  She was worked up in the ED and noted to have a single mildly displaced right fifth rib fracture.  She was treated with pain medication and a lidocaine patch.  She was started on incentive spirometer.  The ED initially attempted to send her home however she started crying and complaining of uncontrolled pain and expressed concern for discharge.  While she was crying her oxygen saturation dropped into the 70s and she was placed on 2 L supplemental oxygen nasal cannula.  Patient reports that she lives alone and cannot manage at home and reports pain is uncontrolled.  Admission was requested for further management.  Review of Systems: Review of Systems  Constitutional: Negative.   HENT: Negative.    Eyes: Negative.   Respiratory:  Positive for shortness of breath.   Cardiovascular: Negative.   Gastrointestinal:  Positive for heartburn.  Musculoskeletal:   Positive for falls.  Skin: Negative.   Neurological: Negative.   Endo/Heme/Allergies: Negative.   Psychiatric/Behavioral:  The patient is nervous/anxious.   All other systems reviewed and are negative.    Past Medical History:  Diagnosis Date   Anxiety    Bell's palsy 01/08/2011   Chronic back pain    Depression    Diabetes mellitus    Diverticulosis    GERD (gastroesophageal reflux disease)    Hemorrhoids    Hepatomegaly    Hypertension    Lumbar stenosis    L2/L3--followed by Dr. Ernestina Patches   Melanoma [facility]) 2009   Dr Nevada Crane, Stage 2, required only surgery   NAFLD (nonalcoholic fatty liver disease)    Right thalamic stroke ([place]) 01/14/2022   Skin cancer    Stroke ([place]) 07/2021    Past Surgical History:  Procedure Laterality Date   CESAREAN SECTION     COLONOSCOPY  03/09/2011   Dr Oneida Alar diverticulosis, internal and external hemorrhoids   ESOPHAGOGASTRODUODENOSCOPY  03/09/2011   Esophageal stricture dilated to 16 MM savory, NSAID- induced gastritis and duodenitis   LEG SURGERY     plates/screws/hx fx-right leg   MELANOMA EXCISION     right knee   SAVORY DILATION  03/09/2011     reports that she quit smoking about 29 years ago. Her smoking use included cigarettes. She has a 5.50 pack-year smoking history. She has never used smokeless tobacco. She reports current alcohol use. She reports that she does not use drugs.  Allergies  Allergen Reactions   Amlodipine Swelling   Codeine Nausea And  Vomiting    Family History  Problem Relation Age of Onset   Heart disease Mother    Heart disease Maternal Grandmother    Heart disease Maternal Grandfather    COPD Father    Bladder Cancer Father    Anesthesia problems Neg Hx    Hypotension Neg Hx    Malignant hyperthermia Neg Hx    Pseudochol deficiency Neg Hx    Colon cancer Neg Hx     Prior to Admission medications   Medication Sig Start Date End Date Taking? Authorizing Provider  acetaminophen (TYLENOL) 325 MG tablet  Take 650 mg by mouth every 6 (six) hours as needed for moderate pain or mild pain.   Yes [provider]  busPIRone (BUSPAR) 5 MG tablet TAKE 1 TABLET BY MOUTH TWICE A DAY 02/20/22  Yes Kirsteins, Luanna Salk, MD  COLLAGEN PO Take 1 capsule by mouth daily.   Yes [provider]  diclofenac Sodium (VOLTAREN) 1 % GEL Apply 4 g topically 4 (four) times daily. 01/28/22  Yes Love, Ivan Anchors, PA-C  furosemide (LASIX) 20 MG tablet Take 1 tablet (20 mg total) by mouth every morning. 01/28/22  Yes Love, Ivan Anchors, PA-C  gabapentin (NEURONTIN) 100 MG capsule Take 1 capsule (100 mg total) by mouth 3 (three) times daily. 01/28/22  Yes Love, Ivan Anchors, PA-C  glimepiride (AMARYL) 2 MG tablet Take 2 mg by mouth daily. 02/04/22  Yes [provider]  hydrochlorothiazide (HYDRODIURIL) 25 MG tablet Take 25 mg by mouth 2 (two) times daily. 02/23/22  Yes [provider]  Magnesium 400 MG CAPS Take 400 mg by mouth daily.   Yes [provider]  Multiple Vitamins-Minerals (CENTRUM SILVER 50+WOMEN) TABS Take 1 tablet by mouth daily.   Yes [provider]  pantoprazole (PROTONIX) 40 MG tablet Take 1 tablet (40 mg total) by mouth daily. 01/28/22  Yes Love, Ivan Anchors, PA-C  polyvinyl alcohol (LIQUIFILM TEARS) 1.4 % ophthalmic solution Place 1 drop into the left eye as needed for dry eyes. 01/28/22  Yes Love, Ivan Anchors, PA-C  rosuvastatin (CRESTOR) 20 MG tablet Take 1 tablet (20 mg total) by mouth daily. 01/28/22  Yes Love, Parnika S, PA-C  TRADJENTA 5 MG TABS tablet TAKE 1 TABLET (5 MG TOTAL) BY MOUTH DAILY. 03/13/22  Yes Jennye Boroughs, MD  traZODone (DESYREL) 50 MG tablet Take 1 tablet (50 mg total) by mouth at bedtime as needed for sleep. 01/28/22  Yes Love, Ivan Anchors, PA-C  warfarin (COUMADIN) 5 MG tablet Take 5 mg by mouth daily. 03/19/22  Yes [provider]  amLODipine (NORVASC) 5 MG tablet Take 1 tablet (5 mg total) by mouth daily. Patient not taking: Reported on 03/29/2022 02/17/22    Oswald Hillock, MD  apixaban (ELIQUIS) 5 MG TABS tablet Take 1 tablet (5 mg total) by mouth 2 (two) times daily. Patient not taking: Reported on 03/29/2022 02/15/22 03/17/22  George Hugh, MD  lidocaine 4 % Place 1 patch onto the skin daily. 03/29/22   Godfrey Pick, MD  methocarbamol (ROBAXIN) 500 MG tablet Take 1 tablet (500 mg total) by mouth every 8 (eight) hours as needed for muscle spasms. 03/29/22   Godfrey Pick, MD  oxyCODONE-acetaminophen (PERCOCET/ROXICET) 5-325 MG tablet Take 1 tablet by mouth every 6 (six) hours as needed for severe pain. 03/29/22   Godfrey Pick, MD    Physical Exam: Vitals:   03/29/22 1156 03/29/22 1200 03/29/22 1229 03/29/22 1234  BP:  (!) 142/82  127/66  Pulse:  82  84  Resp:  19  19  Temp:   99.4 F (37.4 C)   TempSrc:   Oral   SpO2: 98% 99%  98%  Weight:      Height:        Constitutional: awake, alert, not distressed.   Eyes: PERRL, lids and conjunctivae normal ENMT: Mucous membranes are moist. Posterior pharynx clear of any exudate or lesions.Normal dentition.  Neck: normal, supple, no masses, no thyromegaly Respiratory: shallow breathing due to pain, right 5th rib bruising, painful, edematous, No accessory muscle use.  Cardiovascular: normal s1, s2 sounds, no murmurs / rubs / gallops. No extremity edema. 2+ pedal pulses. No carotid bruits.  Abdomen: no tenderness, no masses palpated. No hepatosplenomegaly. Bowel sounds positive.  Musculoskeletal: no clubbing / cyanosis. No joint deformity upper and lower extremities. Good ROM, no contractures. Normal muscle tone.  Skin: no rashes, lesions, ulcers. No induration Neurologic: CN 2-12 grossly intact. Sensation intact, DTR normal. Strength 5/5 in all 4.  Psychiatric: Normal judgment and insight. Alert and oriented x 3. Anxious mood.   Labs on Admission: I have personally reviewed following labs and imaging studies  CBC: Recent Labs  Lab 03/29/22 0652  WBC 13.6*  HGB 11.3*  HCT 35.5*  MCV 86.4  PLT 716    Basic Metabolic Panel: Recent Labs  Lab 03/29/22 0652  NA 136  K 3.3*  CL 99  CO2 26  GLUCOSE 233*  BUN 26*  CREATININE 1.07*  CALCIUM 9.3   GFR: Estimated Creatinine Clearance: 58 mL/min (A) (by C-G formula based on SCr of 1.07 mg/dL (H)). Liver Function Tests: Recent Labs  Lab 03/29/22 0652  AST 35  ALT 28  ALKPHOS 53  BILITOT 0.7  PROT 7.8  ALBUMIN 4.0   No results for input(s): "LIPASE", "AMYLASE" in the last 168 hours. No results for input(s): "AMMONIA" in the last 168 hours. Coagulation Profile: No results for input(s): "INR", "PROTIME" in the last 168 hours. Cardiac Enzymes: No results for input(s): "CKTOTAL", "CKMB", "CKMBINDEX", "TROPONINI" in the last 168 hours. BNP (last 3 results) No results for input(s): "PROBNP" in the last 8760 hours. HbA1C: No results for input(s): "HGBA1C" in the last 72 hours. CBG: No results for input(s): "GLUCAP" in the last 168 hours. Lipid Profile: No results for input(s): "CHOL", "HDL", "LDLCALC", "TRIG", "CHOLHDL", "LDLDIRECT" in the last 72 hours. Thyroid Function Tests: No results for input(s): "TSH", "T4TOTAL", "FREET4", "T3FREE", "THYROIDAB" in the last 72 hours. Anemia Panel: No results for input(s): "VITAMINB12", "FOLATE", "FERRITIN", "TIBC", "IRON", "RETICCTPCT" in the last 72 hours. Urine analysis:    Component Value Date/Time   COLORURINE YELLOW 01/06/2022 1927   APPEARANCEUR CLEAR 01/06/2022 1927   LABSPEC 1.005 01/06/2022 1927   PHURINE 6.0 01/06/2022 1927   GLUCOSEU NEGATIVE 01/06/2022 1927   HGBUR NEGATIVE 01/06/2022 1927   BILIRUBINUR NEGATIVE 01/06/2022 1927   KETONESUR NEGATIVE 01/06/2022 1927   PROTEINUR NEGATIVE 01/06/2022 1927   UROBILINOGEN 0.2 01/08/2011 1649   NITRITE NEGATIVE 01/06/2022 1927   LEUKOCYTESUR NEGATIVE 01/06/2022 1927    Radiological Exams on Admission: CT ABDOMEN PELVIS W CONTRAST  Result Date: 03/29/2022 CLINICAL DATA:  Blunt trauma after fall. EXAM: CT CHEST, ABDOMEN, AND  PELVIS WITH CONTRAST TECHNIQUE: Multidetector CT imaging of the chest, abdomen and pelvis was performed following the standard protocol during bolus administration of intravenous contrast. RADIATION DOSE REDUCTION: This exam was performed according to the departmental dose-optimization program which includes automated exposure control, adjustment of the mA and/or  kV according to patient size and/or use of iterative reconstruction technique. CONTRAST:  153m OMNIPAQUE IOHEXOL 300 MG/ML  SOLN COMPARISON:  February 08, 2022.  August 05, 2018. FINDINGS: CT CHEST FINDINGS Cardiovascular: No significant vascular findings. Normal heart size. No pericardial effusion. Mediastinum/Nodes: Esophagus is unremarkable. 8 mm right thyroid nodule is noted. There is again noted enlarged mediastinal adenopathy. 1.9 cm aorta pulmonary window adenopathy is noted which is slightly enlarged compared to prior exam where it measured 1.7 cm. 2 cm subcarinal adenopathy is noted which is slightly enlarged compared to prior exam where it measured 1.2 cm. 1 cm right paratracheal lymph node is noted which is slightly enlarged compared to prior exam where it measured 5 mm. 11 mm right hilar lymph node is noted which is enlarged. Grossly stable 1 cm left hilar lymph node is noted. Lungs/Pleura: No pneumothorax or pleural effusion is noted. Mild bibasilar subsegmental atelectasis is noted. Musculoskeletal: Interval development of mildly displaced fracture involving the posterolateral portion of the right fifth rib. CT ABDOMEN PELVIS FINDINGS Hepatobiliary: No focal liver abnormality is seen. No gallstones, gallbladder wall thickening, or biliary dilatation. Pancreas: Unremarkable. No pancreatic ductal dilatation or surrounding inflammatory changes. Spleen: Normal in size without focal abnormality. Adrenals/Urinary Tract: Adrenal glands are unremarkable. Kidneys are normal, without renal calculi, focal lesion, or hydronephrosis. Bladder is unremarkable.  Stomach/Bowel: Stomach is within normal limits. Appendix appears normal. No evidence of bowel wall thickening, distention, or inflammatory changes. Vascular/Lymphatic: Aortic atherosclerosis. No enlarged abdominal or pelvic lymph nodes. Reproductive: Uterus and bilateral adnexa are unremarkable. Other: No abdominal wall hernia or abnormality. No abdominopelvic ascites. Musculoskeletal: Multilevel degenerative disc disease is noted in the lumbar spine. No acute abnormality is noted. IMPRESSION: Mildly displaced right fifth rib fracture. Mildly increased mediastinal adenopathy is noted compared to prior exam. This is concerning for possible malignancy or metastatic disease. PET scan may be considered for further evaluation. 8 mm right thyroid nodule. Not clinically significant; no follow-up imaging recommended. (Ref: J Am Coll Radiol. 2015 Feb;12(2): 143-50). No acute abnormality seen in the abdomen or pelvis. Aortic Atherosclerosis (ICD10-I70.0). Electronically Signed   By: JMarijo ConceptionM.D.   On: 03/29/2022 09:24   CT CHEST W CONTRAST  Result Date: 03/29/2022 CLINICAL DATA:  Blunt trauma after fall. EXAM: CT CHEST, ABDOMEN, AND PELVIS WITH CONTRAST TECHNIQUE: Multidetector CT imaging of the chest, abdomen and pelvis was performed following the standard protocol during bolus administration of intravenous contrast. RADIATION DOSE REDUCTION: This exam was performed according to the departmental dose-optimization program which includes automated exposure control, adjustment of the mA and/or kV according to patient size and/or use of iterative reconstruction technique. CONTRAST:  1027mOMNIPAQUE IOHEXOL 300 MG/ML  SOLN COMPARISON:  February 08, 2022.  August 05, 2018. FINDINGS: CT CHEST FINDINGS Cardiovascular: No significant vascular findings. Normal heart size. No pericardial effusion. Mediastinum/Nodes: Esophagus is unremarkable. 8 mm right thyroid nodule is noted. There is again noted enlarged mediastinal  adenopathy. 1.9 cm aorta pulmonary window adenopathy is noted which is slightly enlarged compared to prior exam where it measured 1.7 cm. 2 cm subcarinal adenopathy is noted which is slightly enlarged compared to prior exam where it measured 1.2 cm. 1 cm right paratracheal lymph node is noted which is slightly enlarged compared to prior exam where it measured 5 mm. 11 mm right hilar lymph node is noted which is enlarged. Grossly stable 1 cm left hilar lymph node is noted. Lungs/Pleura: No pneumothorax or pleural effusion is noted. Mild bibasilar subsegmental atelectasis  is noted. Musculoskeletal: Interval development of mildly displaced fracture involving the posterolateral portion of the right fifth rib. CT ABDOMEN PELVIS FINDINGS Hepatobiliary: No focal liver abnormality is seen. No gallstones, gallbladder wall thickening, or biliary dilatation. Pancreas: Unremarkable. No pancreatic ductal dilatation or surrounding inflammatory changes. Spleen: Normal in size without focal abnormality. Adrenals/Urinary Tract: Adrenal glands are unremarkable. Kidneys are normal, without renal calculi, focal lesion, or hydronephrosis. Bladder is unremarkable. Stomach/Bowel: Stomach is within normal limits. Appendix appears normal. No evidence of bowel wall thickening, distention, or inflammatory changes. Vascular/Lymphatic: Aortic atherosclerosis. No enlarged abdominal or pelvic lymph nodes. Reproductive: Uterus and bilateral adnexa are unremarkable. Other: No abdominal wall hernia or abnormality. No abdominopelvic ascites. Musculoskeletal: Multilevel degenerative disc disease is noted in the lumbar spine. No acute abnormality is noted. IMPRESSION: Mildly displaced right fifth rib fracture. Mildly increased mediastinal adenopathy is noted compared to prior exam. This is concerning for possible malignancy or metastatic disease. PET scan may be considered for further evaluation. 8 mm right thyroid nodule. Not clinically significant;  no follow-up imaging recommended. (Ref: J Am Coll Radiol. 2015 Feb;12(2): 143-50). No acute abnormality seen in the abdomen or pelvis. Aortic Atherosclerosis (ICD10-I70.0). Electronically Signed   By: Marijo Conception M.D.   On: 03/29/2022 09:24   CT HEAD WO CONTRAST (5MM)  Result Date: 03/29/2022 CLINICAL DATA:  66 year old female status post fall with pain. Right thalamic lacune in June. EXAM: CT HEAD WITHOUT CONTRAST TECHNIQUE: Contiguous axial images were obtained from the base of the skull through the vertex without intravenous contrast. RADIATION DOSE REDUCTION: This exam was performed according to the departmental dose-optimization program which includes automated exposure control, adjustment of the mA and/or kV according to patient size and/or use of iterative reconstruction technique. COMPARISON:  Head CT 01/07/2022.  Brain MRI 01/06/2022. FINDINGS: Brain: Expected evolution of right thalamic lacunar infarct (series 2, image 15). No midline shift, ventriculomegaly, mass effect, evidence of mass lesion, intracranial hemorrhage or evidence of cortically based acute infarction. Stable gray-white matter differentiation outside of the right thalamus. Incidental right tentorial dural calcification. Vascular: Calcified atherosclerosis at the skull base. No suspicious intracranial vascular hyperdensity. Skull: Stable hyperostosis. No acute osseous abnormality identified. Sinuses/Orbits: Visualized paranasal sinuses and mastoids are stable and well aerated. Other: No acute orbit or scalp soft tissue injury identified. IMPRESSION: 1. No acute intracranial abnormality or acute traumatic injury identified. 2. Expected evolution of right thalamic infarct since June. Electronically Signed   By: Genevie Ann M.D.   On: 03/29/2022 07:06   DG Shoulder Right Portable  Result Date: 03/29/2022 CLINICAL DATA:  Fall.  Shoulder pain. EXAM: RIGHT SHOULDER - 1 VIEW COMPARISON:  None Available. FINDINGS: No evidence of an acute  fracture. No evidence for shoulder separation or dislocation. Degenerative changes are noted in the glenohumeral and acromioclavicular joints. Calcific tendinitis noted in the rotator cuff. IMPRESSION: 1. No acute bony abnormality. 2. Degenerative changes in the glenohumeral and acromioclavicular joints. Electronically Signed   By: Misty Stanley M.D.   On: 03/29/2022 06:21   DG Chest Port 1 View  Result Date: 03/29/2022 CLINICAL DATA:  Fall. EXAM: PORTABLE CHEST 1 VIEW COMPARISON:  02/08/2022 FINDINGS: 0606 hours. The lungs are clear without focal pneumonia, edema, pneumothorax or pleural effusion. There is pulmonary vascular congestion without overt pulmonary edema. Cardiopericardial silhouette is at upper limits of normal for size. Minimally displaced fracture posterolateral right fifth rib may be acute. Cortical off step on the posterolateral right sixth rib also suggest fracture. IMPRESSION: 1. Fractures  of the posterolateral right fifth and sixth ribs are likely acute and are new since 02/08/2022. 2. No pneumothorax or pleural effusion. Electronically Signed   By: Misty Stanley M.D.   On: 03/29/2022 06:20    EKG: Independently reviewed.  Assessment/Plan Principal Problem:   Acute respiratory failure with hypoxia (HCC) Active Problems:   Uncontrolled pain   Anxiety   GERD (gastroesophageal reflux disease)   NAFLD (nonalcoholic fatty liver disease)   Obesity   Diabetes mellitus type 2 in obese (HCC)   Hypokalemia   Mediastinal adenopathy   History of CVA (cerebrovascular accident)   Generalized anxiety disorder   Leukocytosis   Physical deconditioning   Rib fracture   Acquired thrombophilia (HCC)   Acute respiratory failure with hypoxia  - pulse ox down to 70% with activity - secondary to shallow breathing due to acute rib fracture right 5th rib - supplemental oxygen 2L/min, wean as able   Acute right 5th rib fracture, mildly displaced - pain management as ordered - incentive  spirometry ordered  Fall at home / Physical debility and deconditioning - Pt recently had a stent at [place] in June and SNF - will request a PT evaluation and TOC consultation   Uncontrolled pain - Pt claims pain still 10/10 after IV hydromorphone, lidocaine patch, and robaxin   Recent DVT / Acquired thrombophilia  - remains on warfarin for full anticoagulation - pharm D consulted to help with warfarin management  - check PT/INR   Type 2 DM  - SSI coverage and CBG monitoring ordered  Hypokalemia - oral replacement given   GERD  - protonix for GI protection ordered   Uncontrolled pain - IV, oral and topical pain treatments ordered    Mediastinal adenopathy - Pt will need a PET scan outpatient  DVT prophylaxis: warfarin  Code Status: Full   Family Communication:   Disposition Plan: TBD  Consults called: PT   Admission status: OBV  Level of care: Med-Surg Irwin Brakeman MD [facility] How to contact the [facility] Attending or Consulting provider 7A - 7P or covering provider during after hours 7P -7A, for this patient?  Check the care team in [facility] and look for a) attending/consulting [facility] provider listed and b) the [facility] team listed Log into [email or web address] and use [place]'s universal password to access. If you do not have the password, please contact the hospital operator. Locate the [facility] provider you are looking for under [facility] and page to a number that you can be directly reached. If you still have difficulty reaching the provider, please page the [facility] (Director on Call) for the Hospitalists listed on amion for assistance.   If 7PM-7AM, please contact night-coverage [email or web address] Password [facility]  03/29/2022, 12:53 PM

## 2022-03-29 NOTE — ED Triage Notes (Signed)
Pt here after fall while using walker coming from bathroom. Pt here with c/o pain to Right rib area as well as pain to Right shoulder blade. Pt states it hurts to take a deep breath and that she heard a "pop" when she landed.

## 2022-03-29 NOTE — ED Provider Notes (Addendum)
Care of patient assumed from Dr. Sedonia Small at 7 AM.  This patient, who is on anticoagulation, presented after a fall from standing while ambulating to the bathroom.  She believes that she struck the right side of her chest on her walker handle.  Currently awaiting imaging studies. Physical Exam  BP (!) 172/84   Pulse 78   Temp 98.1 F (36.7 C) (Oral)   Resp 19   Ht '5\' 2"'$  (1.575 m)   Wt 100 kg   SpO2 98%   BMI 40.32 kg/m   Physical Exam Vitals and nursing note reviewed.  Constitutional:      General: She is not in acute distress.    Appearance: Normal appearance. She is well-developed. She is not ill-appearing, toxic-appearing or diaphoretic.  HENT:     Head: Normocephalic and atraumatic.     Right Ear: External ear normal.     Left Ear: External ear normal.     Nose: Nose normal.     Mouth/Throat:     Mouth: Mucous membranes are moist.     Pharynx: Oropharynx is clear.  Eyes:     Extraocular Movements: Extraocular movements intact.     Conjunctiva/sclera: Conjunctivae normal.  Cardiovascular:     Rate and Rhythm: Normal rate and regular rhythm.  Pulmonary:     Effort: Pulmonary effort is normal. No respiratory distress.  Abdominal:     General: There is no distension.     Palpations: Abdomen is soft.     Tenderness: There is no abdominal tenderness.  Musculoskeletal:        General: No swelling or deformity. Normal range of motion.     Cervical back: Normal range of motion and neck supple.  Skin:    General: Skin is warm and dry.     Capillary Refill: Capillary refill takes less than 2 seconds.  Neurological:     General: No focal deficit present.     Mental Status: She is alert and oriented to person, place, and time.  Psychiatric:        Mood and Affect: Mood normal.        Behavior: Behavior normal.     Procedures  Procedures  ED Course / MDM    Medical Decision Making Amount and/or Complexity of Data Reviewed Labs: ordered. Radiology: ordered.  Risk OTC  drugs. Prescription drug management. Decision regarding hospitalization.   On assessment, patient sitting up in bed, resting comfortably.  Breathing is unlabored and SPO2 is normal on room air.  CT imaging showed a single fifth rib fracture on the right side.  Her pain remained controlled while in the ED.  She was given Tylenol and a lidocaine patch for additional analgesia.  She was given incentive spirometer.  She was able to pull greater than 1000 on it.  She initially felt comfortable with discharge home.  As needed muscle relaxer and narcotic pain medication were prescribed.  Patient was advised to continue Tylenol every 6 hours and lidocaine patch daily.  When she moved in the ED, she did have worsening of pain.  She also developed hypoxia with movement with SPO2 in the 70s on room air.  Patient was given Roxicodone for continued analgesia.  Given her worsening symptoms with movement, patient was admitted for pain control and pulmonary toilet.         Godfrey Pick, MD 03/29/22 [phone]

## 2022-03-30 DIAGNOSIS — K219 Gastro-esophageal reflux disease without esophagitis: Secondary | ICD-10-CM | POA: Diagnosis not present

## 2022-03-30 DIAGNOSIS — D6869 Other thrombophilia: Secondary | ICD-10-CM | POA: Diagnosis not present

## 2022-03-30 DIAGNOSIS — J9601 Acute respiratory failure with hypoxia: Secondary | ICD-10-CM | POA: Diagnosis not present

## 2022-03-30 DIAGNOSIS — Z6841 Body Mass Index (BMI) 40.0 and over, adult: Secondary | ICD-10-CM | POA: Diagnosis not present

## 2022-03-30 DIAGNOSIS — K760 Fatty (change of) liver, not elsewhere classified: Secondary | ICD-10-CM | POA: Diagnosis not present

## 2022-03-30 DIAGNOSIS — E876 Hypokalemia: Secondary | ICD-10-CM | POA: Diagnosis not present

## 2022-03-30 DIAGNOSIS — E1169 Type 2 diabetes mellitus with other specified complication: Secondary | ICD-10-CM | POA: Diagnosis not present

## 2022-03-30 DIAGNOSIS — E669 Obesity, unspecified: Secondary | ICD-10-CM | POA: Diagnosis not present

## 2022-03-30 DIAGNOSIS — R590 Localized enlarged lymph nodes: Secondary | ICD-10-CM | POA: Diagnosis not present

## 2022-03-30 DIAGNOSIS — F411 Generalized anxiety disorder: Secondary | ICD-10-CM | POA: Diagnosis not present

## 2022-03-30 LAB — CBC WITH DIFFERENTIAL/PLATELET
Abs Immature Granulocytes: 0.03 10*3/uL (ref 0.00–0.07)
Basophils Absolute: 0 10*3/uL (ref 0.0–0.1)
Basophils Relative: 1 %
Eosinophils Absolute: 0.4 10*3/uL (ref 0.0–0.5)
Eosinophils Relative: 5 %
HCT: 32.8 % — ABNORMAL LOW (ref 36.0–46.0)
Hemoglobin: 10.5 g/dL — ABNORMAL LOW (ref 12.0–15.0)
Immature Granulocytes: 0 %
Lymphocytes Relative: 20 %
Lymphs Abs: 1.7 10*3/uL (ref 0.7–4.0)
MCH: 27.6 pg (ref 26.0–34.0)
MCHC: 32 g/dL (ref 30.0–36.0)
MCV: 86.1 fL (ref 80.0–100.0)
Monocytes Absolute: 0.9 10*3/uL (ref 0.1–1.0)
Monocytes Relative: 11 %
Neutro Abs: 5.4 10*3/uL (ref 1.7–7.7)
Neutrophils Relative %: 63 %
Platelets: 276 10*3/uL (ref 150–400)
RBC: 3.81 MIL/uL — ABNORMAL LOW (ref 3.87–5.11)
RDW: 15.3 % (ref 11.5–15.5)
WBC: 8.4 10*3/uL (ref 4.0–10.5)
nRBC: 0 % (ref 0.0–0.2)

## 2022-03-30 LAB — BASIC METABOLIC PANEL
Anion gap: 9 (ref 5–15)
BUN: 17 mg/dL (ref 8–23)
CO2: 28 mmol/L (ref 22–32)
Calcium: 8.9 mg/dL (ref 8.9–10.3)
Chloride: 98 mmol/L (ref 98–111)
Creatinine, Ser: 0.92 mg/dL (ref 0.44–1.00)
GFR, Estimated: >60 mL/min (ref >60)
Glucose, Bld: 140 mg/dL — ABNORMAL HIGH (ref 70–99)
Potassium: 3.3 mmol/L — ABNORMAL LOW (ref 3.5–5.1)
Sodium: 135 mmol/L (ref 135–145)

## 2022-03-30 LAB — GLUCOSE, CAPILLARY
Glucose-Capillary: 130 mg/dL — ABNORMAL HIGH (ref 70–99)
Glucose-Capillary: 146 mg/dL — ABNORMAL HIGH (ref 70–99)
Glucose-Capillary: 164 mg/dL — ABNORMAL HIGH (ref 70–99)

## 2022-03-30 LAB — MAGNESIUM: Magnesium: 1.8 mg/dL (ref 1.7–2.4)

## 2022-03-30 LAB — PROTIME-INR
INR: 1.3 — ABNORMAL HIGH (ref 0.8–1.2)
Prothrombin Time: 16.4 seconds — ABNORMAL HIGH (ref 11.4–15.2)

## 2022-03-30 MED ORDER — ACETAMINOPHEN 325 MG PO TABS
650.0000 mg | ORAL_TABLET | Freq: Four times a day (QID) | ORAL | Status: AC
Start: 2022-03-30 — End: ?

## 2022-03-30 MED ORDER — WARFARIN SODIUM 5 MG PO TABS: ORAL_TABLET | ORAL | Status: AC

## 2022-03-30 MED ORDER — WARFARIN SODIUM 7.5 MG PO TABS: 7.5000 mg | ORAL_TABLET | Freq: Once | ORAL | Status: DC

## 2022-03-30 MED ORDER — SENNOSIDES-DOCUSATE SODIUM 8.6-50 MG PO TABS
2.0000 | ORAL_TABLET | Freq: Every day | ORAL | 0 refills | Status: AC
Start: 2022-03-30 — End: 2022-04-29

## 2022-03-30 MED ORDER — WARFARIN - PHARMACIST DOSING INPATIENT: Freq: Every day | Status: DC

## 2022-03-30 MED ORDER — POTASSIUM CHLORIDE CRYS ER 20 MEQ PO TBCR
40.0000 meq | EXTENDED_RELEASE_TABLET | Freq: Once | ORAL | Status: AC
  Administered 2022-03-30: 40 meq via ORAL
  Filled 2022-03-30: qty 2

## 2022-03-30 NOTE — TOC Transition Note (Signed)
Transition of Care [facility]) - CM/SW Discharge Note   Patient Details  Name: Shelby Morgan MRN: [id number] Date of Birth: May 13, 1956  Transition of Care [facility]) CM/SW Contact:  Salome Arnt, LCSW Phone Number: 03/30/2022, 11:58 AM   Clinical Narrative:  Pt d/c today. PT recommending home health. Discussed with pt who states she is active with [facility] for PT/OT. LCSW notified [name] with [facility] of d/c. Pt reports she may have her mother stay with her for awhile to help as she is having a lot of pain from rib fracture. She also indicates her sister-in-law can help as needed. Home health orders in.      Final next level of care: Home w Home Health Services Barriers to Discharge: Barriers Resolved   Patient Goals and CMS Choice     Choice offered to / list presented to : Patient  Discharge Placement                    Patient and family notified of of transfer: 03/30/22  Discharge Plan and Services                          HH Arranged: PT, OT [facility] Agency: [place] Date [place]: 03/30/22 Time [place]: 4782 Representative spoke with at [place]: [place] Determinants of Health ([place]) Interventions     Readmission Risk Interventions     No data to display

## 2022-03-30 NOTE — Plan of Care (Signed)
  Problem: Acute Rehab OT Goals (only OT should resolve) Goal: Pt. Will Perform Grooming Flowsheets (Taken 03/30/2022 1007) Pt Will Perform Grooming:  with modified independence  standing Goal: Pt. Will Perform Upper Body Dressing Flowsheets (Taken 03/30/2022 1007) Pt Will Perform Upper Body Dressing:  with modified independence  sitting Goal: Pt. Will Perform Lower Body Dressing Flowsheets (Taken 03/30/2022 1007) Pt Will Perform Lower Body Dressing:  with modified independence  sitting/lateral leans Goal: Pt. Will Transfer To Toilet Flowsheets (Taken 03/30/2022 1007) Pt Will Transfer to Toilet:  with modified independence  ambulating Goal: Pt/Caregiver Will Perform Home Exercise Program Flowsheets (Taken 03/30/2022 1007) Pt/caregiver will Perform Home Exercise Program:  Increased ROM  Increased strength  Both right and left upper extremity  Independently  Parker Sawatzky OT, MOT

## 2022-03-30 NOTE — Care Management Obs Status (Signed)
[place] NOTIFICATION   Patient Details  Name: Shelby Morgan MRN: [id number] Date of Birth: [birth year]   Medicare Observation Status Notification Given:  Yes (reviewed letter by phone,mailed to address on file)    Tommy Medal 03/30/2022, 11:41 AM

## 2022-03-30 NOTE — Evaluation (Signed)
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     She reportedly had a fall at home living independently while using walker coming from the bathroom.  She complains of pain to the right chest rib cage and right shoulder blade.  She reports pleuritic chest pain.  She reported that she heard a pop when she fell and landed on the floor.  She was worked up in the ED and noted to have a single mildly displaced right fifth rib fracture.  She was treated with pain medication and a lidocaine patch.  She was started on incentive spirometer.  The ED initially attempted to send her home however she started crying and complaining of uncontrolled pain and expressed concern for discharge.  While she was crying her oxygen saturation dropped into the 70s and she was placed on 2 L supplemental oxygen nasal cannula.  Patient reports that she lives alone and cannot manage at home and reports pain is uncontrolled.  Admission was requested for further management. (per MD)    Clinical Impression  Patient lying in bed with HOB elevated on therapists arrival.  Agreeable to physical therapy evaluation.  Patient unable to tolerate bed being in the fully lowered position due to right side rib pain.  Needs min A and HOB elevated for supine to sit to bring her trunk upright. Once sitting she demonstrates fair to good sitting balance.  Pain limits her tolerance for  movement.  Patient performs sit to stand with min A to RW and able to walk in the hallway x 100 ft with RW and CGA for safety; no path devation or LOB noted.  Patient returns to sitting in chair with legs elevated . Patient will benefit from continued skilled therapy interventions to address deficits and promote return to optimal function.      Recommendations for follow up therapy are one component of a multi-disciplinary discharge planning process, led by the attending physician.  Recommendations may be updated based on patient status, additional functional criteria and insurance authorization.  Follow Up Recommendations Home health PT      Assistance Recommended at Discharge Intermittent Supervision/Assistance  Patient can return home with the following  A little help with walking and/or transfers;A little help with bathing/dressing/bathroom;Help with stairs or ramp for entrance    Equipment Recommendations None recommended by PT  Recommendations for Other Services       Functional Status Assessment Patient has had a recent decline in their functional status and demonstrates the ability to make significant improvements in function in a reasonable and predictable amount of time.     Precautions / Restrictions Precautions Precautions: Fall Restrictions Weight Bearing Restrictions: No      Mobility  Bed Mobility Overal bed mobility: Needs Assistance Bed Mobility: Sit to Supine       Sit to supine: Mod assist, HOB elevated, Min assist   General bed mobility comments: Pt reporting trouble breathing and increase in pain needing assist to come to EOB from supine.  Patient Response: Cooperative  Transfers Overall transfer level: Needs assistance Equipment used: Rolling walker (2 wheels) Transfers: Sit to/from Stand, Bed to chair/wheelchair/BSC Sit to Stand: Min guard   Step pivot transfers: Supervision, Min guard       General transfer comment: Labored movement with  increased pain at times. Min G for safety.    Ambulation/Gait Ambulation/Gait assistance: Min guard Gait Distance (Feet): 100 Feet Assistive device: Rolling walker (2 wheels)         General Gait Details: decreased gait speed; needs cues to look up  Stairs            Wheelchair Mobility    Modified Rankin (Stroke Patients Only)       Balance Overall balance assessment: Needs assistance Sitting-balance support: Feet supported, Bilateral upper extremity supported Sitting balance-Leahy Scale: Good Sitting balance - Comments: seated EOB   Standing balance support: Bilateral upper extremity supported, During functional activity, Reliant on assistive device for balance Standing balance-Leahy Scale: Fair Standing balance comment: using RW                             Pertinent Vitals/Pain Pain Assessment Pain Assessment: Faces Pain Score: 8  Faces Pain Scale: Hurts whole lot Pain Location: R ribs Pain Descriptors / Indicators: Grimacing, Guarding Pain Intervention(s): Limited activity within patient's tolerance, Monitored during session, Repositioned    Home Living Family/patient expects to be discharged to:: Private residence Living Arrangements: Alone Available Help at Discharge: Family;Available 24 hours/day Type of Home: House Home Access: Stairs to enter Entrance Stairs-Rails: Right;Left;Can reach both Entrance Stairs-Number of Steps: 7   Home Layout: One level Home Equipment: Tub bench;Rolling Walker (2 wheels);Rollator (4 wheels);Grab bars - tub/shower Additional Comments: Pt has been living alone but reports that she can go live with her mother for 24/7 assist.    Prior Function Prior Level of Function : Needs assist             Mobility Comments: Walking with RW since CVA ADLs Comments: Indpendent ADL and IADL recently.     Hand Dominance   Dominant Hand: Right    Extremity/Trunk Assessment   Upper Extremity Assessment Upper  Extremity Assessment: RUE deficits/detail;LUE deficits/detail RUE Deficits / Details: Generally weak. Limited with pain in R flank area. LUE Deficits / Details: 3-/5 shoudler flexion; generally weak otherwise post recent stroke. pt reports L UE being weak since stroke. Dexreased dexterity per report. LUE Coordination: decreased fine motor    Lower Extremity Assessment Lower Extremity Assessment: Generalized weakness;LLE deficits/detail LLE Deficits / Details: numbness; weakness left lower extremity since stroke    Cervical / Trunk Assessment Cervical / Trunk Assessment: Normal  Communication   Communication: No difficulties  Cognition Arousal/Alertness: Awake/alert Behavior During Therapy: WFL for tasks assessed/performed Overall Cognitive Status: Within Functional Limits for tasks assessed                                          General Comments      Exercises     Assessment/Plan    PT Assessment Patient needs continued PT services  PT Problem List Decreased strength;Decreased range of motion;Decreased activity tolerance;Decreased balance;Pain;Decreased mobility;Impaired sensation;Decreased coordination       PT Treatment Interventions Balance training;Gait training;Neuromuscular re-education;Functional mobility training;Patient/family education;Therapeutic activities;Therapeutic exercise    PT Goals (Current goals can be found in  the Care Plan section)  Acute Rehab PT Goals Patient Stated Goal: return home PT Goal Formulation: With patient Time For Goal Achievement: 04/13/22 Potential to Achieve Goals: Good    Frequency Min 2X/week     Co-evaluation PT/OT/SLP Co-Evaluation/Treatment: Yes Reason for Co-Treatment: Complexity of the patient's impairments (multi-system involvement);To address functional/ADL transfers   OT goals addressed during session: ADL's and self-care       AM-PAC PT "6 Clicks" Mobility  Outcome Measure Help needed turning  from your back to your side while in a flat bed without using bedrails?: A Little Help needed moving from lying on your back to sitting on the side of a flat bed without using bedrails?: A Little Help needed moving to and from a bed to a chair (including a wheelchair)?: A Little Help needed standing up from a chair using your arms (e.g., wheelchair or bedside chair)?: A Little Help needed to walk in hospital room?: A Little Help needed climbing 3-5 steps with a railing? : A Lot 6 Click Score: 17    End of Session   Activity Tolerance: Patient tolerated treatment well;Patient limited by pain Patient left: in chair;with call bell/phone within reach   PT Visit Diagnosis: Other abnormalities of gait and mobility (R26.89);History of falling (Z91.81);Muscle weakness (generalized) (M62.81)    Time: 5945-8592 PT Time Calculation (min) (ACUTE ONLY): 26 min   Charges:   PT Evaluation $PT Eval Low Complexity: 1 Low PT Treatments $Therapeutic Activity: 8-22 mins        10:52 AM, 03/30/22 [place] Kamel Small Avrey Hyser MPT [place] [facility] [place] [phone] [phone]

## 2022-03-30 NOTE — Progress Notes (Signed)
ANTICOAGULATION CONSULT NOTE - Initial Consult  Pharmacy Consult for warfarin Indication:  hx VTE  Allergies  Allergen Reactions   Amlodipine Swelling   Codeine Nausea And Vomiting    Patient Measurements: Height: '5\' 2"'$  (157.5 cm) Weight: 100 kg (220 lb 7.4 oz) IBW/kg (Calculated) : 50.1   Vital Signs: Temp: 98.8 F (37.1 C) (09/04 0341) BP: 121/65 (09/04 0341) Pulse Rate: 78 (09/04 0341)  Labs: Recent Labs    03/29/22 0652 03/30/22 0437  HGB 11.3* 10.5*  HCT 35.5* 32.8*  PLT 312 276  LABPROT 16.3*  --   INR 1.3*  --   CREATININE 1.07* 0.92    Estimated Creatinine Clearance: 67.5 mL/min (by C-G formula based on SCr of 0.92 mg/dL).   Medical History: Past Medical History:  Diagnosis Date   Anxiety    Bell's palsy 01/08/2011   Chronic back pain    Depression    Diabetes mellitus    Diverticulosis    GERD (gastroesophageal reflux disease)    Hemorrhoids    Hepatomegaly    Hypertension    Lumbar stenosis    L2/L3--followed by Dr. Ernestina Patches   Melanoma [facility]) 2009   Dr [place] Crane, Stage 2, required only surgery   NAFLD (nonalcoholic fatty liver disease)    Right thalamic stroke ([place]) 01/14/2022   Skin cancer    Stroke ([place]) 07/2021    Medications:  Medications Prior to Admission  Medication Sig Dispense Refill Last Dose   acetaminophen (TYLENOL) 325 MG tablet Take 650 mg by mouth every 6 (six) hours as needed for moderate pain or mild pain.   03/28/2022   busPIRone (BUSPAR) 5 MG tablet TAKE 1 TABLET BY MOUTH TWICE A DAY 60 tablet 0 03/28/2022   COLLAGEN PO Take 1 capsule by mouth daily.   03/28/2022   diclofenac Sodium (VOLTAREN) 1 % GEL Apply 4 g topically 4 (four) times daily. 350 g 0 Past Week   furosemide (LASIX) 20 MG tablet Take 1 tablet (20 mg total) by mouth every morning. 30 tablet 0 03/28/2022   gabapentin (NEURONTIN) 100 MG capsule Take 1 capsule (100 mg total) by mouth 3 (three) times daily. 90 capsule 0 03/28/2022   glimepiride (AMARYL) 2 MG tablet Take 2 mg  by mouth daily.   03/28/2022   hydrochlorothiazide (HYDRODIURIL) 25 MG tablet Take 25 mg by mouth 2 (two) times daily.   03/28/2022   Magnesium 400 MG CAPS Take 400 mg by mouth daily.   03/28/2022   Multiple Vitamins-Minerals (CENTRUM SILVER 50+WOMEN) TABS Take 1 tablet by mouth daily.   03/28/2022   pantoprazole (PROTONIX) 40 MG tablet Take 1 tablet (40 mg total) by mouth daily. 30 tablet 0 03/28/2022   polyvinyl alcohol (LIQUIFILM TEARS) 1.4 % ophthalmic solution Place 1 drop into the left eye as needed for dry eyes. 15 mL 0 03/28/2022   rosuvastatin (CRESTOR) 20 MG tablet Take 1 tablet (20 mg total) by mouth daily. 30 tablet 1 03/28/2022   TRADJENTA 5 MG TABS tablet TAKE 1 TABLET (5 MG TOTAL) BY MOUTH DAILY. 30 tablet 0 03/28/2022   traZODone (DESYREL) 50 MG tablet Take 1 tablet (50 mg total) by mouth at bedtime as needed for sleep. 15 tablet 0 unk   warfarin (COUMADIN) 5 MG tablet Take 5 mg by mouth daily.   03/28/2022 at 1100   amLODipine (NORVASC) 5 MG tablet Take 1 tablet (5 mg total) by mouth daily. (Patient not taking: Reported on 03/29/2022) 30 tablet 3 Not Taking  apixaban (ELIQUIS) 5 MG TABS tablet Take 1 tablet (5 mg total) by mouth 2 (two) times daily. (Patient not taking: Reported on 03/29/2022) 60 tablet 0 Not Taking    Assessment: Pharmacy consulted to dose warfarin in patient with history of DVT/acquired thrombophilia.  Home dose listed as 5 mg daily with last dose taken 9/2 1100.  INR on admission is 1.3.   Goal of Therapy:  INR 2-3 Monitor platelets by anticoagulation protocol: Yes   Plan:  Warfarin 7.5 mg x 1 dose- booster dose since subtherapeutic  Monitor daily INR and s/s of bleeding.  Margot Ables, PharmD Clinical Pharmacist 03/30/2022 9:04 AM

## 2022-03-30 NOTE — Progress Notes (Signed)
Patient stable and ready for discharge home. Patient's IV removed by NT. Writer went over discharge paperwork with patient and verbalized understanding. Patient got dressed with assistance by NT.  NT transported patient via [place] to her private car family picked up patient to take her home. Patient packed her own belongings.

## 2022-03-30 NOTE — Evaluation (Signed)
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     She reportedly had a fall at home living independently while using walker coming from the bathroom.  She complains of pain to the right chest rib cage and right shoulder blade.  She reports pleuritic chest pain.  She reported that she heard a pop when she fell and landed on the floor.  She was worked up in the ED and noted to have a single mildly displaced right fifth rib fracture.  She was treated with pain medication and a lidocaine patch.  She was started on incentive spirometer.  The ED initially attempted to send her home however she started crying and complaining of uncontrolled pain and expressed concern for discharge.  While she was crying her oxygen saturation dropped into the 70s and she was placed on 2 L supplemental oxygen nasal cannula.  Patient reports that she lives alone and cannot manage at home and reports pain is uncontrolled.  Admission was requested for further management. (per MD)   Clinical Impression   Pt agreeable to OT and PT co-evaluation. Pt presents with R flank pain which limited bed mobility with pt needing min to mod A with HOB elevated. Pt struggled to bring when attempting supine position. Pt lives alone but reports she can live with her mother for 24/7 support. Pt requires min G assist for standing and ambulation in hall and toilet transfer. L UE is  weak with deficits in dexterity at baseline from previous stroke. R UE strength and ROM limited due to R flank pain. Pt was left in chair with chair alarm set. Pt will benefit from continued OT in the hospital and recommended venue below to increase strength, balance, and endurance for safe ADL's.         Recommendations for follow up therapy are one component of a multi-disciplinary discharge planning process, led by the attending physician.  Recommendations may be updated based on patient status, additional functional criteria and insurance authorization.   Follow Up Recommendations  Home health OT    Assistance Recommended at Discharge Intermittent Supervision/Assistance  Patient can return home with the following A little help with walking and/or transfers;A little help with bathing/dressing/bathroom;Assist for transportation;Help with stairs or ramp for entrance    Functional Status Assessment  Patient has had a recent decline in their functional status and demonstrates the ability to make significant improvements in function in a reasonable and predictable amount of time.  Equipment Recommendations  None recommended by OT    Recommendations for Other Services       Precautions / Restrictions Precautions Precautions: Fall Restrictions Weight Bearing Restrictions: No (Simultaneous filing. User may not have seen previous data.)      Mobility Bed Mobility Overal bed mobility: Needs Assistance Bed Mobility: Sit to Supine       Sit to supine: Mod assist, HOB elevated, Min assist   General bed mobility comments: Pt reporting trouble breathing and increase in pain needing assist to come  to EOB from supine.    Transfers Overall transfer level: Needs assistance Equipment used: Rolling walker (2 wheels) Transfers: Sit to/from Stand, Bed to chair/wheelchair/BSC Sit to Stand: Min guard     Step pivot transfers: Supervision, Min guard     General transfer comment: Labored  movement with increased pain at times. Min G for safety.      Balance Overall balance assessment: Needs assistance Sitting-balance support: Feet supported, Bilateral upper extremity supported Sitting balance-Leahy Scale: Good Sitting balance - Comments: seated EOB   Standing balance support: Bilateral upper extremity supported, During functional activity, Reliant on assistive device for balance Standing balance-Leahy Scale: Fair Standing balance comment: using RW                           ADL either performed or assessed with clinical judgement   ADL Overall ADL's : Needs assistance/impaired     Grooming: Supervision/safety;Standing   Upper Body Bathing: Minimal assistance;Min guard;Sitting   Lower Body Bathing: Moderate assistance;Sitting/lateral leans   Upper Body Dressing : Min guard;Minimal assistance;Sitting   Lower Body Dressing: Moderate assistance;Sitting/lateral leans Lower Body Dressing Details (indicate cue type and reason): Pt able to don R sock but requried assist with L sock while seated at EOB. Toilet Transfer: Min guard;Ambulation;Rolling walker (2 wheels) Toilet Transfer Details (indicate cue type and reason): Pt able to ambulate to toilet and to chair from toilet with RW using grab bar. Pt moaning but able to complete without physical assist. Toileting- Clothing Manipulation and Hygiene: Sitting/lateral lean;Min guard       Functional mobility during ADLs: Min guard;Supervision/safety;Rolling walker (2 wheels) General ADL Comments: In pain during transfers and lifting of R UE.     Vision Baseline Vision/History: 1 Wears glasses Ability to See in Adequate Light: 0 Adequate Patient Visual Report: No change from baseline Vision Assessment?: No apparent visual deficits                Pertinent Vitals/Pain Pain Assessment Pain Assessment: Faces Faces Pain Scale: Hurts whole lot Pain Location: R ribs Pain Descriptors / Indicators:  Grimacing, Guarding Pain Intervention(s): Limited activity within patient's tolerance, Monitored during session, Repositioned     Hand Dominance Right   Extremity/Trunk Assessment Upper Extremity Assessment Upper Extremity Assessment: RUE deficits/detail;LUE deficits/detail RUE Deficits / Details: Generally weak. Limited with pain in R flank area. LUE Deficits / Details: 3-/5 shoudler flexion; generally weak otherwise post recent stroke. pt reports L UE being weak since stroke. Dexreased dexterity per report. LUE Coordination: decreased fine motor   Lower Extremity Assessment Lower Extremity Assessment: Defer to PT evaluation   Cervical / Trunk Assessment Cervical / Trunk Assessment: Normal   Communication Communication Communication: No difficulties   Cognition Arousal/Alertness: Awake/alert Behavior During Therapy: WFL for tasks assessed/performed Overall Cognitive Status: Within Functional Limits for tasks assessed                                                  Home Living Family/patient expects to be discharged to:: Private residence Living Arrangements: Alone Available Help at Discharge: Family;Available 24 hours/day Type of Home: House Home Access: Stairs to enter [employer] of Steps: 7 Entrance Stairs-Rails: Right;Left;Can reach both Home Layout: One level     Bathroom Shower/Tub: Teacher, early years/pre: Standard Bathroom Accessibility: Yes  How Accessible: Accessible via walker Home Equipment: Tub bench;Rolling Walker (2 wheels);Rollator (4 wheels);Grab bars - tub/shower   Additional Comments: Pt has been living alone but reports that she can go live with her mother for 24/7 assist.      Prior Functioning/Environment Prior Level of Function : Needs assist             Mobility Comments: Walking with RW since CVA ADLs Comments: Indpendent ADL and IADL recently.        OT Problem List: Decreased  strength;Decreased range of motion;Decreased activity tolerance;Impaired balance (sitting and/or standing);Pain      OT Treatment/Interventions: Self-care/ADL training;Therapeutic exercise;Therapeutic activities;Patient/family education;Balance training;Neuromuscular education    OT Goals(Current goals can be found in the care plan section) Acute Rehab OT Goals Patient Stated Goal: return home OT Goal Formulation: With patient Time For Goal Achievement: 04/13/22 Potential to Achieve Goals: Good  OT Frequency: Min 1X/week    Co-evaluation PT/OT/SLP Co-Evaluation/Treatment: Yes Reason for Co-Treatment: To address functional/ADL transfers   OT goals addressed during session: ADL's and self-care      AM-PAC OT "6 Clicks" Daily Activity     Outcome Measure Help from another person eating meals?: None Help from another person taking care of personal grooming?: A Little Help from another person toileting, which includes using toliet, bedpan, or urinal?: None Help from another person bathing (including washing, rinsing, drying)?: A Little Help from another person to put on and taking off regular upper body clothing?: A Little Help from another person to put on and taking off regular lower body clothing?: A Little 6 Click Score: 20   End of Session Equipment Utilized During Treatment: Rolling walker (2 wheels)  Activity Tolerance: Patient tolerated treatment well Patient left: in chair;with call bell/phone within reach;with chair alarm set  OT Visit Diagnosis: Unsteadiness on feet (R26.81);Other abnormalities of gait and mobility (R26.89);Muscle weakness (generalized) (M62.81);Pain Pain - Right/Left: Right Pain - part of body:  (flank)                Time: 0211-1735 OT Time Calculation (min): 27 min Charges:  OT General Charges $OT Visit: 1 Visit OT Evaluation $OT Eval Low Complexity: 1 Low  Mckinley Adelstein OT, MOT  Larey Seat 03/30/2022, 10:04 AM

## 2022-03-30 NOTE — Discharge Summary (Addendum)
Physician Discharge Summary  MEGYN LENG YFV:[id number] DOB: Nov 14, 1955 DOA: 03/29/2022  PCP: Redmond School, MD  Admit date: 03/29/2022 Discharge date: 03/30/2022  Admitted From:  Home  Disposition: Home   Recommendations for Outpatient Follow-up:  Follow up with PCP in 1 weeks Please obtain follow up CXR in 2 weeks  Please arrange for outpatient PET scan to follow up mediastinal adenopathy Ambulatory referral to oncology made for eval of mediastinal adenopathy  Home Health: PT, OT  Discharge Condition: STABLE   CODE STATUS: FULL  DIET: resume carb modified heart healthy   Brief Hospitalization Summary: Please see all hospital notes, images, labs for full details of the hospitalization. 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  She reportedly had a fall at home living independently while using walker coming from the bathroom.  She complains of pain to the right chest rib cage and right shoulder blade.  She reports pleuritic chest pain.  She reported that she heard a pop when she fell and landed on the floor.  She was worked up in the ED and noted to have a single mildly displaced right fifth rib fracture.  She was treated with pain medication and a lidocaine patch.  She was started on incentive spirometer.  The ED initially attempted to send her home however she started crying and complaining of uncontrolled pain and expressed concern for discharge.  While she was crying her oxygen saturation dropped into the 70s and she was placed on 2 L supplemental oxygen nasal cannula.  Patient  reports that she lives alone and cannot manage at home and reports pain is uncontrolled.  Admission was requested for further management.  Hospital Course Patient was admitted into the hospital for observation because she was having significant pain after sustaining a fall and having a single right rib fracture and also noted to have a new oxygen requirement likely from shallow breathing secondary to severe pleuritic chest pain.  She was given pain management and supplemental oxygen which has been weaned to room air now.  She has been started on lidocaine patches, oral pain medication and seems to be tolerating well.  She is breathing better.  She is using's incentive spirometry.  She was evaluated by PT and OT and recommendation was made for home health PT/OT.  Patient stable to discharge home. Pt incidentally noted to have enlarging mediastinal adenopathy and radiology recommended a PET scan.  We will refer to oncology to arrange for outpatient PET scan to follow up.    Discharge Diagnoses:  Principal Problem:   Acute respiratory failure with hypoxia (HCC) Active Problems:   Uncontrolled pain   Anxiety   GERD (gastroesophageal reflux disease)   NAFLD (nonalcoholic fatty liver disease)   Obesity   Diabetes mellitus type 2 in obese (HCC)   Hypokalemia   Mediastinal adenopathy   History of CVA (cerebrovascular accident)   Generalized anxiety disorder   Leukocytosis   Physical deconditioning   Rib fracture   Acquired thrombophilia [facility])   Discharge Instructions: Discharge Instructions     Ambulatory referral to Hematology / Oncology   Complete by: As directed  Allergies as of 03/30/2022       Reactions   Amlodipine Swelling   Codeine Nausea And Vomiting        Medication List     STOP taking these medications    amLODipine 5 MG tablet Commonly known as: NORVASC   apixaban 5 MG Tabs tablet Commonly known as: ELIQUIS   furosemide 20 MG tablet Commonly known as:  LASIX       TAKE these medications    acetaminophen 325 MG tablet Commonly known as: TYLENOL Take 2 tablets (650 mg total) by mouth every 6 (six) hours. What changed:  when to take this reasons to take this   busPIRone 5 MG tablet Commonly known as: BUSPAR TAKE 1 TABLET BY MOUTH TWICE A DAY   Centrum Silver 50+Women Tabs Take 1 tablet by mouth daily.   COLLAGEN PO Take 1 capsule by mouth daily.   diclofenac Sodium 1 % Gel Commonly known as: VOLTAREN Apply 4 g topically 4 (four) times daily.   gabapentin 100 MG capsule Commonly known as: NEURONTIN Take 1 capsule (100 mg total) by mouth 3 (three) times daily.   glimepiride 2 MG tablet Commonly known as: AMARYL Take 2 mg by mouth daily.   hydrochlorothiazide 25 MG tablet Commonly known as: HYDRODIURIL Take 25 mg by mouth 2 (two) times daily.   lidocaine 4 % Place 1 patch onto the skin daily.   Magnesium 400 MG Caps Take 400 mg by mouth daily.   methocarbamol 500 MG tablet Commonly known as: ROBAXIN Take 1 tablet (500 mg total) by mouth every 8 (eight) hours as needed for muscle spasms.   oxyCODONE-acetaminophen 5-325 MG tablet Commonly known as: PERCOCET/ROXICET Take 1 tablet by mouth every 6 (six) hours as needed for severe pain.   pantoprazole 40 MG tablet Commonly known as: PROTONIX Take 1 tablet (40 mg total) by mouth daily.   polyvinyl alcohol 1.4 % ophthalmic solution Commonly known as: LIQUIFILM TEARS Place 1 drop into the left eye as needed for dry eyes.   rosuvastatin 20 MG tablet Commonly known as: CRESTOR Take 1 tablet (20 mg total) by mouth daily.   senna-docusate 8.6-50 MG tablet Commonly known as: Senokot-S Take 2 tablets by mouth at bedtime.   Tradjenta 5 MG Tabs tablet Generic drug: linagliptin TAKE 1 TABLET (5 MG TOTAL) BY MOUTH DAILY.   traZODone 50 MG tablet Commonly known as: DESYREL Take 1 tablet (50 mg total) by mouth at bedtime as needed for sleep.   warfarin 5 MG  tablet Commonly known as: COUMADIN Take 1.5 tabs x 1 dose tonight (9/4) then resume taking 1 tab daily What changed:  how much to take how to take this when to take this additional instructions        Follow-up Information     Redmond School, MD. Schedule an appointment as soon as possible for a visit in 1 week(s).   Specialty: Internal Medicine Why: Hospital Follow Up Contact information: [address] [place] [address] [phone]                Allergies  Allergen Reactions   Amlodipine Swelling   Codeine Nausea And Vomiting   Allergies as of 03/30/2022       Reactions   Amlodipine Swelling   Codeine Nausea And Vomiting        Medication List     STOP taking these medications    amLODipine 5 MG tablet Commonly known as: NORVASC   apixaban  5 MG Tabs tablet Commonly known as: ELIQUIS   furosemide 20 MG tablet Commonly known as: LASIX       TAKE these medications    acetaminophen 325 MG tablet Commonly known as: TYLENOL Take 2 tablets (650 mg total) by mouth every 6 (six) hours. What changed:  when to take this reasons to take this   busPIRone 5 MG tablet Commonly known as: BUSPAR TAKE 1 TABLET BY MOUTH TWICE A DAY   Centrum Silver 50+Women Tabs Take 1 tablet by mouth daily.   COLLAGEN PO Take 1 capsule by mouth daily.   diclofenac Sodium 1 % Gel Commonly known as: VOLTAREN Apply 4 g topically 4 (four) times daily.   gabapentin 100 MG capsule Commonly known as: NEURONTIN Take 1 capsule (100 mg total) by mouth 3 (three) times daily.   glimepiride 2 MG tablet Commonly known as: AMARYL Take 2 mg by mouth daily.   hydrochlorothiazide 25 MG tablet Commonly known as: HYDRODIURIL Take 25 mg by mouth 2 (two) times daily.   lidocaine 4 % Place 1 patch onto the skin daily.   Magnesium 400 MG Caps Take 400 mg by mouth daily.   methocarbamol 500 MG tablet Commonly known as: ROBAXIN Take 1 tablet (500 mg total) by  mouth every 8 (eight) hours as needed for muscle spasms.   oxyCODONE-acetaminophen 5-325 MG tablet Commonly known as: PERCOCET/ROXICET Take 1 tablet by mouth every 6 (six) hours as needed for severe pain.   pantoprazole 40 MG tablet Commonly known as: PROTONIX Take 1 tablet (40 mg total) by mouth daily.   polyvinyl alcohol 1.4 % ophthalmic solution Commonly known as: LIQUIFILM TEARS Place 1 drop into the left eye as needed for dry eyes.   rosuvastatin 20 MG tablet Commonly known as: CRESTOR Take 1 tablet (20 mg total) by mouth daily.   senna-docusate 8.6-50 MG tablet Commonly known as: Senokot-S Take 2 tablets by mouth at bedtime.   Tradjenta 5 MG Tabs tablet Generic drug: linagliptin TAKE 1 TABLET (5 MG TOTAL) BY MOUTH DAILY.   traZODone 50 MG tablet Commonly known as: DESYREL Take 1 tablet (50 mg total) by mouth at bedtime as needed for sleep.   warfarin 5 MG tablet Commonly known as: COUMADIN Take 1.5 tabs x 1 dose tonight (9/4) then resume taking 1 tab daily What changed:  how much to take how to take this when to take this additional instructions        Procedures/Studies: CT ABDOMEN PELVIS W CONTRAST  Result Date: 03/29/2022 CLINICAL DATA:  Blunt trauma after fall. EXAM: CT CHEST, ABDOMEN, AND PELVIS WITH CONTRAST TECHNIQUE: Multidetector CT imaging of the chest, abdomen and pelvis was performed following the standard protocol during bolus administration of intravenous contrast. RADIATION DOSE REDUCTION: This exam was performed according to the departmental dose-optimization program which includes automated exposure control, adjustment of the mA and/or kV according to patient size and/or use of iterative reconstruction technique. CONTRAST:  126m OMNIPAQUE IOHEXOL 300 MG/ML  SOLN COMPARISON:  February 08, 2022.  August 05, 2018. FINDINGS: CT CHEST FINDINGS Cardiovascular: No significant vascular findings. Normal heart size. No pericardial effusion. Mediastinum/Nodes:  Esophagus is unremarkable. 8 mm right thyroid nodule is noted. There is again noted enlarged mediastinal adenopathy. 1.9 cm aorta pulmonary window adenopathy is noted which is slightly enlarged compared to prior exam where it measured 1.7 cm. 2 cm subcarinal adenopathy is noted which is slightly enlarged compared to prior exam where it measured 1.2 cm. 1 cm right  paratracheal lymph node is noted which is slightly enlarged compared to prior exam where it measured 5 mm. 11 mm right hilar lymph node is noted which is enlarged. Grossly stable 1 cm left hilar lymph node is noted. Lungs/Pleura: No pneumothorax or pleural effusion is noted. Mild bibasilar subsegmental atelectasis is noted. Musculoskeletal: Interval development of mildly displaced fracture involving the posterolateral portion of the right fifth rib. CT ABDOMEN PELVIS FINDINGS Hepatobiliary: No focal liver abnormality is seen. No gallstones, gallbladder wall thickening, or biliary dilatation. Pancreas: Unremarkable. No pancreatic ductal dilatation or surrounding inflammatory changes. Spleen: Normal in size without focal abnormality. Adrenals/Urinary Tract: Adrenal glands are unremarkable. Kidneys are normal, without renal calculi, focal lesion, or hydronephrosis. Bladder is unremarkable. Stomach/Bowel: Stomach is within normal limits. Appendix appears normal. No evidence of bowel wall thickening, distention, or inflammatory changes. Vascular/Lymphatic: Aortic atherosclerosis. No enlarged abdominal or pelvic lymph nodes. Reproductive: Uterus and bilateral adnexa are unremarkable. Other: No abdominal wall hernia or abnormality. No abdominopelvic ascites. Musculoskeletal: Multilevel degenerative disc disease is noted in the lumbar spine. No acute abnormality is noted. IMPRESSION: Mildly displaced right fifth rib fracture. Mildly increased mediastinal adenopathy is noted compared to prior exam. This is concerning for possible malignancy or metastatic disease.  PET scan may be considered for further evaluation. 8 mm right thyroid nodule. Not clinically significant; no follow-up imaging recommended. (Ref: J Am Coll Radiol. 2015 Feb;12(2): 143-50). No acute abnormality seen in the abdomen or pelvis. Aortic Atherosclerosis (ICD10-I70.0). Electronically Signed   By: Marijo Conception M.D.   On: 03/29/2022 09:24   CT CHEST W CONTRAST  Result Date: 03/29/2022 CLINICAL DATA:  Blunt trauma after fall. EXAM: CT CHEST, ABDOMEN, AND PELVIS WITH CONTRAST TECHNIQUE: Multidetector CT imaging of the chest, abdomen and pelvis was performed following the standard protocol during bolus administration of intravenous contrast. RADIATION DOSE REDUCTION: This exam was performed according to the departmental dose-optimization program which includes automated exposure control, adjustment of the mA and/or kV according to patient size and/or use of iterative reconstruction technique. CONTRAST:  185m OMNIPAQUE IOHEXOL 300 MG/ML  SOLN COMPARISON:  February 08, 2022.  August 05, 2018. FINDINGS: CT CHEST FINDINGS Cardiovascular: No significant vascular findings. Normal heart size. No pericardial effusion. Mediastinum/Nodes: Esophagus is unremarkable. 8 mm right thyroid nodule is noted. There is again noted enlarged mediastinal adenopathy. 1.9 cm aorta pulmonary window adenopathy is noted which is slightly enlarged compared to prior exam where it measured 1.7 cm. 2 cm subcarinal adenopathy is noted which is slightly enlarged compared to prior exam where it measured 1.2 cm. 1 cm right paratracheal lymph node is noted which is slightly enlarged compared to prior exam where it measured 5 mm. 11 mm right hilar lymph node is noted which is enlarged. Grossly stable 1 cm left hilar lymph node is noted. Lungs/Pleura: No pneumothorax or pleural effusion is noted. Mild bibasilar subsegmental atelectasis is noted. Musculoskeletal: Interval development of mildly displaced fracture involving the posterolateral  portion of the right fifth rib. CT ABDOMEN PELVIS FINDINGS Hepatobiliary: No focal liver abnormality is seen. No gallstones, gallbladder wall thickening, or biliary dilatation. Pancreas: Unremarkable. No pancreatic ductal dilatation or surrounding inflammatory changes. Spleen: Normal in size without focal abnormality. Adrenals/Urinary Tract: Adrenal glands are unremarkable. Kidneys are normal, without renal calculi, focal lesion, or hydronephrosis. Bladder is unremarkable. Stomach/Bowel: Stomach is within normal limits. Appendix appears normal. No evidence of bowel wall thickening, distention, or inflammatory changes. Vascular/Lymphatic: Aortic atherosclerosis. No enlarged abdominal or pelvic lymph nodes. Reproductive: Uterus and  bilateral adnexa are unremarkable. Other: No abdominal wall hernia or abnormality. No abdominopelvic ascites. Musculoskeletal: Multilevel degenerative disc disease is noted in the lumbar spine. No acute abnormality is noted. IMPRESSION: Mildly displaced right fifth rib fracture. Mildly increased mediastinal adenopathy is noted compared to prior exam. This is concerning for possible malignancy or metastatic disease. PET scan may be considered for further evaluation. 8 mm right thyroid nodule. Not clinically significant; no follow-up imaging recommended. (Ref: J Am Coll Radiol. 2015 Feb;12(2): 143-50). No acute abnormality seen in the abdomen or pelvis. Aortic Atherosclerosis (ICD10-I70.0). Electronically Signed   By: Marijo Conception M.D.   On: 03/29/2022 09:24   CT HEAD WO CONTRAST (5MM)  Result Date: 03/29/2022 CLINICAL DATA:  66 year old female status post fall with pain. Right thalamic lacune in June. EXAM: CT HEAD WITHOUT CONTRAST TECHNIQUE: Contiguous axial images were obtained from the base of the skull through the vertex without intravenous contrast. RADIATION DOSE REDUCTION: This exam was performed according to the departmental dose-optimization program which includes automated  exposure control, adjustment of the mA and/or kV according to patient size and/or use of iterative reconstruction technique. COMPARISON:  Head CT 01/07/2022.  Brain MRI 01/06/2022. FINDINGS: Brain: Expected evolution of right thalamic lacunar infarct (series 2, image 15). No midline shift, ventriculomegaly, mass effect, evidence of mass lesion, intracranial hemorrhage or evidence of cortically based acute infarction. Stable gray-white matter differentiation outside of the right thalamus. Incidental right tentorial dural calcification. Vascular: Calcified atherosclerosis at the skull base. No suspicious intracranial vascular hyperdensity. Skull: Stable hyperostosis. No acute osseous abnormality identified. Sinuses/Orbits: Visualized paranasal sinuses and mastoids are stable and well aerated. Other: No acute orbit or scalp soft tissue injury identified. IMPRESSION: 1. No acute intracranial abnormality or acute traumatic injury identified. 2. Expected evolution of right thalamic infarct since June. Electronically Signed   By: Genevie Ann M.D.   On: 03/29/2022 07:06   DG Shoulder Right Portable  Result Date: 03/29/2022 CLINICAL DATA:  Fall.  Shoulder pain. EXAM: RIGHT SHOULDER - 1 VIEW COMPARISON:  None Available. FINDINGS: No evidence of an acute fracture. No evidence for shoulder separation or dislocation. Degenerative changes are noted in the glenohumeral and acromioclavicular joints. Calcific tendinitis noted in the rotator cuff. IMPRESSION: 1. No acute bony abnormality. 2. Degenerative changes in the glenohumeral and acromioclavicular joints. Electronically Signed   By: Misty Stanley M.D.   On: 03/29/2022 06:21   DG Chest Port 1 View  Result Date: 03/29/2022 CLINICAL DATA:  Fall. EXAM: PORTABLE CHEST 1 VIEW COMPARISON:  02/08/2022 FINDINGS: 0606 hours. The lungs are clear without focal pneumonia, edema, pneumothorax or pleural effusion. There is pulmonary vascular congestion without overt pulmonary edema.  Cardiopericardial silhouette is at upper limits of normal for size. Minimally displaced fracture posterolateral right fifth rib may be acute. Cortical off step on the posterolateral right sixth rib also suggest fracture. IMPRESSION: 1. Fractures of the posterolateral right fifth and sixth ribs are likely acute and are new since 02/08/2022. 2. No pneumothorax or pleural effusion. Electronically Signed   By: Misty Stanley M.D.   On: 03/29/2022 06:20     Subjective: Pt off oxygen now, breathing better, still with significant rib cage pain but pain is better controlled now.   Discharge Exam: Vitals:   03/30/22 0130 03/30/22 0341  BP: 122/68 121/65  Pulse:  78  Resp:  20  Temp:  98.8 F (37.1 C)  SpO2:  93%   Vitals:   03/29/22 1838 03/29/22 2324 03/30/22 0130 03/30/22  0341  BP: 124/64 98/75 122/68 121/65  Pulse: 81 73  78  Resp: '20 18  20  '$ Temp: 98.2 F (36.8 C) 98.5 F (36.9 C)  98.8 F (37.1 C)  TempSrc: Oral     SpO2: 96% 97%  93%  Weight:      Height:       General: Pt is alert, awake, not in acute distress Cardiovascular: RRR, S1/S2 +, no rubs, no gallops Respiratory: CTA bilaterally, bruised right rib, no wheezing, no rhonchi Abdominal: Soft, NT, ND, bowel sounds + Extremities: no edema, no cyanosis   The results of significant diagnostics from this hospitalization (including imaging, microbiology, ancillary and laboratory) are listed below for reference.     Microbiology: No results found for this or any previous visit (from the past 240 hour(s)).   Labs: BNP (last 3 results) Recent Labs    02/08/22 1114  BNP 6.6   Basic Metabolic Panel: Recent Labs  Lab 03/29/22 0652 03/30/22 0437  NA 136 135  K 3.3* 3.3*  CL 99 98  CO2 26 28  GLUCOSE 233* 140*  BUN 26* 17  CREATININE 1.07* 0.92  CALCIUM 9.3 8.9  MG 1.7 1.8   Liver Function Tests: Recent Labs  Lab 03/29/22 0652  AST 35  ALT 28  ALKPHOS 53  BILITOT 0.7  PROT 7.8  ALBUMIN 4.0   No results  for input(s): "LIPASE", "AMYLASE" in the last 168 hours. No results for input(s): "AMMONIA" in the last 168 hours. CBC: Recent Labs  Lab 03/29/22 0652 03/30/22 0437  WBC 13.6* 8.4  NEUTROABS  --  5.4  HGB 11.3* 10.5*  HCT 35.5* 32.8*  MCV 86.4 86.1  PLT 312 276   Cardiac Enzymes: No results for input(s): "CKTOTAL", "CKMB", "CKMBINDEX", "TROPONINI" in the last 168 hours. BNP: Invalid input(s): "POCBNP" CBG: Recent Labs  Lab 03/29/22 1617 03/29/22 2104 03/30/22 0343 03/30/22 0728  GLUCAP 132* 105* 130* 146*   D-Dimer No results for input(s): "DDIMER" in the last 72 hours. Hgb A1c Recent Labs    03/29/22 0652  HGBA1C 6.2*   Lipid Profile No results for input(s): "CHOL", "HDL", "LDLCALC", "TRIG", "CHOLHDL", "LDLDIRECT" in the last 72 hours. Thyroid function studies No results for input(s): "TSH", "T4TOTAL", "T3FREE", "THYROIDAB" in the last 72 hours.  Invalid input(s): "FREET3" Anemia work up No results for input(s): "VITAMINB12", "FOLATE", "FERRITIN", "TIBC", "IRON", "RETICCTPCT" in the last 72 hours. Urinalysis    Component Value Date/Time   COLORURINE YELLOW 01/06/2022 1927   APPEARANCEUR CLEAR 01/06/2022 1927   LABSPEC 1.005 01/06/2022 1927   PHURINE 6.0 01/06/2022 1927   GLUCOSEU NEGATIVE 01/06/2022 1927   HGBUR NEGATIVE 01/06/2022 1927   BILIRUBINUR NEGATIVE 01/06/2022 1927   KETONESUR NEGATIVE 01/06/2022 1927   PROTEINUR NEGATIVE 01/06/2022 1927   UROBILINOGEN 0.2 01/08/2011 1649   NITRITE NEGATIVE 01/06/2022 1927   LEUKOCYTESUR NEGATIVE 01/06/2022 1927   Sepsis Labs Recent Labs  Lab 03/29/22 0652 03/30/22 0437  WBC 13.6* 8.4   Microbiology No results found for this or any previous visit (from the past 240 hour(s)).  Time coordinating discharge:   SIGNED:  Irwin Brakeman, MD  [facility] 03/30/2022, 10:34 AM How to contact the [facility] Attending or Consulting provider [place] or covering provider during after hours [place], for this  patient?  Check the care team in [facility] and look for a) attending/consulting [facility] provider listed and b) the [facility] team listed Log into [email or web address] and use [place]'s universal password to access.  If you do not have the password, please contact the hospital operator. Locate the [facility] provider you are looking for under [facility] and page to a number that you can be directly reached. If you still have difficulty reaching the provider, please page the [facility] (Director on Call) for the Hospitalists listed on amion for assistance.

## 2022-03-30 NOTE — Plan of Care (Signed)
  Problem: Acute Rehab PT Goals(only PT should resolve) Goal: Pt Will Go Supine/Side To Sit Outcome: Progressing Flowsheets (Taken 03/30/2022 1052) Pt will go Supine/Side to Sit: with min guard assist Goal: Patient Will Transfer Sit To/From Stand Outcome: Progressing Flowsheets (Taken 03/30/2022 1052) Patient will transfer sit to/from stand: with min guard assist Goal: Pt Will Transfer Bed To Chair/Chair To Bed Outcome: Progressing Flowsheets (Taken 03/30/2022 1052) Pt will Transfer Bed to Chair/Chair to Bed: min guard assist Goal: Pt Will Ambulate Outcome: Progressing Flowsheets (Taken 03/30/2022 1052) Pt will Ambulate:  > 125 feet  with supervision  with rolling walker

## 2022-03-31 ENCOUNTER — Telehealth: Payer: Self-pay | Admitting: Physical Medicine and Rehabilitation

## 2022-03-31 NOTE — Telephone Encounter (Signed)
Pt called to reschedule appt. Pt is hospitalized at this time and need a call back. Pt phone number is [phone]

## 2022-04-01 ENCOUNTER — Ambulatory Visit: Payer: Medicare HMO | Admitting: Physical Medicine and Rehabilitation

## 2022-04-06 DIAGNOSIS — I82409 Acute embolism and thrombosis of unspecified deep veins of unspecified lower extremity: Secondary | ICD-10-CM | POA: Diagnosis not present

## 2022-04-06 DIAGNOSIS — I82552 Chronic embolism and thrombosis of left peroneal vein: Secondary | ICD-10-CM | POA: Diagnosis not present

## 2022-04-06 DIAGNOSIS — E1140 Type 2 diabetes mellitus with diabetic neuropathy, unspecified: Secondary | ICD-10-CM | POA: Diagnosis not present

## 2022-04-06 DIAGNOSIS — W108XXA Fall (on) (from) other stairs and steps, initial encounter: Secondary | ICD-10-CM | POA: Diagnosis not present

## 2022-04-06 DIAGNOSIS — I6930 Unspecified sequelae of cerebral infarction: Secondary | ICD-10-CM | POA: Diagnosis not present

## 2022-04-06 DIAGNOSIS — R590 Localized enlarged lymph nodes: Secondary | ICD-10-CM | POA: Diagnosis not present

## 2022-04-06 DIAGNOSIS — E876 Hypokalemia: Secondary | ICD-10-CM | POA: Diagnosis not present

## 2022-04-06 DIAGNOSIS — L03116 Cellulitis of left lower limb: Secondary | ICD-10-CM | POA: Diagnosis not present

## 2022-04-06 DIAGNOSIS — S2239XA Fracture of one rib, unspecified side, initial encounter for closed fracture: Secondary | ICD-10-CM | POA: Diagnosis not present

## 2022-04-07 ENCOUNTER — Encounter (HOSPITAL_COMMUNITY): Payer: Self-pay

## 2022-04-07 ENCOUNTER — Other Ambulatory Visit: Payer: Self-pay

## 2022-04-07 ENCOUNTER — Emergency Department (HOSPITAL_COMMUNITY): Payer: Medicare HMO

## 2022-04-07 ENCOUNTER — Emergency Department (HOSPITAL_COMMUNITY)
Admission: EM | Admit: 2022-04-07 | Discharge: 2022-04-08 | Disposition: A | Discharge status: Home / Self Care | Payer: Medicare HMO | Attending: Emergency Medicine | Admitting: Emergency Medicine

## 2022-04-07 DIAGNOSIS — Z7901 Long term (current) use of anticoagulants: Secondary | ICD-10-CM | POA: Diagnosis not present

## 2022-04-07 DIAGNOSIS — R609 Edema, unspecified: Secondary | ICD-10-CM

## 2022-04-07 DIAGNOSIS — L03115 Cellulitis of right lower limb: Secondary | ICD-10-CM | POA: Insufficient documentation

## 2022-04-07 DIAGNOSIS — E876 Hypokalemia: Secondary | ICD-10-CM | POA: Insufficient documentation

## 2022-04-07 DIAGNOSIS — M7989 Other specified soft tissue disorders: Secondary | ICD-10-CM | POA: Diagnosis not present

## 2022-04-07 DIAGNOSIS — R600 Localized edema: Secondary | ICD-10-CM | POA: Diagnosis not present

## 2022-04-07 DIAGNOSIS — Z79899 Other long term (current) drug therapy: Secondary | ICD-10-CM | POA: Insufficient documentation

## 2022-04-07 LAB — CBC WITH DIFFERENTIAL/PLATELET
Abs Immature Granulocytes: 0.03 10*3/uL (ref 0.00–0.07)
Basophils Absolute: 0.1 10*3/uL (ref 0.0–0.1)
Basophils Relative: 1 %
Eosinophils Absolute: 0.3 10*3/uL (ref 0.0–0.5)
Eosinophils Relative: 3 %
HCT: 33.8 % — ABNORMAL LOW (ref 36.0–46.0)
Hemoglobin: 11.1 g/dL — ABNORMAL LOW (ref 12.0–15.0)
Immature Granulocytes: 0 %
Lymphocytes Relative: 14 %
Lymphs Abs: 1.5 10*3/uL (ref 0.7–4.0)
MCH: 27.7 pg (ref 26.0–34.0)
MCHC: 32.8 g/dL (ref 30.0–36.0)
MCV: 84.3 fL (ref 80.0–100.0)
Monocytes Absolute: 0.8 10*3/uL (ref 0.1–1.0)
Monocytes Relative: 7 %
Neutro Abs: 8.5 10*3/uL — ABNORMAL HIGH (ref 1.7–7.7)
Neutrophils Relative %: 75 %
Platelets: 426 10*3/uL — ABNORMAL HIGH (ref 150–400)
RBC: 4.01 MIL/uL (ref 3.87–5.11)
RDW: 14.6 % (ref 11.5–15.5)
WBC: 11.3 10*3/uL — ABNORMAL HIGH (ref 4.0–10.5)
nRBC: 0 % (ref 0.0–0.2)

## 2022-04-07 LAB — BASIC METABOLIC PANEL
Anion gap: 11 (ref 5–15)
BUN: 17 mg/dL (ref 8–23)
CO2: 28 mmol/L (ref 22–32)
Calcium: 9.2 mg/dL (ref 8.9–10.3)
Chloride: 97 mmol/L — ABNORMAL LOW (ref 98–111)
Creatinine, Ser: 0.89 mg/dL (ref 0.44–1.00)
GFR, Estimated: >60 mL/min (ref >60)
Glucose, Bld: 135 mg/dL — ABNORMAL HIGH (ref 70–99)
Potassium: 2.8 mmol/L — ABNORMAL LOW (ref 3.5–5.1)
Sodium: 136 mmol/L (ref 135–145)

## 2022-04-07 LAB — PROTIME-INR
INR: 1.2 (ref 0.8–1.2)
Prothrombin Time: 15.3 seconds — ABNORMAL HIGH (ref 11.4–15.2)

## 2022-04-07 MED ORDER — POTASSIUM CHLORIDE CRYS ER 20 MEQ PO TBCR: 40.0000 meq | EXTENDED_RELEASE_TABLET | Freq: Every day | ORAL | 0 refills | Status: AC

## 2022-04-07 MED ORDER — ONDANSETRON HCL 4 MG/2ML IJ SOLN
4.0000 mg | Freq: Once | INTRAMUSCULAR | Status: AC
  Administered 2022-04-07: 4 mg via INTRAVENOUS
  Filled 2022-04-07: qty 2

## 2022-04-07 MED ORDER — HYDROMORPHONE HCL 1 MG/ML IJ SOLN
1.0000 mg | Freq: Once | INTRAMUSCULAR | Status: AC
  Administered 2022-04-07: 1 mg via INTRAVENOUS
  Filled 2022-04-07: qty 1

## 2022-04-07 MED ORDER — POTASSIUM CHLORIDE CRYS ER 20 MEQ PO TBCR
40.0000 meq | EXTENDED_RELEASE_TABLET | Freq: Once | ORAL | Status: AC
  Administered 2022-04-07: 40 meq via ORAL
  Filled 2022-04-07: qty 2

## 2022-04-07 MED ORDER — CEFAZOLIN SODIUM-DEXTROSE 1-4 GM/50ML-% IV SOLN
1.0000 g | Freq: Once | INTRAVENOUS | Status: AC
  Administered 2022-04-07: 1 g via INTRAVENOUS
  Filled 2022-04-07 (×2): qty 50

## 2022-04-07 MED ORDER — FUROSEMIDE 20 MG PO TABS: 20.0000 mg | ORAL_TABLET | Freq: Every day | ORAL | 0 refills | Status: AC

## 2022-04-07 MED ORDER — POTASSIUM CHLORIDE 10 MEQ/100ML IV SOLN
10.0000 meq | Freq: Once | INTRAVENOUS | Status: AC
  Administered 2022-04-07: 10 meq via INTRAVENOUS
  Filled 2022-04-07: qty 100

## 2022-04-07 MED ORDER — FUROSEMIDE 10 MG/ML IJ SOLN
20.0000 mg | Freq: Once | INTRAMUSCULAR | Status: AC
  Administered 2022-04-07: 20 mg via INTRAVENOUS
  Filled 2022-04-07: qty 2

## 2022-04-07 MED ORDER — CEPHALEXIN 500 MG PO CAPS: 500.0000 mg | ORAL_CAPSULE | Freq: Four times a day (QID) | ORAL | 0 refills | Status: DC

## 2022-04-07 NOTE — ED Triage Notes (Addendum)
Pt presents to ED with complaints of bilateral leg swelling since yesterday. Pt states she has been on lasix Pt also requesting additional pain medication for broken ribs.

## 2022-04-07 NOTE — ED Provider Notes (Signed)
[facility] EMERGENCY DEPARTMENT Provider Note   CSN: [id number] Arrival date & time: 04/07/22  1417     History {Add pertinent medical, surgical, social history, OB history to HPI:1} Chief Complaint  Patient presents with   Leg Swelling    Shelby Morgan is a 66 y.o. female with history of recent right fifth rib fracture presenting today due to increased bilateral lower extremity swelling, and redness, swelling, and increased pain of the right lower leg.  Recent hospital visits summarized below.  Switched from Eliquis to warfarin due to financial reasons, currently takes warfarin daily.  Saw PCP yesterday and was given a "antibiotic shot" and recommended to return in 1 week to evaluate for improvement.  However, notes it is difficult to walk around due to the pain from the lower extremity swelling.  Without new chest pain or shortness of breath.  No recent fevers, chills, N/V/D.  Currently taking Lasix 5 mg a day as needed, however still notes increasing swelling.  Has been sleeping in a recliner nightly since the rib fracture over a week ago, believes this may be worsening her lower extremity swelling.  Still complaining of remarkable pain of the right chest.  Hx of numbness of the left lower extremity at baseline due to previous stroke.  Hx of prior ORIF of right leg, though not recent.  Diagnosed 01/06/2022 with acute ischemic stroke, and also 01/14/2022 for right thalamic stroke.  Hospitalized again 02/08/2022 for peripheral edema, DVT of the left lower extremity, DOE, and physical deconditioning.  The history is provided by the patient and medical records.      Home Medications Prior to Admission medications   Medication Sig Start Date End Date Taking? Authorizing Provider  cephALEXin (KEFLEX) 500 MG capsule Take 1 capsule (500 mg total) by mouth 4 (four) times daily. 04/07/22  Yes Prince Rome, PA-C  furosemide (LASIX) 20 MG tablet Take 1 tablet (20 mg total) by mouth daily for 4  days. 04/07/22 04/11/22 Yes Prince Rome, PA-C  potassium chloride SA (KLOR-CON M) 20 MEQ tablet Take 2 tablets (40 mEq total) by mouth daily for 7 days. 04/07/22 04/14/22 Yes Prince Rome, PA-C  acetaminophen (TYLENOL) 325 MG tablet Take 2 tablets (650 mg total) by mouth every 6 (six) hours. 03/30/22   Johnson, Clanford L, MD  busPIRone (BUSPAR) 5 MG tablet TAKE 1 TABLET BY MOUTH TWICE A DAY 02/20/22   Kirsteins, Luanna Salk, MD  COLLAGEN PO Take 1 capsule by mouth daily.    [provider]  diclofenac Sodium (VOLTAREN) 1 % GEL Apply 4 g topically 4 (four) times daily. 01/28/22   Love, Ivan Anchors, PA-C  gabapentin (NEURONTIN) 100 MG capsule Take 1 capsule (100 mg total) by mouth 3 (three) times daily. 01/28/22   Love, Ivan Anchors, PA-C  glimepiride (AMARYL) 2 MG tablet Take 2 mg by mouth daily. 02/04/22   [provider]  hydrochlorothiazide (HYDRODIURIL) 25 MG tablet Take 25 mg by mouth 2 (two) times daily. 02/23/22   [provider]  lidocaine 4 % Place 1 patch onto the skin daily. 03/29/22   Godfrey Pick, MD  Magnesium 400 MG CAPS Take 400 mg by mouth daily.    [provider]  methocarbamol (ROBAXIN) 500 MG tablet Take 1 tablet (500 mg total) by mouth every 8 (eight) hours as needed for muscle spasms. 03/29/22   Godfrey Pick, MD  Multiple Vitamins-Minerals (CENTRUM SILVER 50+WOMEN) TABS Take 1 tablet by mouth daily.  [provider]  oxyCODONE-acetaminophen (PERCOCET/ROXICET) 5-325 MG tablet Take 1 tablet by mouth every 6 (six) hours as needed for severe pain. 03/29/22   Godfrey Pick, MD  pantoprazole (PROTONIX) 40 MG tablet Take 1 tablet (40 mg total) by mouth daily. 01/28/22   Love, Ivan Anchors, PA-C  polyvinyl alcohol (LIQUIFILM TEARS) 1.4 % ophthalmic solution Place 1 drop into the left eye as needed for dry eyes. 01/28/22   Love, Ivan Anchors, PA-C  rosuvastatin (CRESTOR) 20 MG tablet Take 1 tablet (20 mg total) by mouth daily. 01/28/22   Love, Ivan Anchors, PA-C   senna-docusate (SENOKOT-S) 8.6-50 MG tablet Take 2 tablets by mouth at bedtime. 03/30/22 04/29/22  Johnson, Clanford L, MD  TRADJENTA 5 MG TABS tablet TAKE 1 TABLET (5 MG TOTAL) BY MOUTH DAILY. 03/13/22   Jennye Boroughs, MD  traZODone (DESYREL) 50 MG tablet Take 1 tablet (50 mg total) by mouth at bedtime as needed for sleep. 01/28/22   Love, Ivan Anchors, PA-C  warfarin (COUMADIN) 5 MG tablet Take 1.5 tabs x 1 dose tonight (9/4) then resume taking 1 tab daily 03/30/22   Murlean Iba, MD      Allergies    Amlodipine and Codeine    Review of Systems   Review of Systems  Cardiovascular:  Positive for leg swelling.  Skin:  Positive for color change (Redness, right lower extremity).    Physical Exam Updated Vital Signs BP 123/72   Pulse 87   Temp 98.2 F (36.8 C)   Resp 14   Ht '5\' 2"'$  (1.575 m)   Wt 95.7 kg   SpO2 95%   BMI 38.59 kg/m  Physical Exam Vitals and nursing note reviewed.  Constitutional:      General: She is not in acute distress.    Appearance: She is well-developed. She is not ill-appearing, toxic-appearing or diaphoretic.  HENT:     Head: Normocephalic and atraumatic.     Mouth/Throat:     Mouth: Mucous membranes are moist.     Pharynx: Oropharynx is clear.  Eyes:     Conjunctiva/sclera: Conjunctivae normal.  Cardiovascular:     Rate and Rhythm: Normal rate and regular rhythm.     Heart sounds: No murmur heard. Pulmonary:     Effort: Pulmonary effort is normal. No respiratory distress.     Breath sounds: Normal breath sounds.  Chest:     Chest wall: Tenderness (Known R 5th rib fx) present.  Abdominal:     Palpations: Abdomen is soft.     Tenderness: There is no abdominal tenderness.  Musculoskeletal:        General: Swelling and tenderness present.     Cervical back: Neck supple. No rigidity.     Right lower leg: Edema present.     Left lower leg: Edema present.     Comments: Bilateral lower extremity edema, 2+ up to the knees.  Increased edema with  erythema, warmth, and increased tenderness of the right lower extremity, closest to the ankle area.  See photos.  Skin:    General: Skin is warm and dry.     Capillary Refill: Capillary refill takes less than 2 seconds.     Coloration: Skin is not jaundiced or pale.     Findings: Erythema present.  Neurological:     Mental Status: She is alert and oriented to person, place, and time.  Psychiatric:        Mood and Affect: Mood normal.  ED Results / Procedures / Treatments   Labs (all labs ordered are listed, but only abnormal results are displayed) Labs Reviewed  BASIC METABOLIC PANEL - Abnormal; Notable for the following components:      Result Value   Potassium 2.8 (*)    Chloride 97 (*)    Glucose, Bld 135 (*)    All other components within normal limits  CBC WITH DIFFERENTIAL/PLATELET - Abnormal; Notable for the following components:   WBC 11.3 (*)    Hemoglobin 11.1 (*)    HCT 33.8 (*)    Platelets 426 (*)    Neutro Abs 8.5 (*)    All other components within normal limits  PROTIME-INR - Abnormal; Notable for the following components:   Prothrombin Time 15.3 (*)    All other components within normal limits    EKG EKG Interpretation  Date/Time:  Tuesday April 07 2022 22:58:05 EDT Ventricular Rate:  83 PR Interval:  200 QRS Duration: 84 QT Interval:  383 QTC Calculation: 450 R Axis:   -8 Text Interpretation: Sinus rhythm Low voltage, precordial leads Abnormal R-wave progression, early transition Left ventricular hypertrophy Borderline T abnormalities, diffuse leads When compared with ECG of 02/08/2022, No significant change was found Confirmed by Delora Fuel ([id number]) on 04/07/2022 11:25:48 PM  Radiology DG Tibia/Fibula Right  Result Date: 04/07/2022 CLINICAL DATA:  Pain/swelling EXAM: RIGHT TIBIA AND FIBULA - 2 VIEW COMPARISON:  None Available. FINDINGS: Compression plate and screw fixation of the distal fibula. Additional K-wire and screw fixation of  the medial malleolus. No evidence of hardware complication. No fracture or dislocation is seen. The ankle mortise is intact. Mild degenerative changes of the knee. Mild diffuse soft tissue swelling of the distal calf/ankle. No suprapatellar knee joint effusion. IMPRESSION: Status post ORIF of the distal tibia and fibula, without evidence of complication. Mild diffuse soft tissue swelling of the distal calf/ankle. Electronically Signed   By: Julian Hy M.D.   On: 04/07/2022 22:06    Procedures Procedures  {Document cardiac monitor, telemetry assessment procedure when appropriate:1}  Medications Ordered in ED Medications  ceFAZolin (ANCEF) IVPB 1 g/50 mL premix (has no administration in time range)  potassium chloride 10 mEq in 100 mL IVPB (10 mEq Intravenous New Bag/Given 04/07/22 2257)  HYDROmorphone (DILAUDID) injection 1 mg (1 mg Intravenous Given 04/07/22 2217)  ondansetron (ZOFRAN) injection 4 mg (4 mg Intravenous Given 04/07/22 2216)  potassium chloride SA (KLOR-CON M) CR tablet 40 mEq (40 mEq Oral Given 04/07/22 2253)  furosemide (LASIX) injection 20 mg (20 mg Intravenous Given 04/07/22 2308)    ED Course/ Medical Decision Making/ A&P                           Medical Decision Making  66 y.o. female presents to the ED for concern of Leg Swelling     This involves an extensive number of treatment options, and is a complaint that carries with it a high risk of complications and morbidity.  The emergent differential diagnosis prior to evaluation includes, but is not limited to: Cellulitis, DVT  This is not an exhaustive differential.   Past Medical History / Co-morbidities / Social History: Hx of anxiety, GERD, obesity, DMT2, physical deconditioning, primary HTN, prior CVAs, gout, recent fifth right rib fracture. Hx of prior ORIF of right leg, though not recent.  Diagnosed 01/06/2022 with acute ischemic stroke, and also 01/14/2022 for right thalamic stroke.  Hospitalized again  02/08/2022 for peripheral  edema, DVT of the left lower extremity, DOE, and physical deconditioning. Social Determinants of Health include: Elderly  Additional History:  Obtained by chart review.  Notably recent ED hospital visits with admission, these include 03/29/2022, 02/08/2022, 01/14/2022, and 01/06/2022.  See for details  Lab Tests: I ordered, and personally interpreted labs.  The pertinent results include:   WBC 11.1 with leukocytosis, however does not meet SIRS or sepsis criteria Potassium 2.8, chloride 97, without hyponatremia Mild anemia, H&H stable  Imaging Studies: I ordered imaging studies including XR tib-fib.   I independently visualized and interpreted imaging which showed no evidence of fracture, gas formation, or osteomyelitis I agree with the radiologist interpretation.  Cardiac Monitoring: The patient was maintained on a cardiac monitor.  I personally viewed and interpreted the cardiac monitored which showed an underlying rhythm of: NSR  ED Course / Critical Interventions: Pt well-appearing on exam.  Afebrile, nontoxic, nonseptic appearing in NAD.  Presenting with increased bilateral lower extremity swelling, and possible cellulitis of the right lower extremity.  Low suspicion for DVT, due to patient compliance with daily warfarin.  Has been on warfarin since 03/29/2022, prior to that was on Eliquis since at least 02/08/2022.  Compartments soft, without evidence of ischemia or compartment syndrome.  Physical exam and XR not suggestive of necrotizing fasciitis.  XR does not indicate osteomyelitis.  ORIF visualized without evidence of complication.  Does not meet SIRS or sepsis criteria at this time.  Pain managed in the ED.  Notable hypokalemia, plan to replenish alongside antibiotic therapy for likely cellulitis. Upon reevaluation, pain has improved with Dilaudid.  EKG without acute changes.  Discharge summary from 03/29/2022 indicates instruction to stop taking the furosemide 20 mg,  however patient states she is taking 5 mg Lasix daily.  Unable to find documentation supports this.  Plan to diurese with Lasix simultaneously as potassium replenishment. Patient tolerating potassium replenishment and diuresis without complication upon re-evaluation.  Anticipate discharge with antibiotics for cellulitis and close PCP follow-up for reevaluation.  Also provided short course of daily Lasix and 1 week course of p.o. potassium replenishment. I have reviewed the patients home medicines and have made adjustments as needed.  Disposition: After consideration the patient's encounter today, I do not feel today's workup suggests an emergent condition requiring admission or immediate intervention beyond what has been performed at this time.  Safe for discharge; instructed to return immediately for worsening symptoms, change in symptoms or any other concerns.  I have reviewed the patients home medicines and have made adjustments as needed.  Discussed course of treatment with the patient, whom demonstrated understanding.  Patient in agreement and has no further questions.    I discussed this case with my attending, Dr. Ashok Cordia, who agreed with the proposed treatment course and cosigned this note including patient's presenting symptoms, physical exam, and planned diagnostics and interventions.  Attending physician stated agreement with plan or made changes to plan which were implemented.     This chart was dictated using voice recognition software.  Despite best efforts to proofread, errors can occur which can change the documentation meaning.   {Document critical care time when appropriate:1} {Document review of labs and clinical decision tools ie heart score, Chads2Vasc2 etc:1}  {Document your independent review of radiology images, and any outside records:1} {Document your discussion with family members, caretakers, and with consultants:1} {Document social determinants of health affecting pt's  care:1} {Document your decision making why or why not admission, treatments were needed:1} Final Clinical Impression(s) /  ED Diagnoses Final diagnoses:  Hypokalemia  Peripheral edema  Cellulitis of right lower extremity    Rx / DC Orders ED Discharge Orders          Ordered    potassium chloride SA (KLOR-CON M) 20 MEQ tablet  Daily        04/07/22 2340    furosemide (LASIX) 20 MG tablet  Daily        04/07/22 2340    cephALEXin (KEFLEX) 500 MG capsule  4 times daily        04/07/22 2340

## 2022-04-07 NOTE — Discharge Instructions (Addendum)
Follow-up with your PCP within the next 2 to 3 days for reevaluation and continued medical management.  An antibiotic by the name of Keflex has been sent to your pharmacy.  Take 1 capsule every 6 hours for the next 5 days, starting tomorrow.  Always take with plenty of food and water.  A prescription for oral potassium has been sent to your pharmacy.  Take 40 mg (2 tablets) daily for the next week.  Take with plenty of food and water.  I have also sent a small refill for Lasix 20 mg daily for the next 4 days.  This is to be reevaluated by your PCP whether this may need to be continued as needed.  Continue taking your warfarin and other medications as prescribed.  Return to the ED for new or worsening symptoms as discussed.

## 2022-04-12 ENCOUNTER — Other Ambulatory Visit: Payer: Self-pay | Admitting: Physical Medicine & Rehabilitation

## 2022-04-14 ENCOUNTER — Other Ambulatory Visit: Payer: Self-pay | Admitting: Physical Medicine & Rehabilitation

## 2022-04-16 DIAGNOSIS — I129 Hypertensive chronic kidney disease with stage 1 through stage 4 chronic kidney disease, or unspecified chronic kidney disease: Secondary | ICD-10-CM | POA: Diagnosis not present

## 2022-04-16 DIAGNOSIS — Z6838 Body mass index (BMI) 38.0-38.9, adult: Secondary | ICD-10-CM | POA: Diagnosis not present

## 2022-04-16 DIAGNOSIS — I6930 Unspecified sequelae of cerebral infarction: Secondary | ICD-10-CM | POA: Diagnosis not present

## 2022-04-16 DIAGNOSIS — E1140 Type 2 diabetes mellitus with diabetic neuropathy, unspecified: Secondary | ICD-10-CM | POA: Diagnosis not present

## 2022-04-16 DIAGNOSIS — S2239XA Fracture of one rib, unspecified side, initial encounter for closed fracture: Secondary | ICD-10-CM | POA: Diagnosis not present

## 2022-04-16 DIAGNOSIS — I10 Essential (primary) hypertension: Secondary | ICD-10-CM | POA: Diagnosis not present

## 2022-04-16 DIAGNOSIS — E876 Hypokalemia: Secondary | ICD-10-CM | POA: Diagnosis not present

## 2022-04-16 DIAGNOSIS — L03116 Cellulitis of left lower limb: Secondary | ICD-10-CM | POA: Diagnosis not present

## 2022-04-20 ENCOUNTER — Ambulatory Visit: Payer: Medicare HMO | Admitting: Physical Medicine and Rehabilitation

## 2022-04-22 ENCOUNTER — Other Ambulatory Visit: Payer: Self-pay

## 2022-04-22 NOTE — Patient Outreach (Signed)
  Care Coordination   04/22/2022 Name: Shelby Morgan MRN: [id number] DOB: Oct 12, 1955   First telephone outreach attempt to obtain mRS. No answer. Unable to message for returned call.  Philmore Pali [facility] Management Assistant [phone]

## 2022-04-28 ENCOUNTER — Other Ambulatory Visit: Payer: Self-pay

## 2022-04-28 NOTE — Patient Outreach (Signed)
  Care Coordination   04/28/2022 Name: ZAURIA DOMBEK MRN: [id number] DOB: [birth year]   Second telephone outreach attempt to obtain mRS. No answer. Left message for returned call.  Philmore Pali [facility] Management Assistant [phone]

## 2022-04-29 ENCOUNTER — Ambulatory Visit: Payer: Medicare HMO | Admitting: Neurology

## 2022-04-29 ENCOUNTER — Encounter: Payer: Self-pay | Admitting: Neurology

## 2022-04-29 ENCOUNTER — Other Ambulatory Visit: Payer: Self-pay

## 2022-04-29 VITALS — BP 151/88 | HR 82 | Ht 62.0 in | Wt 208.8 lb

## 2022-04-29 DIAGNOSIS — I6381 Other cerebral infarction due to occlusion or stenosis of small artery: Secondary | ICD-10-CM

## 2022-04-29 DIAGNOSIS — R202 Paresthesia of skin: Secondary | ICD-10-CM | POA: Diagnosis not present

## 2022-04-29 DIAGNOSIS — G4733 Obstructive sleep apnea (adult) (pediatric): Secondary | ICD-10-CM | POA: Diagnosis not present

## 2022-04-29 MED ORDER — GABAPENTIN 300 MG PO CAPS: 300.0000 mg | ORAL_CAPSULE | Freq: Three times a day (TID) | ORAL | 11 refills | Status: DC

## 2022-04-29 NOTE — Patient Instructions (Signed)
I had a long d/w patient about his recent stroke, risk for recurrent stroke/TIAs, personally independently reviewed imaging studies and stroke evaluation results and answered questions.Continue warfarin daily  for her recent DVT for secondary stroke prevention and maintain strict control of hypertension with blood pressure goal below 130/90, diabetes with hemoglobin A1c goal below 6.5% and lipids with LDL cholesterol goal below 70 mg/dL. I also advised the patient to eat a healthy diet with plenty of whole grains, cereals, fruits and vegetables, exercise regularly and maintain ideal body weight .increase gabapentin to 300 mg 3 times daily for postop paresthesias.  He was counseled to be compliant with using her CPAP for her sleep apnea.  And participation in sleep study.  He was encouraged to use a cane at all times.  Also advised her to increase participation in cognitively challenging activities like solving crossword puzzles, playing bridge and sudoku.  Followup in the future with my nurse practitioner in 6 months or call earlier if necessary.  Stroke Prevention Some medical conditions and behaviors can lead to a higher chance of having a stroke. You can help prevent a stroke by eating healthy, exercising, not smoking, and managing any medical conditions you have. Stroke is a leading cause of functional impairment. Primary prevention is particularly important because a majority of strokes are first-time events. Stroke changes the lives of not only those who experience a stroke but also their family and other caregivers. How can this condition affect me? A stroke is a medical emergency and should be treated right away. A stroke can lead to brain damage and can sometimes be life-threatening. If a person gets medical treatment right away, there is a better chance of surviving and recovering from a stroke. What can increase my risk? The following medical conditions may increase your risk of a  stroke: Cardiovascular disease. High blood pressure (hypertension). Diabetes. High cholesterol. Sickle cell disease. Blood clotting disorders (hypercoagulable state). Obesity. Sleep disorders (obstructive sleep apnea). Other risk factors include: Being older than age 65. Having a history of blood clots, stroke, or mini-stroke (transient ischemic attack, TIA). Genetic factors, such as race, ethnicity, or a family history of stroke. Smoking cigarettes or using other tobacco products. Taking birth control pills, especially if you also use tobacco. Heavy use of alcohol or drugs, especially cocaine and methamphetamine. Physical inactivity. What actions can I take to prevent this? Manage your health conditions High cholesterol levels. Eating a healthy diet is important for preventing high cholesterol. If cholesterol cannot be managed through diet alone, you may need to take medicines. Take any prescribed medicines to control your cholesterol as told by your health care provider. Hypertension. To reduce your risk of stroke, try to keep your blood pressure below 130/80. Eating a healthy diet and exercising regularly are important for controlling blood pressure. If these steps are not enough to manage your blood pressure, you may need to take medicines. Take any prescribed medicines to control hypertension as told by your health care provider. Ask your health care provider if you should monitor your blood pressure at home. Have your blood pressure checked every year, even if your blood pressure is normal. Blood pressure increases with age and some medical conditions. Diabetes. Eating a healthy diet and exercising regularly are important parts of managing your blood sugar (glucose). If your blood sugar cannot be managed through diet and exercise, you may need to take medicines. Take any prescribed medicines to control your diabetes as told by your health care provider. Get  evaluated for  obstructive sleep apnea. Talk to your health care provider about getting a sleep evaluation if you snore a lot or have excessive sleepiness. Make sure that any other medical conditions you have, such as atrial fibrillation or atherosclerosis, are managed. Nutrition Follow instructions from your health care provider about what to eat or drink to help manage your health condition. These instructions may include: Reducing your daily calorie intake. Limiting how much salt (sodium) you use to 1,500 milligrams (mg) each day. Using only healthy fats for cooking, such as olive oil, canola oil, or sunflower oil. Eating healthy foods. You can do this by: Choosing foods that are high in fiber, such as whole grains, and fresh fruits and vegetables. Eating at least 5 servings of fruits and vegetables a day. Try to fill one-half of your plate with fruits and vegetables at each meal. Choosing lean protein foods, such as lean cuts of meat, poultry without skin, fish, tofu, beans, and nuts. Eating low-fat dairy products. Avoiding foods that are high in sodium. This can help lower blood pressure. Avoiding foods that have saturated fat, trans fat, and cholesterol. This can help prevent high cholesterol. Avoiding processed and prepared foods. Counting your daily carbohydrate intake.  Lifestyle If you drink alcohol: Limit how much you have to: 0-1 drink a day for women who are not pregnant. 0-2 drinks a day for men. Know how much alcohol is in your drink. In the [place] one drink equals one 12 oz bottle of beer (33m), one 5 oz glass of wine (1416m, or one 1 oz glass of hard liquor (4481m Do not use any products that contain nicotine or tobacco. These products include cigarettes, chewing tobacco, and vaping devices, such as e-cigarettes. If you need help quitting, ask your health care provider. Avoid secondhand smoke. Do not use drugs. Activity  Try to stay at a healthy weight. Get at least 30 minutes of  exercise on most days, such as: Fast walking. Biking. Swimming. Medicines Take over-the-counter and prescription medicines only as told by your health care provider. Aspirin or blood thinners (antiplatelets or anticoagulants) may be recommended to reduce your risk of forming blood clots that can lead to stroke. Avoid taking birth control pills. Talk to your health care provider about the risks of taking birth control pills if: You are over 35 10ars old. You smoke. You get very bad headaches. You have had a blood clot. Where to find more information American Stroke Association: www.strokeassociation.org Get help right away if: You or a loved one has any symptoms of a stroke. "BE FAST" is an easy way to remember the main warning signs of a stroke: B - Balance. Signs are dizziness, sudden trouble walking, or loss of balance. E - Eyes. Signs are trouble seeing or a sudden change in vision. F - Face. Signs are sudden weakness or numbness of the face, or the face or eyelid drooping on one side. A - Arms. Signs are weakness or numbness in an arm. This happens suddenly and usually on one side of the body. S - Speech. Signs are sudden trouble speaking, slurred speech, or trouble understanding what people say. T - Time. Time to call emergency services. Write down what time symptoms started. You or a loved one has other signs of a stroke, such as: A sudden, severe headache with no known cause. Nausea or vomiting. Seizure. These symptoms may represent a serious problem that is an emergency. Do not wait to see if the symptoms will  go away. Get medical help right away. Call your local emergency services (911 in the [place] Do not drive yourself to the hospital. Summary You can help to prevent a stroke by eating healthy, exercising, not smoking, limiting alcohol intake, and managing any medical conditions you may have. Do not use any products that contain nicotine or tobacco. These include cigarettes,  chewing tobacco, and vaping devices, such as e-cigarettes. If you need help quitting, ask your health care provider. Remember "BE FAST" for warning signs of a stroke. Get help right away if you or a loved one has any of these signs. This information is not intended to replace advice given to you by your health care provider. Make sure you discuss any questions you have with your health care provider. Document Revised: 02/12/2020 Document Reviewed: 02/12/2020 Elsevier Patient Education  [place].

## 2022-04-29 NOTE — Progress Notes (Signed)
[facility] [address] [place]. [place] [address] [phone]       OFFICE FOLLOW-UP NOTE  Ms. JAMIEKA ROYLE Date of Birth:  [birth year] Medical Record Number:  [id number]   HPI: Ms. Perine is a 66 year old pleasant Caucasian lady seen today for initial office follow-up visit following hospital admission for stroke in June 2023.  She is accompanied by a friend.  History is obtained from them and review of electronic medical records and personally reviewed pertinent available imaging films in PACS.  She has past medical history of diabetes, hypertension, hyperlipidemia, obesity, left thalamic infarct in September 2022 and at risk for sleep apnea.  She presented 01/06/2022 with sudden onset of left-sided weakness and paresthesias.  She met inclusion exclusion criteria and was given IV TNK uneventfully.  She showed some improvement in her deficits but MRI scan did confirm right thalamic infarct which was felt to be due to small vessel disease.  CT angiogram showed multifocal moderate to severe right M3, bilateral P2 and distal left P3 stenosis which was unchanged compared to previous CT angio from September 2022.  2D echo showed ejection fraction of 60 to 65%.  LDL cholesterol 82 mg percent.  Hemoglobin A1c of 7.1.  Urine drug screen was negative.  Patient was on aspirin prior to admission and is she was switched to aspirin and Plavix for 3 weeks and then followed by Plavix alone.  Patient expressed interest in and signed consent and participated in the sleep smart study.  She was randomized to CPAP treatment arm. Patient states she continues to have paresthesias in the left side of the body.  She has been taking gabapentin 100 mg 3 times daily but finds it only partially effective but she is tolerating it well without side effects.  She fell 2 weeks ago and has sustained rib fracture and not been compliant recently with using her CPAP for his sleep apnea.  She is also developed some  cellulitis in the right leg and has been treated with antibiotics for that.  She also developed deep vein thrombosis on July 4 and was started on Eliquis but patient could not afford the co-pay so she has now switched to warfarin which is tolerating well without bruising or bleeding. ROS:   14 system review of systems is positive for foot pain, tingling, numbness, difficulty walking , DVT all other systems negative  PMH:  Past Medical History:  Diagnosis Date   Anxiety    Bell's palsy 01/08/2011   Chronic back pain    Depression    Diabetes mellitus    Diverticulosis    GERD (gastroesophageal reflux disease)    Hemorrhoids    Hepatomegaly    Hypertension    Lumbar stenosis    L2/L3--followed by Dr. 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    Employer: SELF-EMPLOYED    Comment: part time  Tobacco Use   Smoking status: Former    Packs/day: 0.50    Years: 11.00  Total pack years: 5.50    Types: Cigarettes    Quit date: 03/03/1993    Years since quitting: 29.1   Smokeless tobacco: Never  Vaping Use   Vaping Use: Never used  Substance and Sexual Activity   Alcohol use: Yes    Comment: wine 2-3 glasses per weekend   Drug use: No   Sexual activity: Yes    Birth control/protection: Post-menopausal  Other Topics Concern   Not on file  Social History Narrative   Not on file   Social Determinants of Health   Financial Resource Strain: Unknown (06/07/2018)   Overall Financial Resource Strain (CARDIA)    Difficulty of Paying Living Expenses: Patient refused   Food Insecurity: Unknown (06/07/2018)   Hunger Vital Sign    Worried About Running Out of Food in the Last Year: Patient refused    [place] in the Last Year: Patient refused  Transportation Needs: Unknown (06/07/2018)   PRAPARE - Transportation    Lack of Transportation (Medical): Patient refused    Lack of Transportation (Non-Medical): Patient refused  Physical Activity: Unknown (06/07/2018)   Exercise Vital Sign    Days of Exercise per Week: Patient refused    Minutes of Exercise per Session: Patient refused  Stress: Unknown (06/07/2018)   [place] of Stress : Patient refused  Social Connections: Unknown (06/07/2018)   Social Connection and Isolation Panel [NHANES]    Frequency of Communication with Friends and Family: Patient refused    Frequency of Social Gatherings with Friends and Family: Patient refused    Attends Religious Services: Patient refused    Active Member of Clubs or Organizations: Patient refused    Attends Archivist Meetings: Patient refused    Marital Status: Patient refused  Intimate Partner Violence: Unknown (06/07/2018)   Humiliation, Afraid, Rape, and Kick questionnaire    Fear of Current or Ex-Partner: Patient refused    Emotionally Abused: Patient refused    Physically Abused: Patient refused    Sexually Abused: Patient refused    Medications:   Current Outpatient Medications on File Prior to Visit  Medication Sig Dispense Refill   acetaminophen (TYLENOL) 325 MG tablet Take 2 tablets (650 mg total) by mouth every 6 (six) hours.     busPIRone (BUSPAR) 5 MG tablet TAKE 1 TABLET BY MOUTH TWICE A DAY 60 tablet 0   cephALEXin (KEFLEX) 500 MG capsule Take 1 capsule (500 mg total) by mouth 4 (four) times daily. 20 capsule 0   COLLAGEN PO Take 1 capsule by mouth daily.     diclofenac Sodium (VOLTAREN) 1 % GEL Apply 4 g topically 4 (four) times daily. 350 g 0    glimepiride (AMARYL) 2 MG tablet Take 2 mg by mouth daily.     hydrochlorothiazide (HYDRODIURIL) 25 MG tablet Take 25 mg by mouth 2 (two) times daily.     lidocaine 4 % Place 1 patch onto the skin daily. 10 patch 0   Magnesium 400 MG CAPS Take 400 mg by mouth daily.     methocarbamol (ROBAXIN) 500 MG tablet Take 1 tablet (500 mg total) by mouth every 8 (eight) hours as needed for muscle spasms. 20 tablet 0   Multiple Vitamins-Minerals (CENTRUM SILVER 50+WOMEN) TABS Take 1 tablet by mouth daily.     pantoprazole (PROTONIX) 40 MG tablet Take 1 tablet (40 mg total) by mouth daily. 30 tablet 0   polyvinyl alcohol (LIQUIFILM  TEARS) 1.4 % ophthalmic solution Place 1 drop into the left eye as needed for dry eyes. 15 mL 0   rosuvastatin (CRESTOR) 20 MG tablet Take 1 tablet (20 mg total) by mouth daily. 30 tablet 1   senna-docusate (SENOKOT-S) 8.6-50 MG tablet Take 2 tablets by mouth at bedtime. 60 tablet 0   TRADJENTA 5 MG TABS tablet TAKE 1 TABLET (5 MG TOTAL) BY MOUTH DAILY. 30 tablet 0   warfarin (COUMADIN) 5 MG tablet Take 1.5 tabs x 1 dose tonight (9/4) then resume taking 1 tab daily     furosemide (LASIX) 20 MG tablet Take 1 tablet (20 mg total) by mouth daily for 4 days. 4 tablet 0   potassium chloride SA (KLOR-CON M) 20 MEQ tablet Take 2 tablets (40 mEq total) by mouth daily for 7 days. 14 tablet 0   No current facility-administered medications on file prior to visit.    Allergies:   Allergies  Allergen Reactions   Amlodipine Swelling   Codeine Nausea And Vomiting    Physical Exam General: Obese middle-aged Caucasian lady seated, in no evident distress Head: head normocephalic and atraumatic.  Neck: supple with no carotid or supraclavicular bruits Cardiovascular: regular rate and rhythm, no murmurs Musculoskeletal: no deformity Skin:  no rash/petichiae Vascular:  Normal pulses all extremities Vitals:   04/29/22 1111  BP: (!) 151/88  Pulse: 82   Neurologic Exam Mental Status:  Awake and fully alert. Oriented to place and time. Recent and remote memory intact. Attention span, concentration and fund of knowledge appropriate. Mood and affect appropriate.  Cranial Nerves: Fundoscopic exam reveals sharp disc margins. Pupils equal, briskly reactive to light. Extraocular movements full without nystagmus. Visual fields full to confrontation. Hearing intact. Facial sensation intact. Face, tongue, palate moves normally and symmetrically.  Motor: Normal bulk and tone. Normal strength in all tested extremity muscles.  Diminished fine finger movements on the left.  Distractible left upper extremity.  Trace weakness of left grip. Sensory.:  Impaired touch ,pinprick .position and vibratory sensation on the left hemibody..  Coordination: Rapid alternating movements normal in all extremities. Finger-to-nose and heel-to-shin performed impaired on the left Gait and Station: Arises from chair without difficulty. Stance is normal. Gait demonstrates normal stride length and balance . Able to heel, toe and tandem walk with moderate difficulty.  Reflexes: 1+ and symmetric. Toes downgoing.   NIHSS  3 Modified Rankin  2   ASSESSMENT: 66 year old Caucasian lady with right thalamic lacunar infarct in June 2023 from small vessel disease.  Vascular risk factors hypertension, diabetes, hyperlipidemia, obesity, sleep apnea old stroke.  Patient is participating in the sleep smart study and is randomized to CPAP treatment.  She has recently developed DVT in July 2023 and started on warfarin as she cannot afford Eliquis.    PLAN:I had a long d/w patient about his recent stroke, risk for recurrent stroke/TIAs, personally independently reviewed imaging studies and stroke evaluation results and answered questions.Continue warfarin daily  for her recent DVT for secondary stroke prevention and maintain strict control of hypertension with blood pressure goal below 130/90, diabetes with hemoglobin A1c goal below  6.5% and lipids with LDL cholesterol goal below 70 mg/dL. I also advised the patient to eat a healthy diet with plenty of whole grains, cereals, fruits and vegetables, exercise regularly and maintain ideal body weight .increase gabapentin to 300 mg 3 times daily for postop paresthesias.  He was counseled to be compliant with using her CPAP for her sleep apnea.  And  participation in sleep study.  He was encouraged to use a cane at all times.  Also advised her to increase participation in cognitively challenging activities like solving crossword puzzles, playing bridge and sudoku.  Followup in the future with my nurse practitioner in 6 months or call earlier if necessary. Greater than 50% of time during this 35 minute visit was spent on counseling,explanation of diagnosis of stroke and sleep apnea, planning of further management, discussion with patient and family and coordination of care Antony Contras, MD Note: This document was prepared with digital dictation and possible smart phrase technology. Any transcriptional errors that result from this process are unintentional

## 2022-04-29 NOTE — Patient Outreach (Addendum)
  Care Coordination   04/29/2022 Name: Shelby Morgan MRN: [id number] DOB: 08-24-1955   No telephone outreach to patient to obtain mRS. As was successfully completed by Dr. Leonie Man 04/29/22. MRS=2  [place] Care Management Assistant [phone]

## 2022-05-04 ENCOUNTER — Emergency Department (HOSPITAL_COMMUNITY)
Admission: EM | Admit: 2022-05-04 | Discharge: 2022-05-04 | Disposition: A | Discharge status: Home / Self Care | Payer: Medicare HMO | Attending: Emergency Medicine | Admitting: Emergency Medicine

## 2022-05-04 ENCOUNTER — Encounter (HOSPITAL_COMMUNITY): Payer: Self-pay | Admitting: Emergency Medicine

## 2022-05-04 ENCOUNTER — Other Ambulatory Visit: Payer: Self-pay

## 2022-05-04 DIAGNOSIS — Z7901 Long term (current) use of anticoagulants: Secondary | ICD-10-CM | POA: Insufficient documentation

## 2022-05-04 DIAGNOSIS — L03116 Cellulitis of left lower limb: Secondary | ICD-10-CM | POA: Diagnosis not present

## 2022-05-04 DIAGNOSIS — M7989 Other specified soft tissue disorders: Secondary | ICD-10-CM | POA: Diagnosis present

## 2022-05-04 DIAGNOSIS — Z6837 Body mass index (BMI) 37.0-37.9, adult: Secondary | ICD-10-CM | POA: Diagnosis not present

## 2022-05-04 DIAGNOSIS — E6609 Other obesity due to excess calories: Secondary | ICD-10-CM | POA: Diagnosis not present

## 2022-05-04 DIAGNOSIS — L03115 Cellulitis of right lower limb: Secondary | ICD-10-CM | POA: Diagnosis not present

## 2022-05-04 DIAGNOSIS — I6930 Unspecified sequelae of cerebral infarction: Secondary | ICD-10-CM | POA: Diagnosis not present

## 2022-05-04 LAB — BASIC METABOLIC PANEL
Anion gap: 9 (ref 5–15)
BUN: 22 mg/dL (ref 8–23)
CO2: 28 mmol/L (ref 22–32)
Calcium: 9.7 mg/dL (ref 8.9–10.3)
Chloride: 104 mmol/L (ref 98–111)
Creatinine, Ser: 1.09 mg/dL — ABNORMAL HIGH (ref 0.44–1.00)
GFR, Estimated: 56 mL/min — ABNORMAL LOW (ref >60)
Glucose, Bld: 95 mg/dL (ref 70–99)
Potassium: 3.3 mmol/L — ABNORMAL LOW (ref 3.5–5.1)
Sodium: 141 mmol/L (ref 135–145)

## 2022-05-04 LAB — CBC WITH DIFFERENTIAL/PLATELET
Abs Immature Granulocytes: 0.01 10*3/uL (ref 0.00–0.07)
Basophils Absolute: 0.1 10*3/uL (ref 0.0–0.1)
Basophils Relative: 1 %
Eosinophils Absolute: 0.3 10*3/uL (ref 0.0–0.5)
Eosinophils Relative: 5 %
HCT: 35.8 % — ABNORMAL LOW (ref 36.0–46.0)
Hemoglobin: 11.5 g/dL — ABNORMAL LOW (ref 12.0–15.0)
Immature Granulocytes: 0 %
Lymphocytes Relative: 30 %
Lymphs Abs: 2.1 10*3/uL (ref 0.7–4.0)
MCH: 27.3 pg (ref 26.0–34.0)
MCHC: 32.1 g/dL (ref 30.0–36.0)
MCV: 84.8 fL (ref 80.0–100.0)
Monocytes Absolute: 0.8 10*3/uL (ref 0.1–1.0)
Monocytes Relative: 11 %
Neutro Abs: 3.8 10*3/uL (ref 1.7–7.7)
Neutrophils Relative %: 53 %
Platelets: 365 10*3/uL (ref 150–400)
RBC: 4.22 MIL/uL (ref 3.87–5.11)
RDW: 14.6 % (ref 11.5–15.5)
WBC: 7 10*3/uL (ref 4.0–10.5)
nRBC: 0 % (ref 0.0–0.2)

## 2022-05-04 LAB — LACTIC ACID, PLASMA
Lactic Acid, Venous: 0.9 mmol/L (ref 0.5–1.9)
Lactic Acid, Venous: 0.9 mmol/L (ref 0.5–1.9)

## 2022-05-04 MED ORDER — VANCOMYCIN HCL IN DEXTROSE 1-5 GM/200ML-% IV SOLN
1000.0000 mg | Freq: Once | INTRAVENOUS | Status: AC
  Administered 2022-05-04: 1000 mg via INTRAVENOUS
  Filled 2022-05-04: qty 200

## 2022-05-04 MED ORDER — CLINDAMYCIN HCL 150 MG PO CAPS: 150.0000 mg | ORAL_CAPSULE | Freq: Three times a day (TID) | ORAL | 0 refills | Status: AC

## 2022-05-04 NOTE — ED Triage Notes (Signed)
Pt presents with right leg swelling and cellulitis, treated by PCP with antibiotics, not getting any better, sent for [place] of leg.

## 2022-05-04 NOTE — Discharge Instructions (Signed)
Elevate your leg when possible.  You have been scheduled to return here for an ultrasound for tomorrow.  Please call the scheduling department at [phone] tomorrow morning to have an ultrasound of your lower leg.  Start the clindamycin tomorrow.  Follow-up with your primary care provider for recheck.  Return to emergency department if you develop fever or worsening redness of your leg.

## 2022-05-05 ENCOUNTER — Ambulatory Visit (HOSPITAL_COMMUNITY)
Admission: RE | Admit: 2022-05-05 | Discharge: 2022-05-05 | Disposition: A | Discharge status: Home / Self Care | Payer: Medicare HMO | Source: Ambulatory Visit | Attending: Emergency Medicine | Admitting: Emergency Medicine

## 2022-05-05 DIAGNOSIS — M7989 Other specified soft tissue disorders: Secondary | ICD-10-CM | POA: Diagnosis not present

## 2022-05-05 DIAGNOSIS — M79604 Pain in right leg: Secondary | ICD-10-CM | POA: Diagnosis not present

## 2022-05-07 NOTE — ED Provider Notes (Signed)
[facility] EMERGENCY DEPARTMENT Provider Note   CSN: [id number] Arrival date & time: 05/04/22  1510     History  Chief Complaint  Patient presents with   Cellulitis Right Leg    Shelby Morgan is a 66 y.o. female.  HPI     Shelby Morgan is a 67 y.o. female who presents to the Emergency Department requesting evaluation of worsening cellulitis and swelling of her right lower leg.  She was seen here for same in September then followed up with her PCP and switched to another antibiotic. She was recently seen by PCP again and advised to come here for evaluation of DVT and possible IV antibiotic.  Pt states the redness of her leg is not improving.    She denies fever, chills, posterior calf pain, or recent injury.  Denies  sedentary lifestyle   Home Medications Prior to Admission medications   Medication Sig Start Date End Date Taking? Authorizing Provider  clindamycin (CLEOCIN) 150 MG capsule Take 1 capsule (150 mg total) by mouth 3 (three) times daily. 05/04/22  Yes Caoimhe Damron, PA-C  acetaminophen (TYLENOL) 325 MG tablet Take 2 tablets (650 mg total) by mouth every 6 (six) hours. 03/30/22   Johnson, Clanford L, MD  busPIRone (BUSPAR) 5 MG tablet TAKE 1 TABLET BY MOUTH TWICE A DAY 04/15/22   Kirsteins, Luanna Salk, MD  COLLAGEN PO Take 1 capsule by mouth daily.    [provider]  diclofenac Sodium (VOLTAREN) 1 % GEL Apply 4 g topically 4 (four) times daily. 01/28/22   Love, Ivan Anchors, PA-C  furosemide (LASIX) 20 MG tablet Take 1 tablet (20 mg total) by mouth daily for 4 days. 04/07/22 07/27/73  Prince Rome, PA-C  gabapentin (NEURONTIN) 300 MG capsule Take 1 capsule (300 mg total) by mouth 3 (three) times daily. 04/29/22   Garvin Fila, MD  glimepiride (AMARYL) 2 MG tablet Take 2 mg by mouth daily. 02/04/22   [provider]  hydrochlorothiazide (HYDRODIURIL) 25 MG tablet Take 25 mg by mouth 2 (two) times daily. 02/23/22   [provider]  lidocaine 4 %  Place 1 patch onto the skin daily. 03/29/22   Godfrey Pick, MD  Magnesium 400 MG CAPS Take 400 mg by mouth daily.    [provider]  methocarbamol (ROBAXIN) 500 MG tablet Take 1 tablet (500 mg total) by mouth every 8 (eight) hours as needed for muscle spasms. 03/29/22   Godfrey Pick, MD  Multiple Vitamins-Minerals (CENTRUM SILVER 50+WOMEN) TABS Take 1 tablet by mouth daily.    [provider]  pantoprazole (PROTONIX) 40 MG tablet Take 1 tablet (40 mg total) by mouth daily. 01/28/22   Love, Ivan Anchors, PA-C  polyvinyl alcohol (LIQUIFILM TEARS) 1.4 % ophthalmic solution Place 1 drop into the left eye as needed for dry eyes. 01/28/22   Love, Ivan Anchors, PA-C  potassium chloride SA (KLOR-CON M) 20 MEQ tablet Take 2 tablets (40 mEq total) by mouth daily for 7 days. 04/07/22 07/28/56  Prince Rome, PA-C  rosuvastatin (CRESTOR) 20 MG tablet Take 1 tablet (20 mg total) by mouth daily. 01/28/22   Love, Takeesha S, PA-C  TRADJENTA 5 MG TABS tablet TAKE 1 TABLET (5 MG TOTAL) BY MOUTH DAILY. 03/13/22   Jennye Boroughs, MD  warfarin (COUMADIN) 5 MG tablet Take 1.5 tabs x 1 dose tonight (9/4) then resume taking 1 tab daily 03/30/22   Murlean Iba, MD      Allergies  Amlodipine and Codeine    Review of Systems   Review of Systems  Constitutional:  Negative for appetite change, chills and fever.  Respiratory:  Negative for shortness of breath.   Cardiovascular:  Negative for chest pain.  Gastrointestinal:  Negative for abdominal pain, nausea and vomiting.  Genitourinary:  Negative for dysuria.  Musculoskeletal:           Skin:  Positive for color change. Negative for wound.  Neurological:  Negative for dizziness, weakness, numbness and headaches.    Physical Exam Updated Vital Signs BP (!) 144/78 (BP Location: Right Arm)   Pulse 81   Temp 99.3 F (37.4 C)   Resp 20   Ht '5\' 2"'$  (1.575 m)   Wt 95.7 kg   SpO2 98%   BMI 38.59 kg/m  Physical Exam Vitals and nursing note reviewed.   Constitutional:      General: She is not in acute distress.    Appearance: Normal appearance. She is not ill-appearing or toxic-appearing.  Cardiovascular:     Rate and Rhythm: Normal rate and regular rhythm.     Pulses: Normal pulses.  Pulmonary:     Effort: Pulmonary effort is normal. No respiratory distress.     Breath sounds: No wheezing or rhonchi.  Abdominal:     Palpations: Abdomen is soft.     Tenderness: There is no abdominal tenderness.  Musculoskeletal:        General: Swelling present. No deformity or signs of injury.     Comments: Localized near circumferential erythema to distal portion of the right lower leg. Mild edema of lower extremity to dorsal foot.  No open wounds, no erythema of the foot.   Skin:    General: Skin is warm.     Capillary Refill: Capillary refill takes less than 2 seconds.     Findings: Erythema present.  Neurological:     General: No focal deficit present.     Mental Status: She is alert.     Sensory: No sensory deficit.     Motor: No weakness.     ED Results / Procedures / Treatments   Labs (all labs ordered are listed, but only abnormal results are displayed) Labs Reviewed  CBC WITH DIFFERENTIAL/PLATELET - Abnormal; Notable for the following components:      Result Value   Hemoglobin 11.5 (*)    HCT 35.8 (*)    All other components within normal limits  BASIC METABOLIC PANEL - Abnormal; Notable for the following components:   Potassium 3.3 (*)    Creatinine, Ser 1.09 (*)    GFR, Estimated 56 (*)    All other components within normal limits  CULTURE, BLOOD (ROUTINE X 2)  CULTURE, BLOOD (ROUTINE X 2)  LACTIC ACID, PLASMA  LACTIC ACID, PLASMA    EKG None  Radiology No results found.  Procedures Procedures    Medications Ordered in ED Medications  vancomycin (VANCOCIN) IVPB 1000 mg/200 mL premix (0 mg Intravenous Stopped 05/04/22 2231)    ED Course/ Medical Decision Making/ A&P                           Medical  Decision Making Pt here for redness and swelling of the right lower leg.  Sent here by PCP for possible DVT and IV antibiotic.  No recent injury, hx of recurrent cellulitis.    On exam, localized erythema to the lower aspect of the leg, nearly circumferential.  No  posterior calf pain.  She is currently anticoagulated.    Clinical suspicion for DVT is low.  I suspect erythema of the leg related to cellulitis vs stasis dermatitis.    Amount and/or Complexity of Data Reviewed Labs: ordered.    Details: Labs w/o significant abnormalities. No leukocytosis, lactic acid reassuring.    Discussion of management or test interpretation with external provider(s): Pt currently taking doxy from PCP.  Given IV vancomycin here and [place] unavailable after hours.  I will schedule pt to return here tomorrow for out pt.  She is agreeable to plan. Will change pt abx to clinda.   She will f/u with PCP  appropriate for d/c home  Risk Prescription drug management.           Final Clinical Impression(s) / ED Diagnoses Final diagnoses:  Cellulitis of right lower extremity    Rx / DC Orders ED Discharge Orders          Ordered    clindamycin (CLEOCIN) 150 MG capsule  3 times daily        05/04/22 2321    US Venous Img Lower Unilateral Right        05/04/22 2312              Kem Parkinson, PA-C 05/07/22 2221    Milton Ferguson, MD 05/12/22 [phone]

## 2022-05-08 ENCOUNTER — Other Ambulatory Visit: Payer: Self-pay | Admitting: Physical Medicine and Rehabilitation

## 2022-05-08 ENCOUNTER — Other Ambulatory Visit: Payer: Self-pay | Admitting: Physical Medicine & Rehabilitation

## 2022-05-09 LAB — CULTURE, BLOOD (ROUTINE X 2)
Culture: NO GROWTH
Culture: NO GROWTH
Special Requests: ADEQUATE

## 2022-05-19 ENCOUNTER — Encounter: Payer: Medicare HMO | Admitting: Nutrition

## 2022-05-28 DIAGNOSIS — I69354 Hemiplegia and hemiparesis following cerebral infarction affecting left non-dominant side: Secondary | ICD-10-CM | POA: Diagnosis not present

## 2022-05-28 DIAGNOSIS — E1122 Type 2 diabetes mellitus with diabetic chronic kidney disease: Secondary | ICD-10-CM | POA: Diagnosis not present

## 2022-05-28 DIAGNOSIS — N182 Chronic kidney disease, stage 2 (mild): Secondary | ICD-10-CM | POA: Diagnosis not present

## 2022-05-28 DIAGNOSIS — I129 Hypertensive chronic kidney disease with stage 1 through stage 4 chronic kidney disease, or unspecified chronic kidney disease: Secondary | ICD-10-CM | POA: Diagnosis not present

## 2022-06-02 ENCOUNTER — Telehealth: Payer: Self-pay | Admitting: Physical Medicine and Rehabilitation

## 2022-06-02 NOTE — Telephone Encounter (Signed)
Patient would like to make an appointment for her back but has had a stroke since last visit.Marland Kitchen Best number to reach [phone]

## 2022-06-03 NOTE — Telephone Encounter (Signed)
Spoke with patient and scheduled ov for 06/05/22

## 2022-06-05 ENCOUNTER — Ambulatory Visit: Payer: Medicare HMO | Admitting: Physical Medicine and Rehabilitation

## 2022-06-05 ENCOUNTER — Telehealth: Payer: Self-pay | Admitting: Physical Medicine and Rehabilitation

## 2022-06-05 NOTE — Telephone Encounter (Signed)
Pt called requesting a call back to reschedule appt. Please call pt later she has a funeral to attend. Pt phone number is [phone]

## 2022-06-08 ENCOUNTER — Ambulatory Visit: Payer: Self-pay | Admitting: Nutrition

## 2022-06-10 DIAGNOSIS — E119 Type 2 diabetes mellitus without complications: Secondary | ICD-10-CM | POA: Diagnosis not present

## 2022-06-10 DIAGNOSIS — Z0100 Encounter for examination of eyes and vision without abnormal findings: Secondary | ICD-10-CM | POA: Diagnosis not present

## 2022-06-17 ENCOUNTER — Other Ambulatory Visit (HOSPITAL_COMMUNITY): Payer: Self-pay | Admitting: Family Medicine

## 2022-06-17 DIAGNOSIS — R590 Localized enlarged lymph nodes: Secondary | ICD-10-CM

## 2022-07-02 ENCOUNTER — Encounter (HOSPITAL_COMMUNITY): Payer: Self-pay

## 2022-07-02 ENCOUNTER — Encounter (HOSPITAL_COMMUNITY)
Admission: RE | Admit: 2022-07-02 | Discharge: 2022-07-02 | Disposition: A | Discharge status: Home / Self Care | Payer: Medicare HMO | Source: Ambulatory Visit | Attending: Family Medicine | Admitting: Family Medicine

## 2022-07-02 DIAGNOSIS — R590 Localized enlarged lymph nodes: Secondary | ICD-10-CM | POA: Insufficient documentation

## 2022-07-30 DIAGNOSIS — M109 Gout, unspecified: Secondary | ICD-10-CM | POA: Diagnosis not present

## 2022-07-30 DIAGNOSIS — L42 Pityriasis rosea: Secondary | ICD-10-CM | POA: Diagnosis not present

## 2022-07-30 DIAGNOSIS — E1140 Type 2 diabetes mellitus with diabetic neuropathy, unspecified: Secondary | ICD-10-CM | POA: Diagnosis not present

## 2022-07-30 DIAGNOSIS — Z6841 Body Mass Index (BMI) 40.0 and over, adult: Secondary | ICD-10-CM | POA: Diagnosis not present

## 2022-07-30 DIAGNOSIS — I6930 Unspecified sequelae of cerebral infarction: Secondary | ICD-10-CM | POA: Diagnosis not present

## 2022-08-10 ENCOUNTER — Other Ambulatory Visit: Payer: Self-pay | Admitting: Physical Medicine and Rehabilitation

## 2022-09-07 DIAGNOSIS — M9904 Segmental and somatic dysfunction of sacral region: Secondary | ICD-10-CM | POA: Diagnosis not present

## 2022-09-07 DIAGNOSIS — M542 Cervicalgia: Secondary | ICD-10-CM | POA: Diagnosis not present

## 2022-09-07 DIAGNOSIS — M9903 Segmental and somatic dysfunction of lumbar region: Secondary | ICD-10-CM | POA: Diagnosis not present

## 2022-09-07 DIAGNOSIS — M9905 Segmental and somatic dysfunction of pelvic region: Secondary | ICD-10-CM | POA: Diagnosis not present

## 2022-09-07 DIAGNOSIS — M545 Low back pain, unspecified: Secondary | ICD-10-CM | POA: Diagnosis not present

## 2022-09-07 DIAGNOSIS — M9902 Segmental and somatic dysfunction of thoracic region: Secondary | ICD-10-CM | POA: Diagnosis not present

## 2022-09-07 DIAGNOSIS — M9901 Segmental and somatic dysfunction of cervical region: Secondary | ICD-10-CM | POA: Diagnosis not present

## 2022-09-24 ENCOUNTER — Encounter: Payer: Self-pay | Admitting: Radiology

## 2022-10-04 ENCOUNTER — Other Ambulatory Visit: Payer: Self-pay | Admitting: Physical Medicine and Rehabilitation

## 2022-10-21 ENCOUNTER — Other Ambulatory Visit: Payer: Self-pay | Admitting: Physical Medicine and Rehabilitation

## 2022-10-27 ENCOUNTER — Telehealth: Payer: Self-pay | Admitting: Adult Health

## 2022-10-27 NOTE — Telephone Encounter (Signed)
LVM and sent mychart msg informing pt of r/s needed for 4/4 appt- Shelby Morgan out.

## 2022-10-29 ENCOUNTER — Ambulatory Visit: Payer: Medicare HMO | Admitting: Adult Health

## 2022-12-10 ENCOUNTER — Other Ambulatory Visit: Payer: Self-pay | Admitting: Physical Medicine and Rehabilitation

## 2022-12-14 ENCOUNTER — Other Ambulatory Visit: Payer: Self-pay | Admitting: Physical Medicine and Rehabilitation

## 2022-12-26 ENCOUNTER — Other Ambulatory Visit: Payer: Self-pay | Admitting: Physical Medicine and Rehabilitation

## 2023-01-05 ENCOUNTER — Ambulatory Visit: Payer: Medicare HMO | Admitting: Neurology

## 2023-01-15 ENCOUNTER — Other Ambulatory Visit: Payer: Self-pay | Admitting: Physical Medicine and Rehabilitation

## 2023-02-16 DIAGNOSIS — I129 Hypertensive chronic kidney disease with stage 1 through stage 4 chronic kidney disease, or unspecified chronic kidney disease: Secondary | ICD-10-CM | POA: Diagnosis not present

## 2023-02-16 DIAGNOSIS — E559 Vitamin D deficiency, unspecified: Secondary | ICD-10-CM | POA: Diagnosis not present

## 2023-02-16 DIAGNOSIS — E1140 Type 2 diabetes mellitus with diabetic neuropathy, unspecified: Secondary | ICD-10-CM | POA: Diagnosis not present

## 2023-02-16 DIAGNOSIS — G9332 Myalgic encephalomyelitis/chronic fatigue syndrome: Secondary | ICD-10-CM | POA: Diagnosis not present

## 2023-02-16 DIAGNOSIS — T50905A Adverse effect of unspecified drugs, medicaments and biological substances, initial encounter: Secondary | ICD-10-CM | POA: Diagnosis not present

## 2023-02-16 DIAGNOSIS — Z6841 Body Mass Index (BMI) 40.0 and over, adult: Secondary | ICD-10-CM | POA: Diagnosis not present

## 2023-02-16 DIAGNOSIS — I6930 Unspecified sequelae of cerebral infarction: Secondary | ICD-10-CM | POA: Diagnosis not present

## 2023-02-16 DIAGNOSIS — Z0001 Encounter for general adult medical examination with abnormal findings: Secondary | ICD-10-CM | POA: Diagnosis not present

## 2023-02-16 DIAGNOSIS — D518 Other vitamin B12 deficiency anemias: Secondary | ICD-10-CM | POA: Diagnosis not present

## 2023-02-16 DIAGNOSIS — I700 Atherosclerosis of aorta: Secondary | ICD-10-CM | POA: Diagnosis not present

## 2023-02-16 DIAGNOSIS — E1122 Type 2 diabetes mellitus with diabetic chronic kidney disease: Secondary | ICD-10-CM | POA: Diagnosis not present

## 2023-02-16 DIAGNOSIS — I10 Essential (primary) hypertension: Secondary | ICD-10-CM | POA: Diagnosis not present

## 2023-02-16 DIAGNOSIS — Z1331 Encounter for screening for depression: Secondary | ICD-10-CM | POA: Diagnosis not present

## 2023-02-16 DIAGNOSIS — N182 Chronic kidney disease, stage 2 (mild): Secondary | ICD-10-CM | POA: Diagnosis not present

## 2023-03-30 ENCOUNTER — Ambulatory Visit: Payer: Medicare HMO | Admitting: Neurology

## 2023-03-30 ENCOUNTER — Encounter: Payer: Self-pay | Admitting: Neurology

## 2023-05-18 DIAGNOSIS — M5030 Other cervical disc degeneration, unspecified cervical region: Secondary | ICD-10-CM | POA: Diagnosis not present

## 2023-05-18 DIAGNOSIS — I10 Essential (primary) hypertension: Secondary | ICD-10-CM | POA: Diagnosis not present

## 2023-05-18 DIAGNOSIS — I6930 Unspecified sequelae of cerebral infarction: Secondary | ICD-10-CM | POA: Diagnosis not present

## 2023-05-18 DIAGNOSIS — I129 Hypertensive chronic kidney disease with stage 1 through stage 4 chronic kidney disease, or unspecified chronic kidney disease: Secondary | ICD-10-CM | POA: Diagnosis not present

## 2023-05-18 DIAGNOSIS — N182 Chronic kidney disease, stage 2 (mild): Secondary | ICD-10-CM | POA: Diagnosis not present

## 2023-05-18 DIAGNOSIS — E1140 Type 2 diabetes mellitus with diabetic neuropathy, unspecified: Secondary | ICD-10-CM | POA: Diagnosis not present

## 2023-05-18 DIAGNOSIS — I700 Atherosclerosis of aorta: Secondary | ICD-10-CM | POA: Diagnosis not present

## 2023-05-18 DIAGNOSIS — Z23 Encounter for immunization: Secondary | ICD-10-CM | POA: Diagnosis not present

## 2023-05-18 DIAGNOSIS — E1122 Type 2 diabetes mellitus with diabetic chronic kidney disease: Secondary | ICD-10-CM | POA: Diagnosis not present

## 2023-05-18 DIAGNOSIS — Z6841 Body Mass Index (BMI) 40.0 and over, adult: Secondary | ICD-10-CM | POA: Diagnosis not present

## 2023-06-05 ENCOUNTER — Other Ambulatory Visit: Payer: Self-pay | Admitting: Neurology

## 2023-06-07 ENCOUNTER — Other Ambulatory Visit (HOSPITAL_COMMUNITY): Payer: Self-pay | Admitting: Internal Medicine

## 2023-06-07 DIAGNOSIS — Z1231 Encounter for screening mammogram for malignant neoplasm of breast: Secondary | ICD-10-CM

## 2023-06-09 ENCOUNTER — Other Ambulatory Visit: Payer: Self-pay

## 2023-06-09 MED ORDER — GABAPENTIN 300 MG PO CAPS: 300.0000 mg | ORAL_CAPSULE | Freq: Three times a day (TID) | ORAL | 0 refills | Status: AC

## 2023-06-16 ENCOUNTER — Encounter (HOSPITAL_COMMUNITY): Payer: Self-pay

## 2023-06-16 ENCOUNTER — Ambulatory Visit (HOSPITAL_COMMUNITY)
Admission: RE | Admit: 2023-06-16 | Discharge: 2023-06-16 | Disposition: A | Discharge status: Home / Self Care | Payer: Medicare HMO | Source: Ambulatory Visit | Attending: Internal Medicine | Admitting: Internal Medicine

## 2023-06-16 DIAGNOSIS — Z1231 Encounter for screening mammogram for malignant neoplasm of breast: Secondary | ICD-10-CM | POA: Insufficient documentation

## 2023-06-29 DIAGNOSIS — N182 Chronic kidney disease, stage 2 (mild): Secondary | ICD-10-CM | POA: Diagnosis not present

## 2023-09-01 ENCOUNTER — Other Ambulatory Visit: Payer: Self-pay | Admitting: Physical Medicine and Rehabilitation

## 2023-09-26 IMAGING — CT CT ANGIO HEAD-NECK (W OR W/O PERF)
1 of 11 series · 5 of 33 positions shown · non-contrast
Comparison: Prior MRI from 01/06/2022 as well as prior CTA from
04/10/2021 and chest CT from 06/06/2018.

CLINICAL DATA: Follow-up examination for acute stroke.

EXAM:
CT ANGIOGRAPHY HEAD AND NECK
TECHNIQUE: Multidetector CT imaging of the head and neck was performed using
the standard protocol during bolus administration of intravenous
contrast. Multiplanar CT image reconstructions and MIPs were
obtained to evaluate the vascular anatomy. Carotid stenosis
measurements (when applicable) are obtained utilizing NASCET
criteria, using the distal internal carotid diameter as the
denominator.

[Series 11: cta neck axial · axial · 0.39mm/px · z∈[-264,-52]mm · 5 of 320 slices shown]
[im 54/320  soft-tissue]
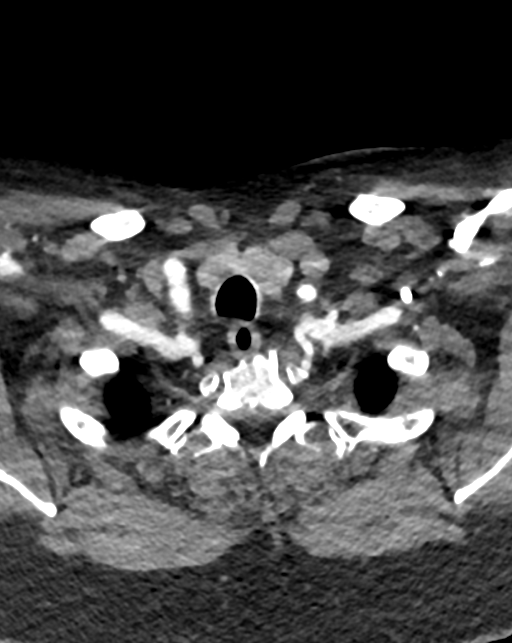
[im 107/320  bone]
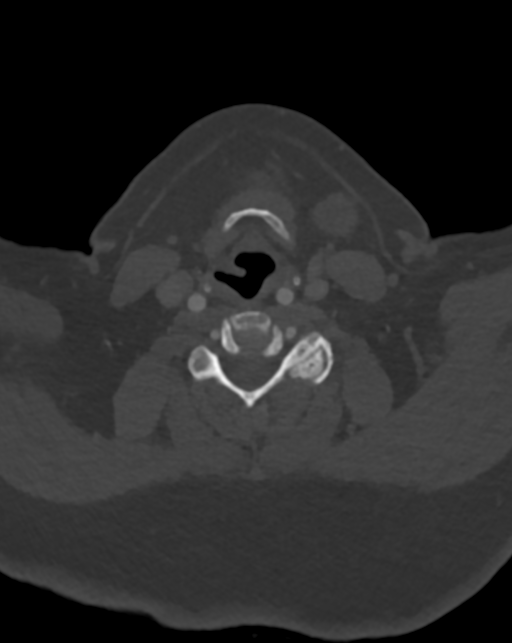
[im 160/320  soft-tissue]
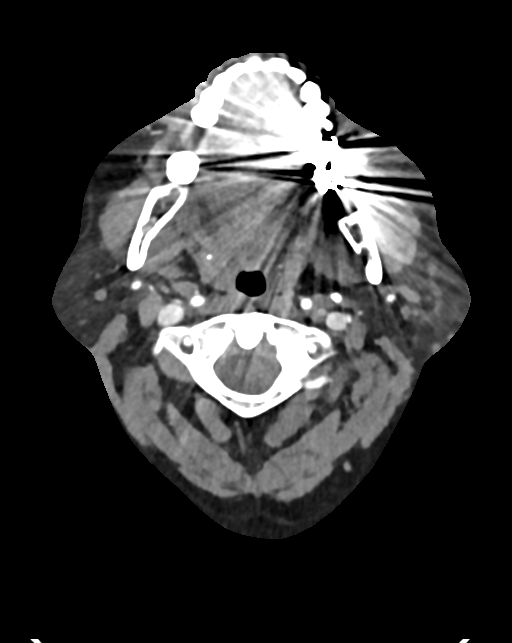
[im 213/320  bone]
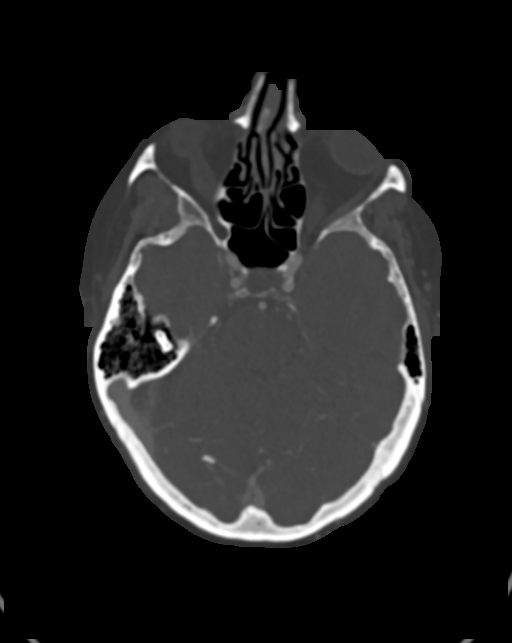
[im 266/320  soft-tissue]
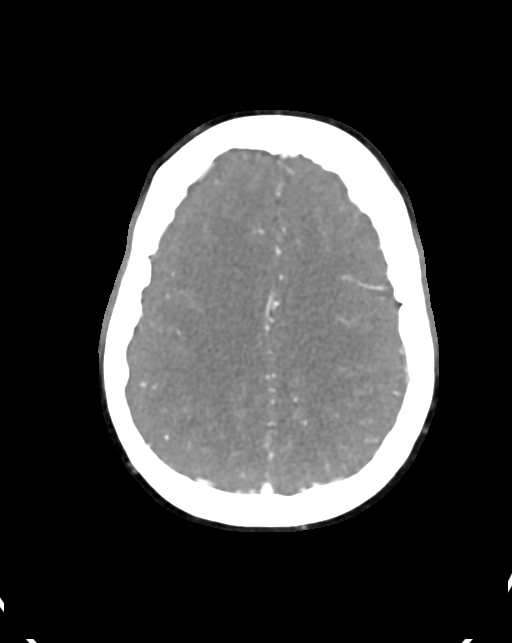

[5 of 33 positions shown; findings below may reference images not displayed]

RADIATION DOSE REDUCTION: This exam was performed according to the
departmental dose-optimization program which includes automated
exposure control, adjustment of the mA and/or kV according to
patient size and/or use of iterative reconstruction technique.

CONTRAST:  75mL OMNIPAQUE IOHEXOL 350 MG/ML SOLN
FINDINGS: CT HEAD FINDINGS

Brain: There has been continued interval evolution of previously
identified acute infarct involving the right thalamic capsular
region, stable in size as compared to previous MRI. No associated
mass effect or evidence for hemorrhagic transformation.

No other acute intracranial hemorrhage or large vessel territory
infarct. Underlying atrophy with mild chronic small vessel ischemic
disease noted. Small remote left thalamic lacunar infarct noted. No
mass lesion or midline shift. No hydrocephalus or extra-axial fluid
collection.

Vascular: No hyperdense vessel. Scattered vascular calcifications
noted within the carotid siphons.

Skull: Scalp soft tissues and calvarium within normal limits.

Sinuses: Paranasal sinuses are largely clear.  No mastoid effusion.

Orbits: Globes orbital soft tissues demonstrate no acute finding.

Review of the MIP images confirms the above findings

CTA NECK FINDINGS

Aortic arch: Visualized aortic arch normal in caliber with standard
3 vessel branching pattern. Mild plaque within the arch itself. No
stenosis about the origin of the great vessels.

Right carotid system: Right common and internal carotid arteries
patent without stenosis or dissection. Mild for age plaque about the
right carotid bulb without stenosis.

Left carotid system: Left common and internal carotid arteries
patent without stenosis or dissection. Mild for age plaque about the
left carotid bulb without significant stenosis.

Vertebral arteries: Both vertebral arteries arise from the
subclavian arteries. No proximal subclavian artery stenosis. Both
vertebral arteries widely patent without stenosis, dissection or
occlusion.

Skeleton: No discrete or worrisome osseous lesions. Segmental fusion
of the C3 and C4 vertebral bodies noted. Moderate spondylosis noted
at C5-6 and C6-7.

Other neck: No other acute soft tissue abnormality within the neck.
8 mm soft tissue nodule present at the superior aspect left parotid
gland, indeterminate. Few additional scattered subcentimeter nodular
densities about the bilateral parotid glands favored to reflect
intraparotid lymph nodes. 9 mm left thyroid nodule, of doubtful
significance given size and patient age, no follow-up imaging
recommended (ref: [HOSPITAL]. [DATE]): 143-50).

Upper chest: Visualized upper chest demonstrates no acute finding.
Irregular soft tissue density partially visualized at the level of
the AP window (series 5, image 161), likely adenopathy as seen on
prior studies. This appears somewhat increased in size measuring up
to 2.5 cm (previously 1.9 cm on prior chest CT from 06/06/2018.

Review of the MIP images confirms the above findings

CTA HEAD FINDINGS

Anterior circulation: Petrous segments patent bilaterally.
Atheromatous change within the carotid siphons without
hemodynamically significant stenosis. A1 segments, anterior
communicating artery complex common anterior cerebral arteries
patent without stenosis. No M1 stenosis or occlusion. No proximal
MCA branch occlusion. Distal left MCA branches widely patent. There
is a focal severe proximal right M3 stenosis (series 13, image 76).
Right MCA branches otherwise patent and well perfused.

Posterior circulation: Both V4 segments patent without significant
stenosis. Left PICA patent. Right PICA origin not well seen. Basilar
patent to its distal aspect without stenosis superior cerebellar
arteries patent proximally. Both PCAs primarily supplied via the
basilar. Focal moderate proximal left P2 stenosis (series 12, image
117). Additional severe distal left P3 stenoses (series 12, image
137). Left PCA attenuated but patent distally. On the right, there
is a focal severe right P2 stenosis (series 12, image 115) right PCA
mildly irregular but otherwise patent to its distal aspect.

Venous sinuses: Patent allowing for timing the contrast bolus.

Anatomic variants: None significant.  No aneurysm.

Review of the MIP images confirms the above findings
IMPRESSION: CT HEAD IMPRESSION:

1. Normal expected interval evolution of acute right thalamic
infarct, stable in size relative to previous MRI. No evidence for
hemorrhagic transformation or other complication.
2. No other new acute intracranial abnormality.
3. Underlying atrophy with chronic small vessel ischemic disease.

CTA HEAD AND NECK:

1. Negative CTA for large vessel occlusion.
2. Intracranial atherosclerotic disease with multifocal moderate to
severe right M3, bilateral P2, and distal left P3 stenoses as above.
No proximal high-grade or correctable stenosis. Overall, appearance
is similar as compared to prior CTA from 04/10/2021.
3. Wide patency of the major arterial vasculature within the neck.
4. Mediastinal adenopathy, partially visualized and incompletely
assessed on this exam. Further evaluation with dedicated
cross-sectional imaging of the chest suggested for complete
evaluation.
5. 8 mm nodular lesion within the superior left parotid gland,
indeterminate. While this may reflect an intraparotid lymph node, a
possible small primary salivary neoplasm could also have this
appearance. Nonemergent outpatient ENT referral for further workup
suggested.

## 2023-10-12 ENCOUNTER — Telehealth: Payer: Self-pay | Admitting: Physical Medicine and Rehabilitation

## 2023-10-12 NOTE — Telephone Encounter (Signed)
 Pt called requesting an appt with Dr. Alvester Morin in lower back.

## 2023-10-18 ENCOUNTER — Telehealth: Payer: Self-pay | Admitting: Physical Medicine and Rehabilitation

## 2023-10-18 NOTE — Telephone Encounter (Signed)
 Patient called and wants to reschedule. CB#[phone]

## 2023-10-19 ENCOUNTER — Ambulatory Visit: Admitting: Physical Medicine and Rehabilitation

## 2023-10-23 ENCOUNTER — Other Ambulatory Visit: Payer: Self-pay | Admitting: Physical Medicine and Rehabilitation

## 2023-11-09 ENCOUNTER — Ambulatory Visit: Admitting: Physical Medicine and Rehabilitation

## 2023-11-11 ENCOUNTER — Ambulatory Visit: Admitting: Physical Medicine and Rehabilitation

## 2023-11-11 ENCOUNTER — Encounter: Payer: Self-pay | Admitting: Physical Medicine and Rehabilitation

## 2023-11-11 DIAGNOSIS — M48062 Spinal stenosis, lumbar region with neurogenic claudication: Secondary | ICD-10-CM | POA: Diagnosis not present

## 2023-11-11 DIAGNOSIS — M5416 Radiculopathy, lumbar region: Secondary | ICD-10-CM | POA: Diagnosis not present

## 2023-11-11 DIAGNOSIS — M5441 Lumbago with sciatica, right side: Secondary | ICD-10-CM | POA: Diagnosis not present

## 2023-11-11 DIAGNOSIS — G8929 Other chronic pain: Secondary | ICD-10-CM

## 2023-11-11 NOTE — Progress Notes (Signed)
 Shelby Morgan - 68 y.o. female MRN [id number]  Date of birth: [birth year]  Office Visit Note: Visit Date: 11/11/2023 PCP: Kathyleen Parkins, MD Referred by: Kathyleen Parkins, MD  Subjective: Chief Complaint  Patient presents with   Lower Back - Pain   HPI: Shelby Morgan is a 68 y.o. female who comes in today for evaluation of chronic, worsening and severe bilateral lower back pain radiating to right buttock. Pain ongoing for several years, worsens with standing and walking. She describes pain as catching and pinching sensation, currently rates as 8 out of 10. Some relief of pain with home exercise regimen, rest and use of medications. History of formal chiropractic treatments with minimal relief of pain. Lumbar MRI imaging from 2022 shows severe spinal stenosis and severe right neural foraminal stenosis at L2-L3. There is also grade 2 anterolisthesis at L5-S1 due to L5 pars defects with moderate right and severe left neural foraminal stenosis. Patient has undergone multiple lumbar epidural steroid injections in our office over the years with significant relief of pain. Patient denies recent trauma or falls. Patient is currently using cane to assist with ambulation.   She is currently helping to care for her elderly mother. History of CVA in 2023, left sided residual and thalamic syndrome.     Review of Systems  Musculoskeletal:  Positive for back pain.  Neurological:  Negative for tingling, sensory change, focal weakness and weakness.  All other systems reviewed and are negative.  Otherwise per HPI.  Assessment & Plan: Visit Diagnoses:    ICD-10-CM   1. Chronic bilateral low back pain with right-sided sciatica  G89.29 Ambulatory referral to Physical Medicine Rehab   M54.41     2. Radiculopathy, lumbar region  M54.16 Ambulatory referral to Physical Medicine Rehab    3. Spinal stenosis of lumbar region with neurogenic claudication  M48.062 Ambulatory referral to Physical Medicine Rehab        Plan: Findings:  Chronic, worsening and severe bilateral lower back pain radiating to right buttock. Patient continues to have severe pain despite good conservative therapies such as chiropractic treatments, home exercise regimen, rest and use of medications. Patients clinical presentation and exam are consistent with neurogenic claudication as a result of spinal canal stenosis.  There is severe spinal stenosis and severe right neural foraminal stenosis at L2-L3.  We discussed treatment plan in detail today.  She did well in the past with intermittent lumbar epidural steroid injections.  Next step is to perform diagnostic and hopefully therapeutic right L3-L4 interlaminar epidural steroid injection under fluoroscopic guidance.  She is currently taking Coumadin , we will need permission for her to come off this medicine before we proceed with injection.  If good relief of pain with injection we can repeat this procedure infrequently as needed.  I discussed injection procedure in detail today, she has no questions at this time.  Also discussed possible surgical referral to our spine surgeon Dr. Colette Davies.  I would like to get Dr. Frieda Jew opinion regarding surgical intervention as she does have severe spinal stenosis at the level of L2-L3.  We will hold on surgical referral at this time and see how she does with injection.  I encouraged patient to remain active as tolerated.  No red flag symptoms noted upon exam today.    Meds & Orders: No orders of the defined types were placed in this encounter.   Orders Placed This Encounter  Procedures   Ambulatory referral to Physical Medicine Rehab  Follow-up: Return for Right L3-L4 interlaminar epidural steroid injection.   Procedures: No procedures performed      Clinical History: Narrative & Impression CLINICAL DATA:  Low back pain and right lower extremity radiculopathy for 2 months.   EXAM: MRI LUMBAR SPINE WITHOUT CONTRAST    TECHNIQUE: Multiplanar, multisequence MR imaging of the lumbar spine was performed. No intravenous contrast was administered.   COMPARISON:  Lumbar spine radiographs 09/25/2020   FINDINGS: Segmentation:  5 lumbar vertebrae.  Hypoplastic ribs at T12.   Alignment: Mild lumbar levoscoliosis. Trace retrolisthesis of L2 on L3. Bilateral L5 pars defects with 8 mm anterolisthesis of L5 on S1.   Vertebrae: No fracture or suspicious osseous lesion. Prominent degenerative endplate changes asymmetrically greater on the right at L1-2 and L2-3 including degenerative edema at L2-3.   Conus medullaris and cauda equina: Conus extends to the L1 level. Conus and cauda equina appear normal.   Paraspinal and other soft tissues: Unremarkable.   Disc levels:   Disc desiccation throughout the lumbar spine. Moderate to severe disc space narrowing at L1-2, L2-3, and L5-S1 with moderate narrowing at L3-4 and mild-to-moderate narrowing at L4-5.   T12-L1: Negative.   L1-2: Right eccentric disc bulging, endplate spurring, and mild facet hypertrophy result in mild right lateral recess stenosis and mild right neural foraminal stenosis without significant spinal stenosis.   L2-3: Prominent circumferential disc bulging eccentric to the right, endplate spurring, prominent dorsal epidural fat, and severe facet and ligamentum flavum hypertrophy result in severe spinal stenosis and severe right and mild-to-moderate left neural foraminal stenosis.   L3-4: Prominent circumferential disc bulging eccentric to the right, endplate spurring, and severe facet and ligamentum flavum hypertrophy result in mild spinal stenosis and moderate right neural foraminal stenosis.   L4-5: Disc bulging eccentric to the left and moderate facet and ligamentum flavum hypertrophy result in mild bilateral lateral recess stenosis and mild right and moderate left neural foraminal stenosis without spinal stenosis.   L5-S1:  Anterolisthesis with bulging uncovered disc, endplate spurring, and spurring at the pars defects result in moderate right and severe left neural foraminal stenosis without spinal stenosis.   IMPRESSION: 1. 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 2. Severe spinal stenosis and severe right neural foraminal stenosis at L2-3. 3. Grade 2 anterolisthesis at L5-S1 due to L5 pars defects with moderate right and severe left neural foraminal stenosis. 4. Mild spinal stenosis at L3-4.     Electronically Signed   By: Aundra Lee M.D.   On: 11/01/2020 16:55   She reports that she quit smoking about 30 years ago. Her smoking use included cigarettes. She started smoking about 41 years ago. She has a 5.5 pack-year smoking history. She has never used smokeless tobacco. No results for input(s): "HGBA1C", "LABURIC" in the last 8760 hours.  Objective:  VS:  HT:    WT:   BMI:     BP:   HR: bpm  TEMP: ( )  RESP:  Physical Exam Vitals and nursing note reviewed.  HENT:     Head: Normocephalic and atraumatic.     Right Ear: External ear normal.     Left Ear: External ear normal.     Nose: Nose normal.     Mouth/Throat:     Mouth: Mucous membranes are moist.  Eyes:     Extraocular Movements: Extraocular movements intact.  Cardiovascular:     Rate and Rhythm: Normal rate.     Pulses: Normal pulses.  Pulmonary:  Effort: Pulmonary effort is normal.  Abdominal:     General: Abdomen is flat. There is no distension.  Musculoskeletal:        General: Tenderness present.     Cervical back: Normal range of motion.     Comments: Patient rises from seated position to standing without difficulty. Good lumbar range of motion. No pain noted with facet loading. 5/5 strength noted with bilateral hip flexion, knee flexion/extension, ankle dorsiflexion/plantarflexion and EHL. No clonus noted bilaterally. No pain upon palpation of greater trochanters. No pain with internal/external rotation of  bilateral hips. Sensation intact bilaterally. Negative slump test bilaterally. Ambulates with cane, gait slow and unsteady.    Skin:    General: Skin is warm and dry.     Capillary Refill: Capillary refill takes less than 2 seconds.  Neurological:     General: No focal deficit present.     Mental Status: She is alert and oriented to person, place, and time.  Psychiatric:        Mood and Affect: Mood normal.        Behavior: Behavior normal.     Ortho Exam  Imaging: No results found.  Past Medical/Family/Surgical/Social History: Medications & Allergies reviewed per EMR, new medications updated. 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 01/29/2022   Gout flare 01/29/2022   Leukocytosis 01/29/2022   AKI (acute kidney injury) (HCC)    History of CVA (cerebrovascular accident) 04/10/2021   History of melanoma 06/20/2018   Chest pain 06/06/2018   Primary hypertension 06/06/2018   Hyperlipidemia associated with type 2 diabetes mellitus (HCC) 06/06/2018   Mediastinal adenopathy 06/06/2018   Elevated LFTs 05/02/2012   Lactic acidosis 05/02/2012   Cellulitis of right leg 05/01/2012   Type 2 diabetes mellitus with obesity (HCC) 05/01/2012   Hypokalemia 05/01/2012   Hyponatremia 05/01/2012   NAFLD (nonalcoholic fatty liver disease) 47/82/9562   Obesity 05/14/2011   Hemorrhoids 05/14/2011   Transaminitis 01/29/2011   Hepatomegaly 01/29/2011   Rectal bleed 01/29/2011   Dysphagia 01/29/2011   RUQ pain 01/29/2011   Hypercalcemia 01/29/2011   Anxiety 01/29/2011   GERD  (gastroesophageal reflux disease) 01/29/2011   Past Medical History:  Diagnosis Date   Anxiety    Bell's palsy 01/08/2011   Chronic back pain    Depression    Diabetes mellitus    Diverticulosis    GERD (gastroesophageal reflux disease)    Hemorrhoids    Hepatomegaly    Hypertension    Lumbar stenosis    L2/L3--followed by Dr. Daisey Dryer   Melanoma [facility]) 2009   Dr Del Favia, Stage 2, required only surgery   NAFLD (nonalcoholic fatty liver disease)    Right thalamic stroke (HCC) 01/14/2022   Skin cancer    Stroke (HCC) 07/2021   Family History  Problem Relation Age of Onset   Heart disease Mother    Heart disease Maternal Grandmother    Heart disease Maternal Grandfather    COPD Father    Bladder Cancer Father    Anesthesia problems Neg Hx    Hypotension Neg Hx    Malignant hyperthermia Neg Hx  Pseudochol deficiency Neg Hx    Colon cancer Neg Hx    Past Surgical History:  Procedure Laterality Date   CESAREAN SECTION     COLONOSCOPY  03/09/2011   Dr Nolene Baumgarten diverticulosis, internal and external hemorrhoids   ESOPHAGOGASTRODUODENOSCOPY  03/09/2011   Esophageal stricture dilated to 16 MM savory, NSAID- induced gastritis and duodenitis   LEG SURGERY     plates/screws/hx fx-right leg   MELANOMA EXCISION     right knee   SAVORY DILATION  03/09/2011   Social History   Occupational History   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

## 2023-11-11 NOTE — Progress Notes (Signed)
 Pain Scale   Average Pain 8 Patient advising she had a CVA x 2 years ago, but had chronic back pain , now that she has got to her full recovery potential she is wishing to be evaluated for injection or surgery.        +Driver, -BT, -Dye Allergies.

## 2023-12-06 ENCOUNTER — Other Ambulatory Visit: Payer: Self-pay

## 2023-12-06 ENCOUNTER — Ambulatory Visit: Admitting: Physical Medicine and Rehabilitation

## 2023-12-06 VITALS — BP 132/80 | HR 76

## 2023-12-06 DIAGNOSIS — M5416 Radiculopathy, lumbar region: Secondary | ICD-10-CM | POA: Diagnosis not present

## 2023-12-06 MED ORDER — METHYLPREDNISOLONE ACETATE 40 MG/ML IJ SUSP
40.0000 mg | Freq: Once | INTRAMUSCULAR | Status: AC
  Administered 2023-12-06: 40 mg

## 2023-12-06 NOTE — Patient Instructions (Signed)

## 2023-12-06 NOTE — Progress Notes (Signed)
 Shelby Morgan - 68 y.o. female MRN [id number]  Date of birth: [birth year]  Office Visit Note: Visit Date: 12/06/2023 PCP: Shelby Parkins, MD Referred by: Shelby Parkins, MD  Subjective: Chief Complaint  Patient presents with   Lower Back - Pain   HPI:  Shelby Morgan is a 68 y.o. female who comes in today at the request of Shelby Hamel, FNP for planned Right L3-4 Lumbar Interlaminar epidural steroid injection with fluoroscopic guidance.  The patient has failed conservative care including home exercise, medications, time and activity modification.  This injection will be diagnostic and hopefully therapeutic.  Please see requesting physician notes for further details and justification. Consider updated MRI lumbar spine.   ROS Otherwise per HPI.  Assessment & Plan: Visit Diagnoses:    ICD-10-CM   1. Radiculopathy, lumbar region  M54.16 XR C-ARM NO REPORT    Epidural Steroid injection    methylPREDNISolone  acetate (DEPO-MEDROL ) injection 40 mg      Plan: No additional findings.   Meds & Orders:  Meds ordered this encounter  Medications   methylPREDNISolone  acetate (DEPO-MEDROL ) injection 40 mg    Orders Placed This Encounter  Procedures   XR C-ARM NO REPORT   Epidural Steroid injection    Follow-up: Return if symptoms worsen or fail to improve.   Procedures: No procedures performed  Lumbar Epidural Steroid Injection - Interlaminar Approach with Fluoroscopic Guidance  Patient: Shelby Morgan      Date of Birth: 1956/04/21 MRN: [id number] PCP: Shelby Parkins, MD      Visit Date: 12/06/2023   Universal Protocol:     Consent Given By: the patient  Position: PRONE  Additional Comments: Vital signs were monitored before and after the procedure. Patient was prepped and draped in the usual sterile fashion. The correct patient, procedure, and site was verified.   Injection Procedure Details:   Procedure diagnoses: Radiculopathy, lumbar region [M54.16]   Meds  Administered:  Meds ordered this encounter  Medications   methylPREDNISolone  acetate (DEPO-MEDROL ) injection 40 mg     Laterality: Right  Location/Site:  L3-4  Needle: 3.5 in., 20 ga. Tuohy  Needle Placement: Paramedian epidural  Findings:   -Comments: Excellent flow of contrast into the epidural space.  Procedure Details: Using a paramedian approach from the side mentioned above, the region overlying the inferior lamina was localized under fluoroscopic visualization and the soft tissues overlying this structure were infiltrated with 4 ml. of 1% Lidocaine  without Epinephrine. The Tuohy needle was inserted into the epidural space using a paramedian approach.   The epidural space was localized using loss of resistance along with counter oblique bi-planar fluoroscopic views.  After negative aspirate for air, blood, and CSF, a 2 ml. volume of Isovue -250 was injected into the epidural space and the flow of contrast was observed. Radiographs were obtained for documentation purposes.    The injectate was administered into the level noted above.   Additional Comments:  The patient tolerated the procedure well Dressing: 2 x 2 sterile gauze and Band-Aid    Post-procedure details: Patient was observed during the procedure. Post-procedure instructions were reviewed.  Patient left the clinic in stable condition.   Clinical History: Narrative & Impression CLINICAL DATA:  Low back pain and right lower extremity radiculopathy for 2 months.   EXAM: MRI LUMBAR SPINE WITHOUT CONTRAST   TECHNIQUE: Multiplanar, multisequence MR imaging of the lumbar spine was performed. No intravenous contrast was administered.   COMPARISON:  Lumbar spine radiographs 09/25/2020   FINDINGS:  Segmentation:  5 lumbar vertebrae.  Hypoplastic ribs at T12.   Alignment: Mild lumbar levoscoliosis. Trace retrolisthesis of L2 on L3. Bilateral L5 pars defects with 8 mm anterolisthesis of L5 on S1.   Vertebrae:  No fracture or suspicious osseous lesion. Prominent degenerative endplate changes asymmetrically greater on the right at L1-2 and L2-3 including degenerative edema at L2-3.   Conus medullaris and cauda equina: Conus extends to the L1 level. Conus and cauda equina appear normal.   Paraspinal and other soft tissues: Unremarkable.   Disc levels:   Disc desiccation throughout the lumbar spine. Moderate to severe disc space narrowing at L1-2, L2-3, and L5-S1 with moderate narrowing at L3-4 and mild-to-moderate narrowing at L4-5.   T12-L1: Negative.   L1-2: Right eccentric disc bulging, endplate spurring, and mild facet hypertrophy result in mild right lateral recess stenosis and mild right neural foraminal stenosis without significant spinal stenosis.   L2-3: Prominent circumferential disc bulging eccentric to the right, endplate spurring, prominent dorsal epidural fat, and severe facet and ligamentum flavum hypertrophy result in severe spinal stenosis and severe right and mild-to-moderate left neural foraminal stenosis.   L3-4: Prominent circumferential disc bulging eccentric to the right, endplate spurring, and severe facet and ligamentum flavum hypertrophy result in mild spinal stenosis and moderate right neural foraminal stenosis.   L4-5: Disc bulging eccentric to the left and moderate facet and ligamentum flavum hypertrophy result in mild bilateral lateral recess stenosis and mild right and moderate left neural foraminal stenosis without spinal stenosis.   L5-S1: Anterolisthesis with bulging uncovered disc, endplate spurring, and spurring at the pars defects result in moderate right and severe left neural foraminal stenosis without spinal stenosis.   IMPRESSION: 1. 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 2. Severe spinal stenosis and severe right neural foraminal stenosis at L2-3. 3. Grade 2 anterolisthesis at L5-S1 due to L5 pars defects with moderate  right and severe left neural foraminal stenosis. 4. Mild spinal stenosis at L3-4.     Electronically Signed   By: Shelby Morgan M.D.   On: 11/01/2020 16:55     Objective:  VS:  HT:    WT:   BMI:     BP:132/80  HR:76bpm  TEMP: ( )  RESP:  Physical Exam Vitals and nursing note reviewed.  Constitutional:      General: She is not in acute distress.    Appearance: Normal appearance. She is obese. She is not ill-appearing.  HENT:     Head: Normocephalic and atraumatic.     Right Ear: External ear normal.     Left Ear: External ear normal.  Eyes:     Extraocular Movements: Extraocular movements intact.  Cardiovascular:     Rate and Rhythm: Normal rate.     Pulses: Normal pulses.  Pulmonary:     Effort: Pulmonary effort is normal. No respiratory distress.  Abdominal:     General: There is no distension.     Palpations: Abdomen is soft.  Musculoskeletal:        General: Tenderness present.     Cervical back: Neck supple.     Right lower leg: No edema.     Left lower leg: No edema.     Comments: Patient has good right distal strength with no pain over the greater trochanters.  No clonus or focal weakness. Left hemparesis.  Skin:    Findings: No erythema, lesion or rash.  Neurological:     General: No focal deficit present.  Mental Status: She is alert and oriented to person, place, and time.     Sensory: No sensory deficit.     Motor: Weakness present. No abnormal muscle tone.     Coordination: Coordination normal.     Gait: Gait abnormal.  Psychiatric:        Mood and Affect: Mood normal.        Behavior: Behavior normal.      Imaging: XR C-ARM NO REPORT Result Date: 12/06/2023 Please see Notes tab for imaging impression.

## 2023-12-06 NOTE — Procedures (Signed)
 Lumbar Epidural Steroid Injection - Interlaminar Approach with Fluoroscopic Guidance  Patient: Shelby Morgan      Date of Birth: 01-Jan-1956 MRN: [id number] PCP: Kathyleen Parkins, MD      Visit Date: 12/06/2023   Universal Protocol:     Consent Given By: the patient  Position: PRONE  Additional Comments: Vital signs were monitored before and after the procedure. Patient was prepped and draped in the usual sterile fashion. The correct patient, procedure, and site was verified.   Injection Procedure Details:   Procedure diagnoses: Radiculopathy, lumbar region [M54.16]   Meds Administered:  Meds ordered this encounter  Medications   methylPREDNISolone  acetate (DEPO-MEDROL ) injection 40 mg     Laterality: Right  Location/Site:  L3-4  Needle: 3.5 in., 20 ga. Tuohy  Needle Placement: Paramedian epidural  Findings:   -Comments: Excellent flow of contrast into the epidural space.  Procedure Details: Using a paramedian approach from the side mentioned above, the region overlying the inferior lamina was localized under fluoroscopic visualization and the soft tissues overlying this structure were infiltrated with 4 ml. of 1% Lidocaine  without Epinephrine. The Tuohy needle was inserted into the epidural space using a paramedian approach.   The epidural space was localized using loss of resistance along with counter oblique bi-planar fluoroscopic views.  After negative aspirate for air, blood, and CSF, a 2 ml. volume of Isovue -250 was injected into the epidural space and the flow of contrast was observed. Radiographs were obtained for documentation purposes.    The injectate was administered into the level noted above.   Additional Comments:  The patient tolerated the procedure well Dressing: 2 x 2 sterile gauze and Band-Aid    Post-procedure details: Patient was observed during the procedure. Post-procedure instructions were reviewed.  Patient left the clinic in stable  condition.

## 2023-12-06 NOTE — Progress Notes (Signed)
 Pain Scale   Average Pain 8 Patient advising her lower back pain is constant without relief, patient advising she does use ICE to help but without relief. Pt stopped taking blood thinners on 11/28/23        +Driver, -BT, -Dye Allergies.

## 2024-05-29 ENCOUNTER — Encounter: Payer: Self-pay | Admitting: Radiology

## 2024-06-20 ENCOUNTER — Encounter: Payer: Self-pay | Admitting: *Deleted

## 2024-06-20 NOTE — Progress Notes (Signed)
 Shelby Morgan                                          MRN: [id number]   06/20/2024   The [facility] Quality Team Specialist reviewed this patient medical record for the purposes of chart review for care gap closure. The following were reviewed: chart review for care gap closure-kidney health evaluation for diabetes:eGFR  and uACR.    [facility] Quality Team

## 2024-06-27 ENCOUNTER — Other Ambulatory Visit: Payer: Self-pay | Admitting: Neurology

## 2024-06-29 NOTE — Telephone Encounter (Signed)
 Last seen on 04/29/22 Follow up scheduled 09/25/24

## 2024-09-25 ENCOUNTER — Institutional Professional Consult (permissible substitution): Admitting: Neurology
